# Patient Record
Sex: Male | Born: 1955 | Race: White | Hispanic: No | Marital: Single | Smoking: Never smoker
Health system: Southern US, Community
[De-identification: ages and names within clinical notes are randomized; demographics above are authoritative.]

## PROBLEM LIST (undated history)

## (undated) DIAGNOSIS — M109 Gout, unspecified: Secondary | ICD-10-CM

## (undated) DIAGNOSIS — E782 Mixed hyperlipidemia: Secondary | ICD-10-CM

## (undated) DIAGNOSIS — T7840XA Allergy, unspecified, initial encounter: Secondary | ICD-10-CM

## (undated) DIAGNOSIS — K759 Inflammatory liver disease, unspecified: Secondary | ICD-10-CM

## (undated) DIAGNOSIS — K219 Gastro-esophageal reflux disease without esophagitis: Secondary | ICD-10-CM

## (undated) DIAGNOSIS — H269 Unspecified cataract: Secondary | ICD-10-CM

## (undated) DIAGNOSIS — E876 Hypokalemia: Secondary | ICD-10-CM

## (undated) DIAGNOSIS — E559 Vitamin D deficiency, unspecified: Secondary | ICD-10-CM

## (undated) DIAGNOSIS — I10 Essential (primary) hypertension: Secondary | ICD-10-CM

## (undated) DIAGNOSIS — E119 Type 2 diabetes mellitus without complications: Secondary | ICD-10-CM

## (undated) DIAGNOSIS — Q5320 Undescended testicle, unspecified, bilateral: Secondary | ICD-10-CM

## (undated) DIAGNOSIS — Q613 Polycystic kidney, unspecified: Secondary | ICD-10-CM

## (undated) DIAGNOSIS — C801 Malignant (primary) neoplasm, unspecified: Secondary | ICD-10-CM

## (undated) DIAGNOSIS — I499 Cardiac arrhythmia, unspecified: Secondary | ICD-10-CM

## (undated) DIAGNOSIS — Q446 Cystic disease of liver: Secondary | ICD-10-CM

## (undated) HISTORY — DX: Essential (primary) hypertension: I10

## (undated) HISTORY — DX: Mixed hyperlipidemia: E78.2

## (undated) HISTORY — DX: Gastro-esophageal reflux disease without esophagitis: K21.9

## (undated) HISTORY — DX: Allergy, unspecified, initial encounter: T78.40XA

## (undated) HISTORY — PX: OTHER SURGICAL HISTORY: SHX169

## (undated) HISTORY — DX: Gout, unspecified: M10.9

## (undated) HISTORY — DX: Type 2 diabetes mellitus without complications: E11.9

## (undated) HISTORY — DX: Hypokalemia: E87.6

## (undated) HISTORY — DX: Vitamin D deficiency, unspecified: E55.9

## (undated) HISTORY — DX: Unspecified cataract: H26.9

## (undated) HISTORY — DX: Polycystic kidney, unspecified: Q61.3

---

## 2004-05-16 ENCOUNTER — Ambulatory Visit: Payer: Self-pay | Admitting: Family Medicine

## 2009-11-14 ENCOUNTER — Ambulatory Visit: Payer: Self-pay | Admitting: Gastroenterology

## 2010-07-20 HISTORY — PX: COLONOSCOPY: SHX174

## 2011-02-16 ENCOUNTER — Ambulatory Visit: Payer: Self-pay | Admitting: Family Medicine

## 2012-10-27 ENCOUNTER — Ambulatory Visit: Payer: Self-pay | Admitting: Family Medicine

## 2012-12-26 DIAGNOSIS — E669 Obesity, unspecified: Secondary | ICD-10-CM | POA: Insufficient documentation

## 2014-05-31 ENCOUNTER — Emergency Department: Payer: Self-pay | Admitting: Emergency Medicine

## 2014-10-31 ENCOUNTER — Ambulatory Visit
Admit: 2014-10-31 | Disposition: A | Discharge status: Home / Self Care | Payer: Self-pay | Attending: Family Medicine | Admitting: Family Medicine

## 2014-12-31 ENCOUNTER — Other Ambulatory Visit: Payer: 59

## 2014-12-31 DIAGNOSIS — Z7901 Long term (current) use of anticoagulants: Secondary | ICD-10-CM

## 2015-01-01 LAB — PROTIME-INR
INR: 3.2 — ABNORMAL HIGH (ref 0.8–1.2)
Prothrombin Time: 33.6 s — ABNORMAL HIGH (ref 9.1–12.0)

## 2015-01-14 ENCOUNTER — Other Ambulatory Visit: Payer: Self-pay | Admitting: Family Medicine

## 2015-01-14 DIAGNOSIS — E109 Type 1 diabetes mellitus without complications: Secondary | ICD-10-CM

## 2015-01-16 ENCOUNTER — Other Ambulatory Visit: Payer: Self-pay | Admitting: Family Medicine

## 2015-01-17 LAB — PROTIME-INR
INR: 2.3 — ABNORMAL HIGH (ref 0.8–1.2)
Prothrombin Time: 23.6 s — ABNORMAL HIGH (ref 9.1–12.0)

## 2015-01-24 ENCOUNTER — Other Ambulatory Visit: Payer: Self-pay | Admitting: Family Medicine

## 2015-01-24 DIAGNOSIS — I10 Essential (primary) hypertension: Secondary | ICD-10-CM

## 2015-02-14 ENCOUNTER — Other Ambulatory Visit: Payer: 59

## 2015-02-14 DIAGNOSIS — Z7901 Long term (current) use of anticoagulants: Secondary | ICD-10-CM

## 2015-02-15 LAB — PROTIME-INR
INR: 1.6 — ABNORMAL HIGH (ref 0.8–1.2)
Prothrombin Time: 16.3 s — ABNORMAL HIGH (ref 9.1–12.0)

## 2015-02-21 ENCOUNTER — Other Ambulatory Visit: Payer: Self-pay | Admitting: Family Medicine

## 2015-02-21 DIAGNOSIS — E785 Hyperlipidemia, unspecified: Secondary | ICD-10-CM

## 2015-02-21 DIAGNOSIS — M109 Gout, unspecified: Secondary | ICD-10-CM

## 2015-02-21 DIAGNOSIS — I10 Essential (primary) hypertension: Secondary | ICD-10-CM

## 2015-03-07 ENCOUNTER — Other Ambulatory Visit: Payer: Self-pay | Admitting: Family Medicine

## 2015-03-07 ENCOUNTER — Other Ambulatory Visit: Payer: 59

## 2015-03-08 LAB — PROTIME-INR
INR: 2.1 — ABNORMAL HIGH (ref 0.8–1.2)
Prothrombin Time: 21.3 s — ABNORMAL HIGH (ref 9.1–12.0)

## 2015-04-06 ENCOUNTER — Other Ambulatory Visit: Payer: Self-pay | Admitting: Family Medicine

## 2015-04-06 DIAGNOSIS — E109 Type 1 diabetes mellitus without complications: Secondary | ICD-10-CM

## 2015-04-08 ENCOUNTER — Other Ambulatory Visit: Payer: Self-pay | Admitting: Family Medicine

## 2015-04-09 LAB — PROTIME-INR
INR: 2.4 — AB (ref 0.8–1.2)
Prothrombin Time: 24.6 s — ABNORMAL HIGH (ref 9.1–12.0)

## 2015-04-14 ENCOUNTER — Other Ambulatory Visit: Payer: Self-pay | Admitting: Family Medicine

## 2015-04-14 DIAGNOSIS — I10 Essential (primary) hypertension: Secondary | ICD-10-CM

## 2015-04-30 ENCOUNTER — Other Ambulatory Visit: Payer: Self-pay | Admitting: Family Medicine

## 2015-05-01 LAB — PROTIME-INR
INR: 2.2 — ABNORMAL HIGH (ref 0.8–1.2)
PROTHROMBIN TIME: 22.1 s — AB (ref 9.1–12.0)

## 2015-05-04 ENCOUNTER — Other Ambulatory Visit: Payer: Self-pay | Admitting: Family Medicine

## 2015-05-08 ENCOUNTER — Other Ambulatory Visit: Payer: Self-pay | Admitting: Family Medicine

## 2015-05-25 ENCOUNTER — Other Ambulatory Visit: Payer: Self-pay | Admitting: Family Medicine

## 2015-06-04 ENCOUNTER — Other Ambulatory Visit: Payer: Self-pay | Admitting: Family Medicine

## 2015-06-04 LAB — PROTIME-INR
INR: 2.9 — AB (ref 0.8–1.2)
PROTHROMBIN TIME: 29.5 s — AB (ref 9.1–12.0)

## 2015-06-13 ENCOUNTER — Other Ambulatory Visit: Payer: Self-pay | Admitting: Family Medicine

## 2015-06-20 ENCOUNTER — Other Ambulatory Visit: Payer: Self-pay | Admitting: Family Medicine

## 2015-06-21 LAB — PROTIME-INR
INR: 2.4 — ABNORMAL HIGH (ref 0.8–1.2)
PROTHROMBIN TIME: 24.4 s — AB (ref 9.1–12.0)

## 2015-06-26 ENCOUNTER — Other Ambulatory Visit: Payer: Self-pay | Admitting: Family Medicine

## 2015-07-04 ENCOUNTER — Other Ambulatory Visit: Payer: Self-pay | Admitting: Family Medicine

## 2015-07-17 ENCOUNTER — Other Ambulatory Visit: Payer: Self-pay | Admitting: Family Medicine

## 2015-07-18 ENCOUNTER — Encounter: Payer: Self-pay | Admitting: Family Medicine

## 2015-07-18 ENCOUNTER — Ambulatory Visit (INDEPENDENT_AMBULATORY_CARE_PROVIDER_SITE_OTHER): Payer: 59 | Admitting: Family Medicine

## 2015-07-18 VITALS — BP 124/80 | HR 64 | Ht 72.0 in | Wt 263.0 lb

## 2015-07-18 DIAGNOSIS — E119 Type 2 diabetes mellitus without complications: Secondary | ICD-10-CM | POA: Diagnosis not present

## 2015-07-18 DIAGNOSIS — I10 Essential (primary) hypertension: Secondary | ICD-10-CM | POA: Diagnosis not present

## 2015-07-18 DIAGNOSIS — Q613 Polycystic kidney, unspecified: Secondary | ICD-10-CM | POA: Diagnosis not present

## 2015-07-18 DIAGNOSIS — E790 Hyperuricemia without signs of inflammatory arthritis and tophaceous disease: Secondary | ICD-10-CM

## 2015-07-18 DIAGNOSIS — E669 Obesity, unspecified: Secondary | ICD-10-CM | POA: Diagnosis not present

## 2015-07-18 DIAGNOSIS — E785 Hyperlipidemia, unspecified: Secondary | ICD-10-CM

## 2015-07-18 DIAGNOSIS — I482 Chronic atrial fibrillation, unspecified: Secondary | ICD-10-CM

## 2015-07-18 DIAGNOSIS — E1122 Type 2 diabetes mellitus with diabetic chronic kidney disease: Secondary | ICD-10-CM | POA: Diagnosis not present

## 2015-07-18 DIAGNOSIS — N184 Chronic kidney disease, stage 4 (severe): Secondary | ICD-10-CM | POA: Diagnosis not present

## 2015-07-18 LAB — PROTIME-INR
INR: 1.7 — AB (ref 0.8–1.2)
Prothrombin Time: 17.4 s — ABNORMAL HIGH (ref 9.1–12.0)

## 2015-07-18 MED ORDER — TRIAMTERENE-HCTZ 37.5-25 MG PO TABS: 1.0000 | ORAL_TABLET | Freq: Every day | ORAL | Status: DC

## 2015-07-18 MED ORDER — LIRAGLUTIDE 18 MG/3ML ~~LOC~~ SOPN: PEN_INJECTOR | SUBCUTANEOUS | Status: DC

## 2015-07-18 MED ORDER — LOSARTAN POTASSIUM 100 MG PO TABS: 100.0000 mg | ORAL_TABLET | Freq: Every day | ORAL | Status: DC

## 2015-07-18 MED ORDER — WARFARIN SODIUM 2 MG PO TABS: 2.0000 mg | ORAL_TABLET | Freq: Every day | ORAL | Status: DC

## 2015-07-18 MED ORDER — CLONIDINE HCL 0.1 MG PO TABS: 0.1000 mg | ORAL_TABLET | Freq: Two times a day (BID) | ORAL | Status: DC

## 2015-07-18 MED ORDER — AMLODIPINE BESYLATE 10 MG PO TABS: 10.0000 mg | ORAL_TABLET | Freq: Every day | ORAL | Status: DC

## 2015-07-18 MED ORDER — ALLOPURINOL 100 MG PO TABS: 100.0000 mg | ORAL_TABLET | Freq: Every day | ORAL | Status: DC

## 2015-07-18 MED ORDER — LOVASTATIN 20 MG PO TABS: 20.0000 mg | ORAL_TABLET | Freq: Every day | ORAL | Status: DC

## 2015-07-18 MED ORDER — METOPROLOL TARTRATE 100 MG PO TABS: 100.0000 mg | ORAL_TABLET | Freq: Every day | ORAL | Status: DC

## 2015-07-18 NOTE — Progress Notes (Signed)
Name: Mark Bauer   MRN: [id number]    DOB: 1955-10-17   Date:07/18/2015       Progress Note  Subjective  Chief Complaint  Chief Complaint  Patient presents with  . Diabetes  . Hypertension  . Hyperlipidemia  . Atrial Fibrillation    takes coumadin    Diabetes He presents for his follow-up diabetic visit. He has type 2 diabetes mellitus. His disease course has been stable. There are no hypoglycemic associated symptoms. Pertinent negatives for hypoglycemia include no dizziness, headaches or nervousness/anxiousness. Pertinent negatives for diabetes include no blurred vision, no chest pain, no fatigue, no foot paresthesias, no foot ulcerations, no polydipsia, no polyphagia, no polyuria, no visual change, no weakness and no weight loss. There are no hypoglycemic complications. Symptoms are stable. Diabetic complications include nephropathy. Pertinent negatives for diabetic complications include no CVA, PVD or retinopathy. Risk factors for coronary artery disease include diabetes mellitus, dyslipidemia, hypertension and obesity. Current diabetic treatment includes diet (victoza). He is compliant with treatment all of the time. His weight is stable. He is following a generally healthy diet. He participates in exercise intermittently. His home blood glucose trend is fluctuating minimally. His breakfast blood glucose is taken between 8-9 am. His breakfast blood glucose range is generally 110-130 mg/dl. An ACE inhibitor/angiotensin II receptor blocker is being taken. He does not see a podiatrist.Eye exam is current.  Hypertension This is a chronic problem. The current episode started more than 1 year ago. The problem is unchanged. The problem is controlled. Pertinent negatives include no anxiety, blurred vision, chest pain, headaches, malaise/fatigue, neck pain, orthopnea, palpitations, PND or shortness of breath. There are no associated agents to hypertension. Risk factors for coronary artery disease  include obesity, dyslipidemia and diabetes mellitus. Past treatments include angiotensin blockers, diuretics, calcium channel blockers, beta blockers and central alpha agonists (clonidine). The current treatment provides moderate improvement. There are no compliance problems.  There is no history of angina, kidney disease, CAD/MI, CVA, heart failure, left ventricular hypertrophy, PVD, renovascular disease or retinopathy. Identifiable causes of hypertension include chronic renal disease. There is no history of a hypertension causing med.  Hyperlipidemia This is a chronic problem. The current episode started more than 1 year ago. The problem is controlled. Exacerbating diseases include chronic renal disease. Pertinent negatives include no chest pain, focal weakness, myalgias or shortness of breath. Current antihyperlipidemic treatment includes statins. The current treatment provides moderate improvement of lipids. There are no compliance problems.   Atrial Fibrillation Presents for follow-up visit. Symptoms include hypertension. Symptoms are negative for bradycardia, chest pain, dizziness, palpitations, shortness of breath and weakness. The symptoms have been stable. Past treatments include anticoagulant and rate control. Compliance with prior treatments has been good. Past medical history includes atrial fibrillation and hyperlipidemia.    No problem-specific assessment & plan notes found for this encounter.   Past Medical History  Diagnosis Date  . Polycystic kidney   . Diabetes mellitus without complication ([place])   . Gout   . Hypertension   . Vitamin D deficiency   . Hypokalemia     Past Surgical History  Procedure Laterality Date  . Colonoscopy  2012    hemorrhoids    Family History  Problem Relation Age of Onset  . Diabetes Mother     Social History   Social History  . Marital Status: Single    Spouse Name: N/A  . Number of Children: N/A  . Years of Education: N/A    Occupational History  .  Not on file.   Social History Main Topics  . Smoking status: Never Smoker   . Smokeless tobacco: Not on file  . Alcohol Use: No  . Drug Use: No  . Sexual Activity: No   Other Topics Concern  . Not on file   Social History Narrative  . No narrative on file    Allergies  Allergen Reactions  . Caffeine   . Pollen Extract Other (See Comments)    Sneezing, watery eyes, etc.     Review of Systems  Constitutional: Negative for fever, chills, weight loss, malaise/fatigue and fatigue.  HENT: Negative for ear discharge, ear pain and sore throat.   Eyes: Negative for blurred vision.  Respiratory: Negative for cough, sputum production, shortness of breath and wheezing.   Cardiovascular: Negative for chest pain, palpitations, orthopnea, leg swelling and PND.  Gastrointestinal: Negative for heartburn, nausea, abdominal pain, diarrhea, constipation, blood in stool and melena.  Genitourinary: Negative for dysuria, urgency, frequency and hematuria.  Musculoskeletal: Negative for myalgias, back pain, joint pain and neck pain.  Skin: Negative for rash.  Neurological: Negative for dizziness, tingling, sensory change, focal weakness, weakness and headaches.  Endo/Heme/Allergies: Negative for environmental allergies, polydipsia and polyphagia. Does not bruise/bleed easily.  Psychiatric/Behavioral: Negative for depression and suicidal ideas. The patient is not nervous/anxious and does not have insomnia.      Objective  Filed Vitals:   07/18/15 0933  BP: 124/80  Pulse: 64  Height: 6' (1.829 m)  Weight: 263 lb (119.296 kg)    Physical Exam  Constitutional: He is oriented to person, place, and time and well-developed, well-nourished, and in no distress.  HENT:  Head: Normocephalic.  Right Ear: External ear normal.  Left Ear: External ear normal.  Nose: Nose normal.  Mouth/Throat: Oropharynx is clear and moist.  Eyes: Conjunctivae and EOM are normal. Pupils  are equal, round, and reactive to light. Right eye exhibits no discharge. Left eye exhibits no discharge. No scleral icterus.  Neck: Normal range of motion. Neck supple. No JVD present. No tracheal deviation present. No thyromegaly present.  Cardiovascular: Normal rate, regular rhythm, normal heart sounds and intact distal pulses.  Exam reveals no gallop and no friction rub.   No murmur heard. Pulmonary/Chest: Breath sounds normal. No respiratory distress. He has no wheezes. He has no rales.  Abdominal: Soft. Bowel sounds are normal. He exhibits no mass. There is no hepatosplenomegaly. There is no tenderness. There is no rebound, no guarding and no CVA tenderness.  Musculoskeletal: Normal range of motion. He exhibits no edema or tenderness.  Lymphadenopathy:    He has no cervical adenopathy.  Neurological: He is alert and oriented to person, place, and time. He has normal sensation, normal strength, normal reflexes and intact cranial nerves. No cranial nerve deficit.  Skin: Skin is warm. No rash noted.  Psychiatric: Mood and affect normal.      Assessment & Plan  Problem List Items Addressed This Visit    None    Visit Diagnoses    Type 2 diabetes mellitus without complication, without long-term current use of insulin (HCC)    -  Primary    Relevant Medications    losartan (COZAAR) 100 MG tablet    lovastatin (MEVACOR) 20 MG tablet    Liraglutide (VICTOZA) 18 MG/3ML SOPN    Other Relevant Orders    Urine Microalbumin w/creat. ratio    HgB A1c    Essential hypertension        Relevant Medications  amLODipine (NORVASC) 10 MG tablet    cloNIDine (CATAPRES) 0.1 MG tablet    warfarin (JANTOVEN) 2 MG tablet    losartan (COZAAR) 100 MG tablet    lovastatin (MEVACOR) 20 MG tablet    metoprolol (LOPRESSOR) 100 MG tablet    triamterene-hydrochlorothiazide (MAXZIDE-25) 37.5-25 MG tablet    Chronic atrial fibrillation (HCC)        Relevant Medications    amLODipine (NORVASC) 10 MG  tablet    cloNIDine (CATAPRES) 0.1 MG tablet    warfarin (JANTOVEN) 2 MG tablet    losartan (COZAAR) 100 MG tablet    lovastatin (MEVACOR) 20 MG tablet    metoprolol (LOPRESSOR) 100 MG tablet    triamterene-hydrochlorothiazide (MAXZIDE-25) 37.5-25 MG tablet    Hyperlipidemia        Relevant Medications    amLODipine (NORVASC) 10 MG tablet    cloNIDine (CATAPRES) 0.1 MG tablet    warfarin (JANTOVEN) 2 MG tablet    losartan (COZAAR) 100 MG tablet    lovastatin (MEVACOR) 20 MG tablet    metoprolol (LOPRESSOR) 100 MG tablet    triamterene-hydrochlorothiazide (MAXZIDE-25) 37.5-25 MG tablet    Other Relevant Orders    Lipid Profile    CKD stage 4 due to type 2 diabetes mellitus (HCC)        Relevant Medications    losartan (COZAAR) 100 MG tablet    lovastatin (MEVACOR) 20 MG tablet    Liraglutide (VICTOZA) 18 MG/3ML SOPN    Polycystic kidney        Hyperuricemia        Relevant Medications    allopurinol (ZYLOPRIM) 100 MG tablet    Obesity        Relevant Medications    Liraglutide (VICTOZA) 18 MG/3ML SOPN         Dr. Deanna Jones [facility]  07/18/2015

## 2015-07-18 NOTE — Patient Instructions (Signed)
Calorie Counting for Weight Loss Calories are energy you get from the things you eat and drink. Your body uses this energy to keep you going throughout the day. The number of calories you eat affects your weight. When you eat more calories than your body needs, your body stores the extra calories as fat. When you eat fewer calories than your body needs, your body burns fat to get the energy it needs. Calorie counting means keeping track of how many calories you eat and drink each day. If you make sure to eat fewer calories than your body needs, you should lose weight. In order for calorie counting to work, you will need to eat the number of calories that are right for you in a day to lose a healthy amount of weight per week. A healthy amount of weight to lose per week is usually 1-2 lb (0.5-0.9 kg). A dietitian can determine how many calories you need in a day and give you suggestions on how to reach your calorie goal.  WHAT IS MY MY PLAN? My goal is to have __________ calories per day.  If I have this many calories per day, I should lose around __________ pounds per week. WHAT DO I NEED TO KNOW ABOUT CALORIE COUNTING? In order to meet your daily calorie goal, you will need to:  Find out how many calories are in each food you would like to eat. Try to do this before you eat.  Decide how much of the food you can eat.  Write down what you ate and how many calories it had. Doing this is called keeping a food log. WHERE DO I FIND CALORIE INFORMATION? The number of calories in a food can be found on a Nutrition Facts label. Note that all the information on a label is based on a specific serving of the food. If a food does not have a Nutrition Facts label, try to look up the calories online or ask your dietitian for help. HOW DO I DECIDE HOW MUCH TO EAT? To decide how much of the food you can eat, you will need to consider both the number of calories in one serving and the size of one serving. This  information can be found on the Nutrition Facts label. If a food does not have a Nutrition Facts label, look up the information online or ask your dietitian for help. Remember that calories are listed per serving. If you choose to have more than one serving of a food, you will have to multiply the calories per serving by the amount of servings you plan to eat. For example, the label on a package of bread might say that a serving size is 1 slice and that there are 90 calories in a serving. If you eat 1 slice, you will have eaten 90 calories. If you eat 2 slices, you will have eaten 180 calories. HOW DO I KEEP A FOOD LOG? After each meal, record the following information in your food log:  What you ate.  How much of it you ate.  How many calories it had.  Then, add up your calories. Keep your food log near you, such as in a small notebook in your pocket. Another option is to use a mobile app or website. Some programs will calculate calories for you and show you how many calories you have left each time you add an item to the log. WHAT ARE SOME CALORIE COUNTING TIPS?  Use your calories on foods   and drinks that will fill you up and not leave you hungry. Some examples of this include foods like nuts and nut butters, vegetables, lean proteins, and high-fiber foods (more than 5 g fiber per serving).  Eat nutritious foods and avoid empty calories. Empty calories are calories you get from foods or beverages that do not have many nutrients, such as candy and soda. It is better to have a nutritious high-calorie food (such as an avocado) than a food with few nutrients (such as a bag of chips).  Know how many calories are in the foods you eat most often. This way, you do not have to look up how many calories they have each time you eat them.  Look out for foods that may seem like low-calorie foods but are really high-calorie foods, such as baked goods, soda, and fat-free candy.  Pay attention to calories  in drinks. Drinks such as sodas, specialty coffee drinks, alcohol, and juices have a lot of calories yet do not fill you up. Choose low-calorie drinks like water and diet drinks.  Focus your calorie counting efforts on higher calorie items. Logging the calories in a garden salad that contains only vegetables is less important than calculating the calories in a milk shake.  Find a way of tracking calories that works for you. Get creative. Most people who are successful find ways to keep track of how much they eat in a day, even if they do not count every calorie. WHAT ARE SOME PORTION CONTROL TIPS?  Know how many calories are in a serving. This will help you know how many servings of a certain food you can have.  Use a measuring cup to measure serving sizes. This is helpful when you start out. With time, you will be able to estimate serving sizes for some foods.  Take some time to put servings of different foods on your favorite plates, bowls, and cups so you know what a serving looks like.  Try not to eat straight from a bag or box. Doing this can lead to overeating. Put the amount you would like to eat in a cup or on a plate to make sure you are eating the right portion.  Use smaller plates, glasses, and bowls to prevent overeating. This is a quick and easy way to practice portion control. If your plate is smaller, less food can fit on it.  Try not to multitask while eating, such as watching TV or using your computer. If it is time to eat, sit down at a table and enjoy your food. Doing this will help you to start recognizing when you are full. It will also make you more aware of what and how much you are eating. HOW CAN I CALORIE COUNT WHEN EATING OUT?  Ask for smaller portion sizes or child-sized portions.  Consider sharing an entree and sides instead of getting your own entree.  If you get your own entree, eat only half. Ask for a box at the beginning of your meal and put the rest of your  entree in it so you are not tempted to eat it.  Look for the calories on the menu. If calories are listed, choose the lower calorie options.  Choose dishes that include vegetables, fruits, whole grains, low-fat dairy products, and lean protein. Focusing on smart food choices from each of the 5 food groups can help you stay on track at restaurants.  Choose items that are boiled, broiled, grilled, or steamed.  Choose   water, milk, unsweetened iced tea, or other drinks without added sugars. If you want an alcoholic beverage, choose a lower calorie option. For example, a regular margarita can have up to 700 calories and a glass of wine has around 150.  Stay away from items that are buttered, battered, fried, or served with cream sauce. Items labeled "crispy" are usually fried, unless stated otherwise.  Ask for dressings, sauces, and syrups on the side. These are usually very high in calories, so do not eat much of them.  Watch out for salads. Many people think salads are a healthy option, but this is often not the case. Many salads come with bacon, fried chicken, lots of cheese, fried chips, and dressing. All of these items have a lot of calories. If you want a salad, choose a garden salad and ask for grilled meats or steak. Ask for the dressing on the side, or ask for olive oil and vinegar or lemon to use as dressing.  Estimate how many servings of a food you are given. For example, a serving of cooked rice is  cup or about the size of half a tennis ball or one cupcake wrapper. Knowing serving sizes will help you be aware of how much food you are eating at restaurants. The list below tells you how big or small some common portion sizes are based on everyday objects.  1 oz--4 stacked dice.  3 oz--1 deck of cards.  1 tsp--1 dice.  1 Tbsp-- a Ping-Pong ball.  2 Tbsp--1 Ping-Pong ball.   cup--1 tennis ball or 1 cupcake wrapper.  1 cup--1 baseball.   This information is not intended to  replace advice given to you by your health care provider. Make sure you discuss any questions you have with your health care provider.   Document Released: 07/06/2005 Document Revised: 07/27/2014 Document Reviewed: 05/11/2013 Elsevier Interactive Patient Education 2016 [employer]. Diabetic Retinopathy Diabetic retinopathy is a disease of the light-sensitive membrane at the back of the eye (retina). It is a complication of diabetes and a common cause of blindness. Early detection of the disease is key to keeping your eyes healthy.  CAUSES  Diabetic retinopathy is caused by blood sugar (glucose) levels that are too high over an extended period of time. High blood sugars cause damage to the small blood vessels of the retina, allowing blood to leak through the vessel walls. This causes visual impairment and eventually blindness. RISK FACTORS  High blood pressure.  Having diabetes for a long time.  Having poorly controlled blood sugars. SIGNS AND SYMPTOMS  In the early stages of diabetic retinopathy, there are often no symptoms. As the condition advances, symptoms may include:  Blurred vision. This is usually caused by a swelling due to abnormal blood glucose levels. The blurriness may go away when blood glucose levels return to normal.  Moving specks or dark spots (floaters) in your vision. These can be caused by a small retinal hemorrhage. A hemorrhage is bleeding from blood vessels.  Missing parts of your field of vision, such as things at the side. This can be caused by larger retinal hemorrhages.  Difficulty reading books or signs.  Double vision.  Pain in one or both eyes.  Feeling pressure in one or both eyes.  Trouble seeing straight lines. Straight lines do not look straight.  Redness of the eyes that does not go away. DIAGNOSIS  Your eye care specialist can detect changes in the blood vessels of your eye by putting drops  in your eyes that enlarge (dilate) your pupils. This  allows your eye care specialist to get a good look at your retina to see if there are any changes that have occurred as a result of your diabetes. You should have your eyes examined once a year. TREATMENT  Your eye care specialist may use a special laser beam to seal the blood vessels of the retina and stop them from leaking. Early detection and treatment are important so that further damage to your eyes can be prevented. In addition, managing your blood sugars and keeping them in the target range can slow the progress of the disease. HOME CARE INSTRUCTIONS   Keep your blood pressure within your target range.  Keep your blood glucose levels within your target range.  Follow your health care provider's instructions regarding diet and other means for controlling your blood glucose levels.  Check your blood levels for glucose as recommended by your health care provider.  Keep regular appointments with your eye specialist. An eye specialist can usually see diabetic retinopathy developing long before it starts causing problems. In many cases, it can be treated to prevent complications from occurring. If you have diabetes, you should have your eyes checked at least every year. Your risk of retinopathy increases the longer you have the disease.  If you smoke, quit. Ask your health care provider for help if needed. Smoking can make retinopathy worse. SEEK MEDICAL CARE IF:   You notice gradual blurring or other changes in your vision over time.  You notice that your glasses or contact lenses do not make things look as sharp as they once did.  You have trouble reading or seeing details at a distance with either eye.  You notice a sudden change in your vision or notice that parts of your field of vision appear missing or hazy.  You suddenly see moving specks or dark spots in the field of vision of either eye.  You have sudden partial loss of vision in either eye.   This information is not intended  to replace advice given to you by your health care provider. Make sure you discuss any questions you have with your health care provider.   Document Released: 07/03/2000 Document Revised: 04/26/2013 Document Reviewed: 12/26/2012 Elsevier Interactive Patient Education [employer].

## 2015-07-19 LAB — HEMOGLOBIN A1C
ESTIMATED AVERAGE GLUCOSE: 140 mg/dL
Hgb A1c MFr Bld: 6.5 % — ABNORMAL HIGH (ref 4.8–5.6)

## 2015-07-19 LAB — LIPID PANEL
CHOLESTEROL TOTAL: 117 mg/dL (ref 100–199)
Chol/HDL Ratio: 3.8 ratio units (ref 0.0–5.0)
HDL: 31 mg/dL — AB (ref >39)
LDL Calculated: 64 mg/dL (ref 0–99)
TRIGLYCERIDES: 112 mg/dL (ref 0–149)
VLDL CHOLESTEROL CAL: 22 mg/dL (ref 5–40)

## 2015-07-19 LAB — MICROALBUMIN / CREATININE URINE RATIO
CREATININE, UR: 91.1 mg/dL
MICROALB/CREAT RATIO: 72 mg/g creat — ABNORMAL HIGH (ref 0.0–30.0)
Microalbumin, Urine: 65.6 ug/mL

## 2015-07-30 ENCOUNTER — Other Ambulatory Visit: Payer: Self-pay | Admitting: Family Medicine

## 2015-07-30 ENCOUNTER — Other Ambulatory Visit: Payer: Self-pay

## 2015-07-31 ENCOUNTER — Other Ambulatory Visit: Payer: Self-pay

## 2015-07-31 MED ORDER — PANTOPRAZOLE SODIUM 40 MG PO TBEC: 40.0000 mg | DELAYED_RELEASE_TABLET | Freq: Every day | ORAL | Status: DC

## 2015-08-20 ENCOUNTER — Other Ambulatory Visit: Payer: Self-pay | Admitting: Family Medicine

## 2015-08-21 LAB — PROTIME-INR
INR: 2.2 — AB (ref 0.8–1.2)
PROTHROMBIN TIME: 22.9 s — AB (ref 9.1–12.0)

## 2015-09-23 ENCOUNTER — Other Ambulatory Visit: Payer: Self-pay | Admitting: Family Medicine

## 2015-09-24 LAB — PROTIME-INR
INR: 2.7 — AB (ref 0.8–1.2)
PROTHROMBIN TIME: 27.8 s — AB (ref 9.1–12.0)

## 2015-10-28 ENCOUNTER — Other Ambulatory Visit: Payer: Self-pay | Admitting: Family Medicine

## 2015-10-29 LAB — PROTIME-INR
INR: 2.5 — AB (ref 0.8–1.2)
PROTHROMBIN TIME: 25.1 s — AB (ref 9.1–12.0)

## 2015-11-25 ENCOUNTER — Other Ambulatory Visit: Payer: Self-pay

## 2015-11-26 ENCOUNTER — Other Ambulatory Visit: Payer: Self-pay | Admitting: Family Medicine

## 2015-11-27 LAB — PROTIME-INR
INR: 3.7 — AB (ref 0.8–1.2)
PROTHROMBIN TIME: 37.1 s — AB (ref 9.1–12.0)

## 2015-12-09 ENCOUNTER — Other Ambulatory Visit: Payer: Self-pay

## 2015-12-10 ENCOUNTER — Other Ambulatory Visit: Payer: Self-pay | Admitting: Family Medicine

## 2015-12-11 LAB — RENAL FUNCTION PANEL
Albumin: 4.2 g/dL (ref 3.6–4.8)
BUN/Creatinine Ratio: 15 (ref 10–24)
BUN: 36 mg/dL — ABNORMAL HIGH (ref 8–27)
CO2: 23 mmol/L (ref 18–29)
CREATININE: 2.42 mg/dL — AB (ref 0.76–1.27)
Calcium: 10.1 mg/dL (ref 8.6–10.2)
Chloride: 98 mmol/L (ref 96–106)
GFR, EST AFRICAN AMERICAN: 32 mL/min/{1.73_m2} — AB (ref >59)
GFR, EST NON AFRICAN AMERICAN: 28 mL/min/{1.73_m2} — AB (ref >59)
GLUCOSE: 111 mg/dL — AB (ref 65–99)
PHOSPHORUS: 3 mg/dL (ref 2.5–4.5)
Potassium: 3.7 mmol/L (ref 3.5–5.2)
SODIUM: 140 mmol/L (ref 134–144)

## 2015-12-11 LAB — LIPID PANEL WITH LDL/HDL RATIO
Cholesterol, Total: 113 mg/dL (ref 100–199)
HDL: 29 mg/dL — ABNORMAL LOW (ref >39)
LDL Calculated: 57 mg/dL (ref 0–99)
LDl/HDL Ratio: 2 ratio units (ref 0.0–3.6)
Triglycerides: 137 mg/dL (ref 0–149)
VLDL Cholesterol Cal: 27 mg/dL (ref 5–40)

## 2015-12-11 LAB — HEPATIC FUNCTION PANEL
ALT: 15 IU/L (ref 0–44)
AST: 18 IU/L (ref 0–40)
Alkaline Phosphatase: 59 IU/L (ref 39–117)
Bilirubin Total: 0.6 mg/dL (ref 0.0–1.2)
Bilirubin, Direct: 0.18 mg/dL (ref 0.00–0.40)
Total Protein: 6.3 g/dL (ref 6.0–8.5)

## 2015-12-11 LAB — PROTIME-INR
INR: 2.2 — ABNORMAL HIGH (ref 0.8–1.2)
Prothrombin Time: 22.8 s — ABNORMAL HIGH (ref 9.1–12.0)

## 2015-12-11 LAB — HGB A1C W/O EAG: Hgb A1c MFr Bld: 6.5 % — ABNORMAL HIGH (ref 4.8–5.6)

## 2015-12-15 ENCOUNTER — Other Ambulatory Visit: Payer: Self-pay | Admitting: Family Medicine

## 2015-12-27 ENCOUNTER — Ambulatory Visit (INDEPENDENT_AMBULATORY_CARE_PROVIDER_SITE_OTHER): Payer: 59 | Admitting: Family Medicine

## 2015-12-27 ENCOUNTER — Encounter: Payer: Self-pay | Admitting: Family Medicine

## 2015-12-27 VITALS — BP 128/72 | HR 80 | Temp 98.2°F | Wt 252.0 lb

## 2015-12-27 DIAGNOSIS — Z23 Encounter for immunization: Secondary | ICD-10-CM

## 2015-12-27 DIAGNOSIS — M7542 Impingement syndrome of left shoulder: Secondary | ICD-10-CM | POA: Diagnosis not present

## 2015-12-27 DIAGNOSIS — I10 Essential (primary) hypertension: Secondary | ICD-10-CM

## 2015-12-27 NOTE — Patient Instructions (Signed)

## 2015-12-27 NOTE — Progress Notes (Signed)
Name: Mark Bauer   MRN: [id number]    DOB: 03-04-1956   Date:12/27/2015       Progress Note  Subjective  Chief Complaint  Chief Complaint  Patient presents with  . Diabetes  . medication refills    Diabetes He presents for his follow-up diabetic visit. He has type 2 diabetes mellitus. His disease course has been stable. There are no hypoglycemic associated symptoms. Pertinent negatives for hypoglycemia include no dizziness, headaches, nervousness/anxiousness or sweats. Pertinent negatives for diabetes include no blurred vision, no chest pain, no fatigue, no foot paresthesias, no foot ulcerations, no polydipsia, no polyphagia, no polyuria, no visual change, no weakness and no weight loss. There are no hypoglycemic complications. Symptoms are stable. There are no diabetic complications. Pertinent negatives for diabetic complications include no CVA, PVD or retinopathy. Risk factors for coronary artery disease include hypertension, male sex and obesity. Current diabetic treatment includes oral agent (monotherapy). He is following a generally healthy diet. His breakfast blood glucose is taken between 7-8 am. His breakfast blood glucose range is generally 110-130 mg/dl. An ACE inhibitor/angiotensin II receptor blocker is being taken. He does not see a podiatrist.Eye exam is current (appt june 29th).  Hypertension This is a chronic problem. The current episode started more than 1 year ago. The problem has been gradually improving since onset. The problem is controlled. Pertinent negatives include no blurred vision, chest pain, headaches, malaise/fatigue, neck pain, palpitations, shortness of breath or sweats. There are no associated agents to hypertension. Past treatments include angiotensin blockers, beta blockers and diuretics. The current treatment provides moderate improvement. Hypertensive end-organ damage includes kidney disease. There is no history of angina, CAD/MI, CVA, heart failure, left  ventricular hypertrophy, PVD, renovascular disease or retinopathy. There is no history of chronic renal disease or a hypertension causing med.  Shoulder Pain  The pain is present in the left shoulder. This is a chronic problem. The current episode started more than 1 month ago. There has been no history of extremity trauma. The problem has been waxing and waning. The quality of the pain is described as aching. The pain is at a severity of 5/10. The pain is moderate. Associated symptoms include a limited range of motion. Pertinent negatives include no fever, inability to bear weight, itching, joint locking, joint swelling, numbness or tingling. He has tried acetaminophen for the symptoms.    No problem-specific assessment & plan notes found for this encounter.   Past Medical History  Diagnosis Date  . Polycystic kidney   . Diabetes mellitus without complication ([place])   . Gout   . Hypertension   . Vitamin D deficiency   . Hypokalemia     Past Surgical History  Procedure Laterality Date  . Colonoscopy  2012    hemorrhoids    Family History  Problem Relation Age of Onset  . Diabetes Mother   . Heart disease Father     Social History   Social History  . Marital Status: Single    Spouse Name: N/A  . Number of Children: N/A  . Years of Education: N/A   Occupational History  . Not on file.   Social History Main Topics  . Smoking status: Never Smoker   . Smokeless tobacco: Never Used  . Alcohol Use: No  . Drug Use: No  . Sexual Activity: No   Other Topics Concern  . Not on file   Social History Narrative    Allergies  Allergen Reactions  . Caffeine   .  Pollen Extract Other (See Comments)    Sneezing, watery eyes, etc.     Review of Systems  Constitutional: Negative for fever, chills, weight loss, malaise/fatigue and fatigue.  HENT: Negative for ear discharge, ear pain and sore throat.   Eyes: Negative for blurred vision.  Respiratory: Negative for cough,  sputum production, shortness of breath and wheezing.   Cardiovascular: Negative for chest pain, palpitations and leg swelling.  Gastrointestinal: Negative for heartburn, nausea, abdominal pain, diarrhea, constipation, blood in stool and melena.  Genitourinary: Negative for dysuria, urgency, frequency and hematuria.  Musculoskeletal: Negative for myalgias, back pain, joint pain and neck pain.  Skin: Negative for itching and rash.  Neurological: Negative for dizziness, tingling, sensory change, focal weakness, weakness, numbness and headaches.  Endo/Heme/Allergies: Negative for environmental allergies, polydipsia and polyphagia. Does not bruise/bleed easily.  Psychiatric/Behavioral: Negative for depression and suicidal ideas. The patient is not nervous/anxious and does not have insomnia.      Objective  Filed Vitals:   12/27/15 0854  BP: 128/72  Pulse: 80  Temp: 98.2 F (36.8 C)  Weight: 252 lb (114.306 kg)    Physical Exam  Constitutional: He is oriented to person, place, and time and well-developed, well-nourished, and in no distress.  HENT:  Head: Normocephalic.  Right Ear: External ear normal.  Left Ear: External ear normal.  Nose: Nose normal.  Mouth/Throat: Oropharynx is clear and moist.  Eyes: Conjunctivae and EOM are normal. Pupils are equal, round, and reactive to light. Right eye exhibits no discharge. Left eye exhibits no discharge. No scleral icterus.  Neck: Normal range of motion. Neck supple. No JVD present. No tracheal deviation present. No thyromegaly present.  Cardiovascular: Normal rate, regular rhythm, normal heart sounds and intact distal pulses.  Exam reveals no gallop and no friction rub.   No murmur heard. Pulmonary/Chest: Breath sounds normal. No respiratory distress. He has no wheezes. He has no rales.  Abdominal: Soft. Bowel sounds are normal. He exhibits no mass. There is no hepatosplenomegaly. There is no tenderness. There is no rebound, no guarding and  no CVA tenderness.  Musculoskeletal: He exhibits no edema or tenderness.  Lymphadenopathy:    He has no cervical adenopathy.  Neurological: He is alert and oriented to person, place, and time. He has normal sensation, normal strength, normal reflexes and intact cranial nerves. No cranial nerve deficit.  Skin: Skin is warm. No rash noted.  Psychiatric: Mood and affect normal.  Nursing note and vitals reviewed.     Assessment & Plan  Problem List Items Addressed This Visit    None    Visit Diagnoses    Essential hypertension    -  Primary    Impingement syndrome of left shoulder        Relevant Orders    Ambulatory referral to Orthopedic Surgery    Need for pneumococcal vaccination        Relevant Orders    Pneumococcal polysaccharide vaccine 23-valent greater than or equal to 2yo subcutaneous/IM (Completed)         Dr. Macon [facility]  12/27/2015

## 2015-12-28 ENCOUNTER — Other Ambulatory Visit: Payer: Self-pay | Admitting: Family Medicine

## 2016-01-04 ENCOUNTER — Other Ambulatory Visit: Payer: Self-pay | Admitting: Family Medicine

## 2016-01-08 DIAGNOSIS — M7582 Other shoulder lesions, left shoulder: Secondary | ICD-10-CM | POA: Insufficient documentation

## 2016-01-22 ENCOUNTER — Other Ambulatory Visit: Payer: Self-pay | Admitting: Family Medicine

## 2016-01-23 ENCOUNTER — Other Ambulatory Visit: Payer: Self-pay

## 2016-01-23 LAB — PROTIME-INR
INR: 1.7 — ABNORMAL HIGH (ref 0.8–1.2)
Prothrombin Time: 17 s — ABNORMAL HIGH (ref 9.1–12.0)

## 2016-02-11 ENCOUNTER — Other Ambulatory Visit: Payer: Self-pay | Admitting: Family Medicine

## 2016-02-12 LAB — PROTIME-INR
INR: 2.3 — ABNORMAL HIGH (ref 0.8–1.2)
PROTHROMBIN TIME: 23.6 s — AB (ref 9.1–12.0)

## 2016-03-16 ENCOUNTER — Other Ambulatory Visit: Payer: Self-pay | Admitting: Family Medicine

## 2016-03-17 LAB — PROTIME-INR
INR: 3 — ABNORMAL HIGH (ref 0.8–1.2)
Prothrombin Time: 29.9 s — ABNORMAL HIGH (ref 9.1–12.0)

## 2016-03-26 ENCOUNTER — Other Ambulatory Visit: Payer: Self-pay | Admitting: Family Medicine

## 2016-03-30 ENCOUNTER — Other Ambulatory Visit: Payer: Self-pay | Admitting: Family Medicine

## 2016-03-31 LAB — PROTIME-INR
INR: 2.2 — ABNORMAL HIGH (ref 0.8–1.2)
Prothrombin Time: 22.2 s — ABNORMAL HIGH (ref 9.1–12.0)

## 2016-04-06 ENCOUNTER — Other Ambulatory Visit: Payer: Self-pay | Admitting: Family Medicine

## 2016-04-18 ENCOUNTER — Other Ambulatory Visit: Payer: Self-pay | Admitting: Family Medicine

## 2016-05-05 ENCOUNTER — Other Ambulatory Visit: Payer: Self-pay | Admitting: Family Medicine

## 2016-05-06 LAB — PROTIME-INR
INR: 2.6 — ABNORMAL HIGH (ref 0.8–1.2)
PROTHROMBIN TIME: 25.9 s — AB (ref 9.1–12.0)

## 2016-05-26 ENCOUNTER — Other Ambulatory Visit: Payer: Self-pay | Admitting: Family Medicine

## 2016-06-09 ENCOUNTER — Other Ambulatory Visit: Payer: Self-pay

## 2016-06-14 ENCOUNTER — Other Ambulatory Visit: Payer: Self-pay | Admitting: Family Medicine

## 2016-06-15 ENCOUNTER — Other Ambulatory Visit: Payer: Self-pay | Admitting: Family Medicine

## 2016-06-29 ENCOUNTER — Ambulatory Visit (INDEPENDENT_AMBULATORY_CARE_PROVIDER_SITE_OTHER): Payer: 59 | Admitting: Family Medicine

## 2016-06-29 ENCOUNTER — Encounter: Payer: Self-pay | Admitting: Family Medicine

## 2016-06-29 VITALS — BP 120/62 | HR 64 | Ht 72.0 in | Wt 253.0 lb

## 2016-06-29 DIAGNOSIS — I10 Essential (primary) hypertension: Secondary | ICD-10-CM | POA: Diagnosis not present

## 2016-06-29 DIAGNOSIS — E119 Type 2 diabetes mellitus without complications: Secondary | ICD-10-CM | POA: Diagnosis not present

## 2016-06-29 DIAGNOSIS — I482 Chronic atrial fibrillation, unspecified: Secondary | ICD-10-CM

## 2016-06-29 DIAGNOSIS — E782 Mixed hyperlipidemia: Secondary | ICD-10-CM | POA: Diagnosis not present

## 2016-06-29 DIAGNOSIS — E1121 Type 2 diabetes mellitus with diabetic nephropathy: Secondary | ICD-10-CM | POA: Diagnosis not present

## 2016-06-29 DIAGNOSIS — E790 Hyperuricemia without signs of inflammatory arthritis and tophaceous disease: Secondary | ICD-10-CM | POA: Diagnosis not present

## 2016-06-29 MED ORDER — LOVASTATIN 20 MG PO TABS: 20.0000 mg | ORAL_TABLET | Freq: Every day | ORAL | 1 refills | Status: DC

## 2016-06-29 MED ORDER — LIRAGLUTIDE 18 MG/3ML ~~LOC~~ SOPN: PEN_INJECTOR | SUBCUTANEOUS | 3 refills | Status: DC

## 2016-06-29 MED ORDER — WARFARIN SODIUM 5 MG PO TABS: 5.0000 mg | ORAL_TABLET | Freq: Every day | ORAL | 1 refills | Status: DC

## 2016-06-29 MED ORDER — PANTOPRAZOLE SODIUM 40 MG PO TBEC: 40.0000 mg | DELAYED_RELEASE_TABLET | Freq: Every day | ORAL | 1 refills | Status: DC

## 2016-06-29 MED ORDER — TRIAMTERENE-HCTZ 37.5-25 MG PO TABS: 1.0000 | ORAL_TABLET | Freq: Every day | ORAL | 0 refills | Status: DC

## 2016-06-29 MED ORDER — AMLODIPINE BESYLATE 10 MG PO TABS: 10.0000 mg | ORAL_TABLET | Freq: Every day | ORAL | 1 refills | Status: DC

## 2016-06-29 MED ORDER — ALLOPURINOL 100 MG PO TABS: 100.0000 mg | ORAL_TABLET | Freq: Every day | ORAL | 1 refills | Status: DC

## 2016-06-29 MED ORDER — WARFARIN SODIUM 2 MG PO TABS: 2.0000 mg | ORAL_TABLET | Freq: Every day | ORAL | 1 refills | Status: DC

## 2016-06-29 MED ORDER — INSULIN PEN NEEDLE 32G X 6 MM MISC: 1.0000 | Freq: Every day | 1 refills | Status: DC

## 2016-06-29 MED ORDER — METOPROLOL SUCCINATE ER 100 MG PO TB24: 100.0000 mg | ORAL_TABLET | Freq: Every day | ORAL | 1 refills | Status: DC

## 2016-06-29 MED ORDER — LOSARTAN POTASSIUM 100 MG PO TABS: 100.0000 mg | ORAL_TABLET | Freq: Every day | ORAL | 1 refills | Status: DC

## 2016-06-29 NOTE — Progress Notes (Signed)
Name: Mark Bauer   MRN: [id number]    DOB: 08/31/1955   Date:06/29/2016       Progress Note  Subjective  Chief Complaint  Chief Complaint  Patient presents with  . Diabetes  . Hyperlipidemia  . Hypertension  . Gout  . Atrial Fibrillation    Diabetes  He presents for his follow-up diabetic visit. He has type 2 diabetes mellitus. His disease course has been stable. There are no hypoglycemic associated symptoms. Pertinent negatives for hypoglycemia include no dizziness, headaches, nervousness/anxiousness or sweats. There are no diabetic associated symptoms. Pertinent negatives for diabetes include no blurred vision, no chest pain, no polydipsia and no weight loss. There are no hypoglycemic complications. Symptoms are stable. There are no diabetic complications. Pertinent negatives for diabetic complications include no CVA, PVD or retinopathy. There are no known risk factors for coronary artery disease. Current diabetic treatment includes oral agent (monotherapy). He is compliant with treatment all of the time. His weight is stable. He is following a generally healthy diet. When asked about meal planning, he reported none. He participates in exercise daily. His home blood glucose trend is fluctuating minimally. His breakfast blood glucose is taken between 8-9 am. His breakfast blood glucose range is generally 110-130 mg/dl. An ACE inhibitor/angiotensin II receptor blocker is not being taken. He does not see a podiatrist.Eye exam is current.  Hyperlipidemia  The current episode started more than 1 year ago. The problem is controlled. Recent lipid tests were reviewed and are normal. Exacerbating diseases include diabetes and hypothyroidism. He has no history of chronic renal disease, liver disease, obesity or nephrotic syndrome. There are no known factors aggravating his hyperlipidemia. Pertinent negatives include no chest pain, focal sensory loss, focal weakness, leg pain, myalgias or shortness of  breath. He is currently on no antihyperlipidemic treatment. The current treatment provides no improvement of lipids. There are no compliance problems.  There are no known risk factors for coronary artery disease.  Hypertension  This is a chronic problem. The current episode started 1 to 4 weeks ago. The problem has been resolved since onset. The problem is controlled. Pertinent negatives include no anxiety, blurred vision, chest pain, headaches, malaise/fatigue, neck pain, orthopnea, palpitations, peripheral edema, PND, shortness of breath or sweats. There are no associated agents to hypertension. There are no known risk factors for coronary artery disease. Past treatments include beta blockers, calcium channel blockers and diuretics. The current treatment provides mild improvement. There are no compliance problems.  There is no history of angina, kidney disease, CAD/MI, CVA, heart failure, left ventricular hypertrophy, PVD, renovascular disease or retinopathy. There is no history of chronic renal disease or a hypertension causing med.  Atrial Fibrillation  Presents for follow-up visit. Symptoms include hypertension. Symptoms are negative for an AICD problem, chest pain, dizziness, palpitations, shortness of breath and tachycardia. The symptoms have been stable. Past medical history includes atrial fibrillation and hyperlipidemia.    No problem-specific Assessment & Plan notes found for this encounter.   Past Medical History:  Diagnosis Date  . Diabetes mellitus without complication ([place])   . Gout   . Hypertension   . Hypokalemia   . Polycystic kidney   . Vitamin D deficiency     Past Surgical History:  Procedure Laterality Date  . COLONOSCOPY  2012   hemorrhoids    Family History  Problem Relation Age of Onset  . Diabetes Mother   . Heart disease Father     Social History   Social  History  . Marital status: Single    Spouse name: N/A  . Number of children: N/A  . Years of  education: N/A   Occupational History  . Not on file.   Social History Main Topics  . Smoking status: Never Smoker  . Smokeless tobacco: Never Used  . Alcohol use No  . Drug use: No  . Sexual activity: No   Other Topics Concern  . Not on file   Social History Narrative  . No narrative on file    Allergies  Allergen Reactions  . Caffeine   . Pollen Extract Other (See Comments)    Sneezing, watery eyes, etc.     Review of Systems  Constitutional: Negative for chills, fever, malaise/fatigue and weight loss.  HENT: Negative for ear discharge, ear pain and sore throat.   Eyes: Negative for blurred vision.  Respiratory: Negative for cough, sputum production, shortness of breath and wheezing.   Cardiovascular: Negative for chest pain, palpitations, orthopnea, leg swelling and PND.  Gastrointestinal: Negative for abdominal pain, blood in stool, constipation, diarrhea, heartburn, melena and nausea.  Genitourinary: Negative for dysuria, frequency, hematuria and urgency.  Musculoskeletal: Negative for back pain, joint pain, myalgias and neck pain.  Skin: Negative for rash.  Neurological: Negative for dizziness, tingling, sensory change, focal weakness and headaches.  Endo/Heme/Allergies: Negative for environmental allergies and polydipsia. Does not bruise/bleed easily.  Psychiatric/Behavioral: Negative for depression and suicidal ideas. The patient is not nervous/anxious and does not have insomnia.      Objective  Vitals:   06/29/16 1013  BP: 120/62  Pulse: 64  Weight: 253 lb (114.8 kg)  Height: 6' (1.829 m)    Physical Exam  Constitutional: He is oriented to person, place, and time and well-developed, well-nourished, and in no distress.  HENT:  Head: Normocephalic.  Right Ear: External ear normal.  Left Ear: External ear normal.  Nose: Nose normal.  Mouth/Throat: Oropharynx is clear and moist.  Eyes: Conjunctivae and EOM are normal. Pupils are equal, round, and  reactive to light. Right eye exhibits no discharge. Left eye exhibits no discharge. No scleral icterus.  Neck: Normal range of motion. Neck supple. No JVD present. No tracheal deviation present. No thyromegaly present.  Cardiovascular: Normal rate, regular rhythm, normal heart sounds and intact distal pulses.  Exam reveals no gallop and no friction rub.   No murmur heard. Pulmonary/Chest: Breath sounds normal. No respiratory distress. He has no wheezes. He has no rales.  Abdominal: Soft. Bowel sounds are normal. He exhibits no mass. There is no hepatosplenomegaly. There is no tenderness. There is no rebound, no guarding and no CVA tenderness.  Musculoskeletal: Normal range of motion. He exhibits no edema or tenderness.  Lymphadenopathy:    He has no cervical adenopathy.  Neurological: He is alert and oriented to person, place, and time. He has normal sensation, normal strength, normal reflexes and intact cranial nerves. No cranial nerve deficit.  Skin: Skin is warm. No rash noted.  Psychiatric: Mood and affect normal.  Nursing note and vitals reviewed.     Assessment & Plan  Problem List Items Addressed This Visit    None    Visit Diagnoses    Essential hypertension    -  Primary   Relevant Medications   warfarin (JANTOVEN) 2 MG tablet   amLODipine (NORVASC) 10 MG tablet   losartan (COZAAR) 100 MG tablet   lovastatin (MEVACOR) 20 MG tablet   metoprolol succinate (TOPROL-XL) 100 MG 24 hr tablet  triamterene-hydrochlorothiazide (MAXZIDE-25) 37.5-25 MG tablet   warfarin (COUMADIN) 5 MG tablet   Other Relevant Orders   Renal Function Panel   Type 2 diabetes mellitus with diabetic nephropathy, unspecified long term insulin use status (HCC)       Relevant Medications   liraglutide (VICTOZA) 18 MG/3ML SOPN   losartan (COZAAR) 100 MG tablet   lovastatin (MEVACOR) 20 MG tablet   Other Relevant Orders   Hemoglobin A1c   Hyperuricemia       Chronic atrial fibrillation (HCC)        Relevant Medications   warfarin (JANTOVEN) 2 MG tablet   amLODipine (NORVASC) 10 MG tablet   losartan (COZAAR) 100 MG tablet   lovastatin (MEVACOR) 20 MG tablet   metoprolol succinate (TOPROL-XL) 100 MG 24 hr tablet   triamterene-hydrochlorothiazide (MAXZIDE-25) 37.5-25 MG tablet   warfarin (COUMADIN) 5 MG tablet   Type 2 diabetes mellitus without complication, without long-term current use of insulin (HCC)       Relevant Medications   liraglutide (VICTOZA) 18 MG/3ML SOPN   losartan (COZAAR) 100 MG tablet   lovastatin (MEVACOR) 20 MG tablet   Mixed hyperlipidemia       Relevant Medications   warfarin (JANTOVEN) 2 MG tablet   amLODipine (NORVASC) 10 MG tablet   losartan (COZAAR) 100 MG tablet   lovastatin (MEVACOR) 20 MG tablet   metoprolol succinate (TOPROL-XL) 100 MG 24 hr tablet   triamterene-hydrochlorothiazide (MAXZIDE-25) 37.5-25 MG tablet   warfarin (COUMADIN) 5 MG tablet   Other Relevant Orders   Lipid Profile        Dr. Otilio Miu [facility]  06/29/16

## 2016-06-30 LAB — LIPID PANEL
CHOL/HDL RATIO: 4 ratio (ref 0.0–5.0)
Cholesterol, Total: 127 mg/dL (ref 100–199)
HDL: 32 mg/dL — AB (ref >39)
LDL Calculated: 69 mg/dL (ref 0–99)
Triglycerides: 131 mg/dL (ref 0–149)
VLDL Cholesterol Cal: 26 mg/dL (ref 5–40)

## 2016-06-30 LAB — RENAL FUNCTION PANEL
Albumin: 4.3 g/dL (ref 3.6–4.8)
BUN / CREAT RATIO: 15 (ref 10–24)
BUN: 43 mg/dL — ABNORMAL HIGH (ref 8–27)
CALCIUM: 9.9 mg/dL (ref 8.6–10.2)
CHLORIDE: 102 mmol/L (ref 96–106)
CO2: 22 mmol/L (ref 18–29)
CREATININE: 2.86 mg/dL — AB (ref 0.76–1.27)
GFR calc Af Amer: 26 mL/min/{1.73_m2} — ABNORMAL LOW (ref >59)
GFR calc non Af Amer: 23 mL/min/{1.73_m2} — ABNORMAL LOW (ref >59)
GLUCOSE: 111 mg/dL — AB (ref 65–99)
PHOSPHORUS: 2.5 mg/dL (ref 2.5–4.5)
POTASSIUM: 3.7 mmol/L (ref 3.5–5.2)
SODIUM: 143 mmol/L (ref 134–144)

## 2016-06-30 LAB — HEMOGLOBIN A1C
ESTIMATED AVERAGE GLUCOSE: 123 mg/dL
HEMOGLOBIN A1C: 5.9 % — AB (ref 4.8–5.6)

## 2016-07-15 ENCOUNTER — Other Ambulatory Visit: Payer: Self-pay | Admitting: Family Medicine

## 2016-07-16 LAB — PROTIME-INR
INR: 3 — ABNORMAL HIGH (ref 0.8–1.2)
Prothrombin Time: 29.5 s — ABNORMAL HIGH (ref 9.1–12.0)

## 2016-08-18 ENCOUNTER — Other Ambulatory Visit: Payer: Self-pay | Admitting: Family Medicine

## 2016-08-19 LAB — PROTIME-INR
INR: 2.7 — ABNORMAL HIGH (ref 0.8–1.2)
Prothrombin Time: 26.6 s — ABNORMAL HIGH (ref 9.1–12.0)

## 2016-09-03 ENCOUNTER — Other Ambulatory Visit: Payer: Self-pay | Admitting: Family Medicine

## 2016-09-04 ENCOUNTER — Other Ambulatory Visit: Payer: Self-pay | Admitting: Family Medicine

## 2016-09-17 ENCOUNTER — Other Ambulatory Visit: Payer: Self-pay | Admitting: Family Medicine

## 2016-09-18 LAB — PROTIME-INR
INR: 2.7 — AB (ref 0.8–1.2)
PROTHROMBIN TIME: 27.2 s — AB (ref 9.1–12.0)

## 2016-10-07 ENCOUNTER — Other Ambulatory Visit: Payer: Self-pay | Admitting: Family Medicine

## 2016-10-20 ENCOUNTER — Other Ambulatory Visit: Payer: Self-pay | Admitting: Family Medicine

## 2016-10-20 ENCOUNTER — Other Ambulatory Visit: Payer: Self-pay

## 2016-10-21 LAB — PROTIME-INR
INR: 3.3 — AB (ref 0.8–1.2)
PROTHROMBIN TIME: 32.1 s — AB (ref 9.1–12.0)

## 2016-11-03 ENCOUNTER — Other Ambulatory Visit: Payer: Self-pay | Admitting: Family Medicine

## 2016-11-04 LAB — PROTIME-INR
INR: 2.9 — ABNORMAL HIGH (ref 0.8–1.2)
PROTHROMBIN TIME: 28.2 s — AB (ref 9.1–12.0)

## 2016-11-23 ENCOUNTER — Other Ambulatory Visit: Payer: Self-pay | Admitting: Family Medicine

## 2016-12-16 ENCOUNTER — Other Ambulatory Visit: Payer: Self-pay | Admitting: Family Medicine

## 2016-12-17 LAB — LIPID PANEL
CHOLESTEROL TOTAL: 116 mg/dL (ref 100–199)
Chol/HDL Ratio: 3.7 ratio (ref 0.0–5.0)
HDL: 31 mg/dL — ABNORMAL LOW (ref >39)
LDL CALC: 57 mg/dL (ref 0–99)
TRIGLYCERIDES: 141 mg/dL (ref 0–149)
VLDL CHOLESTEROL CAL: 28 mg/dL (ref 5–40)

## 2016-12-17 LAB — HEPATIC FUNCTION PANEL
ALT: 18 IU/L (ref 0–44)
AST: 19 IU/L (ref 0–40)
Alkaline Phosphatase: 56 IU/L (ref 39–117)
Bilirubin Total: 0.6 mg/dL (ref 0.0–1.2)
Bilirubin, Direct: 0.16 mg/dL (ref 0.00–0.40)
Total Protein: 6.3 g/dL (ref 6.0–8.5)

## 2016-12-17 LAB — RENAL FUNCTION PANEL
ALBUMIN: 4.1 g/dL (ref 3.6–4.8)
BUN/Creatinine Ratio: 13 (ref 10–24)
BUN: 36 mg/dL — AB (ref 8–27)
CALCIUM: 9.7 mg/dL (ref 8.6–10.2)
CHLORIDE: 101 mmol/L (ref 96–106)
CO2: 25 mmol/L (ref 18–29)
CREATININE: 2.76 mg/dL — AB (ref 0.76–1.27)
GFR calc Af Amer: 27 mL/min/{1.73_m2} — ABNORMAL LOW (ref >59)
GFR calc non Af Amer: 24 mL/min/{1.73_m2} — ABNORMAL LOW (ref >59)
Glucose: 115 mg/dL — ABNORMAL HIGH (ref 65–99)
PHOSPHORUS: 2.8 mg/dL (ref 2.5–4.5)
Potassium: 3.6 mmol/L (ref 3.5–5.2)
Sodium: 140 mmol/L (ref 134–144)

## 2016-12-17 LAB — PROTIME-INR
INR: 3.1 — AB (ref 0.8–1.2)
PROTHROMBIN TIME: 30.4 s — AB (ref 9.1–12.0)

## 2016-12-17 LAB — HGB A1C W/O EAG: Hgb A1c MFr Bld: 6.1 % — ABNORMAL HIGH (ref 4.8–5.6)

## 2016-12-30 ENCOUNTER — Other Ambulatory Visit: Payer: Self-pay | Admitting: Family Medicine

## 2016-12-31 LAB — PROTIME-INR
INR: 2.9 — ABNORMAL HIGH (ref 0.8–1.2)
Prothrombin Time: 28.8 s — ABNORMAL HIGH (ref 9.1–12.0)

## 2017-01-06 ENCOUNTER — Encounter: Payer: Self-pay | Admitting: Family Medicine

## 2017-01-06 ENCOUNTER — Ambulatory Visit (INDEPENDENT_AMBULATORY_CARE_PROVIDER_SITE_OTHER): Payer: 59 | Admitting: Family Medicine

## 2017-01-06 VITALS — BP 110/62 | HR 80 | Ht 72.0 in | Wt 256.0 lb

## 2017-01-06 DIAGNOSIS — K219 Gastro-esophageal reflux disease without esophagitis: Secondary | ICD-10-CM | POA: Insufficient documentation

## 2017-01-06 DIAGNOSIS — E119 Type 2 diabetes mellitus without complications: Secondary | ICD-10-CM | POA: Insufficient documentation

## 2017-01-06 DIAGNOSIS — I482 Chronic atrial fibrillation, unspecified: Secondary | ICD-10-CM | POA: Insufficient documentation

## 2017-01-06 DIAGNOSIS — E790 Hyperuricemia without signs of inflammatory arthritis and tophaceous disease: Secondary | ICD-10-CM

## 2017-01-06 DIAGNOSIS — E782 Mixed hyperlipidemia: Secondary | ICD-10-CM | POA: Insufficient documentation

## 2017-01-06 DIAGNOSIS — I10 Essential (primary) hypertension: Secondary | ICD-10-CM | POA: Diagnosis not present

## 2017-01-06 MED ORDER — PANTOPRAZOLE SODIUM 40 MG PO TBEC: 40.0000 mg | DELAYED_RELEASE_TABLET | Freq: Every day | ORAL | 1 refills | Status: DC

## 2017-01-06 MED ORDER — TRIAMTERENE-HCTZ 37.5-25 MG PO TABS: 1.0000 | ORAL_TABLET | Freq: Every day | ORAL | 1 refills | Status: DC

## 2017-01-06 MED ORDER — INSULIN PEN NEEDLE 32G X 6 MM MISC: 1.0000 | Freq: Every day | 1 refills | Status: DC

## 2017-01-06 MED ORDER — LOVASTATIN 20 MG PO TABS: 20.0000 mg | ORAL_TABLET | Freq: Every day | ORAL | 1 refills | Status: DC

## 2017-01-06 MED ORDER — METOPROLOL SUCCINATE ER 100 MG PO TB24: 100.0000 mg | ORAL_TABLET | Freq: Every day | ORAL | 1 refills | Status: DC

## 2017-01-06 MED ORDER — WARFARIN SODIUM 5 MG PO TABS: 5.0000 mg | ORAL_TABLET | Freq: Every day | ORAL | 1 refills | Status: DC

## 2017-01-06 MED ORDER — AMLODIPINE BESYLATE 10 MG PO TABS: 10.0000 mg | ORAL_TABLET | Freq: Every day | ORAL | 1 refills | Status: DC

## 2017-01-06 MED ORDER — WARFARIN SODIUM 2 MG PO TABS: 2.0000 mg | ORAL_TABLET | Freq: Every day | ORAL | 1 refills | Status: DC

## 2017-01-06 MED ORDER — ALLOPURINOL 100 MG PO TABS: 100.0000 mg | ORAL_TABLET | Freq: Every day | ORAL | 1 refills | Status: DC

## 2017-01-06 MED ORDER — LIRAGLUTIDE 18 MG/3ML ~~LOC~~ SOPN: PEN_INJECTOR | SUBCUTANEOUS | 4 refills | Status: DC

## 2017-01-06 MED ORDER — LOSARTAN POTASSIUM 100 MG PO TABS: 100.0000 mg | ORAL_TABLET | Freq: Every day | ORAL | 1 refills | Status: DC

## 2017-01-06 NOTE — Progress Notes (Signed)
Name: Mark Bauer   MRN: [id number]    DOB: [birth year]   Date:01/06/2017       Progress Note  Subjective  Chief Complaint  Chief Complaint  Patient presents with  . Diabetes  . Hypertension  . Gastroesophageal Reflux  . Hyperlipidemia  . hypokalemia  . Atrial Fibrillation    Diabetes  He presents for his follow-up diabetic visit. He has type 2 diabetes mellitus. His disease course has been stable. There are no hypoglycemic associated symptoms. Pertinent negatives for hypoglycemia include no dizziness, headaches or nervousness/anxiousness. Pertinent negatives for diabetes include no blurred vision, no chest pain, no fatigue, no foot paresthesias, no foot ulcerations, no polydipsia, no polyphagia, no polyuria, no visual change, no weakness and no weight loss. There are no hypoglycemic complications. Symptoms are stable. Diabetic complications include nephropathy. Pertinent negatives for diabetic complications include no autonomic neuropathy, CVA, heart disease, impotence, peripheral neuropathy, PVD or retinopathy. Risk factors for coronary artery disease include diabetes mellitus, hypertension and obesity. Current diabetic treatments: victoza. His weight is stable. He is following a generally healthy diet. He participates in exercise three times a week. His home blood glucose trend is fluctuating minimally. His breakfast blood glucose is taken between 8-9 am. His breakfast blood glucose range is generally 110-130 mg/dl. An ACE inhibitor/angiotensin II receptor blocker is being taken. He does not see a podiatrist.Eye exam is current.  Hypertension  This is a chronic problem. The current episode started more than 1 year ago. The problem is unchanged. The problem is controlled. Pertinent negatives include no blurred vision, chest pain, headaches, malaise/fatigue, neck pain, palpitations or shortness of breath. There are no associated agents to hypertension. There are no known risk factors for  coronary artery disease. Past treatments include angiotensin blockers, calcium channel blockers, beta blockers and diuretics. There are no compliance problems.  There is no history of CVA, PVD or retinopathy. There is no history of chronic renal disease, a hypertension causing med or renovascular disease.  Gastroesophageal Reflux  He complains of heartburn. He reports no abdominal pain, no chest pain, no coughing, no dysphagia, no nausea, no sore throat, no stridor or no wheezing. This is a new problem. The current episode started more than 1 year ago. The problem occurs rarely. The problem has been unchanged. The symptoms are aggravated by certain foods. Pertinent negatives include no fatigue, melena or weight loss. He has tried a PPI for the symptoms. The treatment provided moderate relief.  Hyperlipidemia  This is a chronic problem. The problem is controlled. Recent lipid tests were reviewed and are normal. Exacerbating diseases include obesity. He has no history of chronic renal disease, diabetes, hypothyroidism, liver disease or nephrotic syndrome. There are no known factors aggravating his hyperlipidemia. Pertinent negatives include no chest pain, focal weakness, myalgias or shortness of breath. Current antihyperlipidemic treatment includes statins. The current treatment provides moderate improvement of lipids. There are no compliance problems.   Atrial Fibrillation  Presents for follow-up visit. Symptoms include hypertension. Symptoms are negative for bradycardia, chest pain, dizziness, palpitations, shortness of breath, tachycardia and weakness. The symptoms have been stable. Past medical history includes atrial fibrillation and hyperlipidemia.    No problem-specific Assessment & Plan notes found for this encounter.   Past Medical History:  Diagnosis Date  . Diabetes mellitus without complication ([place])   . Gout   . Hypertension   . Hypokalemia   . Polycystic kidney   . Vitamin D deficiency      Past Surgical History:  Procedure Laterality Date  . COLONOSCOPY  2012   hemorrhoids    Family History  Problem Relation Age of Onset  . Diabetes Mother   . Heart disease Father     Social History   Social History  . Marital status: Single    Spouse name: N/A  . Number of children: N/A  . Years of education: N/A   Occupational History  . Not on file.   Social History Main Topics  . Smoking status: Never Smoker  . Smokeless tobacco: Never Used  . Alcohol use No  . Drug use: No  . Sexual activity: No   Other Topics Concern  . Not on file   Social History Narrative  . No narrative on file    Allergies  Allergen Reactions  . Caffeine   . Pollen Extract Other (See Comments)    Sneezing, watery eyes, etc.    Outpatient Medications Prior to Visit  Medication Sig Dispense Refill  . Cholecalciferol (D3 SUPER STRENGTH) 2000 units CAPS Take by mouth.    . loratadine (CLARITIN) 10 MG tablet Take 10 mg by mouth daily.    Marland Kitchen allopurinol (ZYLOPRIM) 100 MG tablet TAKE 1 TABLET BY MOUTH  EVERY DAY 90 tablet 0  . amLODipine (NORVASC) 10 MG tablet TAKE 1 TABLET BY MOUTH  EVERY DAY 90 tablet 1  . Insulin Pen Needle (NOVOFINE) 32G X 6 MM MISC Inject 1 each as directed daily. Dx E11.9 (Patient taking differently: Inject 1 each as directed daily. Dx E11.9) 100 each 1  . liraglutide (VICTOZA) 18 MG/3ML SOPN INJECT 1.8MG  SUBCUTANEOUSLY ONCE DAILY 3 pen 3  . losartan (COZAAR) 100 MG tablet TAKE 1 TABLET BY MOUTH  EVERY DAY 90 tablet 0  . lovastatin (MEVACOR) 20 MG tablet TAKE 1 TABLET BY MOUTH  EVERY DAY 90 tablet 0  . metoprolol succinate (TOPROL-XL) 100 MG 24 hr tablet Take 1 tablet (100 mg total) by mouth daily. Take with or immediately following a meal. 90 tablet 1  . pantoprazole (PROTONIX) 40 MG tablet Take 1 tablet (40 mg total) by mouth daily. 90 tablet 1  . triamterene-hydrochlorothiazide (MAXZIDE-25) 37.5-25 MG tablet TAKE 1 TABLET BY MOUTH  EVERY DAY 90 tablet 0  .  warfarin (COUMADIN) 5 MG tablet Take 1 tablet (5 mg total) by mouth daily. 90 tablet 1  . warfarin (JANTOVEN) 2 MG tablet Take 1 tablet (2 mg total) by mouth daily. 90 tablet 1  . cloNIDine (CATAPRES) 0.1 MG tablet TAKE 1 TABLET BY MOUTH TWO  TIMES DAILY 180 tablet 0  . Potassium Chloride Crys CR (KLOR-CON M20 PO) Take 1 tablet by mouth daily. nephrology     No facility-administered medications prior to visit.     Review of Systems  Constitutional: Negative for chills, fatigue, fever, malaise/fatigue and weight loss.  HENT: Negative for ear discharge, ear pain and sore throat.   Eyes: Negative for blurred vision.  Respiratory: Negative for cough, sputum production, shortness of breath and wheezing.   Cardiovascular: Negative for chest pain, palpitations and leg swelling.  Gastrointestinal: Positive for heartburn. Negative for abdominal pain, blood in stool, constipation, diarrhea, dysphagia, melena and nausea.  Genitourinary: Negative for dysuria, frequency, hematuria, impotence and urgency.  Musculoskeletal: Negative for back pain, joint pain, myalgias and neck pain.  Skin: Negative for rash.  Neurological: Negative for dizziness, tingling, sensory change, focal weakness, weakness and headaches.  Endo/Heme/Allergies: Negative for environmental allergies, polydipsia and polyphagia. Does not bruise/bleed easily.  Psychiatric/Behavioral: Negative for depression  and suicidal ideas. The patient is not nervous/anxious and does not have insomnia.      Objective  Vitals:   01/06/17 1053  BP: 110/62  Pulse: 80  Weight: 256 lb (116.1 kg)  Height: 6' (1.829 m)    Physical Exam  Constitutional: He is oriented to person, place, and time and well-developed, well-nourished, and in no distress.  HENT:  Head: Normocephalic.  Right Ear: External ear normal.  Left Ear: External ear normal.  Nose: Nose normal.  Mouth/Throat: Oropharynx is clear and moist.  Eyes: Conjunctivae and EOM are  normal. Pupils are equal, round, and reactive to light. Right eye exhibits no discharge. Left eye exhibits no discharge. No scleral icterus.  Neck: Normal range of motion. Neck supple. No JVD present. No tracheal deviation present. No thyromegaly present.  Cardiovascular: Normal rate, regular rhythm, normal heart sounds and intact distal pulses.  Exam reveals no gallop and no friction rub.   No murmur heard. Pulmonary/Chest: Breath sounds normal. No respiratory distress. He has no wheezes. He has no rales.  Abdominal: Soft. Bowel sounds are normal. He exhibits no mass. There is no hepatosplenomegaly. There is no tenderness. There is no rebound, no guarding and no CVA tenderness.  Musculoskeletal: Normal range of motion. He exhibits no edema or tenderness.  Lymphadenopathy:    He has no cervical adenopathy.  Neurological: He is alert and oriented to person, place, and time. He has normal sensation, normal strength, normal reflexes and intact cranial nerves. No cranial nerve deficit.  Skin: Skin is warm. No rash noted.  Psychiatric: Mood and affect normal.  Nursing note and vitals reviewed.     Assessment & Plan  Problem List Items Addressed This Visit      Cardiovascular and Mediastinum   Chronic atrial fibrillation (HCC)   Relevant Medications   warfarin (JANTOVEN) 2 MG tablet   warfarin (COUMADIN) 5 MG tablet   triamterene-hydrochlorothiazide (MAXZIDE-25) 37.5-25 MG tablet   metoprolol succinate (TOPROL-XL) 100 MG 24 hr tablet   lovastatin (MEVACOR) 20 MG tablet   losartan (COZAAR) 100 MG tablet   amLODipine (NORVASC) 10 MG tablet   Essential hypertension - Primary   Relevant Medications   warfarin (JANTOVEN) 2 MG tablet   warfarin (COUMADIN) 5 MG tablet   triamterene-hydrochlorothiazide (MAXZIDE-25) 37.5-25 MG tablet   metoprolol succinate (TOPROL-XL) 100 MG 24 hr tablet   lovastatin (MEVACOR) 20 MG tablet   losartan (COZAAR) 100 MG tablet   amLODipine (NORVASC) 10 MG tablet      Digestive   Gastroesophageal reflux disease   Relevant Medications   pantoprazole (PROTONIX) 40 MG tablet     Endocrine   Type 2 diabetes mellitus without complication, without long-term current use of insulin (HCC)   Relevant Medications   liraglutide (VICTOZA) 18 MG/3ML SOPN   lovastatin (MEVACOR) 20 MG tablet   losartan (COZAAR) 100 MG tablet   Insulin Pen Needle (NOVOFINE) 32G X 6 MM MISC     Other   Hyperuricemia   Relevant Medications   allopurinol (ZYLOPRIM) 100 MG tablet   Mixed hyperlipidemia   Relevant Medications   warfarin (JANTOVEN) 2 MG tablet   warfarin (COUMADIN) 5 MG tablet   triamterene-hydrochlorothiazide (MAXZIDE-25) 37.5-25 MG tablet   metoprolol succinate (TOPROL-XL) 100 MG 24 hr tablet   lovastatin (MEVACOR) 20 MG tablet   losartan (COZAAR) 100 MG tablet   amLODipine (NORVASC) 10 MG tablet      Meds ordered this encounter  Medications  . warfarin (JANTOVEN) 2 MG  tablet    Sig: Take 1 tablet (2 mg total) by mouth daily.    Dispense:  90 tablet    Refill:  1  . liraglutide (VICTOZA) 18 MG/3ML SOPN    Sig: INJECT 1.8MG  SUBCUTANEOUSLY ONCE DAILY    Dispense:  3 pen    Refill:  4  . warfarin (COUMADIN) 5 MG tablet    Sig: Take 1 tablet (5 mg total) by mouth daily.    Dispense:  90 tablet    Refill:  1  . triamterene-hydrochlorothiazide (MAXZIDE-25) 37.5-25 MG tablet    Sig: Take 1 tablet by mouth daily.    Dispense:  90 tablet    Refill:  1  . pantoprazole (PROTONIX) 40 MG tablet    Sig: Take 1 tablet (40 mg total) by mouth daily.    Dispense:  90 tablet    Refill:  1  . metoprolol succinate (TOPROL-XL) 100 MG 24 hr tablet    Sig: Take 1 tablet (100 mg total) by mouth daily. Take with or immediately following a meal.    Dispense:  90 tablet    Refill:  1  . lovastatin (MEVACOR) 20 MG tablet    Sig: Take 1 tablet (20 mg total) by mouth daily.    Dispense:  90 tablet    Refill:  1  . losartan (COZAAR) 100 MG tablet    Sig: Take 1  tablet (100 mg total) by mouth daily.    Dispense:  90 tablet    Refill:  1  . Insulin Pen Needle (NOVOFINE) 32G X 6 MM MISC    Sig: Inject 1 each as directed daily. Dx E11.9    Dispense:  100 each    Refill:  1  . amLODipine (NORVASC) 10 MG tablet    Sig: Take 1 tablet (10 mg total) by mouth daily.    Dispense:  90 tablet    Refill:  1  . allopurinol (ZYLOPRIM) 100 MG tablet    Sig: Take 1 tablet (100 mg total) by mouth daily.    Dispense:  90 tablet    Refill:  1      Dr. [employer]  01/06/17

## 2017-01-19 LAB — HM DIABETES EYE EXAM

## 2017-02-01 ENCOUNTER — Other Ambulatory Visit: Payer: Self-pay | Admitting: Family Medicine

## 2017-02-02 LAB — PROTIME-INR
INR: 2.9 — ABNORMAL HIGH (ref 0.8–1.2)
Prothrombin Time: 28.7 s — ABNORMAL HIGH (ref 9.1–12.0)

## 2017-03-01 ENCOUNTER — Other Ambulatory Visit: Payer: Self-pay | Admitting: Family Medicine

## 2017-03-02 LAB — PROTIME-INR
INR: 2.9 — AB (ref 0.8–1.2)
Prothrombin Time: 28.1 s — ABNORMAL HIGH (ref 9.1–12.0)

## 2017-04-05 ENCOUNTER — Other Ambulatory Visit: Payer: Self-pay | Admitting: Family Medicine

## 2017-04-06 LAB — PROTIME-INR
INR: 2.9 — ABNORMAL HIGH (ref 0.8–1.2)
Prothrombin Time: 27.9 s — ABNORMAL HIGH (ref 9.1–12.0)

## 2017-05-03 ENCOUNTER — Other Ambulatory Visit: Payer: Self-pay | Admitting: Family Medicine

## 2017-05-04 LAB — PROTIME-INR
INR: 3 — AB (ref 0.8–1.2)
PROTHROMBIN TIME: 29.1 s — AB (ref 9.1–12.0)

## 2017-06-13 ENCOUNTER — Other Ambulatory Visit: Payer: Self-pay | Admitting: Family Medicine

## 2017-06-13 DIAGNOSIS — E119 Type 2 diabetes mellitus without complications: Secondary | ICD-10-CM

## 2017-06-17 ENCOUNTER — Other Ambulatory Visit: Payer: Self-pay | Admitting: Family Medicine

## 2017-06-18 LAB — PROTIME-INR
INR: 4.6 — AB (ref 0.8–1.2)
PROTHROMBIN TIME: 43.8 s — AB (ref 9.1–12.0)

## 2017-06-22 ENCOUNTER — Other Ambulatory Visit: Payer: Self-pay

## 2017-07-06 ENCOUNTER — Other Ambulatory Visit: Payer: Self-pay | Admitting: Family Medicine

## 2017-07-06 DIAGNOSIS — I10 Essential (primary) hypertension: Secondary | ICD-10-CM

## 2017-07-06 DIAGNOSIS — K219 Gastro-esophageal reflux disease without esophagitis: Secondary | ICD-10-CM

## 2017-07-08 ENCOUNTER — Other Ambulatory Visit: Payer: Self-pay | Admitting: Family Medicine

## 2017-07-09 LAB — PROTIME-INR
INR: 3.1 — ABNORMAL HIGH (ref 0.8–1.2)
PROTHROMBIN TIME: 30 s — AB (ref 9.1–12.0)

## 2017-07-15 ENCOUNTER — Other Ambulatory Visit: Payer: Self-pay

## 2017-07-26 ENCOUNTER — Ambulatory Visit (INDEPENDENT_AMBULATORY_CARE_PROVIDER_SITE_OTHER): Payer: 59 | Admitting: Family Medicine

## 2017-07-26 ENCOUNTER — Encounter: Payer: Self-pay | Admitting: Family Medicine

## 2017-07-26 VITALS — BP 110/80 | HR 64 | Ht 66.0 in | Wt 251.0 lb

## 2017-07-26 DIAGNOSIS — I482 Chronic atrial fibrillation, unspecified: Secondary | ICD-10-CM

## 2017-07-26 DIAGNOSIS — Z23 Encounter for immunization: Secondary | ICD-10-CM

## 2017-07-26 DIAGNOSIS — Z1159 Encounter for screening for other viral diseases: Secondary | ICD-10-CM | POA: Diagnosis not present

## 2017-07-26 DIAGNOSIS — K219 Gastro-esophageal reflux disease without esophagitis: Secondary | ICD-10-CM | POA: Diagnosis not present

## 2017-07-26 DIAGNOSIS — I10 Essential (primary) hypertension: Secondary | ICD-10-CM | POA: Diagnosis not present

## 2017-07-26 DIAGNOSIS — E790 Hyperuricemia without signs of inflammatory arthritis and tophaceous disease: Secondary | ICD-10-CM

## 2017-07-26 DIAGNOSIS — E119 Type 2 diabetes mellitus without complications: Secondary | ICD-10-CM

## 2017-07-26 DIAGNOSIS — E782 Mixed hyperlipidemia: Secondary | ICD-10-CM | POA: Diagnosis not present

## 2017-07-26 MED ORDER — TRIAMTERENE-HCTZ 37.5-25 MG PO TABS: 1.0000 | ORAL_TABLET | Freq: Every day | ORAL | 1 refills | Status: DC

## 2017-07-26 MED ORDER — LIRAGLUTIDE 18 MG/3ML ~~LOC~~ SOPN: PEN_INJECTOR | SUBCUTANEOUS | 0 refills | Status: DC

## 2017-07-26 MED ORDER — LOVASTATIN 20 MG PO TABS: 20.0000 mg | ORAL_TABLET | Freq: Every day | ORAL | 1 refills | Status: DC

## 2017-07-26 MED ORDER — WARFARIN SODIUM 2 MG PO TABS: 2.0000 mg | ORAL_TABLET | Freq: Every day | ORAL | 1 refills | Status: DC

## 2017-07-26 MED ORDER — LOSARTAN POTASSIUM 100 MG PO TABS: 100.0000 mg | ORAL_TABLET | Freq: Every day | ORAL | 1 refills | Status: DC

## 2017-07-26 MED ORDER — ALLOPURINOL 100 MG PO TABS: 100.0000 mg | ORAL_TABLET | Freq: Every day | ORAL | 1 refills | Status: DC

## 2017-07-26 MED ORDER — AMLODIPINE BESYLATE 10 MG PO TABS: 10.0000 mg | ORAL_TABLET | Freq: Every day | ORAL | 1 refills | Status: DC

## 2017-07-26 MED ORDER — WARFARIN SODIUM 5 MG PO TABS: 5.0000 mg | ORAL_TABLET | Freq: Every day | ORAL | 1 refills | Status: DC

## 2017-07-26 MED ORDER — INSULIN PEN NEEDLE 32G X 6 MM MISC: 1.0000 | Freq: Every day | 1 refills | Status: DC

## 2017-07-26 MED ORDER — METOPROLOL SUCCINATE ER 100 MG PO TB24: 100.0000 mg | ORAL_TABLET | Freq: Every day | ORAL | 1 refills | Status: DC

## 2017-07-26 MED ORDER — PANTOPRAZOLE SODIUM 40 MG PO TBEC: 40.0000 mg | DELAYED_RELEASE_TABLET | Freq: Every day | ORAL | 1 refills | Status: DC

## 2017-07-26 NOTE — Progress Notes (Signed)
Name: Mark Bauer   MRN: [id number]    DOB: 1956/04/24   Date:07/26/2017       Progress Note  Subjective  Chief Complaint  Chief Complaint  Patient presents with  . Hypertension  . Gout  . Diabetes  . Hyperlipidemia  . Gastroesophageal Reflux  . Atrial Fibrillation    Hypertension  This is a chronic problem. The current episode started more than 1 year ago. The problem is unchanged. The problem is controlled. Pertinent negatives include no anxiety, blurred vision, chest pain, headaches, malaise/fatigue, neck pain, orthopnea, palpitations, peripheral edema, PND, shortness of breath or sweats. There are no associated agents to hypertension. Risk factors for coronary artery disease include diabetes mellitus, dyslipidemia, obesity, male gender and post-menopausal state. Past treatments include ACE inhibitors, beta blockers, calcium channel blockers and diuretics. The current treatment provides moderate improvement. Hypertensive end-organ damage includes kidney disease. There is no history of angina, CAD/MI, CVA, heart failure, left ventricular hypertrophy, PVD or retinopathy. There is no history of chronic renal disease, coarctation of the aorta, hyperaldosteronism, hypercortisolism, a hypertension causing med or renovascular disease.  Diabetes  He presents for his follow-up diabetic visit. He has type 2 diabetes mellitus. His disease course has been stable. Pertinent negatives for hypoglycemia include no confusion, dizziness, headaches, hunger, mood changes, nervousness/anxiousness, pallor, seizures, sleepiness, speech difficulty, sweats or tremors. Pertinent negatives for diabetes include no blurred vision, no chest pain, no fatigue, no foot paresthesias, no foot ulcerations, no polydipsia, no polyphagia, no polyuria, no visual change, no weakness and no weight loss. Pertinent negatives for diabetic complications include no autonomic neuropathy, CVA, heart disease, impotence, nephropathy,  peripheral neuropathy, PVD or retinopathy. Risk factors for coronary artery disease include diabetes mellitus, dyslipidemia, hypertension, male sex and obesity. Current diabetic treatments: victoza. He is compliant with treatment all of the time. His weight is fluctuating minimally. He is following a generally healthy diet. Meal planning includes avoidance of concentrated sweets. He participates in exercise intermittently. His home blood glucose trend is fluctuating minimally. His breakfast blood glucose is taken between 8-9 am. His breakfast blood glucose range is generally 130-140 mg/dl. An ACE inhibitor/angiotensin II receptor blocker is being taken. He does not see a podiatrist.Eye exam is current.  Hyperlipidemia  This is a chronic problem. The current episode started more than 1 year ago. The problem is controlled. Recent lipid tests were reviewed and are normal. Exacerbating diseases include diabetes and obesity. He has no history of chronic renal disease, hypothyroidism or liver disease. Factors aggravating his hyperlipidemia include thiazides. Pertinent negatives include no chest pain, focal sensory loss, focal weakness, myalgias or shortness of breath. Current antihyperlipidemic treatment includes statins. The current treatment provides moderate improvement of lipids. There are no compliance problems.  Risk factors for coronary artery disease include diabetes mellitus, hypertension, dyslipidemia and male sex.  Gastroesophageal Reflux  He reports no abdominal pain, no chest pain, no coughing, no dysphagia, no heartburn, no nausea, no sore throat or no wheezing. This is a chronic problem. The problem occurs rarely. The symptoms are aggravated by lying down and certain foods. Pertinent negatives include no anemia, fatigue, melena, muscle weakness, orthopnea or weight loss. He has tried a PPI for the symptoms. The treatment provided moderate relief.  Atrial Fibrillation  Presents for follow-up visit.  Symptoms include hypertension. Symptoms are negative for bradycardia, chest pain, dizziness, hemodynamic instability, hypotension, pacemaker problem, palpitations, shortness of breath, syncope, tachycardia and weakness. The symptoms have been stable. Past medical history includes atrial fibrillation  and hyperlipidemia.    No problem-specific Assessment & Plan notes found for this encounter.   Past Medical History:  Diagnosis Date  . Diabetes mellitus without complication ([place])   . Gout   . Hypertension   . Hypokalemia   . Polycystic kidney   . Vitamin D deficiency     Past Surgical History:  Procedure Laterality Date  . COLONOSCOPY  2012   hemorrhoids    Family History  Problem Relation Age of Onset  . Diabetes Mother   . Heart disease Father     Social History   Socioeconomic History  . Marital status: Single    Spouse name: Not on file  . Number of children: Not on file  . Years of education: Not on file  . Highest education level: Not on file  Social Needs  . Financial resource strain: Not on file  . Food insecurity - worry: Not on file  . Food insecurity - inability: Not on file  . Transportation needs - medical: Not on file  . Transportation needs - non-medical: Not on file  Occupational History  . Not on file  Tobacco Use  . Smoking status: Never Smoker  . Smokeless tobacco: Never Used  Substance and Sexual Activity  . Alcohol use: No    Alcohol/week: 0.0 oz  . Drug use: No  . Sexual activity: No  Other Topics Concern  . Not on file  Social History Narrative  . Not on file    Allergies  Allergen Reactions  . Caffeine   . Pollen Extract Other (See Comments)    Sneezing, watery eyes, etc.    Outpatient Medications Prior to Visit  Medication Sig Dispense Refill  . Cholecalciferol (D3 SUPER STRENGTH) 2000 units CAPS Take by mouth.    . loratadine (CLARITIN) 10 MG tablet Take 10 mg by mouth daily. otc    . allopurinol (ZYLOPRIM) 100 MG tablet Take  1 tablet (100 mg total) by mouth daily. 90 tablet 1  . amLODipine (NORVASC) 10 MG tablet Take 1 tablet (10 mg total) by mouth daily. 90 tablet 1  . Insulin Pen Needle (NOVOFINE) 32G X 6 MM MISC Inject 1 each as directed daily. Dx E11.9 100 each 1  . liraglutide (VICTOZA) 18 MG/3ML SOPN INJECT 1.8MG  SUBCUTANEOUSLY ONCE DAILY 18 mL 0  . losartan (COZAAR) 100 MG tablet Take 1 tablet (100 mg total) by mouth daily. 90 tablet 1  . lovastatin (MEVACOR) 20 MG tablet Take 1 tablet (20 mg total) by mouth daily. 90 tablet 1  . metoprolol succinate (TOPROL-XL) 100 MG 24 hr tablet TAKE 1 TABLET BY MOUTH  DAILY WITH OR IMMEDIATLEY  FOLLOWING A MEAL 90 tablet 0  . pantoprazole (PROTONIX) 40 MG tablet TAKE 1 TABLET BY MOUTH  DAILY 90 tablet 0  . triamterene-hydrochlorothiazide (MAXZIDE-25) 37.5-25 MG tablet Take 1 tablet by mouth daily. 90 tablet 1  . warfarin (COUMADIN) 5 MG tablet Take 1 tablet (5 mg total) by mouth daily. 90 tablet 1  . warfarin (JANTOVEN) 2 MG tablet Take 1 tablet (2 mg total) by mouth daily. 90 tablet 1   No facility-administered medications prior to visit.     Review of Systems  Constitutional: Negative for chills, fatigue, fever, malaise/fatigue and weight loss.  HENT: Negative for ear discharge, ear pain and sore throat.   Eyes: Negative for blurred vision.  Respiratory: Negative for cough, sputum production, shortness of breath and wheezing.   Cardiovascular: Negative for chest pain, palpitations, orthopnea, leg  swelling, syncope and PND.  Gastrointestinal: Negative for abdominal pain, blood in stool, constipation, diarrhea, dysphagia, heartburn, melena and nausea.  Genitourinary: Negative for dysuria, frequency, hematuria, impotence and urgency.  Musculoskeletal: Negative for back pain, joint pain, myalgias, muscle weakness and neck pain.  Skin: Negative for pallor and rash.  Neurological: Negative for dizziness, tingling, tremors, sensory change, focal weakness, seizures, speech  difficulty, weakness and headaches.  Endo/Heme/Allergies: Negative for environmental allergies, polydipsia and polyphagia. Does not bruise/bleed easily.  Psychiatric/Behavioral: Negative for confusion, depression and suicidal ideas. The patient is not nervous/anxious and does not have insomnia.      Objective  Vitals:   07/26/17 0951  BP: 110/80  Pulse: 64  Weight: 251 lb (113.9 kg)  Height: 5\' 6"  (1.676 m)    Physical Exam  Constitutional: He is oriented to person, place, and time and well-developed, well-nourished, and in no distress.  HENT:  Head: Normocephalic.  Right Ear: External ear normal.  Left Ear: External ear normal.  Nose: Nose normal.  Mouth/Throat: Oropharynx is clear and moist.  Eyes: Conjunctivae and EOM are normal. Pupils are equal, round, and reactive to light. Right eye exhibits no discharge. Left eye exhibits no discharge. No scleral icterus.  Neck: Normal range of motion. Neck supple. No JVD present. No tracheal deviation present. No thyromegaly present.  Cardiovascular: Normal rate, regular rhythm, normal heart sounds and intact distal pulses. Exam reveals no gallop and no friction rub.  No murmur heard. Pulmonary/Chest: Breath sounds normal. No respiratory distress. He has no wheezes. He has no rales.  Abdominal: Soft. Bowel sounds are normal. He exhibits no mass. There is no hepatosplenomegaly. There is no tenderness. There is no rebound, no guarding and no CVA tenderness.  Genitourinary: Rectum normal and prostate normal. Rectal exam shows guaiac negative stool.  Musculoskeletal: Normal range of motion. He exhibits no edema or tenderness.  Lymphadenopathy:    He has no cervical adenopathy.  Neurological: He is alert and oriented to person, place, and time. He has normal sensation, normal strength, normal reflexes and intact cranial nerves. No cranial nerve deficit.  Monofilament normal  Skin: Skin is warm. No rash noted.  Psychiatric: Mood and affect  normal.  Nursing note and vitals reviewed.     Assessment & Plan  Problem List Items Addressed This Visit      Cardiovascular and Mediastinum   Chronic atrial fibrillation (HCC)   Relevant Medications   amLODipine (NORVASC) 10 MG tablet   losartan (COZAAR) 100 MG tablet   lovastatin (MEVACOR) 20 MG tablet   metoprolol succinate (TOPROL-XL) 100 MG 24 hr tablet   triamterene-hydrochlorothiazide (MAXZIDE-25) 37.5-25 MG tablet   warfarin (COUMADIN) 5 MG tablet   warfarin (JANTOVEN) 2 MG tablet   Other Relevant Orders   Protime-INR   Essential hypertension   Relevant Medications   amLODipine (NORVASC) 10 MG tablet   losartan (COZAAR) 100 MG tablet   lovastatin (MEVACOR) 20 MG tablet   metoprolol succinate (TOPROL-XL) 100 MG 24 hr tablet   triamterene-hydrochlorothiazide (MAXZIDE-25) 37.5-25 MG tablet   warfarin (COUMADIN) 5 MG tablet   warfarin (JANTOVEN) 2 MG tablet     Digestive   Gastroesophageal reflux disease   Relevant Medications   pantoprazole (PROTONIX) 40 MG tablet     Endocrine   Type 2 diabetes mellitus without complication, without long-term current use of insulin (HCC) - Primary   Relevant Medications   Insulin Pen Needle (NOVOFINE) 32G X 6 MM MISC   liraglutide (VICTOZA) 18 MG/3ML SOPN  losartan (COZAAR) 100 MG tablet   lovastatin (MEVACOR) 20 MG tablet     Other   Hyperuricemia   Relevant Medications   allopurinol (ZYLOPRIM) 100 MG tablet   Mixed hyperlipidemia   Relevant Medications   amLODipine (NORVASC) 10 MG tablet   losartan (COZAAR) 100 MG tablet   lovastatin (MEVACOR) 20 MG tablet   metoprolol succinate (TOPROL-XL) 100 MG 24 hr tablet   triamterene-hydrochlorothiazide (MAXZIDE-25) 37.5-25 MG tablet   warfarin (COUMADIN) 5 MG tablet   warfarin (JANTOVEN) 2 MG tablet    Other Visit Diagnoses    Need for hepatitis C screening test       Relevant Orders   Hepatitis C antibody   Need for diphtheria-tetanus-pertussis (Tdap) vaccine        Relevant Orders   Tdap vaccine greater than or equal to 7yo IM (Completed)      Meds ordered this encounter  Medications  . allopurinol (ZYLOPRIM) 100 MG tablet    Sig: Take 1 tablet (100 mg total) by mouth daily.    Dispense:  90 tablet    Refill:  1  . amLODipine (NORVASC) 10 MG tablet    Sig: Take 1 tablet (10 mg total) by mouth daily.    Dispense:  90 tablet    Refill:  1  . Insulin Pen Needle (NOVOFINE) 32G X 6 MM MISC    Sig: Inject 1 each as directed daily. Dx E11.9- please dispense #100 per 90 days!!!!    Dispense:  100 each    Refill:  1  . liraglutide (VICTOZA) 18 MG/3ML SOPN    Sig: INJECT 1.8MG  SUBCUTANEOUSLY ONCE DAILY    Dispense:  18 mL    Refill:  0    Send 9 pens to last 90 days- Dx E11.9  . losartan (COZAAR) 100 MG tablet    Sig: Take 1 tablet (100 mg total) by mouth daily.    Dispense:  90 tablet    Refill:  1  . lovastatin (MEVACOR) 20 MG tablet    Sig: Take 1 tablet (20 mg total) by mouth daily.    Dispense:  90 tablet    Refill:  1  . metoprolol succinate (TOPROL-XL) 100 MG 24 hr tablet    Sig: Take 1 tablet (100 mg total) by mouth daily. Take with or immediately following a meal.    Dispense:  90 tablet    Refill:  1  . pantoprazole (PROTONIX) 40 MG tablet    Sig: Take 1 tablet (40 mg total) by mouth daily.    Dispense:  90 tablet    Refill:  1  . triamterene-hydrochlorothiazide (MAXZIDE-25) 37.5-25 MG tablet    Sig: Take 1 tablet by mouth daily.    Dispense:  90 tablet    Refill:  1  . warfarin (COUMADIN) 5 MG tablet    Sig: Take 1 tablet (5 mg total) by mouth daily.    Dispense:  90 tablet    Refill:  1  . warfarin (JANTOVEN) 2 MG tablet    Sig: Take 1 tablet (2 mg total) by mouth daily.    Dispense:  90 tablet    Refill:  1      Dr. Otilio Miu [facility]  07/26/17

## 2017-07-27 LAB — HEPATITIS C ANTIBODY: Hep C Virus Ab: <0.1 s/co ratio (ref 0.0–0.9)

## 2017-07-27 LAB — PROTIME-INR
INR: 2.2 — ABNORMAL HIGH (ref 0.8–1.2)
Prothrombin Time: 21.8 s — ABNORMAL HIGH (ref 9.1–12.0)

## 2017-08-19 DIAGNOSIS — N2581 Secondary hyperparathyroidism of renal origin: Secondary | ICD-10-CM | POA: Insufficient documentation

## 2017-08-23 ENCOUNTER — Other Ambulatory Visit: Payer: 59

## 2017-08-23 DIAGNOSIS — Z7901 Long term (current) use of anticoagulants: Secondary | ICD-10-CM

## 2017-08-24 LAB — PROTIME-INR
INR: 3.2 — AB (ref 0.8–1.2)
Prothrombin Time: 31 s — ABNORMAL HIGH (ref 9.1–12.0)

## 2017-09-07 ENCOUNTER — Other Ambulatory Visit: Payer: Self-pay | Admitting: Family Medicine

## 2017-09-08 LAB — PROTIME-INR
INR: 2.5 — ABNORMAL HIGH (ref 0.8–1.2)
PROTHROMBIN TIME: 24.7 s — AB (ref 9.1–12.0)

## 2017-09-28 ENCOUNTER — Other Ambulatory Visit: Payer: Self-pay | Admitting: Family Medicine

## 2017-09-28 DIAGNOSIS — E119 Type 2 diabetes mellitus without complications: Secondary | ICD-10-CM

## 2017-10-04 ENCOUNTER — Other Ambulatory Visit: Payer: Self-pay | Admitting: Family Medicine

## 2017-10-05 LAB — PROTIME-INR
INR: 3.9 — ABNORMAL HIGH (ref 0.8–1.2)
PROTHROMBIN TIME: 37.3 s — AB (ref 9.1–12.0)

## 2017-10-25 ENCOUNTER — Other Ambulatory Visit: Payer: Self-pay | Admitting: Family Medicine

## 2017-10-26 LAB — PROTIME-INR
INR: 2.7 — AB (ref 0.8–1.2)
PROTHROMBIN TIME: 26.6 s — AB (ref 9.1–12.0)

## 2017-11-12 ENCOUNTER — Other Ambulatory Visit: Payer: Self-pay

## 2017-11-26 ENCOUNTER — Other Ambulatory Visit: Payer: Self-pay | Admitting: Family Medicine

## 2017-11-27 LAB — PROTIME-INR
INR: 2.7 — ABNORMAL HIGH (ref 0.8–1.2)
PROTHROMBIN TIME: 26.6 s — AB (ref 9.1–12.0)

## 2017-12-21 ENCOUNTER — Other Ambulatory Visit: Payer: Self-pay

## 2017-12-21 ENCOUNTER — Other Ambulatory Visit: Payer: Self-pay | Admitting: Family Medicine

## 2017-12-21 DIAGNOSIS — E119 Type 2 diabetes mellitus without complications: Secondary | ICD-10-CM

## 2017-12-21 LAB — HM DIABETES EYE EXAM

## 2017-12-21 NOTE — Progress Notes (Unsigned)
abstract

## 2017-12-28 ENCOUNTER — Other Ambulatory Visit: Payer: Self-pay | Admitting: Family Medicine

## 2017-12-29 LAB — PROTIME-INR
INR: 3.6 — ABNORMAL HIGH (ref 0.8–1.2)
PROTHROMBIN TIME: 34.5 s — AB (ref 9.1–12.0)

## 2018-01-11 ENCOUNTER — Other Ambulatory Visit: Payer: Self-pay | Admitting: Family Medicine

## 2018-01-11 ENCOUNTER — Other Ambulatory Visit: Payer: Self-pay

## 2018-01-12 LAB — LIPID PANEL W/O CHOL/HDL RATIO
Cholesterol, Total: 124 mg/dL (ref 100–199)
HDL: 40 mg/dL (ref >39)
LDL Calculated: 66 mg/dL (ref 0–99)
TRIGLYCERIDES: 92 mg/dL (ref 0–149)
VLDL Cholesterol Cal: 18 mg/dL (ref 5–40)

## 2018-01-12 LAB — RENAL FUNCTION PANEL
ALBUMIN: 4.4 g/dL (ref 3.6–4.8)
BUN/Creatinine Ratio: 19 (ref 10–24)
BUN: 62 mg/dL — AB (ref 8–27)
CO2: 20 mmol/L (ref 20–29)
CREATININE: 3.2 mg/dL — AB (ref 0.76–1.27)
Calcium: 10 mg/dL (ref 8.6–10.2)
Chloride: 105 mmol/L (ref 96–106)
GFR calc Af Amer: 22 mL/min/{1.73_m2} — ABNORMAL LOW (ref >59)
GFR, EST NON AFRICAN AMERICAN: 19 mL/min/{1.73_m2} — AB (ref >59)
Glucose: 137 mg/dL — ABNORMAL HIGH (ref 65–99)
Phosphorus: 3.5 mg/dL (ref 2.5–4.5)
Potassium: 3.5 mmol/L (ref 3.5–5.2)
Sodium: 142 mmol/L (ref 134–144)

## 2018-01-12 LAB — HEPATIC FUNCTION PANEL
ALT: 37 IU/L (ref 0–44)
AST: 30 IU/L (ref 0–40)
Alkaline Phosphatase: 63 IU/L (ref 39–117)
BILIRUBIN, DIRECT: 0.16 mg/dL (ref 0.00–0.40)
Bilirubin Total: 0.5 mg/dL (ref 0.0–1.2)
Total Protein: 6.6 g/dL (ref 6.0–8.5)

## 2018-01-12 LAB — PROTIME-INR
INR: 3 — AB (ref 0.8–1.2)
PROTHROMBIN TIME: 29.4 s — AB (ref 9.1–12.0)

## 2018-01-12 LAB — HGB A1C W/O EAG: Hgb A1c MFr Bld: 6 % — ABNORMAL HIGH (ref 4.8–5.6)

## 2018-01-25 ENCOUNTER — Ambulatory Visit (INDEPENDENT_AMBULATORY_CARE_PROVIDER_SITE_OTHER): Payer: 59 | Admitting: Family Medicine

## 2018-01-25 ENCOUNTER — Encounter: Payer: Self-pay | Admitting: Family Medicine

## 2018-01-25 VITALS — BP 120/70 | HR 64 | Ht 72.0 in | Wt 266.0 lb

## 2018-01-25 DIAGNOSIS — E1121 Type 2 diabetes mellitus with diabetic nephropathy: Secondary | ICD-10-CM | POA: Diagnosis not present

## 2018-01-25 DIAGNOSIS — N184 Chronic kidney disease, stage 4 (severe): Secondary | ICD-10-CM

## 2018-01-25 DIAGNOSIS — E1122 Type 2 diabetes mellitus with diabetic chronic kidney disease: Secondary | ICD-10-CM | POA: Insufficient documentation

## 2018-01-25 DIAGNOSIS — E790 Hyperuricemia without signs of inflammatory arthritis and tophaceous disease: Secondary | ICD-10-CM | POA: Diagnosis not present

## 2018-01-25 DIAGNOSIS — R1031 Right lower quadrant pain: Secondary | ICD-10-CM

## 2018-01-25 DIAGNOSIS — K219 Gastro-esophageal reflux disease without esophagitis: Secondary | ICD-10-CM

## 2018-01-25 DIAGNOSIS — E876 Hypokalemia: Secondary | ICD-10-CM

## 2018-01-25 DIAGNOSIS — I10 Essential (primary) hypertension: Secondary | ICD-10-CM

## 2018-01-25 DIAGNOSIS — E782 Mixed hyperlipidemia: Secondary | ICD-10-CM

## 2018-01-25 DIAGNOSIS — Q613 Polycystic kidney, unspecified: Secondary | ICD-10-CM

## 2018-01-25 MED ORDER — METOPROLOL SUCCINATE ER 100 MG PO TB24: 100.0000 mg | ORAL_TABLET | Freq: Every day | ORAL | 1 refills | Status: DC

## 2018-01-25 MED ORDER — LIRAGLUTIDE 18 MG/3ML ~~LOC~~ SOPN: PEN_INJECTOR | SUBCUTANEOUS | 0 refills | Status: DC

## 2018-01-25 MED ORDER — LOSARTAN POTASSIUM 100 MG PO TABS: 100.0000 mg | ORAL_TABLET | Freq: Every day | ORAL | 1 refills | Status: DC

## 2018-01-25 MED ORDER — ALLOPURINOL 100 MG PO TABS: 100.0000 mg | ORAL_TABLET | Freq: Every day | ORAL | 1 refills | Status: DC

## 2018-01-25 MED ORDER — INSULIN PEN NEEDLE 32G X 6 MM MISC: 1.0000 | Freq: Every day | 1 refills | Status: DC

## 2018-01-25 MED ORDER — LOVASTATIN 20 MG PO TABS: 20.0000 mg | ORAL_TABLET | Freq: Every day | ORAL | 1 refills | Status: DC

## 2018-01-25 MED ORDER — AMLODIPINE BESYLATE 10 MG PO TABS: 10.0000 mg | ORAL_TABLET | Freq: Every day | ORAL | 1 refills | Status: DC

## 2018-01-25 MED ORDER — TRIAMTERENE-HCTZ 37.5-25 MG PO TABS: 1.0000 | ORAL_TABLET | Freq: Every day | ORAL | 1 refills | Status: DC

## 2018-01-25 MED ORDER — PANTOPRAZOLE SODIUM 40 MG PO TBEC: 40.0000 mg | DELAYED_RELEASE_TABLET | Freq: Every day | ORAL | 1 refills | Status: DC

## 2018-01-25 NOTE — Assessment & Plan Note (Signed)
Controlled. Will continue pantoprazole 40 mg daily.

## 2018-01-25 NOTE — Assessment & Plan Note (Signed)
Chronic stable will continue victoza . A1C 6.0 Will continue to monitor with renal panel and A1C

## 2018-01-25 NOTE — Assessment & Plan Note (Signed)
Chronic Controlled. Will continue amlodipine 10 mg,losartin 100 mg, metoprolol 100 XL,and Triamterine 37.5/25 daily.

## 2018-01-25 NOTE — Assessment & Plan Note (Signed)
Uric acid level controlled with allopurinol 100mg  daily

## 2018-01-25 NOTE — Assessment & Plan Note (Signed)
Chronic Controlled Will continue lovastatin 20 mg daily and monitor lipid panel

## 2018-01-25 NOTE — Progress Notes (Signed)
Name: Mark Bauer   MRN: [id number]    DOB: Jan 06, 1956   Date:01/25/2018       Progress Note  Subjective  Chief Complaint  Chief Complaint  Patient presents with  . Diabetes    doesn't need meds right now  . Hyperlipidemia  . Hypertension  . Gout  . hypokalemia  . Atrial Fibrillation    takes coumadin for control  . Gastroesophageal Reflux  . Prostatitis    was treated with doxy  for 14 days. Urine frequency is better, but the pain in the groin has not subsided    Diabetes  He presents for his follow-up diabetic visit. He has type 2 diabetes mellitus. His disease course has been stable. There are no hypoglycemic associated symptoms. Pertinent negatives for hypoglycemia include no dizziness, headaches, nervousness/anxiousness or sweats. Pertinent negatives for diabetes include no blurred vision, no chest pain, no fatigue, no foot paresthesias, no foot ulcerations, no polydipsia, no polyphagia, no polyuria, no visual change, no weakness and no weight loss. There are no hypoglycemic complications. Symptoms are stable. There are no diabetic complications. Pertinent negatives for diabetic complications include no CVA, PVD or retinopathy. Risk factors for coronary artery disease include diabetes mellitus, dyslipidemia, hypertension, male sex and obesity. Current diabetic treatment includes oral agent (monotherapy). He is compliant with treatment all of the time. He is following a generally healthy diet. Meal planning includes avoidance of concentrated sweets and carbohydrate counting. He participates in exercise intermittently. His home blood glucose trend is fluctuating minimally. His breakfast blood glucose is taken between 8-9 am. His breakfast blood glucose range is generally 130-140 mg/dl. An ACE inhibitor/angiotensin II receptor blocker is being taken. Eye exam is current.  Hyperlipidemia  This is a chronic problem. The current episode started more than 1 year ago. The problem is  controlled. Recent lipid tests were reviewed and are normal. He has no history of chronic renal disease. Pertinent negatives include no chest pain, focal sensory loss, focal weakness, leg pain, myalgias or shortness of breath. Current antihyperlipidemic treatment includes statins. The current treatment provides moderate improvement of lipids. There are no compliance problems.  Risk factors for coronary artery disease include diabetes mellitus, dyslipidemia, male sex and hypertension.  Hypertension  This is a chronic problem. The current episode started more than 1 year ago. The problem has been waxing and waning since onset. The problem is controlled. Pertinent negatives include no blurred vision, chest pain, headaches, malaise/fatigue, neck pain, orthopnea, palpitations, peripheral edema, PND, shortness of breath or sweats. There are no associated agents to hypertension. Risk factors for coronary artery disease include diabetes mellitus, dyslipidemia and obesity. Past treatments include calcium channel blockers, diuretics, beta blockers and angiotensin blockers. There are no compliance problems.  There is no history of angina, kidney disease, CAD/MI, CVA, heart failure, left ventricular hypertrophy, PVD or retinopathy. There is no history of chronic renal disease, a hypertension causing med or renovascular disease.  Atrial Fibrillation  Presents for follow-up visit. Symptoms include hypertension. Symptoms are negative for an AICD problem, bradycardia, chest pain, dizziness, hemodynamic instability, hypotension, pacemaker problem, palpitations, shortness of breath, syncope, tachycardia and weakness. The symptoms have been stable. Past medical history includes atrial fibrillation and hyperlipidemia.  Gastroesophageal Reflux  He reports no abdominal pain, no belching, no chest pain, no choking, no coughing, no dysphagia, no early satiety, no globus sensation, no heartburn, no hoarse voice, no nausea, no sore  throat, no stridor, no tooth decay, no water brash or no wheezing. 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 The current episode started more than 1 year ago. The problem occurs frequently. The symptoms are aggravated by certain foods. Pertinent negatives include no anemia, fatigue, melena, muscle weakness, orthopnea or weight loss. There are no known risk factors. He has tried a PPI for the symptoms. The treatment provided moderate relief. Past procedures do not include an abdominal ultrasound, an EGD, esophageal manometry, esophageal pH monitoring, H. pylori antibody titer or a UGI.  Male GU Problem  The patient's primary symptoms include pelvic pain. The patient's pertinent negatives include no genital injury, genital itching, genital lesions, penile discharge, penile pain, priapism, scrotal swelling or testicular pain. Primary symptoms comment: Undescended testicles. This is a new problem. The current episode started more than 1 month ago (5/31). The problem occurs daily. The problem has been gradually improving (maintain discomfort ). The pain is medium. Associated symptoms include flank pain, frequency and urgency. Pertinent negatives include no abdominal pain, anorexia, chest pain, chills, constipation, coughing, diarrhea, discolored urine, dysuria, fever, headaches, hematuria, hesitancy, joint pain, joint swelling, nausea, rash, shortness of breath, sore throat, urinary retention or vomiting. Nothing (actually activity made it better) aggravates the symptoms. He has tried antibiotics for the symptoms. The treatment provided mild relief.    Type 2 diabetes mellitus with diabetic nephropathy, without long-term current use of insulin (HCC) Chronic stable will continue victoza . A1C 6.0 Will continue to monitor with renal panel and A1C  Gastroesophageal reflux disease Controlled. Will continue pantoprazole 40 mg daily.  CKD stage 4 due to type 2 diabetes mellitus ([place]) Followed with  nephrology. Will continue to monitor renal panel.  Essential hypertension Chronic Controlled. Will continue amlodipine 10 mg,losartin 100 mg, metoprolol 100 XL,and Triamterine 37.5/25 daily.  Mixed hyperlipidemia Chronic Controlled Will continue lovastatin 20 mg daily and monitor lipid panel  Hyperuricemia Uric acid level controlled with allopurinol 100mg  daily   Past Medical History:  Diagnosis Date  . Diabetes mellitus without complication ([place])   . Gout   . Hypertension   . Hypokalemia   . Polycystic kidney   . Vitamin D deficiency     Past Surgical History:  Procedure Laterality Date  . COLONOSCOPY  2012   hemorrhoids    Family History  Problem Relation Age of Onset  . Diabetes Mother   . Heart disease Father     Social History   Socioeconomic History  . Marital status: Single    Spouse name: Not on file  . Number of children: Not on file  . Years of education: Not on file  . Highest education level: Not on file  Occupational History  . Not on file  Social Needs  . Financial resource strain: Not on file  . Food insecurity:    Worry: Not on file    Inability: Not on file  . Transportation needs:    Medical: Not on file    Non-medical: Not on file  Tobacco Use  . Smoking status: Never Smoker  . Smokeless tobacco: Never Used  Substance and Sexual Activity  . Alcohol use: No    Alcohol/week: 0.0 oz  . Drug use: No  . Sexual activity: Never  Lifestyle  . Physical activity:    Days per week: Not on file    Minutes per session: Not on file  . Stress: Not on file  Relationships  . Social connections:    Talks on phone: Not on file    Gets together: Not on file  Attends religious service: Not on file    Active member of club or organization: Not on file    Attends meetings of clubs or organizations: Not on file    Relationship status: Not on file  . Intimate partner violence:    Fear of current or ex partner: Not on file    Emotionally abused:  Not on file    Physically abused: Not on file    Forced sexual activity: Not on file  Other Topics Concern  . Not on file  Social History Narrative  . Not on file    Allergies  Allergen Reactions  . Caffeine   . Pollen Extract Other (See Comments)    Sneezing, watery eyes, etc.    Outpatient Medications Prior to Visit  Medication Sig Dispense Refill  . Cholecalciferol (D3 SUPER STRENGTH) 2000 units CAPS Take 1 capsule by mouth as directed. M-Friday    . loratadine (CLARITIN) 10 MG tablet Take 10 mg by mouth daily. otc    . potassium chloride (K-DUR,KLOR-CON) 10 MEQ tablet Take 1 tablet by mouth 2 (two) times daily. Nephrology    . warfarin (COUMADIN) 4 MG tablet Take 1 tablet by mouth daily.    Marland Kitchen warfarin (COUMADIN) 5 MG tablet Take 1 tablet (5 mg total) by mouth daily. 90 tablet 1  . allopurinol (ZYLOPRIM) 100 MG tablet Take 1 tablet (100 mg total) by mouth daily. 90 tablet 1  . amLODipine (NORVASC) 10 MG tablet Take 1 tablet (10 mg total) by mouth daily. 90 tablet 1  . Insulin Pen Needle (NOVOFINE) 32G X 6 MM MISC Inject 1 each as directed daily. Dx E11.9- please dispense #100 per 90 days!!!! 100 each 1  . liraglutide (VICTOZA) 18 MG/3ML SOPN INJECT 1.8MG  SUBCUTANEOUSLY 1 TIME DAILY 27 mL 0  . losartan (COZAAR) 100 MG tablet Take 1 tablet (100 mg total) by mouth daily. 90 tablet 1  . lovastatin (MEVACOR) 20 MG tablet Take 1 tablet (20 mg total) by mouth daily. 90 tablet 1  . metoprolol succinate (TOPROL-XL) 100 MG 24 hr tablet Take 1 tablet (100 mg total) by mouth daily. Take with or immediately following a meal. 90 tablet 1  . pantoprazole (PROTONIX) 40 MG tablet Take 1 tablet (40 mg total) by mouth daily. 90 tablet 1  . triamterene-hydrochlorothiazide (MAXZIDE-25) 37.5-25 MG tablet Take 1 tablet by mouth daily. 90 tablet 1  . warfarin (JANTOVEN) 2 MG tablet Take 1 tablet (2 mg total) by mouth daily. 90 tablet 1   No facility-administered medications prior to visit.     Review  of Systems  Constitutional: Negative for chills, fatigue, fever, malaise/fatigue and weight loss.  HENT: Negative for ear discharge, ear pain, hoarse voice and sore throat.   Eyes: Negative for blurred vision.  Respiratory: Negative for cough, sputum production, choking, shortness of breath and wheezing.   Cardiovascular: Negative for chest pain, palpitations, orthopnea, leg swelling, syncope and PND.  Gastrointestinal: Negative for abdominal pain, anorexia, blood in stool, constipation, diarrhea, dysphagia, heartburn, melena, nausea and vomiting.  Genitourinary: Positive for flank pain, frequency, pelvic pain and urgency. Negative for discharge, dysuria, hematuria, hesitancy, penile pain, scrotal swelling and testicular pain.  Musculoskeletal: Negative for back pain, joint pain, myalgias, muscle weakness and neck pain.  Skin: Negative for rash.  Neurological: Negative for dizziness, tingling, sensory change, focal weakness, weakness and headaches.  Endo/Heme/Allergies: Negative for environmental allergies, polydipsia and polyphagia. Does not bruise/bleed easily.  Psychiatric/Behavioral: Negative for depression and suicidal ideas. The patient  is not nervous/anxious and does not have insomnia.      Objective  Vitals:   01/25/18 1043  BP: 120/70  Pulse: 64  Weight: 266 lb (120.7 kg)  Height: 6' (1.829 m)    Physical Exam  Constitutional: He is oriented to person, place, and time. He appears well-developed and well-nourished.  HENT:  Head: Normocephalic.  Right Ear: External ear normal.  Left Ear: External ear normal.  Nose: Nose normal.  Mouth/Throat: Oropharynx is clear and moist.  Eyes: Pupils are equal, round, and reactive to light. Conjunctivae and EOM are normal. Right eye exhibits no discharge. Left eye exhibits no discharge. No scleral icterus.  Neck: Normal range of motion. Neck supple. No JVD present. No tracheal deviation present. No thyromegaly present.  Cardiovascular:  Normal rate, regular rhythm, normal heart sounds and intact distal pulses. Exam reveals no gallop and no friction rub.  No murmur heard. Pulmonary/Chest: Breath sounds normal. No respiratory distress. He has no wheezes. He has no rales.  Abdominal: Soft. Bowel sounds are normal. He exhibits no mass. There is no hepatosplenomegaly. There is no tenderness. There is no rebound, no guarding and no CVA tenderness.  Genitourinary: Rectum normal and prostate normal. Rectal exam shows no mass and guaiac negative stool. Right testis shows no mass, no swelling and no tenderness. Right testis is undescended. Left testis shows no mass, no swelling and no tenderness. Left testis is undescended.  Musculoskeletal: Normal range of motion. He exhibits no edema or tenderness.  Lymphadenopathy:    He has no cervical adenopathy.  Neurological: He is alert and oriented to person, place, and time. He has normal strength and normal reflexes. No cranial nerve deficit.  Skin: Skin is warm. No rash noted.  Nursing note and vitals reviewed.     Assessment & Plan  Problem List Items Addressed This Visit      Cardiovascular and Mediastinum   Essential hypertension    Chronic Controlled. Will continue amlodipine 10 mg,losartin 100 mg, metoprolol 100 XL,and Triamterine 37.5/25 daily.      Relevant Medications   warfarin (COUMADIN) 4 MG tablet   amLODipine (NORVASC) 10 MG tablet   triamterene-hydrochlorothiazide (MAXZIDE-25) 37.5-25 MG tablet   metoprolol succinate (TOPROL-XL) 100 MG 24 hr tablet   losartan (COZAAR) 100 MG tablet   lovastatin (MEVACOR) 20 MG tablet     Digestive   Gastroesophageal reflux disease    Controlled. Will continue pantoprazole 40 mg daily.      Relevant Medications   pantoprazole (PROTONIX) 40 MG tablet     Endocrine   CKD stage 4 due to type 2 diabetes mellitus ([place])    Followed with nephrology. Will continue to monitor renal panel.      Relevant Medications   losartan  (COZAAR) 100 MG tablet   lovastatin (MEVACOR) 20 MG tablet   liraglutide (VICTOZA) 18 MG/3ML SOPN   Type 2 diabetes mellitus with diabetic nephropathy, without long-term current use of insulin (HCC) - Primary    Chronic stable will continue victoza . A1C 6.0 Will continue to monitor with renal panel and A1C      Relevant Medications   losartan (COZAAR) 100 MG tablet   lovastatin (MEVACOR) 20 MG tablet   liraglutide (VICTOZA) 18 MG/3ML SOPN   Insulin Pen Needle (NOVOFINE) 32G X 6 MM MISC     Other   Hyperuricemia    Uric acid level controlled with allopurinol 100mg  daily      Relevant Medications   allopurinol (ZYLOPRIM) 100  MG tablet   Mixed hyperlipidemia    Chronic Controlled Will continue lovastatin 20 mg daily and monitor lipid panel      Relevant Medications   warfarin (COUMADIN) 4 MG tablet   amLODipine (NORVASC) 10 MG tablet   triamterene-hydrochlorothiazide (MAXZIDE-25) 37.5-25 MG tablet   metoprolol succinate (TOPROL-XL) 100 MG 24 hr tablet   losartan (COZAAR) 100 MG tablet   lovastatin (MEVACOR) 20 MG tablet    Other Visit Diagnoses    Hypokalemia       Groin pain, right       Onset late may right groin pain radiating to medial thigh. Patient with history undescended testes Initial treatment antibiotic for prostatitis. Urology consult   Relevant Orders   Ambulatory referral to Urology   Polycystic kidney          Meds ordered this encounter  Medications  . amLODipine (NORVASC) 10 MG tablet    Sig: Take 1 tablet (10 mg total) by mouth daily.    Dispense:  90 tablet    Refill:  1  . triamterene-hydrochlorothiazide (MAXZIDE-25) 37.5-25 MG tablet    Sig: Take 1 tablet by mouth daily.    Dispense:  90 tablet    Refill:  1  . metoprolol succinate (TOPROL-XL) 100 MG 24 hr tablet    Sig: Take 1 tablet (100 mg total) by mouth daily. Take with or immediately following a meal.    Dispense:  90 tablet    Refill:  1  . losartan (COZAAR) 100 MG tablet    Sig: Take  1 tablet (100 mg total) by mouth daily.    Dispense:  90 tablet    Refill:  1  . pantoprazole (PROTONIX) 40 MG tablet    Sig: Take 1 tablet (40 mg total) by mouth daily.    Dispense:  90 tablet    Refill:  1  . allopurinol (ZYLOPRIM) 100 MG tablet    Sig: Take 1 tablet (100 mg total) by mouth daily.    Dispense:  90 tablet    Refill:  1  . lovastatin (MEVACOR) 20 MG tablet    Sig: Take 1 tablet (20 mg total) by mouth daily.    Dispense:  90 tablet    Refill:  1  . liraglutide (VICTOZA) 18 MG/3ML SOPN    Sig: INJECT 1.8MG  SUBCUTANEOUSLY 1 TIME DAILY    Dispense:  27 mL    Refill:  0  . Insulin Pen Needle (NOVOFINE) 32G X 6 MM MISC    Sig: Inject 1 each as directed daily. Dx E11.9- please dispense #100 per 90 days!!!!    Dispense:  100 each    Refill:  1      Dr. Otilio Miu [facility]  01/25/18

## 2018-01-25 NOTE — Assessment & Plan Note (Signed)
Followed with nephrology. Will continue to monitor renal panel.

## 2018-01-31 ENCOUNTER — Other Ambulatory Visit: Payer: Self-pay

## 2018-02-14 NOTE — Progress Notes (Signed)
02/15/2018 4:21 PM   Mark Bauer April 08, 1956 [id number]  Referring provider: Juline Patch, MD [address] [place], [place] [address]  Chief Complaint  Patient presents with  . Groin Pain    HPI: Patient is a 62 year old Caucasian male who is referred for right groin pain by Dr. Juline Patch.   He states at the end of May, he thought he was having an UTI.  He was having dysuria and frequency.  Patient denies any gross hematuria, dysuria or suprapubic/flank pain.  Patient denies any fevers, chills, nausea or vomiting. He contacted his nephrologist and an UTI was ruled out, but he was given antibiotics for longer.     He thought he may have had a stone 20 years ago.    He states he saw an urologist many years ago and was told he could have a surgery, but he couldn't afford it.    He states he can feel them from time to time in the inguinal canal.    He has not fathered children and did not want to pursue fertility treatments.      PMH: Past Medical History:  Diagnosis Date  . Diabetes mellitus without complication ([place])   . Gout   . Hypertension   . Hypokalemia   . Polycystic kidney   . Vitamin D deficiency     Surgical History: Past Surgical History:  Procedure Laterality Date  . COLONOSCOPY  2012   hemorrhoids    Home Medications:  Allergies as of 02/15/2018      Reactions   Caffeine    Ciprofloxacin Other (See Comments)   Pollen Extract Other (See Comments)   Sneezing, watery eyes, etc.      Medication List        Accurate as of 02/15/18  4:21 PM. Always use your most recent med list.          allopurinol 100 MG tablet Commonly known as:  ZYLOPRIM Take 1 tablet (100 mg total) by mouth daily.   amLODipine 10 MG tablet Commonly known as:  NORVASC Take 1 tablet (10 mg total) by mouth daily.   D3 SUPER STRENGTH 2000 units Caps Generic drug:  Cholecalciferol Take 1 capsule by mouth as directed. M-Friday   Insulin Pen Needle 32G  X 6 MM Misc Commonly known as:  NOVOFINE Inject 1 each as directed daily. Dx E11.9- please dispense #100 per 90 days!!!!   liraglutide 18 MG/3ML Sopn Commonly known as:  VICTOZA INJECT 1.8MG  SUBCUTANEOUSLY 1 TIME DAILY   loratadine 10 MG tablet Commonly known as:  CLARITIN Take 10 mg by mouth daily. otc   losartan 100 MG tablet Commonly known as:  COZAAR Take 1 tablet (100 mg total) by mouth daily.   lovastatin 20 MG tablet Commonly known as:  MEVACOR Take 1 tablet (20 mg total) by mouth daily.   metoprolol succinate 100 MG 24 hr tablet Commonly known as:  TOPROL-XL Take 1 tablet (100 mg total) by mouth daily. Take with or immediately following a meal.   pantoprazole 40 MG tablet Commonly known as:  PROTONIX Take 1 tablet (40 mg total) by mouth daily.   potassium chloride 10 MEQ tablet Commonly known as:  K-DUR,KLOR-CON Take 1 tablet by mouth 2 (two) times daily. Nephrology   triamterene-hydrochlorothiazide 37.5-25 MG tablet Commonly known as:  MAXZIDE-25 Take 1 tablet by mouth daily.   warfarin 4 MG tablet Commonly known as:  COUMADIN Take 1 tablet by  mouth daily.   warfarin 5 MG tablet Commonly known as:  COUMADIN Take 1 tablet (5 mg total) by mouth daily.       Allergies:  Allergies  Allergen Reactions  . Caffeine   . Ciprofloxacin Other (See Comments)  . Pollen Extract Other (See Comments)    Sneezing, watery eyes, etc.    Family History: Family History  Problem Relation Age of Onset  . Diabetes Mother   . Heart disease Father     Social History:  reports that he has never smoked. He has never used smokeless tobacco. He reports that he does not drink alcohol or use drugs.  ROS: UROLOGY Frequent Urination?: No Hard to postpone urination?: No Burning/pain with urination?: No Get up at night to urinate?: Yes Leakage of urine?: No Urine stream starts and stops?: No Trouble starting stream?: No Do you have to strain to urinate?: No Blood in  urine?: No Urinary tract infection?: No Sexually transmitted disease?: No Injury to kidneys or bladder?: No Painful intercourse?: No Weak stream?: No Erection problems?: No Penile pain?: No  Gastrointestinal Nausea?: No Vomiting?: No Indigestion/heartburn?: No Diarrhea?: No Constipation?: No  Constitutional Fever: No Night sweats?: No Weight loss?: No Fatigue?: No  Skin Skin rash/lesions?: No Itching?: No  Eyes Blurred vision?: No Double vision?: No  Ears/Nose/Throat Sore throat?: No Sinus problems?: No  Hematologic/Lymphatic Swollen glands?: No Easy bruising?: No  Cardiovascular Leg swelling?: Yes Chest pain?: No  Respiratory Cough?: No Shortness of breath?: No  Endocrine Excessive thirst?: No  Musculoskeletal Back pain?: Yes Joint pain?: No  Neurological Headaches?: No Dizziness?: No  Psychologic Depression?: No Anxiety?: No  Physical Exam: BP 96/65   Pulse 97   Ht 6' (1.829 m)   Wt 264 lb 8 oz (120 kg)   BMI 35.87 kg/m   Constitutional:  Well nourished. Alert and oriented, No acute distress. HEENT: [place] AT, moist mucus membranes.  Trachea midline, no masses. Cardiovascular: No clubbing, cyanosis, or edema. Respiratory: Normal respiratory effort, no increased work of breathing. GI: Abdomen is soft, non tender, non distended, no abdominal masses. Liver and spleen not palpable.  No hernias appreciated.  Stool sample for occult testing is not indicated.   GU: No CVA tenderness.  No bladder fullness or masses.  Patient with buried phallus.  Urethral meatus is patent.  No penile discharge. No penile lesions or rashes. Scrotum without lesions, cysts, rashes and/or edema.  Could not palpate testicles in the scrotum or the inguinal canals.   Rectal: Patient with  normal sphincter tone. Anus and perineum without scarring or rashes. No rectal masses are appreciated. Prostate is approximately 25 grams, no nodules are appreciated. Seminal vesicles are  normal. Skin: No rashes, bruises or suspicious lesions. Lymph: No cervical or inguinal adenopathy. Neurologic: Grossly intact, no focal deficits, moving all 4 extremities. Psychiatric: Normal mood and affect.  Laboratory Data: No results found for: WBC, HGB, HCT, MCV, PLT  Lab Results  Component Value Date   CREATININE 3.20 (H) 01/11/2018    No results found for: PSA  No results found for: TESTOSTERONE  Lab Results  Component Value Date   HGBA1C 6.0 (H) 01/11/2018    No results found for: TSH     Component Value Date/Time   CHOL 124 01/11/2018 1025   HDL 40 01/11/2018 1025   CHOLHDL 3.7 12/16/2016 1033   LDLCALC 66 01/11/2018 1025    Lab Results  Component Value Date   AST 30 01/11/2018   Lab Results  Component Value Date   ALT 37 01/11/2018   No components found for: ALKALINEPHOPHATASE No components found for: BILIRUBINTOTAL  No results found for: ESTRADIOL  Urinalysis No results found for: COLORURINE, APPEARANCEUR, LABSPEC, PHURINE, GLUCOSEU, HGBUR, BILIRUBINUR, KETONESUR, PROTEINUR, UROBILINOGEN, NITRITE, LEUKOCYTESUR  I have reviewed the labs.  Assessment & Plan:    1. Undescended testicles Explain to the patient that undescended testicles carry a higher risk for testicular cancer Obtain a scrotal ultrasound  He does not desire a referral to an endocrinologist at this time for genetic testing  He is not wanting any surgery at this time to remove the testicles   Return for scrotal ultrasound report .  These notes generated with voice recognition software. I apologize for typographical errors.  Zara Council, PA-C  [facility] [address]  [place], [place] [address] [phone]

## 2018-02-15 ENCOUNTER — Other Ambulatory Visit: Payer: Self-pay

## 2018-02-15 ENCOUNTER — Encounter: Payer: Self-pay | Admitting: Urology

## 2018-02-15 ENCOUNTER — Ambulatory Visit (INDEPENDENT_AMBULATORY_CARE_PROVIDER_SITE_OTHER): Payer: 59 | Admitting: Urology

## 2018-02-15 VITALS — BP 96/65 | HR 97 | Ht 72.0 in | Wt 264.5 lb

## 2018-02-15 DIAGNOSIS — Q5310 Unspecified undescended testicle, unilateral: Secondary | ICD-10-CM | POA: Diagnosis not present

## 2018-02-15 DIAGNOSIS — R1031 Right lower quadrant pain: Secondary | ICD-10-CM

## 2018-02-15 LAB — BLADDER SCAN AMB NON-IMAGING: SCAN RESULT: 0

## 2018-02-16 LAB — PSA: Prostate Specific Ag, Serum: 0.3 ng/mL (ref 0.0–4.0)

## 2018-02-22 ENCOUNTER — Ambulatory Visit
Admission: RE | Admit: 2018-02-22 | Discharge: 2018-02-22 | Disposition: A | Discharge status: Home / Self Care | Payer: 59 | Source: Ambulatory Visit | Attending: Urology | Admitting: Urology

## 2018-02-22 DIAGNOSIS — L987 Excessive and redundant skin and subcutaneous tissue: Secondary | ICD-10-CM | POA: Diagnosis not present

## 2018-02-22 DIAGNOSIS — Q5310 Unspecified undescended testicle, unilateral: Secondary | ICD-10-CM

## 2018-02-23 NOTE — Progress Notes (Signed)
02/24/2018 3:38 PM   Mark Bauer [birth year] [id number]  Referring provider: Juline Patch, MD [address] [place], [place] [address]  Chief Complaint  Patient presents with  . Results    Ultrasound    HPI: Patient is a 62 year old Caucasian male who presents today for scrotal ultrasound report.  Background history Patient is a 62 year old Caucasian male who is referred for right groin pain by Dr. Juline Patch.  He states at the end of May, he thought he was having an UTI.  He was having dysuria and frequency.  Patient denies any gross hematuria, dysuria or suprapubic/flank pain.  Patient denies any fevers, chills, nausea or vomiting. He contacted his nephrologist and an UTI was ruled out, but he was given antibiotics for longer.   He thought he may have had a stone 20 years ago.  He states he saw an urologist many years ago and was told he could have a surgery, but he couldn't afford it.  He states he can feel them from time to time in the inguinal canal.   He has not fathered children and did not want to pursue fertility treatments.      Scrotal ultrasound on 02/22/2018 noted scrotum is redundant skin. Bilateral testis can be seen when the camera is touching perineal area and at lateral to the penis base, near lower inguinal areas. The right testicle has no definite blood flow seen. Arterial blood flow is seen in the inferior portion of left testicle.  He is having some slight pain in the right groin, but it is not bothersome or limiting his activities.  Patient denies any gross hematuria, dysuria or suprapubic/flank pain.  Patient denies any fevers, chills, nausea or vomiting.   PMH: Past Medical History:  Diagnosis Date  . Diabetes mellitus without complication ([place])   . Gout   . Hypertension   . Hypokalemia   . Polycystic kidney   . Vitamin D deficiency     Surgical History: Past Surgical History:  Procedure Laterality Date  . COLONOSCOPY  2012   hemorrhoids    Home Medications:  Allergies as of 02/24/2018      Reactions   Caffeine    Ciprofloxacin Other (See Comments)   Pollen Extract Other (See Comments)   Sneezing, watery eyes, etc.      Medication List        Accurate as of 02/24/18  3:38 PM. Always use your most recent med list.          allopurinol 100 MG tablet Commonly known as:  ZYLOPRIM Take 1 tablet (100 mg total) by mouth daily.   amLODipine 10 MG tablet Commonly known as:  NORVASC Take 1 tablet (10 mg total) by mouth daily.   D3 SUPER STRENGTH 2000 units Caps Generic drug:  Cholecalciferol Take 1 capsule by mouth as directed. M-Friday   Insulin Pen Needle 32G X 6 MM Misc Inject 1 each as directed daily. Dx E11.9- please dispense #100 per 90 days!!!!   liraglutide 18 MG/3ML Sopn Commonly known as:  VICTOZA INJECT 1.8MG  SUBCUTANEOUSLY 1 TIME DAILY   loratadine 10 MG tablet Commonly known as:  CLARITIN Take 10 mg by mouth daily. otc   losartan 100 MG tablet Commonly known as:  COZAAR Take 1 tablet (100 mg total) by mouth daily.   lovastatin 20 MG tablet Commonly known as:  MEVACOR Take 1 tablet (20 mg total) by mouth daily.   metoprolol  succinate 100 MG 24 hr tablet Commonly known as:  TOPROL-XL Take 1 tablet (100 mg total) by mouth daily. Take with or immediately following a meal.   pantoprazole 40 MG tablet Commonly known as:  PROTONIX Take 1 tablet (40 mg total) by mouth daily.   potassium chloride 10 MEQ tablet Commonly known as:  K-DUR,KLOR-CON Take 1 tablet by mouth 2 (two) times daily. Nephrology   triamterene-hydrochlorothiazide 37.5-25 MG tablet Commonly known as:  MAXZIDE-25 Take 1 tablet by mouth daily.   warfarin 4 MG tablet Commonly known as:  COUMADIN Take 1 tablet by mouth daily.       Allergies:  Allergies  Allergen Reactions  . Caffeine   . Ciprofloxacin Other (See Comments)  . Pollen Extract Other (See Comments)    Sneezing, watery eyes, etc.    Family  History: Family History  Problem Relation Age of Onset  . Diabetes Mother   . Heart disease Father     Social History:  reports that he has never smoked. He has never used smokeless tobacco. He reports that he does not drink alcohol or use drugs.  ROS: UROLOGY Frequent Urination?: No Hard to postpone urination?: No Burning/pain with urination?: No Get up at night to urinate?: No Leakage of urine?: No Urine stream starts and stops?: No Trouble starting stream?: No Do you have to strain to urinate?: No Blood in urine?: No Urinary tract infection?: No Sexually transmitted disease?: No Injury to kidneys or bladder?: No Painful intercourse?: No Weak stream?: No Erection problems?: No Penile pain?: No  Gastrointestinal Nausea?: No Vomiting?: No Indigestion/heartburn?: No Diarrhea?: No Constipation?: No  Constitutional Fever: No Night sweats?: No Weight loss?: No Fatigue?: No  Skin Skin rash/lesions?: No Itching?: No  Eyes Blurred vision?: No Double vision?: No  Ears/Nose/Throat Sore throat?: No Sinus problems?: No  Hematologic/Lymphatic Swollen glands?: No Easy bruising?: No  Cardiovascular Leg swelling?: No Chest pain?: No  Respiratory Cough?: No Shortness of breath?: No  Endocrine Excessive thirst?: No  Musculoskeletal Back pain?: No Joint pain?: No  Neurological Headaches?: No Dizziness?: No  Psychologic Depression?: No Anxiety?: No  Physical Exam: BP 133/72   Pulse (!) 114   Ht 6' (1.829 m)   Wt 261 lb (118.4 kg)   BMI 35.40 kg/m   Constitutional: Well nourished. Alert and oriented, No acute distress. HEENT: [place] AT, moist mucus membranes. Trachea midline, no masses. Cardiovascular: No clubbing, cyanosis, or edema. Respiratory: Normal respiratory effort, no increased work of breathing. Skin: No rashes, bruises or suspicious lesions. Lymph: No cervical or inguinal adenopathy. Neurologic: Grossly intact, no focal deficits,  moving all 4 extremities. Psychiatric: Normal mood and affect.   Laboratory Data: No results found for: WBC, HGB, HCT, MCV, PLT  Lab Results  Component Value Date   CREATININE 3.20 (H) 01/11/2018    No results found for: PSA  No results found for: TESTOSTERONE  Lab Results  Component Value Date   HGBA1C 6.0 (H) 01/11/2018    No results found for: TSH     Component Value Date/Time   CHOL 124 01/11/2018 1025   HDL 40 01/11/2018 1025   CHOLHDL 3.7 12/16/2016 1033   LDLCALC 66 01/11/2018 1025    Lab Results  Component Value Date   AST 30 01/11/2018   Lab Results  Component Value Date   ALT 37 01/11/2018   No components found for: ALKALINEPHOPHATASE No components found for: BILIRUBINTOTAL  No results found for: ESTRADIOL  Urinalysis No results found for: COLORURINE,  APPEARANCEUR, LABSPEC, PHURINE, GLUCOSEU, HGBUR, BILIRUBINUR, KETONESUR, PROTEINUR, UROBILINOGEN, NITRITE, LEUKOCYTESUR  I have reviewed the labs.  Pertinent Imaging CLINICAL DATA:  Patient has history of undescended testicles since age 28. Patient has had right inguinal pain since May 2019  EXAM: SCROTAL ULTRASOUND  DOPPLER ULTRASOUND OF THE TESTICLES  TECHNIQUE: Complete ultrasound examination of the testicles, epididymis, and other scrotal structures was performed. Color and spectral Doppler ultrasound were also utilized to evaluate blood flow to the testicles.  COMPARISON:  None.  FINDINGS: Right testicle  Scrotum is redundant skin. Right testicle can be seen when the camera is touching perineal area and at lateral to the penis base, near lower inguinal areas. Measurements: 2.9 x 1.1 x 2.1 cm. No mass or microlithiasis visualized.  Left testicle  Scrotum is redundant skin. Left testicle can be seen when the camera is touching perineal area and at lateral to the penis base, near lower inguinal areas. Measurements: 2.1 x 0.9 x 1.7 cm. No mass or microlithiasis  visualized.  Right epididymis: Visualized portions are normal. Epididymal head is not seen.  Left epididymis: Visualized portions are normal. Epididymal head is not seen.  Hydrocele:  None visualized.  Varicocele:  None visualized.  Pulsed Doppler interrogation of both testes demonstrates no normal low resistance arterial and venous waveforms in the right testicle. Color flow and arterial for flow is identified in the inferior portion of the left testicle. No venous flow is detected in the left testicle.  IMPRESSION: Scrotum is redundant skin. Bilateral testis can be seen when the camera is touching perineal area and at lateral to the penis base, near lower inguinal areas. The right testicle has no definite blood flow seen. Arterial blood flow is seen in the inferior portion of left testicle.   Electronically Signed   By: Abelardo Diesel M.D.   On: 02/23/2018 09:19   Assessment & Plan:    1. Undescended testicles Explain to the patient that undescended testicles carry a higher risk for testicular cancer Both testicles are seen on ultrasound and are atrophic He does not desire a referral to an endocrinologist at this time for genetic testing  He is not wanting any surgery at this time to remove the testicles  RTC in one year for recheck  Return in about 1 year (around 02/25/2019) for recheck .  These notes generated with voice recognition software. I apologize for typographical errors.  Zara Council, PA-C  [facility] [address]  [place], [place] [address] [phone]

## 2018-02-24 ENCOUNTER — Encounter: Payer: Self-pay | Admitting: Urology

## 2018-02-24 ENCOUNTER — Ambulatory Visit (INDEPENDENT_AMBULATORY_CARE_PROVIDER_SITE_OTHER): Payer: 59 | Admitting: Urology

## 2018-02-24 VITALS — BP 133/72 | HR 114 | Ht 72.0 in | Wt 261.0 lb

## 2018-02-24 DIAGNOSIS — Q5310 Unspecified undescended testicle, unilateral: Secondary | ICD-10-CM

## 2018-03-24 ENCOUNTER — Other Ambulatory Visit: Payer: Self-pay | Admitting: Family Medicine

## 2018-03-24 DIAGNOSIS — E1121 Type 2 diabetes mellitus with diabetic nephropathy: Secondary | ICD-10-CM

## 2018-04-25 ENCOUNTER — Other Ambulatory Visit: Payer: Self-pay | Admitting: Family Medicine

## 2018-04-26 ENCOUNTER — Other Ambulatory Visit: Payer: Self-pay | Admitting: Family Medicine

## 2018-04-26 DIAGNOSIS — I482 Chronic atrial fibrillation, unspecified: Secondary | ICD-10-CM

## 2018-04-26 LAB — PROTIME-INR
INR: 1.6 — ABNORMAL HIGH (ref 0.8–1.2)
PROTHROMBIN TIME: 15.7 s — AB (ref 9.1–12.0)

## 2018-05-12 ENCOUNTER — Other Ambulatory Visit: Payer: Self-pay | Admitting: Family Medicine

## 2018-05-13 LAB — PROTIME-INR
INR: 3.3 — ABNORMAL HIGH (ref 0.8–1.2)
Prothrombin Time: 30.8 s — ABNORMAL HIGH (ref 9.1–12.0)

## 2018-05-31 ENCOUNTER — Other Ambulatory Visit: Payer: Self-pay | Admitting: Family Medicine

## 2018-06-01 LAB — PROTIME-INR
INR: 3 — AB (ref 0.8–1.2)
PROTHROMBIN TIME: 28.8 s — AB (ref 9.1–12.0)

## 2018-06-06 ENCOUNTER — Other Ambulatory Visit (INDEPENDENT_AMBULATORY_CARE_PROVIDER_SITE_OTHER): Payer: Self-pay | Admitting: Nephrology

## 2018-06-06 ENCOUNTER — Other Ambulatory Visit: Payer: Self-pay | Admitting: Nephrology

## 2018-06-06 DIAGNOSIS — N186 End stage renal disease: Secondary | ICD-10-CM

## 2018-06-07 ENCOUNTER — Ambulatory Visit (INDEPENDENT_AMBULATORY_CARE_PROVIDER_SITE_OTHER): Payer: 59

## 2018-06-07 DIAGNOSIS — N186 End stage renal disease: Secondary | ICD-10-CM

## 2018-06-20 ENCOUNTER — Other Ambulatory Visit: Payer: Self-pay | Admitting: Family Medicine

## 2018-06-20 DIAGNOSIS — E790 Hyperuricemia without signs of inflammatory arthritis and tophaceous disease: Secondary | ICD-10-CM

## 2018-06-20 DIAGNOSIS — E1121 Type 2 diabetes mellitus with diabetic nephropathy: Secondary | ICD-10-CM

## 2018-06-20 DIAGNOSIS — E782 Mixed hyperlipidemia: Secondary | ICD-10-CM

## 2018-06-20 DIAGNOSIS — K219 Gastro-esophageal reflux disease without esophagitis: Secondary | ICD-10-CM

## 2018-06-20 DIAGNOSIS — I10 Essential (primary) hypertension: Secondary | ICD-10-CM

## 2018-06-24 ENCOUNTER — Other Ambulatory Visit: Payer: 59

## 2018-06-24 DIAGNOSIS — E1121 Type 2 diabetes mellitus with diabetic nephropathy: Secondary | ICD-10-CM

## 2018-06-24 DIAGNOSIS — I10 Essential (primary) hypertension: Secondary | ICD-10-CM

## 2018-06-24 DIAGNOSIS — E782 Mixed hyperlipidemia: Secondary | ICD-10-CM

## 2018-06-24 DIAGNOSIS — R69 Illness, unspecified: Secondary | ICD-10-CM

## 2018-06-24 DIAGNOSIS — I482 Chronic atrial fibrillation, unspecified: Secondary | ICD-10-CM

## 2018-06-24 NOTE — Progress Notes (Signed)
micro

## 2018-06-24 NOTE — Addendum Note (Signed)
Addended by: Fredderick Severance on: 06/24/2018 11:29 AM   Modules accepted: Orders

## 2018-06-25 LAB — HEMOGLOBIN A1C
Est. average glucose Bld gHb Est-mCnc: 131 mg/dL
Hgb A1c MFr Bld: 6.2 % — ABNORMAL HIGH (ref 4.8–5.6)

## 2018-06-25 LAB — RENAL FUNCTION PANEL
Albumin: 4.2 g/dL (ref 3.6–4.8)
BUN/Creatinine Ratio: 14 (ref 10–24)
BUN: 47 mg/dL — ABNORMAL HIGH (ref 8–27)
CO2: 20 mmol/L (ref 20–29)
CREATININE: 3.29 mg/dL — AB (ref 0.76–1.27)
Calcium: 9.6 mg/dL (ref 8.6–10.2)
Chloride: 106 mmol/L (ref 96–106)
GFR calc Af Amer: 22 mL/min/{1.73_m2} — ABNORMAL LOW (ref >59)
GFR calc non Af Amer: 19 mL/min/{1.73_m2} — ABNORMAL LOW (ref >59)
Glucose: 93 mg/dL (ref 65–99)
PHOSPHORUS: 3.1 mg/dL (ref 2.5–4.5)
Potassium: 3.6 mmol/L (ref 3.5–5.2)
Sodium: 144 mmol/L (ref 134–144)

## 2018-06-25 LAB — HEPATIC FUNCTION PANEL
ALK PHOS: 60 IU/L (ref 39–117)
ALT: 16 IU/L (ref 0–44)
AST: 21 IU/L (ref 0–40)
BILIRUBIN, DIRECT: 0.18 mg/dL (ref 0.00–0.40)
Bilirubin Total: 0.6 mg/dL (ref 0.0–1.2)
Total Protein: 6.4 g/dL (ref 6.0–8.5)

## 2018-06-25 LAB — LIPID PANEL WITH LDL/HDL RATIO
Cholesterol, Total: 108 mg/dL (ref 100–199)
HDL: 29 mg/dL — ABNORMAL LOW (ref >39)
LDL Calculated: 62 mg/dL (ref 0–99)
LDl/HDL Ratio: 2.1 ratio (ref 0.0–3.6)
Triglycerides: 84 mg/dL (ref 0–149)
VLDL Cholesterol Cal: 17 mg/dL (ref 5–40)

## 2018-06-25 LAB — MICROALBUMIN, URINE: Microalbumin, Urine: 214.1 ug/mL

## 2018-06-25 LAB — PROTIME-INR
INR: 3.7 — AB (ref 0.8–1.2)
Prothrombin Time: 34.6 s — ABNORMAL HIGH (ref 9.1–12.0)

## 2018-07-07 ENCOUNTER — Encounter: Payer: Self-pay | Admitting: Family Medicine

## 2018-07-07 ENCOUNTER — Ambulatory Visit (INDEPENDENT_AMBULATORY_CARE_PROVIDER_SITE_OTHER): Payer: 59 | Admitting: Family Medicine

## 2018-07-07 VITALS — BP 110/60 | HR 80 | Ht 72.0 in | Wt 270.0 lb

## 2018-07-07 DIAGNOSIS — E782 Mixed hyperlipidemia: Secondary | ICD-10-CM | POA: Diagnosis not present

## 2018-07-07 DIAGNOSIS — E790 Hyperuricemia without signs of inflammatory arthritis and tophaceous disease: Secondary | ICD-10-CM | POA: Diagnosis not present

## 2018-07-07 DIAGNOSIS — E1121 Type 2 diabetes mellitus with diabetic nephropathy: Secondary | ICD-10-CM

## 2018-07-07 DIAGNOSIS — I482 Chronic atrial fibrillation, unspecified: Secondary | ICD-10-CM

## 2018-07-07 DIAGNOSIS — I10 Essential (primary) hypertension: Secondary | ICD-10-CM

## 2018-07-07 DIAGNOSIS — K219 Gastro-esophageal reflux disease without esophagitis: Secondary | ICD-10-CM

## 2018-07-07 DIAGNOSIS — Z6836 Body mass index (BMI) 36.0-36.9, adult: Secondary | ICD-10-CM

## 2018-07-07 DIAGNOSIS — Z7901 Long term (current) use of anticoagulants: Secondary | ICD-10-CM

## 2018-07-07 MED ORDER — LOSARTAN POTASSIUM 100 MG PO TABS: 100.0000 mg | ORAL_TABLET | Freq: Every day | ORAL | 1 refills | Status: DC

## 2018-07-07 MED ORDER — LIRAGLUTIDE 18 MG/3ML ~~LOC~~ SOPN: PEN_INJECTOR | SUBCUTANEOUS | 1 refills | Status: DC

## 2018-07-07 MED ORDER — METOPROLOL SUCCINATE ER 100 MG PO TB24: 100.0000 mg | ORAL_TABLET | Freq: Every day | ORAL | 1 refills | Status: DC

## 2018-07-07 MED ORDER — WARFARIN SODIUM 2 MG PO TABS: 2.0000 mg | ORAL_TABLET | Freq: Every day | ORAL | 1 refills | Status: DC

## 2018-07-07 MED ORDER — ALLOPURINOL 100 MG PO TABS: 100.0000 mg | ORAL_TABLET | Freq: Every day | ORAL | 1 refills | Status: DC

## 2018-07-07 MED ORDER — LOVASTATIN 20 MG PO TABS: 20.0000 mg | ORAL_TABLET | Freq: Every day | ORAL | 1 refills | Status: DC

## 2018-07-07 MED ORDER — INSULIN PEN NEEDLE 32G X 6 MM MISC: 1 refills | Status: DC

## 2018-07-07 MED ORDER — AMLODIPINE BESYLATE 10 MG PO TABS: 10.0000 mg | ORAL_TABLET | Freq: Every day | ORAL | 1 refills | Status: DC

## 2018-07-07 MED ORDER — PANTOPRAZOLE SODIUM 40 MG PO TBEC: 40.0000 mg | DELAYED_RELEASE_TABLET | Freq: Every day | ORAL | 1 refills | Status: DC

## 2018-07-07 NOTE — Progress Notes (Signed)
Date:  07/07/2018   Name:  Mark Bauer   DOB:  03/25/1956   MRN:  [id number]   Chief Complaint: Diabetes; Hypertension; Hyperlipidemia; Gout; Gastroesophageal Reflux; and Atrial Fibrillation (takes coumadin for this)  Diabetes  He presents for his follow-up diabetic visit. He has type 2 diabetes mellitus. His disease course has been stable. Pertinent negatives for hypoglycemia include no confusion, dizziness, headaches, hunger, mood changes, nervousness/anxiousness, pallor, seizures, sleepiness, speech difficulty, sweats or tremors. Pertinent negatives for diabetes include no blurred vision, no chest pain, no fatigue, no foot paresthesias, no foot ulcerations, no polydipsia, no polyphagia, no polyuria, no visual change, no weakness and no weight loss. There are no hypoglycemic complications. Symptoms are stable. There are no diabetic complications. Pertinent negatives for diabetic complications include no CVA, PVD or retinopathy. Risk factors for coronary artery disease include diabetes mellitus, dyslipidemia, hypertension, male sex and obesity. Current diabetic treatment includes diet (victoza). He is compliant with treatment all of the time. His weight is stable. He is following a generally healthy diet. Meal planning includes avoidance of concentrated sweets and carbohydrate counting. He participates in exercise daily. His home blood glucose trend is fluctuating minimally. His breakfast blood glucose is taken between 8-9 am. His breakfast blood glucose range is generally 110-130 mg/dl. An ACE inhibitor/angiotensin II receptor blocker is being taken. He does not see a podiatrist.Eye exam is current.  Hypertension  This is a chronic problem. The current episode started more than 1 year ago. The problem has been waxing and waning since onset. The problem is controlled. Pertinent negatives include no anxiety, blurred vision, chest pain, headaches, malaise/fatigue, neck pain, orthopnea, palpitations,  peripheral edema, PND, shortness of breath or sweats. There are no associated agents to hypertension. Risk factors for coronary artery disease include diabetes mellitus, dyslipidemia and obesity. Past treatments include angiotensin blockers, calcium channel blockers and beta blockers. The current treatment provides moderate improvement. There is no history of angina, kidney disease, CAD/MI, CVA, heart failure, left ventricular hypertrophy, PVD or retinopathy. There is no history of chronic renal disease, a hypertension causing med or renovascular disease.  Hyperlipidemia  This is a chronic problem. The current episode started more than 1 year ago. The problem is controlled. Recent lipid tests were reviewed and are normal. Exacerbating diseases include diabetes. He has no history of chronic renal disease, hypothyroidism, liver disease, obesity or nephrotic syndrome. Pertinent negatives include no chest pain, focal sensory loss, focal weakness, leg pain, myalgias or shortness of breath. Current antihyperlipidemic treatment includes statins. The current treatment provides moderate improvement of lipids. There are no compliance problems.  Risk factors for coronary artery disease include dyslipidemia, diabetes mellitus, male sex and hypertension.  Gastroesophageal Reflux  He reports no abdominal pain, no belching, no chest pain, no choking, no coughing, no dysphagia, no early satiety, no globus sensation, no heartburn, no hoarse voice, no nausea, no sore throat, no stridor or no wheezing. This is a chronic problem. The problem occurs rarely. The problem has been resolved. Nothing aggravates the symptoms. Pertinent negatives include no anemia, fatigue, melena, muscle weakness, orthopnea or weight loss. He has tried a PPI for the symptoms. The treatment provided moderate relief. Past procedures do not include an abdominal ultrasound, an EGD, esophageal manometry, H. pylori antibody titer or a UGI.  Atrial Fibrillation   Presents for follow-up visit. Symptoms include hypertension. Symptoms are negative for an AICD problem, bradycardia, chest pain, dizziness, hypotension, pacemaker problem, palpitations, shortness of breath, syncope, tachycardia and weakness.  The symptoms have been stable. Past medical history includes atrial fibrillation and hyperlipidemia.    Review of Systems  Constitutional: Negative for chills, fatigue, fever, malaise/fatigue and weight loss.  HENT: Negative for drooling, ear discharge, ear pain, hoarse voice and sore throat.   Eyes: Negative for blurred vision.  Respiratory: Negative for cough, choking, shortness of breath and wheezing.   Cardiovascular: Negative for chest pain, palpitations, orthopnea, leg swelling, syncope and PND.  Gastrointestinal: Negative for abdominal pain, blood in stool, constipation, diarrhea, dysphagia, heartburn, melena and nausea.  Endocrine: Negative for polydipsia, polyphagia and polyuria.  Genitourinary: Negative for dysuria, frequency, hematuria and urgency.  Musculoskeletal: Negative for back pain, myalgias, muscle weakness and neck pain.  Skin: Negative for pallor and rash.  Allergic/Immunologic: Negative for environmental allergies.  Neurological: Negative for dizziness, tremors, focal weakness, seizures, speech difficulty, weakness and headaches.  Hematological: Does not bruise/bleed easily.  Psychiatric/Behavioral: Negative for confusion and suicidal ideas. The patient is not nervous/anxious.     Patient Active Problem List   Diagnosis Date Noted  . CKD stage 4 due to type 2 diabetes mellitus ([place]) 01/25/2018  . Type 2 diabetes mellitus with diabetic nephropathy, without long-term current use of insulin ([place]) 01/25/2018  . Chronic atrial fibrillation 01/06/2017  . Type 2 diabetes mellitus without complication, without long-term current use of insulin ([place]) 01/06/2017  . Essential hypertension 01/06/2017  . Hyperuricemia 01/06/2017  . Mixed  hyperlipidemia 01/06/2017  . Gastroesophageal reflux disease 01/06/2017    Allergies  Allergen Reactions  . Caffeine   . Ciprofloxacin Other (See Comments)  . Pollen Extract Other (See Comments)    Sneezing, watery eyes, etc.    Past Surgical History:  Procedure Laterality Date  . COLONOSCOPY  2012   hemorrhoids    Social History   Tobacco Use  . Smoking status: Never Smoker  . Smokeless tobacco: Never Used  Substance Use Topics  . Alcohol use: No    Alcohol/week: 0.0 standard drinks  . Drug use: No     Medication list has been reviewed and updated.  Current Meds  Medication Sig  . allopurinol (ZYLOPRIM) 100 MG tablet TAKE 1 TABLET BY MOUTH  DAILY  . amLODipine (NORVASC) 10 MG tablet TAKE 1 TABLET BY MOUTH  DAILY  . Cholecalciferol (D3 SUPER STRENGTH) 2000 units CAPS Take 1 capsule by mouth as directed. M-Friday  . liraglutide (VICTOZA) 18 MG/3ML SOPN INJECT SUBCUTANEOUSLY 1.8MG  ONCE A DAY  . loratadine (CLARITIN) 10 MG tablet Take 10 mg by mouth daily. otc  . losartan (COZAAR) 100 MG tablet TAKE 1 TABLET BY MOUTH  DAILY  . lovastatin (MEVACOR) 20 MG tablet TAKE 1 TABLET BY MOUTH  DAILY  . metoprolol succinate (TOPROL-XL) 100 MG 24 hr tablet TAKE 1 TABLET BY MOUTH  DAILY WITH OR IMMEDIATLEY  FOLLOWING A MEAL  . NOVOFINE 32G X 6 MM MISC INJECT SUBCUTANEOUSLY 1  NEEDLE DAILY AS DIRECTED  . pantoprazole (PROTONIX) 40 MG tablet TAKE 1 TABLET BY MOUTH  DAILY  . potassium chloride (K-DUR,KLOR-CON) 10 MEQ tablet Take 1 tablet by mouth 2 (two) times daily. Nephrology  . warfarin (COUMADIN) 2 MG tablet TAKE 1 TABLET BY MOUTH  DAILY  . warfarin (COUMADIN) 4 MG tablet Take 1 tablet by mouth daily.  Marland Kitchen warfarin (COUMADIN) 5 MG tablet TAKE 1 TABLET BY MOUTH  DAILY  . [DISCONTINUED] triamterene-hydrochlorothiazide (MAXZIDE-25) 37.5-25 MG tablet TAKE 1 TABLET BY MOUTH  DAILY    PHQ 2/9 Scores 07/07/2018 07/26/2017 07/18/2015  PHQ -  2 Score 0 0 0  PHQ- 9 Score 2 0 -    Physical  Exam Vitals signs and nursing note reviewed.  HENT:     Head: Normocephalic.     Right Ear: External ear normal.     Left Ear: External ear normal.     Nose: Nose normal.  Eyes:     General: No scleral icterus.       Right eye: No discharge.        Left eye: No discharge.     Conjunctiva/sclera: Conjunctivae normal.     Pupils: Pupils are equal, round, and reactive to light.  Neck:     Musculoskeletal: Normal range of motion and neck supple.     Thyroid: No thyromegaly.     Vascular: No JVD.     Trachea: No tracheal deviation.  Cardiovascular:     Rate and Rhythm: Normal rate and regular rhythm.     Heart sounds: Normal heart sounds. No murmur. No friction rub. No gallop.   Pulmonary:     Effort: No respiratory distress.     Breath sounds: Normal breath sounds. No wheezing or rales.  Abdominal:     General: Bowel sounds are normal.     Palpations: Abdomen is soft. There is no mass.     Tenderness: There is no abdominal tenderness. There is no guarding or rebound.  Musculoskeletal: Normal range of motion.        General: No tenderness.  Lymphadenopathy:     Cervical: No cervical adenopathy.  Skin:    General: Skin is warm.     Findings: No rash.  Neurological:     Mental Status: He is alert and oriented to person, place, and time.     Cranial Nerves: No cranial nerve deficit.     Deep Tendon Reflexes: Reflexes are normal and symmetric.     BP 110/60   Pulse 80   Ht 6' (1.829 m)   Wt 270 lb (122.5 kg)   BMI 36.62 kg/m   Assessment and Plan:  1. Hyperuricemia Chronic. Controlled. Refill allopurinol - allopurinol (ZYLOPRIM) 100 MG tablet; Take 1 tablet (100 mg total) by mouth daily.  Dispense: 90 tablet; Refill: 1  2. Essential hypertension Chronic. Controlled. Refill amlodipine, losartan and metoprolol - amLODipine (NORVASC) 10 MG tablet; Take 1 tablet (10 mg total) by mouth daily.  Dispense: 90 tablet; Refill: 1 - losartan (COZAAR) 100 MG tablet; Take 1 tablet  (100 mg total) by mouth daily.  Dispense: 90 tablet; Refill: 1 - metoprolol succinate (TOPROL-XL) 100 MG 24 hr tablet; Take 1 tablet (100 mg total) by mouth daily. Take with or immediately following a meal.  Dispense: 90 tablet; Refill: 1  3. Type 2 diabetes mellitus with diabetic nephropathy, without long-term current use of insulin (HCC) Chronic, controlled on med- refill victoza and insulin pen needles - liraglutide (VICTOZA) 18 MG/3ML SOPN; INJECT SUBCUTANEOUSLY 1.8MG  ONCE A DAY  Dispense: 27 mL; Refill: 1 - Insulin Pen Needle (NOVOFINE) 32G X 6 MM MISC; INJECT SUBCUTANEOUSLY 1  NEEDLE DAILY AS DIRECTED/ E11.9  Dispense: 90 each; Refill: 1  4. Mixed hyperlipidemia Chronic, controlled on meds- refill lovastatin - lovastatin (MEVACOR) 20 MG tablet; Take 1 tablet (20 mg total) by mouth daily.  Dispense: 90 tablet; Refill: 1  5. Gastroesophageal reflux disease, esophagitis presence not specified Chronic, controlled on med- refill pantoprazole - pantoprazole (PROTONIX) 40 MG tablet; Take 1 tablet (40 mg total) by mouth daily.  Dispense: 90 tablet; Refill:  1  6. Chronic atrial fibrillation Chronic/ recheck INR on new dose - warfarin (COUMADIN) 2 MG tablet; Take 1 tablet (2 mg total) by mouth daily.  Dispense: 90 tablet; Refill: 1 - INR/PT  7. BMI 36.0-36.9,adult Discussed diet control  8. Current use of anticoagulant therapy Currently taking 4mg  qday of coumadin- will recheck INR

## 2018-07-08 LAB — PROTIME-INR
INR: 3.1 — ABNORMAL HIGH (ref 0.8–1.2)
Prothrombin Time: 29.2 s — ABNORMAL HIGH (ref 9.1–12.0)

## 2018-07-26 LAB — HM DIABETES EYE EXAM

## 2018-07-29 ENCOUNTER — Other Ambulatory Visit: Payer: Self-pay

## 2018-08-04 ENCOUNTER — Other Ambulatory Visit: Payer: Self-pay | Admitting: Family Medicine

## 2018-08-05 LAB — PROTIME-INR
INR: 1.8 — ABNORMAL HIGH (ref 0.8–1.2)
PROTHROMBIN TIME: 17.6 s — AB (ref 9.1–12.0)

## 2018-09-05 ENCOUNTER — Other Ambulatory Visit: Payer: Self-pay | Admitting: Family Medicine

## 2018-09-06 LAB — PROTIME-INR
INR: 1.9 — ABNORMAL HIGH (ref 0.8–1.2)
Prothrombin Time: 18.6 s — ABNORMAL HIGH (ref 9.1–12.0)

## 2018-09-13 DIAGNOSIS — E872 Acidosis, unspecified: Secondary | ICD-10-CM | POA: Insufficient documentation

## 2018-12-14 ENCOUNTER — Other Ambulatory Visit: Payer: Self-pay

## 2018-12-14 DIAGNOSIS — K219 Gastro-esophageal reflux disease without esophagitis: Secondary | ICD-10-CM

## 2018-12-14 DIAGNOSIS — E790 Hyperuricemia without signs of inflammatory arthritis and tophaceous disease: Secondary | ICD-10-CM

## 2018-12-14 DIAGNOSIS — I10 Essential (primary) hypertension: Secondary | ICD-10-CM

## 2018-12-14 DIAGNOSIS — E1121 Type 2 diabetes mellitus with diabetic nephropathy: Secondary | ICD-10-CM

## 2018-12-14 DIAGNOSIS — E782 Mixed hyperlipidemia: Secondary | ICD-10-CM

## 2018-12-14 MED ORDER — PANTOPRAZOLE SODIUM 40 MG PO TBEC: 40.0000 mg | DELAYED_RELEASE_TABLET | Freq: Every day | ORAL | 1 refills | Status: DC

## 2018-12-14 MED ORDER — LOSARTAN POTASSIUM 100 MG PO TABS: 100.0000 mg | ORAL_TABLET | Freq: Every day | ORAL | 1 refills | Status: DC

## 2018-12-14 MED ORDER — LOVASTATIN 20 MG PO TABS: 20.0000 mg | ORAL_TABLET | Freq: Every day | ORAL | 1 refills | Status: DC

## 2018-12-14 MED ORDER — METOPROLOL SUCCINATE ER 100 MG PO TB24: 100.0000 mg | ORAL_TABLET | Freq: Every day | ORAL | 1 refills | Status: DC

## 2018-12-14 MED ORDER — ALLOPURINOL 100 MG PO TABS: 100.0000 mg | ORAL_TABLET | Freq: Every day | ORAL | 1 refills | Status: DC

## 2018-12-14 MED ORDER — LIRAGLUTIDE 18 MG/3ML ~~LOC~~ SOPN: PEN_INJECTOR | SUBCUTANEOUS | 1 refills | Status: DC

## 2018-12-14 MED ORDER — AMLODIPINE BESYLATE 10 MG PO TABS: 10.0000 mg | ORAL_TABLET | Freq: Every day | ORAL | 1 refills | Status: DC

## 2019-01-02 ENCOUNTER — Other Ambulatory Visit: Payer: Self-pay

## 2019-01-02 DIAGNOSIS — I482 Chronic atrial fibrillation, unspecified: Secondary | ICD-10-CM | POA: Diagnosis not present

## 2019-01-02 DIAGNOSIS — R69 Illness, unspecified: Secondary | ICD-10-CM

## 2019-01-02 DIAGNOSIS — N184 Chronic kidney disease, stage 4 (severe): Secondary | ICD-10-CM | POA: Diagnosis not present

## 2019-01-02 DIAGNOSIS — I10 Essential (primary) hypertension: Secondary | ICD-10-CM | POA: Diagnosis not present

## 2019-01-02 DIAGNOSIS — E782 Mixed hyperlipidemia: Secondary | ICD-10-CM | POA: Diagnosis not present

## 2019-01-02 DIAGNOSIS — E1121 Type 2 diabetes mellitus with diabetic nephropathy: Secondary | ICD-10-CM | POA: Diagnosis not present

## 2019-01-02 DIAGNOSIS — R351 Nocturia: Secondary | ICD-10-CM | POA: Diagnosis not present

## 2019-01-02 DIAGNOSIS — E1122 Type 2 diabetes mellitus with diabetic chronic kidney disease: Secondary | ICD-10-CM

## 2019-01-03 LAB — HEMOGLOBIN A1C
Est. average glucose Bld gHb Est-mCnc: 123 mg/dL
Hgb A1c MFr Bld: 5.9 % — ABNORMAL HIGH (ref 4.8–5.6)

## 2019-01-03 LAB — RENAL FUNCTION PANEL
Albumin: 4.2 g/dL (ref 3.8–4.8)
BUN/Creatinine Ratio: 12 (ref 10–24)
BUN: 44 mg/dL — ABNORMAL HIGH (ref 8–27)
CO2: 19 mmol/L — ABNORMAL LOW (ref 20–29)
Calcium: 9.8 mg/dL (ref 8.6–10.2)
Chloride: 103 mmol/L (ref 96–106)
Creatinine, Ser: 3.55 mg/dL — ABNORMAL HIGH (ref 0.76–1.27)
GFR calc Af Amer: 20 mL/min/{1.73_m2} — ABNORMAL LOW (ref >59)
GFR calc non Af Amer: 17 mL/min/{1.73_m2} — ABNORMAL LOW (ref >59)
Glucose: 103 mg/dL — ABNORMAL HIGH (ref 65–99)
Phosphorus: 3.6 mg/dL (ref 2.8–4.1)
Potassium: 3.6 mmol/L (ref 3.5–5.2)
Sodium: 142 mmol/L (ref 134–144)

## 2019-01-03 LAB — HEPATIC FUNCTION PANEL
ALT: 17 IU/L (ref 0–44)
AST: 16 IU/L (ref 0–40)
Alkaline Phosphatase: 75 IU/L (ref 39–117)
Bilirubin Total: 0.5 mg/dL (ref 0.0–1.2)
Bilirubin, Direct: 0.16 mg/dL (ref 0.00–0.40)
Total Protein: 6.2 g/dL (ref 6.0–8.5)

## 2019-01-03 LAB — PSA: Prostate Specific Ag, Serum: 0.2 ng/mL (ref 0.0–4.0)

## 2019-01-03 LAB — LIPID PANEL WITH LDL/HDL RATIO
Cholesterol, Total: 113 mg/dL (ref 100–199)
HDL: 38 mg/dL — ABNORMAL LOW (ref >39)
LDL Calculated: 58 mg/dL (ref 0–99)
LDl/HDL Ratio: 1.5 ratio (ref 0.0–3.6)
Triglycerides: 83 mg/dL (ref 0–149)
VLDL Cholesterol Cal: 17 mg/dL (ref 5–40)

## 2019-01-03 LAB — PROTIME-INR
INR: 1.4 — ABNORMAL HIGH (ref 0.8–1.2)
Prothrombin Time: 14.6 s — ABNORMAL HIGH (ref 9.1–12.0)

## 2019-01-05 IMAGING — US US SCROTUM W/ DOPPLER COMPLETE
1 series · 13 of 25 positions shown · non-contrast
Comparison: None.

CLINICAL DATA: Patient has history of undescended testicles since

EXAM:
SCROTAL ULTRASOUND
DOPPLER ULTRASOUND OF THE TESTICLES
TECHNIQUE: Complete ultrasound examination of the testicles, epididymis, and
other scrotal structures was performed. Color and spectral Doppler
ultrasound were also utilized to evaluate blood flow to the
testicles.

[Series 1: us scrotum w/ doppler complete · 0.07mm/px · 78 acquisitions, 13 frames shown]
[im 1/78]
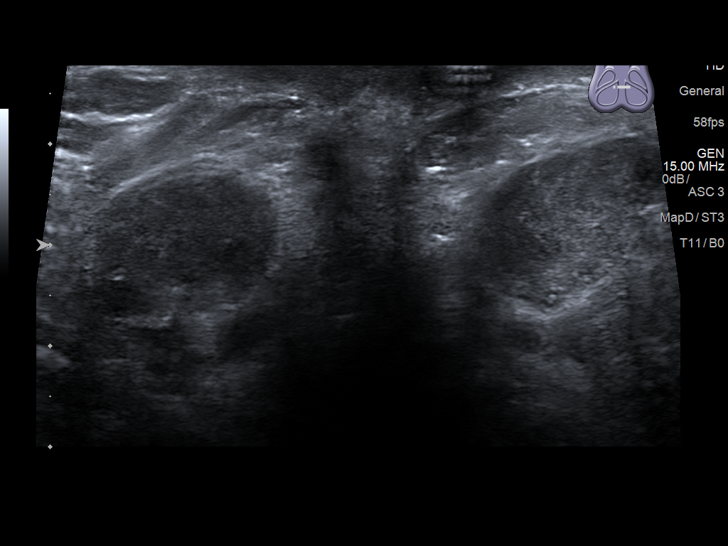
[im 7/78]
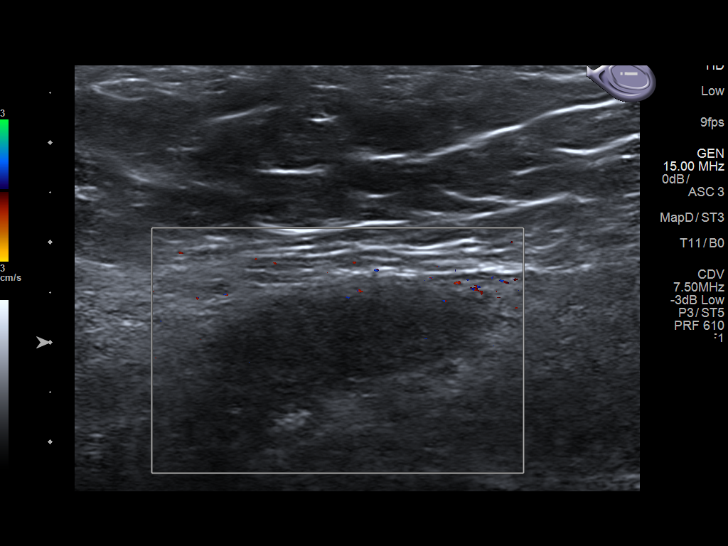
[im 13/78]
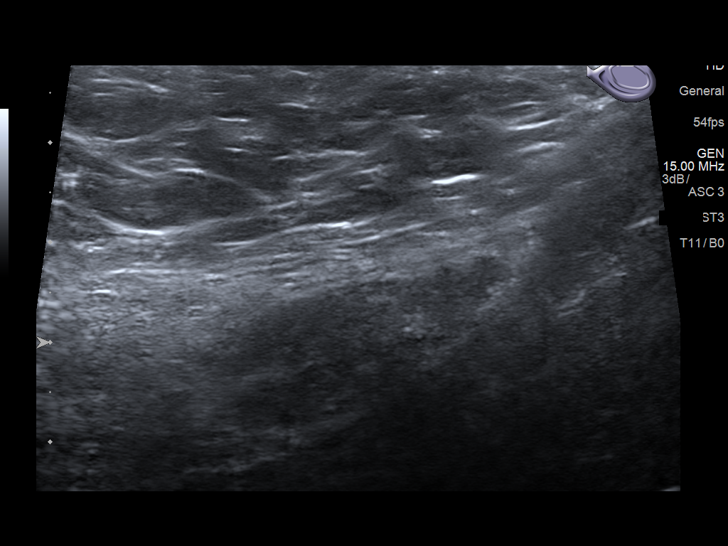
[im 20/78]
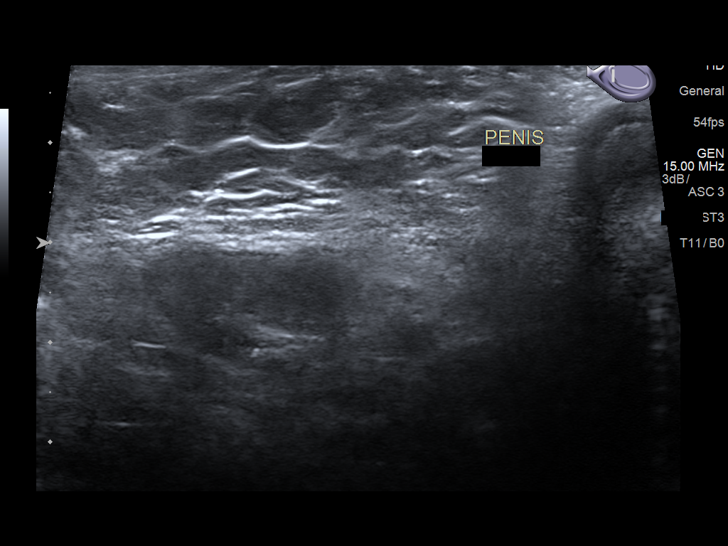
[im 26/78]
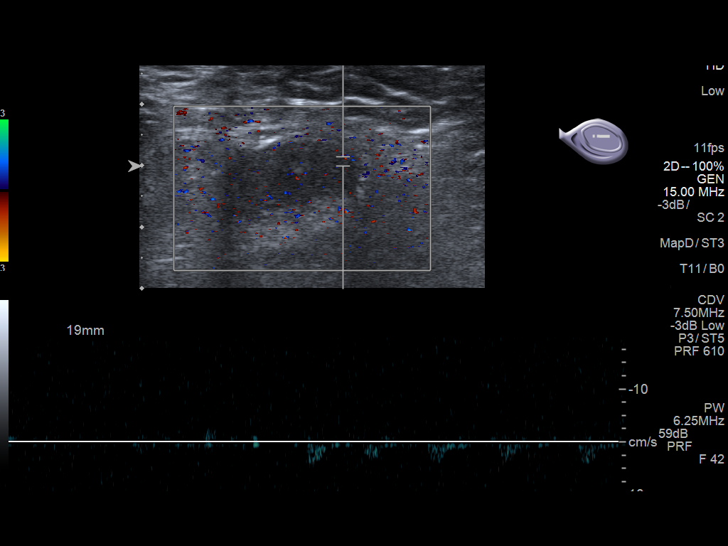
[im 33/78]
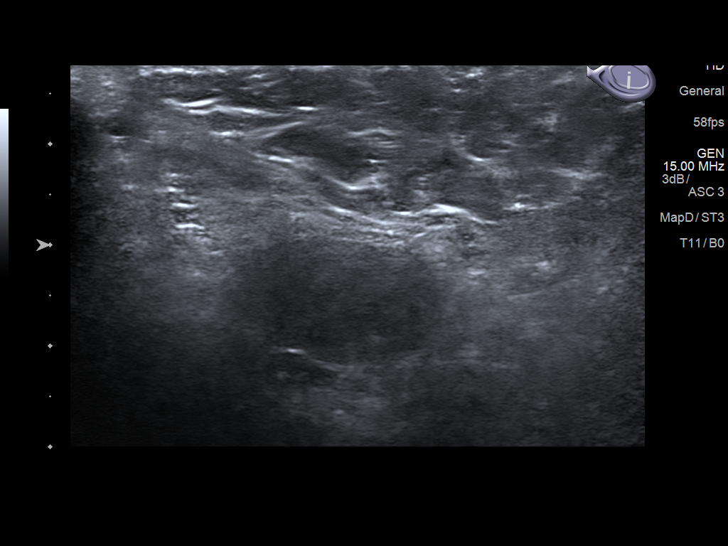
[im 39/78]
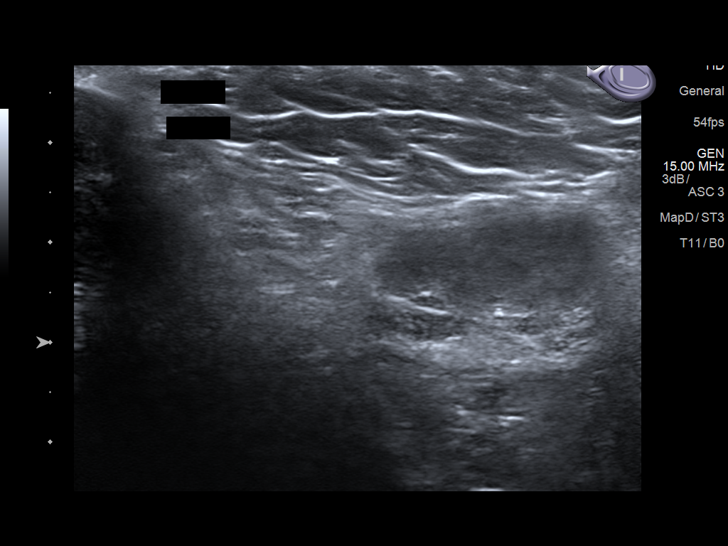
[im 45/78]
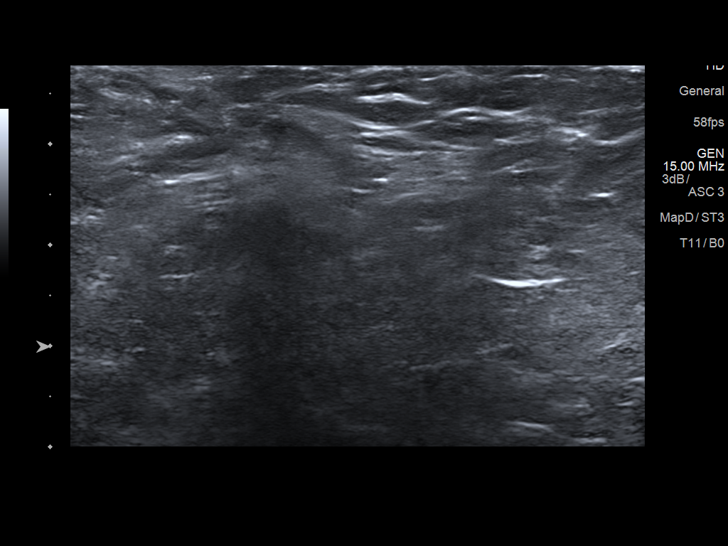
[im 52/78]
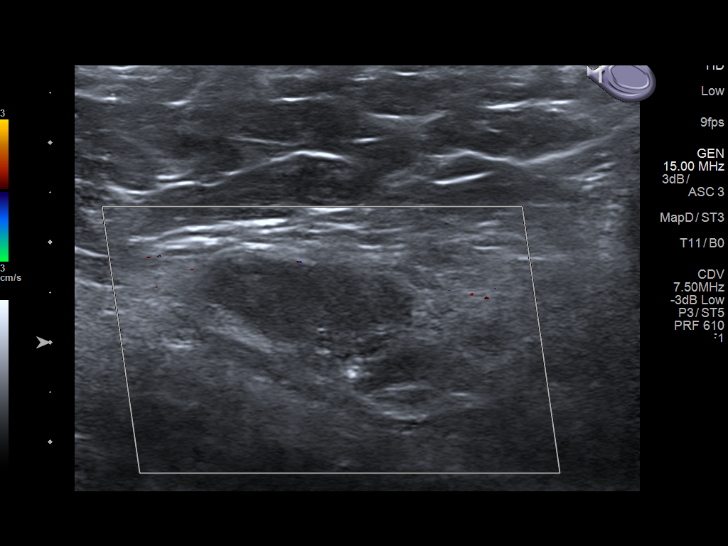
[im 58/78]
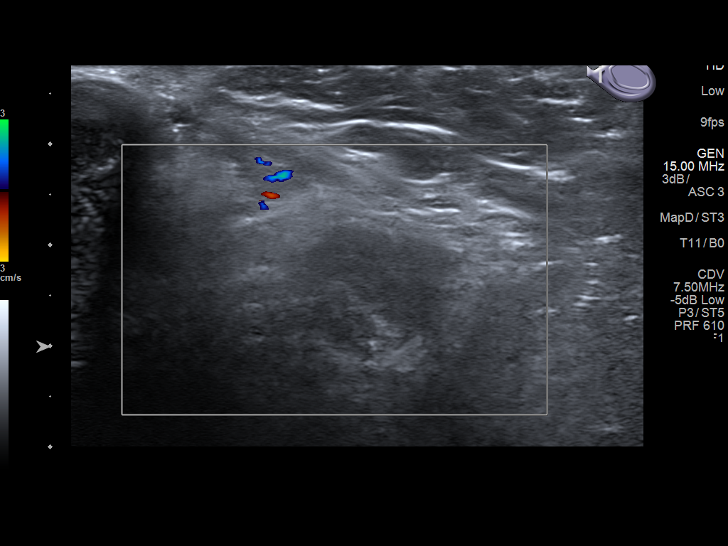
[im 65/78]
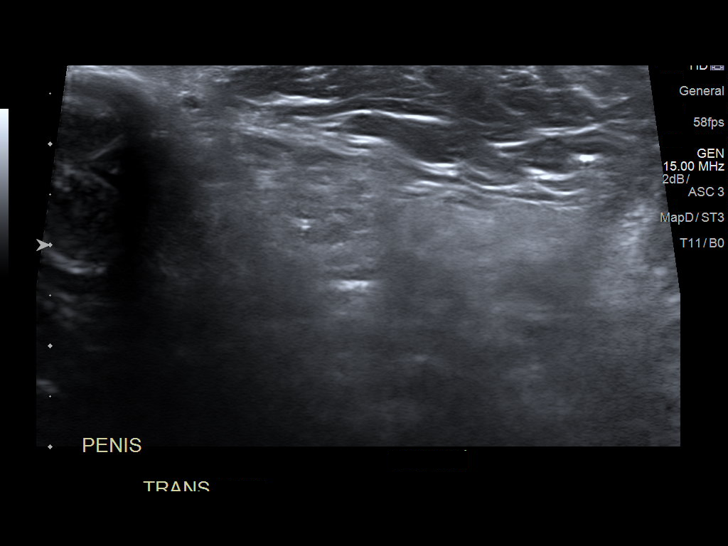
[im 71/78]
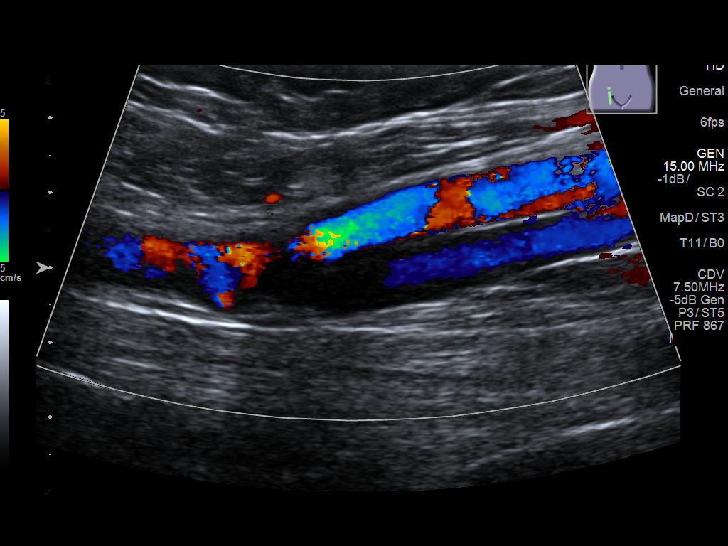
[im 78/78]
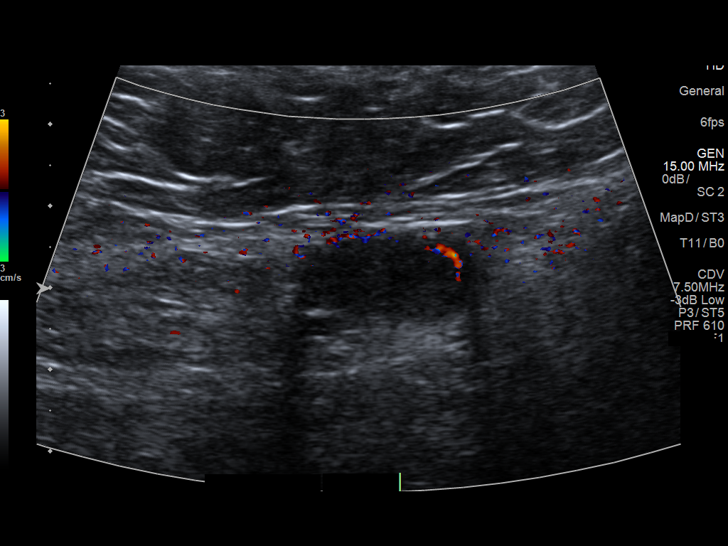

[13 of 25 positions shown; findings below may reference images not displayed]

FINDINGS: Right testicle

Scrotum is redundant skin. Right testicle can be seen when the
camera is touching perineal area and at lateral to the penis base,
near lower inguinal areas. Measurements: 2.9 x 1.1 x 2.1 cm. No mass
or microlithiasis visualized.

Left testicle

Scrotum is redundant skin. Left testicle can be seen when the camera
is touching perineal area and at lateral to the penis base, near
lower inguinal areas. Measurements: 2.1 x 0.9 x 1.7 cm. No mass or
microlithiasis visualized.

Right epididymis: Visualized portions are normal. Epididymal head is
not seen.

Left epididymis: Visualized portions are normal. Epididymal head is
not seen.

Hydrocele:  None visualized.

Varicocele:  None visualized.

Pulsed Doppler interrogation of both testes demonstrates no normal
low resistance arterial and venous waveforms in the right testicle.
Color flow and arterial for flow is identified in the inferior
portion of the left testicle. No venous flow is detected in the left
testicle.
IMPRESSION: Scrotum is redundant skin. Bilateral testis can be seen when the
camera is touching perineal area and at lateral to the penis base,
near lower inguinal areas. The right testicle has no definite blood
flow seen. Arterial blood flow is seen in the inferior portion of
left testicle.

## 2019-01-09 ENCOUNTER — Encounter: Payer: Self-pay | Admitting: Family Medicine

## 2019-01-09 ENCOUNTER — Ambulatory Visit (INDEPENDENT_AMBULATORY_CARE_PROVIDER_SITE_OTHER): Payer: Medicare Other | Admitting: Family Medicine

## 2019-01-09 ENCOUNTER — Other Ambulatory Visit: Payer: Self-pay

## 2019-01-09 VITALS — BP 116/74 | HR 71 | Ht 72.0 in | Wt 251.0 lb

## 2019-01-09 DIAGNOSIS — E790 Hyperuricemia without signs of inflammatory arthritis and tophaceous disease: Secondary | ICD-10-CM

## 2019-01-09 DIAGNOSIS — R634 Abnormal weight loss: Secondary | ICD-10-CM | POA: Diagnosis not present

## 2019-01-09 DIAGNOSIS — I10 Essential (primary) hypertension: Secondary | ICD-10-CM | POA: Diagnosis not present

## 2019-01-09 DIAGNOSIS — K219 Gastro-esophageal reflux disease without esophagitis: Secondary | ICD-10-CM

## 2019-01-09 DIAGNOSIS — N184 Chronic kidney disease, stage 4 (severe): Secondary | ICD-10-CM | POA: Diagnosis not present

## 2019-01-09 DIAGNOSIS — I482 Chronic atrial fibrillation, unspecified: Secondary | ICD-10-CM

## 2019-01-09 DIAGNOSIS — E1122 Type 2 diabetes mellitus with diabetic chronic kidney disease: Secondary | ICD-10-CM

## 2019-01-09 DIAGNOSIS — R69 Illness, unspecified: Secondary | ICD-10-CM

## 2019-01-09 DIAGNOSIS — E782 Mixed hyperlipidemia: Secondary | ICD-10-CM | POA: Diagnosis not present

## 2019-01-09 DIAGNOSIS — E1121 Type 2 diabetes mellitus with diabetic nephropathy: Secondary | ICD-10-CM

## 2019-01-09 MED ORDER — LOVASTATIN 20 MG PO TABS: 20.0000 mg | ORAL_TABLET | Freq: Every day | ORAL | 1 refills | Status: DC

## 2019-01-09 MED ORDER — VICTOZA 18 MG/3ML ~~LOC~~ SOPN: PEN_INJECTOR | SUBCUTANEOUS | 1 refills | Status: DC

## 2019-01-09 MED ORDER — NOVOFINE 32G X 6 MM MISC: 1 refills | Status: DC

## 2019-01-09 MED ORDER — WARFARIN SODIUM 2 MG PO TABS: 2.0000 mg | ORAL_TABLET | Freq: Every day | ORAL | 1 refills | Status: DC

## 2019-01-09 MED ORDER — LOSARTAN POTASSIUM 100 MG PO TABS: 100.0000 mg | ORAL_TABLET | Freq: Every day | ORAL | 1 refills | Status: DC

## 2019-01-09 MED ORDER — HYDROCHLOROTHIAZIDE 25 MG PO TABS: 25.0000 mg | ORAL_TABLET | Freq: Every day | ORAL | 3 refills | Status: DC

## 2019-01-09 MED ORDER — ALLOPURINOL 100 MG PO TABS: 100.0000 mg | ORAL_TABLET | Freq: Every day | ORAL | 1 refills | Status: DC

## 2019-01-09 MED ORDER — WARFARIN SODIUM 5 MG PO TABS: 5.0000 mg | ORAL_TABLET | Freq: Every day | ORAL | 1 refills | Status: DC

## 2019-01-09 MED ORDER — AMLODIPINE BESYLATE 10 MG PO TABS: 10.0000 mg | ORAL_TABLET | Freq: Every day | ORAL | 1 refills | Status: DC

## 2019-01-09 MED ORDER — METOPROLOL SUCCINATE ER 100 MG PO TB24: 100.0000 mg | ORAL_TABLET | Freq: Every day | ORAL | 1 refills | Status: DC

## 2019-01-09 MED ORDER — PANTOPRAZOLE SODIUM 40 MG PO TBEC: 40.0000 mg | DELAYED_RELEASE_TABLET | Freq: Every day | ORAL | 1 refills | Status: DC

## 2019-01-09 NOTE — Progress Notes (Signed)
Date:  01/09/2019   Name:  Mark Bauer   DOB:  May 21, 1956   MRN:  [id number]   Chief Complaint: Coagulation Disorder (Repeat PT/INR in 1 more week.), Diabetes (Follow up. Just had A1C last week.), Hypertension, and Weight Loss (Losing weight and unsure why. No appetite changes. Lost 29 lbs in 6 months.)  Diabetes He presents for his follow-up diabetic visit. He has type 2 diabetes mellitus. His disease course has been stable. There are no hypoglycemic associated symptoms. Pertinent negatives for hypoglycemia include no confusion, dizziness, headaches, nervousness/anxiousness, seizures, speech difficulty, sweats or tremors. Associated symptoms include weight loss. Pertinent negatives for diabetes include no blurred vision, no chest pain, no fatigue, no foot paresthesias, no foot ulcerations, no polydipsia, no polyphagia, no polyuria, no visual change and no weakness. There are no hypoglycemic complications. There are no diabetic complications. Pertinent negatives for diabetic complications include no CVA or retinopathy. Risk factors for coronary artery disease include diabetes mellitus, dyslipidemia, family history, homocysteinemia, hypertension, male sex, obesity, post-menopausal, sedentary lifestyle and stress. Current diabetic treatment includes oral agent (monotherapy). He is compliant with treatment some of the time. His weight is fluctuating dramatically. He is following a generally healthy diet. When asked about meal planning, he reported none. He participates in exercise intermittently. There is no change in his home blood glucose trend. His breakfast blood glucose is taken between 8-9 am. His breakfast blood glucose range is generally 110-130 mg/dl. An ACE inhibitor/angiotensin II receptor blocker is being taken.  Hypertension This is a chronic problem. The current episode started more than 1 year ago. The problem has been rapidly worsening since onset. The problem is controlled. Pertinent  negatives include no anxiety, blurred vision, chest pain, headaches, malaise/fatigue, neck pain, orthopnea, palpitations, peripheral edema, PND, shortness of breath or sweats. There are no associated agents to hypertension. There are no known risk factors for coronary artery disease. Past treatments include diuretics, beta blockers, calcium channel blockers and angiotensin blockers. There are no compliance problems.  Hypertensive end-organ damage includes kidney disease. There is no history of angina, CAD/MI, CVA, heart failure, left ventricular hypertrophy or retinopathy. Identifiable causes of hypertension include a thyroid problem. There is no history of chronic renal disease, a hypertension causing med or renovascular disease.  Hyperlipidemia This is a chronic problem. The current episode started more than 1 year ago. The problem is controlled. Recent lipid tests were reviewed and are normal. Exacerbating diseases include diabetes. He has no history of chronic renal disease. Factors aggravating his hyperlipidemia include thiazides. Pertinent negatives include no chest pain, myalgias or shortness of breath. Current antihyperlipidemic treatment includes statins. The current treatment provides mild improvement of lipids. There are no compliance problems.  There are no known risk factors for coronary artery disease.  Gastroesophageal Reflux He reports no abdominal pain, no belching, no chest pain, no choking, no coughing, no dysphagia, no heartburn, no hoarse voice, no nausea, no sore throat or no wheezing. This is a chronic problem. The problem has been unchanged. The symptoms are aggravated by certain foods. Associated symptoms include weight loss. Pertinent negatives include no fatigue. He has tried a PPI for the symptoms. The treatment provided moderate relief.  Thyroid Problem Presents for initial visit. Symptoms include hair loss, nail problem and weight loss. Patient reports no anxiety, cold  intolerance, constipation, depressed mood, diaphoresis, diarrhea, dry skin, fatigue, heat intolerance, hoarse voice, leg swelling, palpitations, tremors or visual change. His past medical history is significant for atrial fibrillation, diabetes  and hyperlipidemia. There is no history of heart failure.    Review of Systems  Constitutional: Positive for weight loss. Negative for appetite change, chills, diaphoresis, fatigue, fever, malaise/fatigue and unexpected weight change.  HENT: Negative for drooling, ear discharge, ear pain, facial swelling, hearing loss, hoarse voice, nosebleeds, sneezing, sore throat and trouble swallowing.   Eyes: Negative for blurred vision, photophobia, pain, discharge, redness, itching and visual disturbance.  Respiratory: Negative for cough, choking, chest tightness, shortness of breath and wheezing.   Cardiovascular: Negative for chest pain, palpitations, orthopnea, leg swelling and PND.  Gastrointestinal: Negative for abdominal pain, blood in stool, constipation, diarrhea, dysphagia, heartburn, nausea, rectal pain and vomiting.  Endocrine: Negative for cold intolerance, heat intolerance, polydipsia, polyphagia and polyuria.  Genitourinary: Negative for decreased urine volume, difficulty urinating, discharge, dysuria, flank pain, frequency, hematuria, penile pain, penile swelling, scrotal swelling, testicular pain and urgency.  Musculoskeletal: Negative for back pain, joint swelling, myalgias, neck pain and neck stiffness.  Skin: Negative for color change and rash.  Allergic/Immunologic: Negative for environmental allergies and immunocompromised state.  Neurological: Negative for dizziness, tremors, seizures, syncope, speech difficulty, weakness, light-headedness, numbness and headaches.  Hematological: Negative for adenopathy. Does not bruise/bleed easily.  Psychiatric/Behavioral: Negative for agitation, behavioral problems, confusion, dysphoric mood, hallucinations,  self-injury and suicidal ideas. The patient is not nervous/anxious.     Patient Active Problem List   Diagnosis Date Noted  . CKD stage 4 due to type 2 diabetes mellitus ([place]) 01/25/2018  . Type 2 diabetes mellitus with diabetic nephropathy, without long-term current use of insulin ([place]) 01/25/2018  . Chronic atrial fibrillation 01/06/2017  . Type 2 diabetes mellitus without complication, without long-term current use of insulin ([place]) 01/06/2017  . Essential hypertension 01/06/2017  . Hyperuricemia 01/06/2017  . Mixed hyperlipidemia 01/06/2017  . Gastroesophageal reflux disease 01/06/2017    Allergies  Allergen Reactions  . Caffeine   . Ciprofloxacin Other (See Comments)  . Pollen Extract Other (See Comments)    Sneezing, watery eyes, etc.    Past Surgical History:  Procedure Laterality Date  . COLONOSCOPY  2012   hemorrhoids    Social History   Tobacco Use  . Smoking status: Never Smoker  . Smokeless tobacco: Never Used  Substance Use Topics  . Alcohol use: No    Alcohol/week: 0.0 standard drinks  . Drug use: No     Medication list has been reviewed and updated.  Current Meds  Medication Sig  . allopurinol (ZYLOPRIM) 100 MG tablet Take 1 tablet (100 mg total) by mouth daily.  Marland Kitchen amLODipine (NORVASC) 10 MG tablet Take 1 tablet (10 mg total) by mouth daily.  . Cholecalciferol (D3 SUPER STRENGTH) 2000 units CAPS Take 1 capsule by mouth as directed. M-Friday  . furosemide (LASIX) 40 MG tablet Take 40 mg by mouth. PRN- Dr. Percell Miller- causes to vomit  . Insulin Pen Needle (NOVOFINE) 32G X 6 MM MISC INJECT SUBCUTANEOUSLY 1  NEEDLE DAILY AS DIRECTED/ E11.9  . liraglutide (VICTOZA) 18 MG/3ML SOPN INJECT SUBCUTANEOUSLY 1.8MG  ONCE A DAY  . loratadine (CLARITIN) 10 MG tablet Take 10 mg by mouth daily. otc  . losartan (COZAAR) 100 MG tablet Take 1 tablet (100 mg total) by mouth daily.  Marland Kitchen lovastatin (MEVACOR) 20 MG tablet Take 1 tablet (20 mg total) by mouth daily.  . metoprolol  succinate (TOPROL-XL) 100 MG 24 hr tablet Take 1 tablet (100 mg total) by mouth daily. Take with or immediately following a meal.  . pantoprazole (PROTONIX) 40 MG  tablet Take 1 tablet (40 mg total) by mouth daily.  . potassium chloride (K-DUR,KLOR-CON) 10 MEQ tablet Take 1 tablet by mouth 2 (two) times daily. Nephrology  . warfarin (COUMADIN) 2 MG tablet Take 1 tablet (2 mg total) by mouth daily.  Marland Kitchen warfarin (COUMADIN) 4 MG tablet Take 1 tablet by mouth daily.  Marland Kitchen warfarin (COUMADIN) 5 MG tablet TAKE 1 TABLET BY MOUTH  DAILY    PHQ 2/9 Scores 01/09/2019 07/07/2018 07/26/2017 07/18/2015  PHQ - 2 Score 0 0 0 0  PHQ- 9 Score - 2 0 -    BP Readings from Last 3 Encounters:  01/09/19 116/74  07/07/18 110/60  02/24/18 133/72    Physical Exam Vitals signs and nursing note reviewed.  HENT:     Head: Normocephalic.     Right Ear: Tympanic membrane and external ear normal.     Left Ear: Tympanic membrane, ear canal and external ear normal.     Nose: Nose normal.  Eyes:     General: No scleral icterus.       Right eye: No discharge.        Left eye: No discharge.     Conjunctiva/sclera: Conjunctivae normal.     Pupils: Pupils are equal, round, and reactive to light.  Neck:     Musculoskeletal: Normal range of motion and neck supple.     Thyroid: No thyromegaly.     Vascular: No JVD.     Trachea: No tracheal deviation.  Cardiovascular:     Rate and Rhythm: Normal rate and regular rhythm.     Heart sounds: Normal heart sounds. No murmur. No friction rub. No gallop.   Pulmonary:     Effort: No respiratory distress.     Breath sounds: Normal breath sounds. No wheezing or rales.  Abdominal:     General: Bowel sounds are normal. There is no distension.     Palpations: Abdomen is soft. There is no mass.     Tenderness: There is no abdominal tenderness. There is no guarding or rebound.  Musculoskeletal: Normal range of motion.        General: No tenderness.  Lymphadenopathy:     Cervical:  No cervical adenopathy.  Skin:    General: Skin is warm.     Findings: No rash.  Neurological:     Mental Status: He is alert and oriented to person, place, and time.     Cranial Nerves: No cranial nerve deficit.     Deep Tendon Reflexes: Reflexes are normal and symmetric.     Wt Readings from Last 3 Encounters:  01/09/19 251 lb (113.9 kg)  07/07/18 270 lb (122.5 kg)  02/24/18 261 lb (118.4 kg)    BP 116/74   Pulse 71   Ht 6' (1.829 m)   Wt 251 lb (113.9 kg)   SpO2 97%   BMI 34.04 kg/m   Assessment and Plan: 1. Type 2 diabetes mellitus with diabetic nephropathy, without long-term current use of insulin (HCC) Chronic.  Controlled.  Continue Victoza 1.8 mg once a day.  Patient has excellent control on this unfortunately evolved into Medicare which means there is a pending donut hole and the expense of the Victoza may be prohibitive.  We will continue until this happens at which time we may have to come up with a different plan of control.  View of A1c's are in the 5 range and are excellent which is unfortunate given the current status of diabetic medication payment by Medicare. -  liraglutide (VICTOZA) 18 MG/3ML SOPN; INJECT SUBCUTANEOUSLY 1.8MG  ONCE A DAY  Dispense: 27 mL; Refill: 1 - Insulin Pen Needle (NOVOFINE) 32G X 6 MM MISC; INJECT SUBCUTANEOUSLY 1  NEEDLE DAILY AS DIRECTED/ E11.9  Dispense: 90 each; Refill: 1  2. CKD stage 4 due to type 2 diabetes mellitus (HCC) Chronic.  Allowed by nephrology.  Patient has undergone mapping for AV fistula creation.  GFR is in the 20s.  This is likely due to previous medical concerns as well as his polycystic kidney disease.  3. Chronic atrial fibrillation Chronic.  Stable.  No breakthrough tachyarrhythmia.  Patient is under antithrombotic control with warfarin.  Will check NR's on a 2 to 4-week. - warfarin (COUMADIN) 2 MG tablet; Take 1 tablet (2 mg total) by mouth daily.  Dispense: 90 tablet; Refill: 1 - warfarin (COUMADIN) 5 MG tablet;  Take 1 tablet (5 mg total) by mouth daily.  Dispense: 90 tablet; Refill: 1  4. Essential hypertension Chronic.  Controlled.  Metoprolol 100 mg once a day losartan 100 mg once a day amlodipine 10 mg once a day will be continued.  Recently patient will switch from Dyazide to Lasix by nephrology.  However patient's not been able to take the second Lasix pill on consecutive days due to nausea therefore we will alternate Lasix with 25 mg of hydrochlorothiazide to see if this is a better regimen that can be tolerated. - metoprolol succinate (TOPROL-XL) 100 MG 24 hr tablet; Take 1 tablet (100 mg total) by mouth daily. Take with or immediately following a meal.  Dispense: 90 tablet; Refill: 1 - losartan (COZAAR) 100 MG tablet; Take 1 tablet (100 mg total) by mouth daily.  Dispense: 90 tablet; Refill: 1 - amLODipine (NORVASC) 10 MG tablet; Take 1 tablet (10 mg total) by mouth daily.  Dispense: 90 tablet; Refill: 1  5. Gastroesophageal reflux disease, esophagitis presence not specified Chronic.  Controlled.  Continue pantoprazole 40 mg once a day - pantoprazole (PROTONIX) 40 MG tablet; Take 1 tablet (40 mg total) by mouth daily.  Dispense: 90 tablet; Refill: 1  6. Hyperuricemia .  Controlled.  Continue allopurinol 100 mg once a day - allopurinol (ZYLOPRIM) 100 MG tablet; Take 1 tablet (100 mg total) by mouth daily.  Dispense: 90 tablet; Refill: 1  7. Mixed hyperlipidemia .  Controlled.  Continue lovastatin and diet once a day - lovastatin (MEVACOR) 20 MG tablet; Take 1 tablet (20 mg total) by mouth daily.  Dispense: 90 tablet; Refill: 1  8. Weight loss, unintentional She was noted to have a 20 pound weight loss over the past months.  Patient says he has been more selective in his diet however I am not certain that this is patient explanation of his recent weight loss.  At this point time we will do further evaluation with CBC, hepatic function panel, LDH, and thyroid panel.  Previous labs were reviewed  and noted to be in reasonable range. - CBC with Differential/Platelet - Thyroid Panel With TSH - Lactate Dehydrogenase (LDH) - Hepatic Function Panel (6)  9. Taking medication for chronic disease Patient taking medications which can affect liver function and we will check hepatic function for evaluation. - Lactate Dehydrogenase (LDH) - Hepatic Function Panel (6)

## 2019-01-10 LAB — HEPATIC FUNCTION PANEL (6)
ALT: 18 IU/L (ref 0–44)
AST: 18 IU/L (ref 0–40)
Albumin: 4.5 g/dL (ref 3.8–4.8)
Alkaline Phosphatase: 87 IU/L (ref 39–117)
Bilirubin Total: 0.6 mg/dL (ref 0.0–1.2)
Bilirubin, Direct: 0.18 mg/dL (ref 0.00–0.40)

## 2019-01-10 LAB — CBC WITH DIFFERENTIAL/PLATELET
Basophils Absolute: 0.1 10*3/uL (ref 0.0–0.2)
Basos: 1 %
EOS (ABSOLUTE): 0.2 10*3/uL (ref 0.0–0.4)
Eos: 2 %
Hematocrit: 38.2 % (ref 37.5–51.0)
Hemoglobin: 13.1 g/dL (ref 13.0–17.7)
Immature Grans (Abs): 0 10*3/uL (ref 0.0–0.1)
Immature Granulocytes: 0 %
Lymphocytes Absolute: 1.3 10*3/uL (ref 0.7–3.1)
Lymphs: 18 %
MCH: 33.5 pg — ABNORMAL HIGH (ref 26.6–33.0)
MCHC: 34.3 g/dL (ref 31.5–35.7)
MCV: 98 fL — ABNORMAL HIGH (ref 79–97)
Monocytes Absolute: 0.7 10*3/uL (ref 0.1–0.9)
Monocytes: 9 %
Neutrophils Absolute: 4.9 10*3/uL (ref 1.4–7.0)
Neutrophils: 70 %
Platelets: 172 10*3/uL (ref 150–450)
RBC: 3.91 x10E6/uL — ABNORMAL LOW (ref 4.14–5.80)
RDW: 13.8 % (ref 11.6–15.4)
WBC: 7.1 10*3/uL (ref 3.4–10.8)

## 2019-01-10 LAB — THYROID PANEL WITH TSH
Free Thyroxine Index: 2.3 (ref 1.2–4.9)
T3 Uptake Ratio: 29 % (ref 24–39)
T4, Total: 7.8 ug/dL (ref 4.5–12.0)
TSH: 3.04 u[IU]/mL (ref 0.450–4.500)

## 2019-01-10 LAB — LACTATE DEHYDROGENASE: LDH: 165 IU/L (ref 121–224)

## 2019-01-27 ENCOUNTER — Other Ambulatory Visit: Payer: Self-pay | Admitting: Family Medicine

## 2019-01-27 DIAGNOSIS — I4891 Unspecified atrial fibrillation: Secondary | ICD-10-CM | POA: Diagnosis not present

## 2019-01-27 DIAGNOSIS — Z7901 Long term (current) use of anticoagulants: Secondary | ICD-10-CM | POA: Diagnosis not present

## 2019-01-28 LAB — PROTIME-INR
INR: 1.8 — ABNORMAL HIGH (ref 0.8–1.2)
Prothrombin Time: 18.7 s — ABNORMAL HIGH (ref 9.1–12.0)

## 2019-02-06 ENCOUNTER — Other Ambulatory Visit: Payer: Self-pay

## 2019-02-13 ENCOUNTER — Other Ambulatory Visit: Payer: Medicare Other

## 2019-02-13 ENCOUNTER — Other Ambulatory Visit: Payer: Self-pay

## 2019-02-13 DIAGNOSIS — I482 Chronic atrial fibrillation, unspecified: Secondary | ICD-10-CM

## 2019-02-14 LAB — PROTIME-INR
INR: 3.3 — ABNORMAL HIGH (ref 0.8–1.2)
Prothrombin Time: 33.2 s — ABNORMAL HIGH (ref 9.1–12.0)

## 2019-02-28 NOTE — Progress Notes (Signed)
03/01/2019 9:12 AM   Mark Bauer September 21, 1955 [id number]  Referring provider: Juline Patch, MD [address] [place],  [place] [address]  Chief Complaint  Patient presents with  . Follow-up    HPI: Patient is a 63 year old male who presents today for undescended testicles.    He states he saw an urologist many years ago and was told he could have a surgery, but he couldn't afford it.  He states he can feel them from time to time in the inguinal canal.   He has not fathered children and did not want to pursue fertility treatments.      Scrotal ultrasound on 02/22/2018 noted scrotum is redundant skin. Bilateral testis can be seen when the camera is touching perineal area and at lateral to the penis base, near lower inguinal areas. The right testicle has no definite blood flow seen. Arterial blood flow is seen in the inferior portion of left testicle.  He states that his can feel the testicles from time to time and have not noticed any abnormalities.  He has no urinary complaints.  Patient denies any gross hematuria, dysuria or suprapubic/flank pain.  Patient denies any fevers, chills, nausea or vomiting.   PMH: Past Medical History:  Diagnosis Date  . Diabetes mellitus without complication ([place])   . Gout   . Hypertension   . Hypokalemia   . Polycystic kidney   . Vitamin D deficiency     Surgical History: Past Surgical History:  Procedure Laterality Date  . COLONOSCOPY  2012   hemorrhoids    Home Medications:  Allergies as of 03/01/2019      Reactions   Caffeine    Ciprofloxacin Other (See Comments)   Pollen Extract Other (See Comments)   Sneezing, watery eyes, etc.      Medication List       Accurate as of March 01, 2019  9:12 AM. If you have any questions, ask your nurse or doctor.        allopurinol 100 MG tablet Commonly known as: ZYLOPRIM Take 1 tablet (100 mg total) by mouth daily.   amLODipine 10 MG tablet Commonly known as: NORVASC  Take 1 tablet (10 mg total) by mouth daily.   D3 Super Strength 50 MCG (2000 UT) Caps Generic drug: Cholecalciferol Take 1 capsule by mouth as directed. M-Friday   hydrochlorothiazide 25 MG tablet Commonly known as: HYDRODIURIL Take 1 tablet (25 mg total) by mouth daily.   loratadine 10 MG tablet Commonly known as: CLARITIN Take 10 mg by mouth daily. otc   losartan 100 MG tablet Commonly known as: COZAAR Take 1 tablet (100 mg total) by mouth daily.   lovastatin 20 MG tablet Commonly known as: MEVACOR Take 1 tablet (20 mg total) by mouth daily.   metoprolol succinate 100 MG 24 hr tablet Commonly known as: TOPROL-XL Take 1 tablet (100 mg total) by mouth daily. Take with or immediately following a meal.   NovoFine 32G X 6 MM Misc Generic drug: Insulin Pen Needle INJECT SUBCUTANEOUSLY 1  NEEDLE DAILY AS DIRECTED/ E11.9   pantoprazole 40 MG tablet Commonly known as: PROTONIX Take 1 tablet (40 mg total) by mouth daily.   potassium chloride 10 MEQ tablet Commonly known as: K-DUR   Victoza 18 MG/3ML Sopn Generic drug: liraglutide INJECT SUBCUTANEOUSLY 1.8MG  ONCE A DAY   warfarin 4 MG tablet Commonly known as: COUMADIN Take 1 tablet by mouth daily.   warfarin 2 MG tablet  Commonly known as: COUMADIN Take 1 tablet (2 mg total) by mouth daily.   warfarin 5 MG tablet Commonly known as: COUMADIN Take 1 tablet (5 mg total) by mouth daily.       Allergies:  Allergies  Allergen Reactions  . Caffeine   . Ciprofloxacin Other (See Comments)  . Pollen Extract Other (See Comments)    Sneezing, watery eyes, etc.    Family History: Family History  Problem Relation Age of Onset  . Diabetes Mother   . Heart disease Father     Social History:  reports that he has never smoked. He has never used smokeless tobacco. He reports that he does not drink alcohol or use drugs.  ROS: UROLOGY Frequent Urination?: No Hard to postpone urination?: No Burning/pain with  urination?: No Get up at night to urinate?: No Leakage of urine?: No Urine stream starts and stops?: No Trouble starting stream?: No Do you have to strain to urinate?: No Blood in urine?: No Urinary tract infection?: No Sexually transmitted disease?: No Injury to kidneys or bladder?: No Painful intercourse?: No Weak stream?: No Erection problems?: No Penile pain?: No  Gastrointestinal Nausea?: No Vomiting?: No Indigestion/heartburn?: No Diarrhea?: No Constipation?: No  Constitutional Fever: No Night sweats?: No Weight loss?: No Fatigue?: No  Skin Skin rash/lesions?: No Itching?: No  Eyes Blurred vision?: No Double vision?: No  Ears/Nose/Throat Sore throat?: No Sinus problems?: No  Hematologic/Lymphatic Swollen glands?: No Easy bruising?: No  Cardiovascular Leg swelling?: No Chest pain?: No  Respiratory Cough?: No Shortness of breath?: No  Endocrine Excessive thirst?: No  Musculoskeletal Back pain?: No Joint pain?: No  Neurological Headaches?: No Dizziness?: No  Psychologic Depression?: No Anxiety?: No  Physical Exam: BP 119/69 (BP Location: Left Arm, Patient Position: Sitting, Cuff Size: Normal)   Pulse 87   Ht 6' (1.829 m)   Wt 250 lb 1.6 oz (113.4 kg)   BMI 33.92 kg/m   Constitutional:  Well nourished. Alert and oriented, No acute distress. HEENT: [place] AT, moist mucus membranes.  Trachea midline, no masses. Cardiovascular: No clubbing, cyanosis, or edema. Respiratory: Normal respiratory effort, no increased work of breathing. GI: Abdomen is soft, non tender, non distended, no abdominal masses. Liver and spleen not palpable.  No hernias appreciated.  Stool sample for occult testing is not indicated.   GU: No CVA tenderness.  No bladder fullness or masses.  Patient with buried phallus.  Foreskin easily retracted  Urethral meatus is patent.  No penile discharge.  No penile lesions or rashes. Scrotum without lesions, cysts, rashes and/or  edema.  Could not palpate the testicles in the scrotum or the inguinal canals.  Rectal: Deferred Skin: No rashes, bruises or suspicious lesions. Lymph: No inguinal adenopathy. Neurologic: Grossly intact, no focal deficits, moving all 4 extremities. Psychiatric: Normal mood and affect.  Laboratory Data: Lab Results  Component Value Date   WBC 7.1 01/09/2019   HGB 13.1 01/09/2019   HCT 38.2 01/09/2019   MCV 98 (H) 01/09/2019   PLT 172 01/09/2019    Lab Results  Component Value Date   CREATININE 3.55 (H) 01/02/2019    No results found for: PSA  No results found for: TESTOSTERONE  Lab Results  Component Value Date   HGBA1C 5.9 (H) 01/02/2019    Lab Results  Component Value Date   TSH 3.040 01/09/2019       Component Value Date/Time   CHOL 113 01/02/2019 1042   HDL 38 (L) 01/02/2019 1042   CHOLHDL  3.7 12/16/2016 1033   LDLCALC 58 01/02/2019 1042    Lab Results  Component Value Date   AST 18 01/09/2019   Lab Results  Component Value Date   ALT 18 01/09/2019   No components found for: ALKALINEPHOPHATASE No components found for: BILIRUBINTOTAL  No results found for: ESTRADIOL  Urinalysis No results found for: COLORURINE, APPEARANCEUR, LABSPEC, PHURINE, GLUCOSEU, HGBUR, BILIRUBINUR, KETONESUR, PROTEINUR, UROBILINOGEN, NITRITE, LEUKOCYTESUR  I have reviewed the labs.  Pertinent Imaging  CLINICAL DATA:  Undescended testicles  EXAM: SCROTAL ULTRASOUND  DOPPLER ULTRASOUND OF THE TESTICLES  TECHNIQUE: Complete ultrasound examination of the testicles, epididymis, and other scrotal structures was performed. Color and spectral Doppler ultrasound were also utilized to evaluate blood flow to the testicles.  COMPARISON:  Ultrasound February 22, 2018  FINDINGS: Right testicle  Measurements: 2.9 x 0.9 x 2.0 cm. Right testicle is positioned within the inguinal canal. No mass or microlithiasis visualized.  Left testicle  Measurements: 2.7 x 1.2 x 1.9  cm. Left testicle is positioned within the inguinal canal. No mass or microlithiasis visualized.  Right epididymis:  Normal in size and appearance.  Left epididymis:  Normal in size and appearance.  Hydrocele:  None visualized.  Varicocele:  None visualized.  Pulsed Doppler interrogation of both testes demonstrates normal low resistance arterial waveforms. Isolated venous waveforms were difficult to obtain.  IMPRESSION: Both testes are position within the inguinal canals. No testicular masses are identified.  Normal arterial waveforms are visualized in both testes. Isolated venous waveforms are difficult to ascertain given high positioning.   Electronically Signed   By: Lovena Le M.D.   On: 03/13/2019 22:18 I have independently reviewed the films and appreciate the testicles in the inguinal area  Assessment & Plan:    1. Undescended testicles Reinforced to the patient that undescended testicles carry a higher risk for testicular cancer He declines any surgery or referral to an endocrinologist at this time He did agree to a scrotal ultrasound to evaluate his testicles - no testicular masses identified RTC in one year for scrotal ultrasound   Return for I will call patient with results.  These notes generated with voice recognition software. I apologize for typographical errors.  Zara Council, PA-C  [facility] [address]  [place], [place] [address] [phone]

## 2019-03-01 ENCOUNTER — Other Ambulatory Visit: Payer: Self-pay | Admitting: Family Medicine

## 2019-03-01 ENCOUNTER — Encounter: Payer: Self-pay | Admitting: Urology

## 2019-03-01 ENCOUNTER — Other Ambulatory Visit: Payer: Self-pay

## 2019-03-01 ENCOUNTER — Ambulatory Visit (INDEPENDENT_AMBULATORY_CARE_PROVIDER_SITE_OTHER): Payer: Medicare Other | Admitting: Urology

## 2019-03-01 VITALS — BP 119/69 | HR 87 | Ht 72.0 in | Wt 250.1 lb

## 2019-03-01 DIAGNOSIS — Q5310 Unspecified undescended testicle, unilateral: Secondary | ICD-10-CM | POA: Diagnosis not present

## 2019-03-01 DIAGNOSIS — Z7901 Long term (current) use of anticoagulants: Secondary | ICD-10-CM | POA: Diagnosis not present

## 2019-03-01 DIAGNOSIS — I4891 Unspecified atrial fibrillation: Secondary | ICD-10-CM | POA: Diagnosis not present

## 2019-03-01 DIAGNOSIS — Q613 Polycystic kidney, unspecified: Secondary | ICD-10-CM | POA: Insufficient documentation

## 2019-03-02 LAB — PROTIME-INR
INR: 2.5 — ABNORMAL HIGH (ref 0.8–1.2)
Prothrombin Time: 25.6 s — ABNORMAL HIGH (ref 9.1–12.0)

## 2019-03-13 ENCOUNTER — Ambulatory Visit
Admission: RE | Admit: 2019-03-13 | Discharge: 2019-03-13 | Disposition: A | Discharge status: Home / Self Care | Payer: Medicare Other | Source: Ambulatory Visit | Attending: Urology | Admitting: Urology

## 2019-03-13 ENCOUNTER — Other Ambulatory Visit: Payer: Self-pay

## 2019-03-13 DIAGNOSIS — Q5320 Undescended testicle, unspecified, bilateral: Secondary | ICD-10-CM | POA: Diagnosis not present

## 2019-03-13 DIAGNOSIS — Q5310 Unspecified undescended testicle, unilateral: Secondary | ICD-10-CM | POA: Diagnosis not present

## 2019-03-14 ENCOUNTER — Other Ambulatory Visit: Payer: Self-pay | Admitting: Urology

## 2019-03-14 ENCOUNTER — Telehealth: Payer: Self-pay | Admitting: Family Medicine

## 2019-03-14 DIAGNOSIS — Q5310 Unspecified undescended testicle, unilateral: Secondary | ICD-10-CM

## 2019-03-14 NOTE — Progress Notes (Unsigned)
Please schedule Mark Bauer for a yearly follow up with a scrotal ultrasound prior.

## 2019-03-14 NOTE — Telephone Encounter (Signed)
Patient notified and voiced understanding.

## 2019-03-14 NOTE — Telephone Encounter (Signed)
-----   Message from Nori Riis, PA-C sent at 03/14/2019  7:42 AM EDT ----- Please let Mr. Hostetler know that his scrotal ultrasound was negative.  We will see him next year.

## 2019-03-29 ENCOUNTER — Other Ambulatory Visit: Payer: Medicare Other

## 2019-03-29 DIAGNOSIS — I482 Chronic atrial fibrillation, unspecified: Secondary | ICD-10-CM

## 2019-03-29 NOTE — Progress Notes (Signed)
Printed lab

## 2019-03-30 LAB — PROTIME-INR
INR: 3.2 — ABNORMAL HIGH (ref 0.8–1.2)
Prothrombin Time: 32.7 s — ABNORMAL HIGH (ref 9.1–12.0)

## 2019-05-02 ENCOUNTER — Other Ambulatory Visit: Payer: Medicare Other

## 2019-05-02 ENCOUNTER — Other Ambulatory Visit: Payer: Self-pay

## 2019-05-02 DIAGNOSIS — E1121 Type 2 diabetes mellitus with diabetic nephropathy: Secondary | ICD-10-CM | POA: Diagnosis not present

## 2019-05-02 DIAGNOSIS — I482 Chronic atrial fibrillation, unspecified: Secondary | ICD-10-CM | POA: Diagnosis not present

## 2019-05-02 NOTE — Progress Notes (Signed)
Labs drawn

## 2019-05-03 LAB — HEMOGLOBIN A1C
Est. average glucose Bld gHb Est-mCnc: 137 mg/dL
Hgb A1c MFr Bld: 6.4 % — ABNORMAL HIGH (ref 4.8–5.6)

## 2019-05-03 LAB — PROTIME-INR
INR: 2.3 — ABNORMAL HIGH (ref 0.9–1.2)
Prothrombin Time: 23.6 s — ABNORMAL HIGH (ref 9.1–12.0)

## 2019-05-03 LAB — MICROALBUMIN, URINE: Microalbumin, Urine: 199.3 ug/mL

## 2019-05-04 DIAGNOSIS — N184 Chronic kidney disease, stage 4 (severe): Secondary | ICD-10-CM | POA: Diagnosis not present

## 2019-05-04 DIAGNOSIS — D638 Anemia in other chronic diseases classified elsewhere: Secondary | ICD-10-CM | POA: Diagnosis not present

## 2019-05-04 DIAGNOSIS — Q613 Polycystic kidney, unspecified: Secondary | ICD-10-CM | POA: Diagnosis not present

## 2019-05-04 DIAGNOSIS — E872 Acidosis: Secondary | ICD-10-CM | POA: Diagnosis not present

## 2019-05-04 DIAGNOSIS — D631 Anemia in chronic kidney disease: Secondary | ICD-10-CM | POA: Diagnosis not present

## 2019-05-04 DIAGNOSIS — I10 Essential (primary) hypertension: Secondary | ICD-10-CM | POA: Diagnosis not present

## 2019-05-04 DIAGNOSIS — Z23 Encounter for immunization: Secondary | ICD-10-CM | POA: Diagnosis not present

## 2019-05-04 DIAGNOSIS — N179 Acute kidney failure, unspecified: Secondary | ICD-10-CM | POA: Diagnosis not present

## 2019-05-17 DIAGNOSIS — E559 Vitamin D deficiency, unspecified: Secondary | ICD-10-CM | POA: Diagnosis not present

## 2019-05-17 DIAGNOSIS — Z6834 Body mass index (BMI) 34.0-34.9, adult: Secondary | ICD-10-CM | POA: Diagnosis not present

## 2019-05-17 DIAGNOSIS — Q613 Polycystic kidney, unspecified: Secondary | ICD-10-CM | POA: Diagnosis not present

## 2019-05-17 DIAGNOSIS — N184 Chronic kidney disease, stage 4 (severe): Secondary | ICD-10-CM | POA: Diagnosis not present

## 2019-05-17 DIAGNOSIS — E669 Obesity, unspecified: Secondary | ICD-10-CM | POA: Diagnosis not present

## 2019-05-17 DIAGNOSIS — D631 Anemia in chronic kidney disease: Secondary | ICD-10-CM | POA: Diagnosis not present

## 2019-05-17 DIAGNOSIS — E872 Acidosis: Secondary | ICD-10-CM | POA: Diagnosis not present

## 2019-05-17 DIAGNOSIS — I10 Essential (primary) hypertension: Secondary | ICD-10-CM | POA: Diagnosis not present

## 2019-05-17 DIAGNOSIS — N2581 Secondary hyperparathyroidism of renal origin: Secondary | ICD-10-CM | POA: Diagnosis not present

## 2019-05-17 DIAGNOSIS — M1A9XX0 Chronic gout, unspecified, without tophus (tophi): Secondary | ICD-10-CM | POA: Diagnosis not present

## 2019-05-17 DIAGNOSIS — I482 Chronic atrial fibrillation, unspecified: Secondary | ICD-10-CM | POA: Diagnosis not present

## 2019-05-17 DIAGNOSIS — E119 Type 2 diabetes mellitus without complications: Secondary | ICD-10-CM | POA: Diagnosis not present

## 2019-06-09 ENCOUNTER — Other Ambulatory Visit: Payer: Self-pay

## 2019-06-09 ENCOUNTER — Encounter: Payer: Self-pay | Admitting: Family Medicine

## 2019-06-09 ENCOUNTER — Ambulatory Visit (INDEPENDENT_AMBULATORY_CARE_PROVIDER_SITE_OTHER): Payer: Medicare Other | Admitting: Family Medicine

## 2019-06-09 VITALS — BP 120/82 | HR 60 | Ht 72.0 in | Wt 262.0 lb

## 2019-06-09 DIAGNOSIS — Z7901 Long term (current) use of anticoagulants: Secondary | ICD-10-CM

## 2019-06-09 DIAGNOSIS — E1121 Type 2 diabetes mellitus with diabetic nephropathy: Secondary | ICD-10-CM | POA: Diagnosis not present

## 2019-06-09 DIAGNOSIS — E790 Hyperuricemia without signs of inflammatory arthritis and tophaceous disease: Secondary | ICD-10-CM | POA: Diagnosis not present

## 2019-06-09 DIAGNOSIS — I10 Essential (primary) hypertension: Secondary | ICD-10-CM

## 2019-06-09 DIAGNOSIS — E782 Mixed hyperlipidemia: Secondary | ICD-10-CM

## 2019-06-09 DIAGNOSIS — Z23 Encounter for immunization: Secondary | ICD-10-CM

## 2019-06-09 DIAGNOSIS — K219 Gastro-esophageal reflux disease without esophagitis: Secondary | ICD-10-CM | POA: Diagnosis not present

## 2019-06-09 DIAGNOSIS — R69 Illness, unspecified: Secondary | ICD-10-CM | POA: Diagnosis not present

## 2019-06-09 DIAGNOSIS — Z1211 Encounter for screening for malignant neoplasm of colon: Secondary | ICD-10-CM | POA: Diagnosis not present

## 2019-06-09 LAB — HEMOCCULT GUIAC POC 1CARD (OFFICE): Fecal Occult Blood, POC: NEGATIVE

## 2019-06-09 MED ORDER — PANTOPRAZOLE SODIUM 40 MG PO TBEC: 40.0000 mg | DELAYED_RELEASE_TABLET | Freq: Every day | ORAL | 1 refills | Status: DC

## 2019-06-09 MED ORDER — METOPROLOL SUCCINATE ER 100 MG PO TB24: 100.0000 mg | ORAL_TABLET | Freq: Every day | ORAL | 1 refills | Status: DC

## 2019-06-09 MED ORDER — LOVASTATIN 20 MG PO TABS: 20.0000 mg | ORAL_TABLET | Freq: Every day | ORAL | 1 refills | Status: DC

## 2019-06-09 MED ORDER — LOSARTAN POTASSIUM 100 MG PO TABS: 100.0000 mg | ORAL_TABLET | Freq: Every day | ORAL | 1 refills | Status: DC

## 2019-06-09 MED ORDER — HYDROCHLOROTHIAZIDE 25 MG PO TABS: 25.0000 mg | ORAL_TABLET | Freq: Every day | ORAL | 1 refills | Status: DC

## 2019-06-09 MED ORDER — ALLOPURINOL 100 MG PO TABS: 100.0000 mg | ORAL_TABLET | Freq: Every day | ORAL | 1 refills | Status: DC

## 2019-06-09 MED ORDER — AMLODIPINE BESYLATE 10 MG PO TABS: 10.0000 mg | ORAL_TABLET | Freq: Every day | ORAL | 1 refills | Status: DC

## 2019-06-09 MED ORDER — VICTOZA 18 MG/3ML ~~LOC~~ SOPN: PEN_INJECTOR | SUBCUTANEOUS | 1 refills | Status: DC

## 2019-06-09 NOTE — Progress Notes (Signed)
Date:  06/09/2019   Name:  Mark Bauer   DOB:  December 20, 1955   MRN:  [id number]   Chief Complaint: Diabetes, Gastroesophageal Reflux, Hypertension, Hyperlipidemia, Allergic Rhinitis , and Atrial Fibrillation  Diabetes He presents for his follow-up diabetic visit. He has type 2 diabetes mellitus. His disease course has been stable. There are no hypoglycemic associated symptoms. Pertinent negatives for hypoglycemia include no dizziness, headaches, nervousness/anxiousness or sweats. There are no diabetic associated symptoms. Pertinent negatives for diabetes include no blurred vision, no chest pain, no fatigue, no polydipsia, no weakness and no weight loss. There are no hypoglycemic complications. Symptoms are stable. There are no diabetic complications. There are no known risk factors for coronary artery disease. Current diabetic treatments: victoza. He is compliant with treatment all of the time. His weight is stable. Meal planning includes avoidance of concentrated sweets and carbohydrate counting. He participates in exercise daily. His breakfast blood glucose is taken between 8-9 am. His breakfast blood glucose range is generally 110-130 mg/dl.  Gastroesophageal Reflux He reports no abdominal pain, no belching, no chest pain, no choking, no coughing, no dysphagia, no early satiety, no globus sensation, no heartburn, no hoarse voice, no nausea, no sore throat, no stridor, no tooth decay, no water brash or no wheezing. This is a new problem. The symptoms are aggravated by certain foods. Pertinent negatives include no anemia, fatigue, melena, muscle weakness, orthopnea or weight loss. He has tried a PPI for the symptoms. The treatment provided moderate relief. Past procedures do not include an abdominal ultrasound, an EGD, esophageal pH monitoring, H. pylori antibody titer or a UGI.  Hypertension This is a chronic problem. The current episode started more than 1 year ago. The problem is controlled.  Pertinent negatives include no anxiety, blurred vision, chest pain, headaches, malaise/fatigue, neck pain, orthopnea, palpitations, peripheral edema, PND, shortness of breath or sweats. There are no associated agents to hypertension. Past treatments include beta blockers, calcium channel blockers, diuretics and angiotensin blockers.  Hyperlipidemia This is a chronic problem. The current episode started more than 1 year ago. The problem is controlled. Recent lipid tests were reviewed and are normal. Exacerbating diseases include obesity. Pertinent negatives include no chest pain, focal sensory loss, focal weakness, leg pain, myalgias or shortness of breath. Current antihyperlipidemic treatment includes statins. There are no compliance problems.  Risk factors for coronary artery disease include diabetes mellitus, dyslipidemia and hypertension.  Atrial Fibrillation Presents for follow-up visit. Symptoms include hypertension. Symptoms are negative for an AICD problem, bradycardia, chest pain, dizziness, hypotension, pacemaker problem, palpitations, shortness of breath, syncope, tachycardia and weakness. The symptoms have been stable. Past medical history includes atrial fibrillation and hyperlipidemia. There are no medication compliance problems.    Lab Results  Component Value Date   CREATININE 3.55 (H) 01/02/2019   BUN 44 (H) 01/02/2019   NA 142 01/02/2019   K 3.6 01/02/2019   CL 103 01/02/2019   CO2 19 (L) 01/02/2019   Lab Results  Component Value Date   CHOL 113 01/02/2019   HDL 38 (L) 01/02/2019   LDLCALC 58 01/02/2019   TRIG 83 01/02/2019   CHOLHDL 3.7 12/16/2016   Lab Results  Component Value Date   TSH 3.040 01/09/2019   Lab Results  Component Value Date   HGBA1C 6.4 (H) 05/02/2019     Review of Systems  Constitutional: Negative for chills, fatigue, fever, malaise/fatigue and weight loss.  HENT: Negative for drooling, ear discharge, ear pain, hoarse voice and sore  throat.     Eyes: Negative for blurred vision.  Respiratory: Negative for cough, choking, shortness of breath and wheezing.   Cardiovascular: Negative for chest pain, palpitations, orthopnea, leg swelling, syncope and PND.  Gastrointestinal: Negative for abdominal pain, blood in stool, constipation, diarrhea, dysphagia, heartburn, melena and nausea.  Endocrine: Negative for polydipsia.  Genitourinary: Negative for dysuria, frequency, hematuria and urgency.  Musculoskeletal: Negative for back pain, myalgias, muscle weakness and neck pain.  Skin: Negative for rash.  Allergic/Immunologic: Negative for environmental allergies.  Neurological: Negative for dizziness, focal weakness, weakness and headaches.  Hematological: Does not bruise/bleed easily.  Psychiatric/Behavioral: Negative for suicidal ideas. The patient is not nervous/anxious.     Patient Active Problem List   Diagnosis Date Noted   Polycystic kidney disease 50/03/3817   Metabolic acidosis 29/93/7169   CKD stage 4 due to type 2 diabetes mellitus ([place]) 01/25/2018   Type 2 diabetes mellitus with diabetic nephropathy, without long-term current use of insulin ([place]) 01/25/2018   Secondary hyperparathyroidism of renal origin ([place]) 08/19/2017   Chronic atrial fibrillation ([place]) 01/06/2017   Type 2 diabetes mellitus without complication, without long-term current use of insulin ([place]) 01/06/2017   Essential hypertension 01/06/2017   Hyperuricemia 01/06/2017   Mixed hyperlipidemia 01/06/2017   Gastroesophageal reflux disease 01/06/2017   Rotator cuff tendinitis, left 01/08/2016   Obesity 12/26/2012    Allergies  Allergen Reactions   Caffeine    Ciprofloxacin Other (See Comments)   Pollen Extract Other (See Comments)    Sneezing, watery eyes, etc.    Past Surgical History:  Procedure Laterality Date   COLONOSCOPY  2012   hemorrhoids    Social History   Tobacco Use   Smoking status: Never Smoker   Smokeless  tobacco: Never Used  Substance Use Topics   Alcohol use: No    Alcohol/week: 0.0 standard drinks   Drug use: No     Medication list has been reviewed and updated.  Current Meds  Medication Sig   allopurinol (ZYLOPRIM) 100 MG tablet Take 1 tablet (100 mg total) by mouth daily.   amLODipine (NORVASC) 10 MG tablet Take 1 tablet (10 mg total) by mouth daily.   Cholecalciferol (D3 SUPER STRENGTH) 2000 units CAPS Take 1 capsule by mouth as directed. M-Friday   Cyanocobalamin (VITAMIN B 12 PO) Take 1 tablet by mouth daily.   hydrochlorothiazide (HYDRODIURIL) 25 MG tablet Take 1 tablet (25 mg total) by mouth daily.   Insulin Pen Needle (NOVOFINE) 32G X 6 MM MISC INJECT SUBCUTANEOUSLY 1  NEEDLE DAILY AS DIRECTED/ E11.9   liraglutide (VICTOZA) 18 MG/3ML SOPN INJECT SUBCUTANEOUSLY 1.8MG  ONCE A DAY   loratadine (CLARITIN) 10 MG tablet Take 10 mg by mouth daily. otc   losartan (COZAAR) 100 MG tablet Take 1 tablet (100 mg total) by mouth daily.   lovastatin (MEVACOR) 20 MG tablet Take 1 tablet (20 mg total) by mouth daily.   metoprolol succinate (TOPROL-XL) 100 MG 24 hr tablet Take 1 tablet (100 mg total) by mouth daily. Take with or immediately following a meal.   pantoprazole (PROTONIX) 40 MG tablet Take 1 tablet (40 mg total) by mouth daily.   potassium chloride (K-DUR) 10 MEQ tablet    warfarin (COUMADIN) 2 MG tablet Take 1 tablet (2 mg total) by mouth daily.   warfarin (COUMADIN) 4 MG tablet Take 1 tablet by mouth daily.   warfarin (COUMADIN) 5 MG tablet Take 1 tablet (5 mg total) by mouth daily.    PHQ 2/9 Scores  01/09/2019 07/07/2018 07/26/2017 07/18/2015  PHQ - 2 Score 0 0 0 0  PHQ- 9 Score - 2 0 -    BP Readings from Last 3 Encounters:  06/09/19 120/82  03/01/19 119/69  01/09/19 116/74    Physical Exam Vitals signs and nursing note reviewed.  HENT:     Head: Normocephalic.     Right Ear: External ear normal.     Left Ear: External ear normal.     Nose: Nose  normal.  Eyes:     General: No scleral icterus.       Right eye: No discharge.        Left eye: No discharge.     Conjunctiva/sclera: Conjunctivae normal.     Pupils: Pupils are equal, round, and reactive to light.  Neck:     Musculoskeletal: Normal range of motion and neck supple.     Thyroid: No thyromegaly.     Vascular: No JVD.     Trachea: No tracheal deviation.  Cardiovascular:     Rate and Rhythm: Normal rate and regular rhythm.     Heart sounds: Normal heart sounds. No murmur. No friction rub. No gallop.   Pulmonary:     Effort: No respiratory distress.     Breath sounds: Normal breath sounds. No wheezing or rales.  Abdominal:     General: Bowel sounds are normal.     Palpations: Abdomen is soft. There is no mass.     Tenderness: There is no abdominal tenderness. There is no guarding or rebound.  Musculoskeletal: Normal range of motion.        General: No tenderness.  Lymphadenopathy:     Cervical: No cervical adenopathy.  Skin:    General: Skin is warm.     Findings: No rash.  Neurological:     Mental Status: He is alert and oriented to person, place, and time.     Cranial Nerves: No cranial nerve deficit.     Deep Tendon Reflexes: Reflexes are normal and symmetric.     Wt Readings from Last 3 Encounters:  06/09/19 262 lb (118.8 kg)  03/01/19 250 lb 1.6 oz (113.4 kg)  01/09/19 251 lb (113.9 kg)    BP 120/82    Pulse 60    Ht 6' (1.829 m)    Wt 262 lb (118.8 kg)    BMI 35.53 kg/m    Assessment and Plan: 1. Hyperuricemia Patient with history of hyper uric acid anemia.  This is controlled with allopurinol and patient has tolerated this well in the past will continue 100 mg daily. - allopurinol (ZYLOPRIM) 100 MG tablet; Take 1 tablet (100 mg total) by mouth daily.  Dispense: 90 tablet; Refill: 1  2. Essential hypertension Chronic.  Controlled.  Stable.  Will continue amlodipine 10 mg losartan 100 mg and metoprolol XL 100 mg all once a day.  Reviewed CMP per  cardiology for labs. - amLODipine (NORVASC) 10 MG tablet; Take 1 tablet (10 mg total) by mouth daily.  Dispense: 90 tablet; Refill: 1 - losartan (COZAAR) 100 MG tablet; Take 1 tablet (100 mg total) by mouth daily.  Dispense: 90 tablet; Refill: 1 - metoprolol succinate (TOPROL-XL) 100 MG 24 hr tablet; Take 1 tablet (100 mg total) by mouth daily. Take with or immediately following a meal.  Dispense: 90 tablet; Refill: 1  3. Type 2 diabetes mellitus with diabetic nephropathy, without long-term current use of insulin ([facility]) Chronic.  Controlled.  Stable.  Will continue Victoza 1.8 injection/day which  is tolerated well. - liraglutide (VICTOZA) 18 MG/3ML SOPN; INJECT SUBCUTANEOUSLY 1.8MG  ONCE A DAY  Dispense: 27 mL; Refill: 1  4. Mixed hyperlipidemia Chronic.  Controlled.  Stable.  Continue lovastatin 20 mg once a day.  Will check lipid panel. - Lipid Panel With LDL/HDL Ratio - lovastatin (MEVACOR) 20 MG tablet; Take 1 tablet (20 mg total) by mouth daily.  Dispense: 90 tablet; Refill: 1  5. Gastroesophageal reflux disease Chronic.  Controlled.  Continue pantoprazole 40 mg once a day. - pantoprazole (PROTONIX) 40 MG tablet; Take 1 tablet (40 mg total) by mouth daily.  Dispense: 90 tablet; Refill: 1  6. Anticoagulated Patient will continue Coumadin on current dosing schedule.  Will check PT/INR today and adjust accordingly. - INR/PT  7. Taking one medication for chronic disease Patient is on a statin and we will check hepatic panel for evaluation of hepatotoxicity. - Hepatic function panel  8. Need for pneumococcal vaccination Discussed and administered - Pneumococcal conjugate vaccine 13-valent  9. Colon cancer screening Colon cancer screening with Hemoccult was negative.  This was done on 06/09/2019 by Dr. Dionne Milo. - POCT Occult Blood Stool

## 2019-06-10 LAB — PROTIME-INR
INR: 2.1 — ABNORMAL HIGH (ref 0.9–1.2)
Prothrombin Time: 21.8 s — ABNORMAL HIGH (ref 9.1–12.0)

## 2019-06-10 LAB — LIPID PANEL WITH LDL/HDL RATIO
Cholesterol, Total: 125 mg/dL (ref 100–199)
HDL: 34 mg/dL — ABNORMAL LOW (ref >39)
LDL Chol Calc (NIH): 72 mg/dL (ref 0–99)
LDL/HDL Ratio: 2.1 ratio (ref 0.0–3.6)
Triglycerides: 104 mg/dL (ref 0–149)
VLDL Cholesterol Cal: 19 mg/dL (ref 5–40)

## 2019-06-10 LAB — HEPATIC FUNCTION PANEL
ALT: 34 IU/L (ref 0–44)
AST: 26 IU/L (ref 0–40)
Albumin: 4.5 g/dL (ref 3.8–4.8)
Alkaline Phosphatase: 75 IU/L (ref 39–117)
Bilirubin Total: 0.5 mg/dL (ref 0.0–1.2)
Bilirubin, Direct: 0.14 mg/dL (ref 0.00–0.40)
Total Protein: 7.1 g/dL (ref 6.0–8.5)

## 2019-07-06 ENCOUNTER — Other Ambulatory Visit: Payer: Self-pay | Admitting: Family Medicine

## 2019-07-07 LAB — PROTIME-INR
INR: 3.3 — ABNORMAL HIGH (ref 0.9–1.2)
Prothrombin Time: 33.8 s — ABNORMAL HIGH (ref 9.1–12.0)

## 2019-07-12 ENCOUNTER — Ambulatory Visit: Payer: Medicare Other | Admitting: Family Medicine

## 2019-07-19 ENCOUNTER — Other Ambulatory Visit: Payer: Self-pay | Admitting: Family Medicine

## 2019-07-19 DIAGNOSIS — I482 Chronic atrial fibrillation, unspecified: Secondary | ICD-10-CM

## 2019-07-19 DIAGNOSIS — E1121 Type 2 diabetes mellitus with diabetic nephropathy: Secondary | ICD-10-CM

## 2019-07-26 ENCOUNTER — Other Ambulatory Visit: Payer: Medicare Other

## 2019-07-26 DIAGNOSIS — Z7901 Long term (current) use of anticoagulants: Secondary | ICD-10-CM | POA: Diagnosis not present

## 2019-07-27 LAB — PROTIME-INR
INR: 1.9 — ABNORMAL HIGH (ref 0.9–1.2)
Prothrombin Time: 20.2 s — ABNORMAL HIGH (ref 9.1–12.0)

## 2019-08-14 DIAGNOSIS — E119 Type 2 diabetes mellitus without complications: Secondary | ICD-10-CM | POA: Diagnosis not present

## 2019-08-14 DIAGNOSIS — Q613 Polycystic kidney, unspecified: Secondary | ICD-10-CM | POA: Diagnosis not present

## 2019-08-14 DIAGNOSIS — I10 Essential (primary) hypertension: Secondary | ICD-10-CM | POA: Diagnosis not present

## 2019-08-14 DIAGNOSIS — E872 Acidosis: Secondary | ICD-10-CM | POA: Diagnosis not present

## 2019-08-14 DIAGNOSIS — N184 Chronic kidney disease, stage 4 (severe): Secondary | ICD-10-CM | POA: Diagnosis not present

## 2019-08-21 DIAGNOSIS — M1A9XX0 Chronic gout, unspecified, without tophus (tophi): Secondary | ICD-10-CM | POA: Diagnosis not present

## 2019-08-21 DIAGNOSIS — N2581 Secondary hyperparathyroidism of renal origin: Secondary | ICD-10-CM | POA: Diagnosis not present

## 2019-08-21 DIAGNOSIS — N185 Chronic kidney disease, stage 5: Secondary | ICD-10-CM | POA: Diagnosis not present

## 2019-08-21 DIAGNOSIS — I10 Essential (primary) hypertension: Secondary | ICD-10-CM | POA: Diagnosis not present

## 2019-08-21 DIAGNOSIS — Z6835 Body mass index (BMI) 35.0-35.9, adult: Secondary | ICD-10-CM | POA: Diagnosis not present

## 2019-08-21 DIAGNOSIS — Q613 Polycystic kidney, unspecified: Secondary | ICD-10-CM | POA: Diagnosis not present

## 2019-08-21 DIAGNOSIS — E876 Hypokalemia: Secondary | ICD-10-CM | POA: Diagnosis not present

## 2019-08-28 ENCOUNTER — Other Ambulatory Visit: Payer: Medicare Other

## 2019-08-28 ENCOUNTER — Other Ambulatory Visit: Payer: Self-pay

## 2019-08-28 DIAGNOSIS — Z7901 Long term (current) use of anticoagulants: Secondary | ICD-10-CM

## 2019-08-28 NOTE — Progress Notes (Signed)
INR entered

## 2019-08-29 DIAGNOSIS — H43393 Other vitreous opacities, bilateral: Secondary | ICD-10-CM | POA: Diagnosis not present

## 2019-08-29 DIAGNOSIS — E11319 Type 2 diabetes mellitus with unspecified diabetic retinopathy without macular edema: Secondary | ICD-10-CM | POA: Diagnosis not present

## 2019-08-29 LAB — PROTIME-INR
INR: 2 — ABNORMAL HIGH (ref 0.9–1.2)
Prothrombin Time: 20.4 s — ABNORMAL HIGH (ref 9.1–12.0)

## 2019-08-31 ENCOUNTER — Other Ambulatory Visit: Payer: Self-pay

## 2019-09-15 ENCOUNTER — Ambulatory Visit: Payer: Medicare Other | Attending: Internal Medicine

## 2019-09-15 DIAGNOSIS — Z23 Encounter for immunization: Secondary | ICD-10-CM | POA: Insufficient documentation

## 2019-09-15 NOTE — Progress Notes (Signed)
   Covid-19 Vaccination Clinic  Name:  Mark Bauer    MRN: [id number] DOB: January 23, 1956  09/15/2019  [name] was observed post Covid-19 immunization for 15 minutes without incidence. He was provided with Vaccine Information Sheet and instruction to access the V-Safe system.   [name] was instructed to call 911 with any severe reactions post vaccine: Marland Kitchen Difficulty breathing  . Swelling of your face and throat  . A fast heartbeat  . A bad rash all over your body  . Dizziness and weakness    Immunizations Administered    Name Date Dose VIS Date Route   Pfizer COVID-19 Vaccine 09/15/2019 10:52 AM 0.3 mL 06/30/2019 Intramuscular   Manufacturer: [place]   Lot: [id number]   [place]: [id number]

## 2019-09-26 ENCOUNTER — Other Ambulatory Visit: Payer: Medicare Other

## 2019-09-26 ENCOUNTER — Other Ambulatory Visit: Payer: Self-pay

## 2019-09-26 DIAGNOSIS — Z7901 Long term (current) use of anticoagulants: Secondary | ICD-10-CM | POA: Diagnosis not present

## 2019-09-26 DIAGNOSIS — M109 Gout, unspecified: Secondary | ICD-10-CM | POA: Diagnosis not present

## 2019-09-26 DIAGNOSIS — N185 Chronic kidney disease, stage 5: Secondary | ICD-10-CM | POA: Diagnosis not present

## 2019-09-26 NOTE — Progress Notes (Signed)
Printed INR sheet

## 2019-09-27 LAB — PROTIME-INR
INR: 1.8 — ABNORMAL HIGH (ref 0.9–1.2)
Prothrombin Time: 19.2 s — ABNORMAL HIGH (ref 9.1–12.0)

## 2019-09-27 LAB — BASIC METABOLIC PANEL
BUN: 54 — AB (ref 4–21)
Creatinine: 4.2 — AB (ref 0.6–1.3)
Glucose: 102

## 2019-10-10 ENCOUNTER — Other Ambulatory Visit: Payer: Self-pay

## 2019-10-10 ENCOUNTER — Other Ambulatory Visit: Payer: Medicare Other

## 2019-10-10 DIAGNOSIS — Z7901 Long term (current) use of anticoagulants: Secondary | ICD-10-CM

## 2019-10-10 NOTE — Progress Notes (Signed)
Printed INR

## 2019-10-11 ENCOUNTER — Ambulatory Visit: Payer: Medicare Other | Attending: Internal Medicine

## 2019-10-11 DIAGNOSIS — Z23 Encounter for immunization: Secondary | ICD-10-CM

## 2019-10-11 LAB — PROTIME-INR
INR: 2.2 — ABNORMAL HIGH (ref 0.9–1.2)
Prothrombin Time: 23 s — ABNORMAL HIGH (ref 9.1–12.0)

## 2019-10-11 NOTE — Progress Notes (Signed)
   Covid-19 Vaccination Clinic  Name:  ZAIRE LEVESQUE    MRN: [id number] DOB: October 26, 1955  10/11/2019  Mr. Deis was observed post Covid-19 immunization for 15 minutes without incident. He was provided with Vaccine Information Sheet and instruction to access the V-Safe system.   Mr. Conde was instructed to call 911 with any severe reactions post vaccine: Marland Kitchen Difficulty breathing  . Swelling of face and throat  . A fast heartbeat  . A bad rash all over body  . Dizziness and weakness   Immunizations Administered    Name Date Dose VIS Date Route   Pfizer COVID-19 Vaccine 10/11/2019  1:30 PM 0.3 mL 06/30/2019 Intramuscular   Manufacturer: [place]   Lot: [id number]   [place]: [id number]

## 2019-10-24 ENCOUNTER — Other Ambulatory Visit: Payer: Self-pay

## 2019-10-24 ENCOUNTER — Other Ambulatory Visit: Payer: Medicare Other

## 2019-10-24 DIAGNOSIS — Z7901 Long term (current) use of anticoagulants: Secondary | ICD-10-CM

## 2019-10-25 LAB — PROTIME-INR
INR: 1.8 — ABNORMAL HIGH (ref 0.9–1.2)
Prothrombin Time: 19.2 s — ABNORMAL HIGH (ref 9.1–12.0)

## 2019-11-06 ENCOUNTER — Other Ambulatory Visit: Payer: Self-pay | Admitting: Family Medicine

## 2019-11-06 DIAGNOSIS — E1121 Type 2 diabetes mellitus with diabetic nephropathy: Secondary | ICD-10-CM

## 2019-11-06 DIAGNOSIS — I482 Chronic atrial fibrillation, unspecified: Secondary | ICD-10-CM

## 2019-11-07 ENCOUNTER — Other Ambulatory Visit: Payer: Self-pay | Admitting: Family Medicine

## 2019-11-07 DIAGNOSIS — Z7901 Long term (current) use of anticoagulants: Secondary | ICD-10-CM | POA: Diagnosis not present

## 2019-11-07 DIAGNOSIS — I4891 Unspecified atrial fibrillation: Secondary | ICD-10-CM | POA: Diagnosis not present

## 2019-11-08 LAB — PROTIME-INR
INR: 2.1 — ABNORMAL HIGH (ref 0.9–1.2)
Prothrombin Time: 22.2 s — ABNORMAL HIGH (ref 9.1–12.0)

## 2019-11-29 DIAGNOSIS — N184 Chronic kidney disease, stage 4 (severe): Secondary | ICD-10-CM | POA: Diagnosis not present

## 2019-12-05 DIAGNOSIS — N2581 Secondary hyperparathyroidism of renal origin: Secondary | ICD-10-CM | POA: Diagnosis not present

## 2019-12-05 DIAGNOSIS — I10 Essential (primary) hypertension: Secondary | ICD-10-CM | POA: Diagnosis not present

## 2019-12-05 DIAGNOSIS — N185 Chronic kidney disease, stage 5: Secondary | ICD-10-CM | POA: Diagnosis not present

## 2019-12-05 DIAGNOSIS — M1A9XX0 Chronic gout, unspecified, without tophus (tophi): Secondary | ICD-10-CM | POA: Diagnosis not present

## 2019-12-05 DIAGNOSIS — Z6836 Body mass index (BMI) 36.0-36.9, adult: Secondary | ICD-10-CM | POA: Diagnosis not present

## 2019-12-07 ENCOUNTER — Encounter: Payer: Self-pay | Admitting: Family Medicine

## 2019-12-07 ENCOUNTER — Ambulatory Visit (INDEPENDENT_AMBULATORY_CARE_PROVIDER_SITE_OTHER): Payer: Medicare Other | Admitting: Family Medicine

## 2019-12-07 ENCOUNTER — Other Ambulatory Visit: Payer: Self-pay

## 2019-12-07 DIAGNOSIS — K219 Gastro-esophageal reflux disease without esophagitis: Secondary | ICD-10-CM

## 2019-12-07 DIAGNOSIS — I482 Chronic atrial fibrillation, unspecified: Secondary | ICD-10-CM

## 2019-12-07 DIAGNOSIS — E790 Hyperuricemia without signs of inflammatory arthritis and tophaceous disease: Secondary | ICD-10-CM | POA: Diagnosis not present

## 2019-12-07 DIAGNOSIS — E782 Mixed hyperlipidemia: Secondary | ICD-10-CM | POA: Diagnosis not present

## 2019-12-07 DIAGNOSIS — E1121 Type 2 diabetes mellitus with diabetic nephropathy: Secondary | ICD-10-CM | POA: Diagnosis not present

## 2019-12-07 DIAGNOSIS — I10 Essential (primary) hypertension: Secondary | ICD-10-CM

## 2019-12-07 MED ORDER — VICTOZA 18 MG/3ML ~~LOC~~ SOPN: PEN_INJECTOR | SUBCUTANEOUS | 1 refills | Status: DC

## 2019-12-07 MED ORDER — AMLODIPINE BESYLATE 10 MG PO TABS: 10.0000 mg | ORAL_TABLET | Freq: Every day | ORAL | 1 refills | Status: DC

## 2019-12-07 MED ORDER — WARFARIN SODIUM 2 MG PO TABS: 2.0000 mg | ORAL_TABLET | Freq: Every day | ORAL | 1 refills | Status: DC

## 2019-12-07 MED ORDER — NOVOFINE 32G X 6 MM MISC: 0 refills | Status: DC

## 2019-12-07 MED ORDER — LOSARTAN POTASSIUM 100 MG PO TABS: 100.0000 mg | ORAL_TABLET | Freq: Every day | ORAL | 1 refills | Status: DC

## 2019-12-07 MED ORDER — LOVASTATIN 20 MG PO TABS: 20.0000 mg | ORAL_TABLET | Freq: Every day | ORAL | 1 refills | Status: DC

## 2019-12-07 MED ORDER — METOPROLOL SUCCINATE ER 100 MG PO TB24: 100.0000 mg | ORAL_TABLET | Freq: Every day | ORAL | 1 refills | Status: DC

## 2019-12-07 MED ORDER — WARFARIN SODIUM 5 MG PO TABS: 5.0000 mg | ORAL_TABLET | Freq: Every day | ORAL | 1 refills | Status: DC

## 2019-12-07 MED ORDER — PANTOPRAZOLE SODIUM 40 MG PO TBEC: 40.0000 mg | DELAYED_RELEASE_TABLET | Freq: Every day | ORAL | 1 refills | Status: DC

## 2019-12-07 MED ORDER — ALLOPURINOL 100 MG PO TABS: 100.0000 mg | ORAL_TABLET | Freq: Every day | ORAL | 1 refills | Status: DC

## 2019-12-07 NOTE — Progress Notes (Signed)
Date:  12/07/2019   Name:  Mark Bauer   DOB:  [birth year]   MRN:  [id number]   Chief Complaint: Hypertension, Gout, Diabetes, Atrial Fibrillation, and Hyperlipidemia  Hypertension This is a chronic problem. The current episode started more than 1 year ago. The problem has been gradually improving since onset. The problem is controlled. Pertinent negatives include no anxiety, blurred vision, chest pain, headaches, malaise/fatigue, neck pain, orthopnea, palpitations, peripheral edema, PND, shortness of breath or sweats. There are no associated agents to hypertension. Risk factors for coronary artery disease include diabetes mellitus, dyslipidemia and obesity. Past treatments include diuretics, calcium channel blockers, angiotensin blockers and beta blockers. The current treatment provides moderate improvement. There are no compliance problems.  Hypertensive end-organ damage includes kidney disease. There is no history of angina, CAD/MI, CVA, heart failure, left ventricular hypertrophy, PVD or retinopathy. There is no history of chronic renal disease, a hypertension causing med or renovascular disease.  Diabetes He presents for his follow-up diabetic visit. He has type 2 diabetes mellitus. His disease course has been stable. There are no hypoglycemic associated symptoms. Pertinent negatives for hypoglycemia include no dizziness, headaches, nervousness/anxiousness or sweats. Associated symptoms include polydipsia. Pertinent negatives for diabetes include no blurred vision, no chest pain, no fatigue, no foot paresthesias, no foot ulcerations, no visual change, no weakness and no weight loss. There are no hypoglycemic complications. Symptoms are stable. Pertinent negatives for diabetic complications include no autonomic neuropathy, CVA, heart disease, impotence, nephropathy, peripheral neuropathy, PVD or retinopathy. Risk factors for coronary artery disease include dyslipidemia, hypertension and male sex.  Current diabetic treatments: victoza. He is compliant with treatment all of the time. His weight is stable. He is following a generally healthy diet. Meal planning includes carbohydrate counting and avoidance of concentrated sweets. He participates in exercise daily. His breakfast blood glucose is taken between 8-9 am. His breakfast blood glucose range is generally 110-130 mg/dl. An ACE inhibitor/angiotensin II receptor blocker is being taken. He does not see a podiatrist.Eye exam is current.  Atrial Fibrillation Presents for follow-up visit. Symptoms include hypertension. Symptoms are negative for bradycardia, chest pain, dizziness, hypotension, palpitations, shortness of breath and weakness. The symptoms have been stable. Past medical history includes atrial fibrillation and hyperlipidemia.  Hyperlipidemia This is a chronic problem. The current episode started more than 1 year ago. The problem is controlled. Recent lipid tests were reviewed and are normal. Exacerbating diseases include diabetes and obesity. He has no history of chronic renal disease, hypothyroidism, liver disease or nephrotic syndrome. Pertinent negatives include no chest pain, myalgias or shortness of breath. Current antihyperlipidemic treatment includes statins. The current treatment provides moderate improvement of lipids. There are no compliance problems.  Risk factors for coronary artery disease include dyslipidemia and hypertension.    Lab Results  Component Value Date   CREATININE 4.2 (A) 09/27/2019   BUN 54 (A) 09/27/2019   NA 142 01/02/2019   K 3.6 01/02/2019   CL 103 01/02/2019   CO2 19 (L) 01/02/2019   Lab Results  Component Value Date   CHOL 125 06/09/2019   HDL 34 (L) 06/09/2019   LDLCALC 72 06/09/2019   TRIG 104 06/09/2019   CHOLHDL 3.7 12/16/2016   Lab Results  Component Value Date   TSH 3.040 01/09/2019   Lab Results  Component Value Date   HGBA1C 6.4 (H) 05/02/2019   Lab Results  Component Value  Date   WBC 7.1 01/09/2019   HGB 13.1 01/09/2019   HCT  38.2 01/09/2019   MCV 98 (H) 01/09/2019   PLT 172 01/09/2019   Lab Results  Component Value Date   ALT 34 06/09/2019   AST 26 06/09/2019   ALKPHOS 75 06/09/2019   BILITOT 0.5 06/09/2019     Review of Systems  Constitutional: Negative for chills, fatigue, fever, malaise/fatigue and weight loss.  HENT: Negative for drooling, ear discharge, ear pain and sore throat.   Eyes: Negative for blurred vision.  Respiratory: Negative for cough, shortness of breath and wheezing.   Cardiovascular: Negative for chest pain, palpitations, orthopnea, leg swelling and PND.  Gastrointestinal: Negative for abdominal pain, blood in stool, constipation, diarrhea and nausea.  Endocrine: Positive for polydipsia.  Genitourinary: Negative for dysuria, frequency, hematuria, impotence and urgency.  Musculoskeletal: Negative for back pain, myalgias and neck pain.  Skin: Negative for rash.  Allergic/Immunologic: Negative for environmental allergies.  Neurological: Negative for dizziness, weakness and headaches.  Hematological: Does not bruise/bleed easily.  Psychiatric/Behavioral: Negative for suicidal ideas. The patient is not nervous/anxious.     Patient Active Problem List   Diagnosis Date Noted  . Polycystic kidney disease 03/01/2019  . Metabolic acidosis 77/41/2878  . CKD stage 4 due to type 2 diabetes mellitus ([place]) 01/25/2018  . Type 2 diabetes mellitus with diabetic nephropathy, without long-term current use of insulin ([place]) 01/25/2018  . Secondary hyperparathyroidism of renal origin ([place]) 08/19/2017  . Chronic atrial fibrillation ([place]) 01/06/2017  . Type 2 diabetes mellitus without complication, without long-term current use of insulin ([place]) 01/06/2017  . Essential hypertension 01/06/2017  . Hyperuricemia 01/06/2017  . Mixed hyperlipidemia 01/06/2017  . Gastroesophageal reflux disease 01/06/2017  . Rotator cuff tendinitis, left 01/08/2016    . Obesity 12/26/2012    Allergies  Allergen Reactions  . Caffeine   . Ciprofloxacin Other (See Comments)  . Pollen Extract Other (See Comments)    Sneezing, watery eyes, etc.    Past Surgical History:  Procedure Laterality Date  . COLONOSCOPY  2012   hemorrhoids    Social History   Tobacco Use  . Smoking status: Never Smoker  . Smokeless tobacco: Never Used  Substance Use Topics  . Alcohol use: No    Alcohol/week: 0.0 standard drinks  . Drug use: No     Medication list has been reviewed and updated.  Current Meds  Medication Sig  . allopurinol (ZYLOPRIM) 100 MG tablet Take 1 tablet (100 mg total) by mouth daily.  Marland Kitchen amLODipine (NORVASC) 10 MG tablet Take 1 tablet (10 mg total) by mouth daily.  . Cholecalciferol (D3 SUPER STRENGTH) 2000 units CAPS Take 1 capsule by mouth as directed. M-Friday  . Cyanocobalamin (VITAMIN B 12 PO) Take 1 tablet by mouth daily.  Marland Kitchen JANTOVEN 2 MG tablet TAKE 1 TABLET DAILY  . JANTOVEN 5 MG tablet TAKE 1 TABLET DAILY  . liraglutide (VICTOZA) 18 MG/3ML SOPN INJECT SUBCUTANEOUSLY 1.8MG  ONCE A DAY  . loratadine (CLARITIN) 10 MG tablet Take 10 mg by mouth daily. otc  . losartan (COZAAR) 100 MG tablet Take 1 tablet (100 mg total) by mouth daily.  Marland Kitchen lovastatin (MEVACOR) 20 MG tablet Take 1 tablet (20 mg total) by mouth daily.  . metoprolol succinate (TOPROL-XL) 100 MG 24 hr tablet Take 1 tablet (100 mg total) by mouth daily. Take with or immediately following a meal.  . NOVOFINE 32G X 6 MM MISC USE AND DISCARD 1 PEN      NEEDLE SUBCUTANEOUSLY DAILYAS DIRECTED  . pantoprazole (PROTONIX) 40 MG tablet Take 1  tablet (40 mg total) by mouth daily.  Marland Kitchen warfarin (COUMADIN) 4 MG tablet Take 1 tablet by mouth daily.    PHQ 2/9 Scores 01/09/2019 07/07/2018 07/26/2017 07/18/2015  PHQ - 2 Score 0 0 0 0  PHQ- 9 Score - 2 0 -    BP Readings from Last 3 Encounters:  12/07/19 (!) 130/98  06/09/19 120/82  03/01/19 119/69    Physical Exam Vitals and nursing  note reviewed.  HENT:     Head: Normocephalic.     Right Ear: Tympanic membrane, ear canal and external ear normal.     Left Ear: Tympanic membrane, ear canal and external ear normal.     Nose: Nose normal. No congestion or rhinorrhea.     Mouth/Throat:     Mouth: Mucous membranes are moist.  Eyes:     General: No scleral icterus.       Right eye: No discharge.        Left eye: No discharge.     Conjunctiva/sclera: Conjunctivae normal.     Pupils: Pupils are equal, round, and reactive to light.  Neck:     Thyroid: No thyromegaly.     Vascular: No JVD.     Trachea: No tracheal deviation.  Cardiovascular:     Rate and Rhythm: Normal rate and regular rhythm.     Pulses: Normal pulses.     Heart sounds: Normal heart sounds, S1 normal and S2 normal. No murmur. No systolic murmur. No diastolic murmur. No friction rub. No gallop. No S3 or S4 sounds.   Pulmonary:     Effort: No respiratory distress.     Breath sounds: Normal breath sounds. No wheezing, rhonchi or rales.  Abdominal:     General: Bowel sounds are normal.     Palpations: Abdomen is soft. There is no mass.     Tenderness: There is no abdominal tenderness. There is no guarding or rebound.  Musculoskeletal:        General: No tenderness. Normal range of motion.     Cervical back: Normal range of motion and neck supple.     Right lower leg: 1+ Pitting Edema present.     Left lower leg: 1+ Pitting Edema present.  Lymphadenopathy:     Cervical: No cervical adenopathy.  Skin:    General: Skin is warm.     Findings: No rash.  Neurological:     Mental Status: He is alert and oriented to person, place, and time.     Cranial Nerves: No cranial nerve deficit.     Deep Tendon Reflexes: Reflexes are normal and symmetric.     Wt Readings from Last 3 Encounters:  12/07/19 267 lb (121.1 kg)  06/09/19 262 lb (118.8 kg)  03/01/19 250 lb 1.6 oz (113.4 kg)    BP (!) 130/98   Pulse 68   Ht 6' (1.829 m)   Wt 267 lb (121.1 kg)    BMI 36.21 kg/m   Assessment and Plan:  1. Hyperuricemia .  Controlled.  Stable.  Continue allopurinol 100 mg daily. - allopurinol (ZYLOPRIM) 100 MG tablet; Take 1 tablet (100 mg total) by mouth daily.  Dispense: 90 tablet; Refill: 1  2. Essential hypertension .  Controlled.  Stable.  Continue amlodipine 10 mg once a day losartan 100 mg once a day and metoprolol XL 100 mg once a day. - amLODipine (NORVASC) 10 MG tablet; Take 1 tablet (10 mg total) by mouth daily.  Dispense: 90 tablet; Refill: 1 - losartan (  COZAAR) 100 MG tablet; Take 1 tablet (100 mg total) by mouth daily.  Dispense: 90 tablet; Refill: 1 - metoprolol succinate (TOPROL-XL) 100 MG 24 hr tablet; Take 1 tablet (100 mg total) by mouth daily. Take with or immediately following a meal.  Dispense: 90 tablet; Refill: 1  3. Chronic atrial fibrillation ([place]) Followed by cardiology patient will continue warfarin on current levels. - warfarin (JANTOVEN) 2 MG tablet; Take 1 tablet (2 mg total) by mouth daily.  Dispense: 90 tablet; Refill: 1 - warfarin (JANTOVEN) 5 MG tablet; Take 1 tablet (5 mg total) by mouth daily.  Dispense: 90 tablet; Refill: 1 - INR/PT  4. Type 2 diabetes mellitus with diabetic nephropathy, without long-term current use of insulin ([facility]) Chronic.  Controlled.  Stable.  Will continue on Victoza 1.8 mg daily we will check A1c and microalbuminuria for stabilization of diabetes. - liraglutide (VICTOZA) 18 MG/3ML SOPN; INJECT SUBCUTANEOUSLY 1.8MG  ONCE A DAY  Dispense: 27 mL; Refill: 1 - Insulin Pen Needle (NOVOFINE) 32G X 6 MM MISC; 1 needle daily-  Dispense: 100 each; Refill: 0 - HgB A1c - Microalbumin, urine  5. Mixed hyperlipidemia Chronic.  Controlled.  Stable.  Continue lovastatin 20 mg once a day.  Will check lipid panel for current lipid status. - lovastatin (MEVACOR) 20 MG tablet; Take 1 tablet (20 mg total) by mouth daily.  Dispense: 90 tablet; Refill: 1 - Lipid Panel With LDL/HDL Ratio  6. Gastroesophageal  reflux disease, unspecified whether esophagitis present Chronic.  Controlled.  Stable.  Continue pantoprazole 40 mg once a day. - pantoprazole (PROTONIX) 40 MG tablet; Take 1 tablet (40 mg total) by mouth daily.  Dispense: 90 tablet; Refill: 1

## 2019-12-08 LAB — PROTIME-INR
INR: 2.1 — ABNORMAL HIGH (ref 0.9–1.2)
Prothrombin Time: 21.7 s — ABNORMAL HIGH (ref 9.1–12.0)

## 2019-12-08 LAB — LIPID PANEL WITH LDL/HDL RATIO
Cholesterol, Total: 106 mg/dL (ref 100–199)
HDL: 35 mg/dL — ABNORMAL LOW (ref >39)
LDL Chol Calc (NIH): 57 mg/dL (ref 0–99)
LDL/HDL Ratio: 1.6 ratio (ref 0.0–3.6)
Triglycerides: 63 mg/dL (ref 0–149)
VLDL Cholesterol Cal: 14 mg/dL (ref 5–40)

## 2019-12-08 LAB — HEMOGLOBIN A1C
Est. average glucose Bld gHb Est-mCnc: 126 mg/dL
Hgb A1c MFr Bld: 6 % — ABNORMAL HIGH (ref 4.8–5.6)

## 2019-12-08 LAB — MICROALBUMIN, URINE: Microalbumin, Urine: 812.7 ug/mL

## 2019-12-19 DIAGNOSIS — N184 Chronic kidney disease, stage 4 (severe): Secondary | ICD-10-CM | POA: Diagnosis not present

## 2020-01-02 ENCOUNTER — Other Ambulatory Visit: Payer: Medicare Other

## 2020-01-02 DIAGNOSIS — Z7901 Long term (current) use of anticoagulants: Secondary | ICD-10-CM

## 2020-01-03 LAB — PROTIME-INR
INR: 2.2 — ABNORMAL HIGH (ref 0.9–1.2)
Prothrombin Time: 23.1 s — ABNORMAL HIGH (ref 9.1–12.0)

## 2020-01-10 ENCOUNTER — Ambulatory Visit (INDEPENDENT_AMBULATORY_CARE_PROVIDER_SITE_OTHER): Payer: Medicare Other

## 2020-01-10 VITALS — BP 134/84

## 2020-01-10 DIAGNOSIS — Q446 Cystic disease of liver: Secondary | ICD-10-CM | POA: Insufficient documentation

## 2020-01-10 DIAGNOSIS — Z0000 Encounter for general adult medical examination without abnormal findings: Secondary | ICD-10-CM

## 2020-01-10 NOTE — Patient Instructions (Signed)
Mark Bauer , Thank you for taking time to come for your Medicare Wellness Visit. I appreciate your ongoing commitment to your health goals. Please review the following plan we discussed and let me know if I can assist you in the future.   Screening recommendations/referrals: Colonoscopy: done 2012 Recommended yearly ophthalmology/optometry visit for glaucoma screening and checkup Recommended yearly dental visit for hygiene and checkup  Vaccinations: Influenza vaccine: done 05/04/19 Pneumococcal vaccine: done 06/09/19 Tdap vaccine: done 07/26/17 Shingles vaccine: Shingrix discussed. Please contact your pharmacy for coverage information.  Covid-19: done 09/15/19 & 10/11/19  Advanced directives: Advance directive discussed with you today. I have provided a copy for you to complete at home and have notarized. Once this is complete please bring a copy in to our office so we can scan it into your chart.  Conditions/risks identified: Recommend increasing physical activity   Next appointment: Follow up in one year for your annual wellness visit.   Preventive Care 14 Years and Older, Male Preventive care refers to lifestyle choices and visits with your health care provider that can promote health and wellness. What does preventive care include?  A yearly physical exam. This is also called an annual well check.  Dental exams once or twice a year.  Routine eye exams. Ask your health care provider how often you should have your eyes checked.  Personal lifestyle choices, including:  Daily care of your teeth and gums.  Regular physical activity.  Eating a healthy diet.  Avoiding tobacco and drug use.  Limiting alcohol use.  Practicing safe sex.  Taking low doses of aspirin every day.  Taking vitamin and mineral supplements as recommended by your health care provider. What happens during an annual well check? The services and screenings done by your health care provider during your  annual well check will depend on your age, overall health, lifestyle risk factors, and family history of disease. Counseling  Your health care provider may ask you questions about your:  Alcohol use.  Tobacco use.  Drug use.  Emotional well-being.  Home and relationship well-being.  Sexual activity.  Eating habits.  History of falls.  Memory and ability to understand (cognition).  Work and work Statistician. Screening  You may have the following tests or measurements:  Height, weight, and BMI.  Blood pressure.  Lipid and cholesterol levels. These may be checked every 5 years, or more frequently if you are over 34 years old.  Skin check.  Lung cancer screening. You may have this screening every year starting at age 72 if you have a 30-pack-year history of smoking and currently smoke or have quit within the past 15 years.  Fecal occult blood test (FOBT) of the stool. You may have this test every year starting at age 37.  Flexible sigmoidoscopy or colonoscopy. You may have a sigmoidoscopy every 5 years or a colonoscopy every 10 years starting at age 78.  Prostate cancer screening. Recommendations will vary depending on your family history and other risks.  Hepatitis C blood test.  Hepatitis B blood test.  Sexually transmitted disease (STD) testing.  Diabetes screening. This is done by checking your blood sugar (glucose) after you have not eaten for a while (fasting). You may have this done every 1-3 years.  Abdominal aortic aneurysm (AAA) screening. You may need this if you are a current or former smoker.  Osteoporosis. You may be screened starting at age 32 if you are at high risk. Talk with your health care provider about  your test results, treatment options, and if necessary, the need for more tests. Vaccines  Your health care provider may recommend certain vaccines, such as:  Influenza vaccine. This is recommended every year.  Tetanus, diphtheria, and  acellular pertussis (Tdap, Td) vaccine. You may need a Td booster every 10 years.  Zoster vaccine. You may need this after age 40.  Pneumococcal 13-valent conjugate (PCV13) vaccine. One dose is recommended after age 27.  Pneumococcal polysaccharide (PPSV23) vaccine. One dose is recommended after age 74. Talk to your health care provider about which screenings and vaccines you need and how often you need them. This information is not intended to replace advice given to you by your health care provider. Make sure you discuss any questions you have with your health care provider. Document Released: 08/02/2015 Document Revised: 03/25/2016 Document Reviewed: 05/07/2015 Elsevier Interactive Patient Education  2017 [place] Prevention in the Home Falls can cause injuries. They can happen to people of all ages. There are many things you can do to make your home safe and to help prevent falls. What can I do on the outside of my home?  Regularly fix the edges of walkways and driveways and fix any cracks.  Remove anything that might make you trip as you walk through a door, such as a raised step or threshold.  Trim any bushes or trees on the path to your home.  Use bright outdoor lighting.  Clear any walking paths of anything that might make someone trip, such as rocks or tools.  Regularly check to see if handrails are loose or broken. Make sure that both sides of any steps have handrails.  Any raised decks and porches should have guardrails on the edges.  Have any leaves, snow, or ice cleared regularly.  Use sand or salt on walking paths during winter.  Clean up any spills in your garage right away. This includes oil or grease spills. What can I do in the bathroom?  Use night lights.  Install grab bars by the toilet and in the tub and shower. Do not use towel bars as grab bars.  Use non-skid mats or decals in the tub or shower.  If you need to sit down in the shower, use  a plastic, non-slip stool.  Keep the floor dry. Clean up any water that spills on the floor as soon as it happens.  Remove soap buildup in the tub or shower regularly.  Attach bath mats securely with double-sided non-slip rug tape.  Do not have throw rugs and other things on the floor that can make you trip. What can I do in the bedroom?  Use night lights.  Make sure that you have a light by your bed that is easy to reach.  Do not use any sheets or blankets that are too big for your bed. They should not hang down onto the floor.  Have a firm chair that has side arms. You can use this for support while you get dressed.  Do not have throw rugs and other things on the floor that can make you trip. What can I do in the kitchen?  Clean up any spills right away.  Avoid walking on wet floors.  Keep items that you use a lot in easy-to-reach places.  If you need to reach something above you, use a strong step stool that has a grab bar.  Keep electrical cords out of the way.  Do not use floor polish or wax  that makes floors slippery. If you must use wax, use non-skid floor wax.  Do not have throw rugs and other things on the floor that can make you trip. What can I do with my stairs?  Do not leave any items on the stairs.  Make sure that there are handrails on both sides of the stairs and use them. Fix handrails that are broken or loose. Make sure that handrails are as long as the stairways.  Check any carpeting to make sure that it is firmly attached to the stairs. Fix any carpet that is loose or worn.  Avoid having throw rugs at the top or bottom of the stairs. If you do have throw rugs, attach them to the floor with carpet tape.  Make sure that you have a light switch at the top of the stairs and the bottom of the stairs. If you do not have them, ask someone to add them for you. What else can I do to help prevent falls?  Wear shoes that:  Do not have high heels.  Have  rubber bottoms.  Are comfortable and fit you well.  Are closed at the toe. Do not wear sandals.  If you use a stepladder:  Make sure that it is fully opened. Do not climb a closed stepladder.  Make sure that both sides of the stepladder are locked into place.  Ask someone to hold it for you, if possible.  Clearly mark and make sure that you can see:  Any grab bars or handrails.  First and last steps.  Where the edge of each step is.  Use tools that help you move around (mobility aids) if they are needed. These include:  Canes.  Walkers.  Scooters.  Crutches.  Turn on the lights when you go into a dark area. Replace any light bulbs as soon as they burn out.  Set up your furniture so you have a clear path. Avoid moving your furniture around.  If any of your floors are uneven, fix them.  If there are any pets around you, be aware of where they are.  Review your medicines with your doctor. Some medicines can make you feel dizzy. This can increase your chance of falling. Ask your doctor what other things that you can do to help prevent falls. This information is not intended to replace advice given to you by your health care provider. Make sure you discuss any questions you have with your health care provider. Document Released: 05/02/2009 Document Revised: 12/12/2015 Document Reviewed: 08/10/2014 Elsevier Interactive Patient Education  2017 [employer].

## 2020-01-10 NOTE — Progress Notes (Signed)
Subjective:   Mark Bauer is a 64 y.o. male who presents for an Initial Medicare Annual Wellness Visit.  Virtual Visit via Telephone Note  I connected with  Mark Bauer on 01/10/20 at  2:00 PM EDT by telephone and verified that I am speaking with the correct person using two identifiers.  Medicare Annual Wellness visit completed telephonically due to Covid-19 pandemic.   Location: Patient: home Provider: office   I discussed the limitations, risks, security and privacy concerns of performing an evaluation and management service by telephone and the availability of in person appointments. The patient expressed understanding and agreed to proceed.  Unable to perform video visit due to video visit attempted and failed and/or patient does not have video capability.   Some vital signs may be absent or patient reported.   [name], LPN    Review of Systems     Cardiac Risk Factors include: advanced age (>69men, >36 women);diabetes mellitus;dyslipidemia;male gender;hypertension;obesity (BMI >30kg/m2)     Objective:    Today's Vitals   01/10/20 1404  BP: 134/84   There is no height or weight on file to calculate BMI.  Advanced Directives 01/10/2020 07/18/2015  Does Patient Have a Medical Advance Directive? No No  Would patient like information on creating a medical advance directive? Yes (MAU/Ambulatory/Procedural Areas - Information given) -    Current Medications (verified) Outpatient Encounter Medications as of 01/10/2020  Medication Sig  . allopurinol (ZYLOPRIM) 100 MG tablet Take 1 tablet (100 mg total) by mouth daily.  Marland Kitchen amLODipine (NORVASC) 10 MG tablet Take 1 tablet (10 mg total) by mouth daily.  . Cholecalciferol (D3 SUPER STRENGTH) 2000 units CAPS Take 1 capsule by mouth as directed. M-Friday  . Cyanocobalamin (VITAMIN B 12 PO) Take 1 tablet by mouth daily.  . Insulin Pen Needle (NOVOFINE) 32G X 6 MM MISC 1 needle daily-  . liraglutide (VICTOZA) 18  MG/3ML SOPN INJECT SUBCUTANEOUSLY 1.8MG  ONCE A DAY  . loratadine (CLARITIN) 10 MG tablet Take 10 mg by mouth daily. otc  . lovastatin (MEVACOR) 20 MG tablet Take 1 tablet (20 mg total) by mouth daily.  . metoprolol succinate (TOPROL-XL) 100 MG 24 hr tablet Take 1 tablet (100 mg total) by mouth daily. Take with or immediately following a meal.  . pantoprazole (PROTONIX) 40 MG tablet Take 1 tablet (40 mg total) by mouth daily.  Marland Kitchen spironolactone (ALDACTONE) 25 MG tablet Take 25 mg by mouth daily.  Marland Kitchen torsemide (DEMADEX) 10 MG tablet Take 10 mg by mouth daily.  Marland Kitchen warfarin (COUMADIN) 4 MG tablet Take 1 tablet by mouth daily.  Marland Kitchen warfarin (JANTOVEN) 5 MG tablet Take 1 tablet (5 mg total) by mouth daily.  . [DISCONTINUED] losartan (COZAAR) 100 MG tablet Take 1 tablet (100 mg total) by mouth daily.  . [DISCONTINUED] warfarin (JANTOVEN) 2 MG tablet Take 1 tablet (2 mg total) by mouth daily.   No facility-administered encounter medications on file as of 01/10/2020.    Allergies (verified) Caffeine, Ciprofloxacin, and Pollen extract   History: Past Medical History:  Diagnosis Date  . Allergy   . Diabetes mellitus without complication ([place])   . GERD (gastroesophageal reflux disease)   . Gout   . Hypertension   . Hypokalemia   . Mixed hyperlipidemia   . Polycystic kidney   . Vitamin D deficiency    Past Surgical History:  Procedure Laterality Date  . COLONOSCOPY  2012   hemorrhoids   Family History  Problem Relation Age  of Onset  . Diabetes Mother   . Heart disease Father    Social History   Socioeconomic History  . Marital status: Single    Spouse name: Not on file  . Number of children: 0  . Years of education: Not on file  . Highest education level: Associate degree: occupational, Hotel manager, or vocational program  Occupational History  . Occupation: retired  Tobacco Use  . Smoking status: Never Smoker  . Smokeless tobacco: Never Used  Substance and Sexual Activity  . Alcohol  use: No    Alcohol/week: 0.0 standard drinks  . Drug use: No  . Sexual activity: Never  Other Topics Concern  . Not on file  Social History Narrative   Never married, no children, lives alone   Social Determinants of Health   Financial Resource Strain: Low Risk   . Difficulty of Paying Living Expenses: Not hard at all  Food Insecurity: No Food Insecurity  . Worried About Charity fundraiser in the Last Year: Never true  . Ran Out of Food in the Last Year: Never true  Transportation Needs: No Transportation Needs  . Lack of Transportation (Medical): No  . Lack of Transportation (Non-Medical): No  Physical Activity: Inactive  . Days of Exercise per Week: 0 days  . Minutes of Exercise per Session: 0 min  Stress: No Stress Concern Present  . Feeling of Stress : Not at all  Social Connections: Socially Isolated  . Frequency of Communication with Friends and Family: More than three times a week  . Frequency of Social Gatherings with Friends and Family: Never  . Attends Religious Services: Never  . Active Member of Clubs or Organizations: No  . Attends Archivist Meetings: Never  . Marital Status: Never married    Tobacco Counseling Counseling given: Not Answered   Clinical Intake:  Pre-visit preparation completed: Yes  Pain : No/denies pain     Nutritional Risks: None Diabetes: Yes CBG done?: No Did pt. bring in CBG monitor from home?: No  How often do you need to have someone help you when you read instructions, pamphlets, or other written materials from your doctor or pharmacy?: 1 - Never  Nutrition Risk Assessment:  Has the patient had any N/V/D within the last 2 months?  No  Does the patient have any non-healing wounds?  No  Has the patient had any unintentional weight loss or weight gain?  No   Diabetes:  Is the patient diabetic?  Yes  If diabetic, was a CBG obtained today?  No  Did the patient bring in their glucometer from home?  No  How often  do you monitor your CBG's? 3-4 times per week.   Financial Strains and Diabetes Management:  Are you having any financial strains with the device, your supplies or your medication? No .  Does the patient want to be seen by Chronic Care Management for management of their diabetes?  No  Would the patient like to be referred to a Nutritionist or for Diabetic Management?  No   Diabetic Exams:  Diabetic Eye Exam: Completed 08/29/19.   Diabetic Foot Exam: Completed 01/09/19. Pt has been advised about the importance in completing this exam. Interpreter Needed?: No  Information entered by :: [name] LPN   Activities of Daily Living In your present state of health, do you have any difficulty performing the following activities: 01/10/2020  Hearing? N  Comment declines hearing aids  Vision? N  Difficulty concentrating or  making decisions? N  Walking or climbing stairs? N  Dressing or bathing? N  Doing errands, shopping? N  Preparing Food and eating ? N  Using the Toilet? N  In the past six months, have you accidently leaked urine? N  Do you have problems with loss of bowel control? N  Managing your Medications? N  Managing your Finances? N  Housekeeping or managing your Housekeeping? N  Some recent data might be hidden    Patient Care Team: Juline Patch, MD as PCP - General (Family Medicine)  Indicate any recent Medical Services you may have received from other than [facility] providers in the past year (date may be approximate).     Assessment:   This is a routine wellness examination for Oluwadamilola.  Hearing/Vision screen  Hearing Screening   125Hz  250Hz  500Hz  1000Hz  2000Hz  3000Hz  4000Hz  6000Hz  8000Hz   Right ear:           Left ear:           Comments: Pt denies hearing difficulty  Vision Screening Comments: Annual vision screenings done by Dr. Marvel Plan  Dietary issues and exercise activities discussed: Current Exercise Habits: The patient does not participate in regular  exercise at present, Exercise limited by: None identified  Goals   None    Depression Screen PHQ 2/9 Scores 01/10/2020 01/09/2019 07/07/2018 07/26/2017 07/18/2015  PHQ - 2 Score 0 0 0 0 0  PHQ- 9 Score - - 2 0 -    Fall Risk Fall Risk  01/10/2020 01/09/2019 07/07/2018 07/26/2017 07/18/2015  Falls in the past year? 0 0 0 No No  Number falls in past yr: 0 0 - - -  Injury with Fall? 0 0 - - -  Risk for fall due to : No Fall Risks - - - -  Follow up Falls prevention discussed Falls evaluation completed Falls evaluation completed - -    Any stairs in or around the home? Yes  If so, are there any without handrails? Yes  Home free of loose throw rugs in walkways, pet beds, electrical cords, etc? Yes  Adequate lighting in your home to reduce risk of falls? Yes   ASSISTIVE DEVICES UTILIZED TO PREVENT FALLS:  Life alert? No  Use of a cane, walker or w/c? No  Grab bars in the bathroom? Yes  Shower chair or bench in shower? Yes  Elevated toilet seat or a handicapped toilet? No   TIMED UP AND GO:  Was the test performed? No . Telephonic visit.   Cognitive Function: pt declined 6CIT for 2021 AWV; pt states no memory issues        Immunizations Immunization History  Administered Date(s) Administered  . Influenza, High Dose Seasonal PF 05/04/2019  . Influenza,inj,Quad PF,6+ Mos 04/23/2017, 05/02/2018  . Influenza-Unspecified 05/04/2019  . PFIZER SARS-COV-2 Vaccination 09/15/2019, 10/11/2019  . Pneumococcal Conjugate-13 06/09/2019  . Pneumococcal Polysaccharide-23 12/27/2015  . Tdap 07/26/2017    TDAP status: Up to date   Flu Vaccine status: Up to date   Pneumococcal vaccine status: Up to date   Covid-19 vaccine status: Completed vaccines  Qualifies for Shingles Vaccine? Yes   Zostavax completed No   Shingrix Completed?: No.    Education has been provided regarding the importance of this vaccine. Patient has been advised to call insurance company to determine out of pocket  expense if they have not yet received this vaccine. Advised may also receive vaccine at local pharmacy or Health Dept. Verbalized acceptance and understanding.  Screening Tests Health Maintenance  Topic Date Due  . FOOT EXAM  01/09/2020  . INFLUENZA VACCINE  02/18/2020  . HEMOGLOBIN A1C  06/08/2020  . COLONOSCOPY  07/20/2020  . OPHTHALMOLOGY EXAM  08/28/2020  . URINE MICROALBUMIN  12/06/2020  . PNA vac Low Risk Adult (2 of 2 - PPSV23) 12/26/2020  . TETANUS/TDAP  07/27/2027  . COVID-19 Vaccine  Completed  . Hepatitis C Screening  Completed    Health Maintenance  Health Maintenance Due  Topic Date Due  . FOOT EXAM  01/09/2020    Colorectal cancer screening: Completed 2012. Repeat every 10 years  Lung Cancer Screening: (Low Dose CT Chest recommended if Age 72-80 years, 30 pack-year currently smoking OR have quit w/in 15years.) does not qualify.    Additional Screening:  Hepatitis C Screening: does qualify; Completed 07/26/17  Vision Screening: Recommended annual ophthalmology exams for early detection of glaucoma and other disorders of the eye. Is the patient up to date with their annual eye exam?  Yes  Who is the provider or what is the name of the office in which the patient attends annual eye exams? Dr. Marvel Plan  Dental Screening: Recommended annual dental exams for proper oral hygiene  Community Resource Referral / Chronic Care Management: CRR required this visit?  No   CCM required this visit?  No      Plan:     I have personally reviewed and noted the following in the patient's chart:   . Medical and social history . Use of alcohol, tobacco or illicit drugs  . Current medications and supplements . Functional ability and status . Nutritional status . Physical activity . Advanced directives . List of other physicians . Hospitalizations, surgeries, and ER visits in previous 12 months . Vitals . Screenings to include cognitive, depression, and  falls . Referrals and appointments  In addition, I have reviewed and discussed with patient certain preventive protocols, quality metrics, and best practice recommendations. A written personalized care plan for preventive services as well as general preventive health recommendations were provided to patient.     [name], LPN   [id number]   Nurse Notes: none

## 2020-01-30 ENCOUNTER — Telehealth: Payer: Self-pay | Admitting: Family Medicine

## 2020-01-30 ENCOUNTER — Other Ambulatory Visit: Payer: Medicare Other

## 2020-01-30 DIAGNOSIS — Z7901 Long term (current) use of anticoagulants: Secondary | ICD-10-CM

## 2020-01-30 NOTE — Telephone Encounter (Unsigned)
Copied from [place] [id number]. Topic: General - Other >> Jan 30, 2020  8:06 AM Celene Kras wrote: Reason for CRM: Pt called and is requesting to have a paper copy of an INR PT test to take to [facility]. Please advise .

## 2020-01-30 NOTE — Telephone Encounter (Signed)
Put on sched and hit arrived so I can print it off

## 2020-01-31 ENCOUNTER — Other Ambulatory Visit: Payer: Self-pay

## 2020-01-31 LAB — PROTIME-INR
INR: 2.4 — ABNORMAL HIGH (ref 0.9–1.2)
Prothrombin Time: 24.6 s — ABNORMAL HIGH (ref 9.1–12.0)

## 2020-02-01 ENCOUNTER — Other Ambulatory Visit: Payer: Self-pay

## 2020-02-01 ENCOUNTER — Ambulatory Visit (INDEPENDENT_AMBULATORY_CARE_PROVIDER_SITE_OTHER): Payer: Medicare Other | Admitting: Vascular Surgery

## 2020-02-01 ENCOUNTER — Encounter (INDEPENDENT_AMBULATORY_CARE_PROVIDER_SITE_OTHER): Payer: Self-pay | Admitting: Vascular Surgery

## 2020-02-01 VITALS — BP 128/81 | HR 98 | Resp 18 | Ht 72.0 in | Wt 253.0 lb

## 2020-02-01 DIAGNOSIS — E1121 Type 2 diabetes mellitus with diabetic nephropathy: Secondary | ICD-10-CM | POA: Diagnosis not present

## 2020-02-01 DIAGNOSIS — E1122 Type 2 diabetes mellitus with diabetic chronic kidney disease: Secondary | ICD-10-CM | POA: Diagnosis not present

## 2020-02-01 DIAGNOSIS — I10 Essential (primary) hypertension: Secondary | ICD-10-CM | POA: Diagnosis not present

## 2020-02-01 DIAGNOSIS — K219 Gastro-esophageal reflux disease without esophagitis: Secondary | ICD-10-CM

## 2020-02-01 DIAGNOSIS — N184 Chronic kidney disease, stage 4 (severe): Secondary | ICD-10-CM | POA: Diagnosis not present

## 2020-02-01 DIAGNOSIS — I482 Chronic atrial fibrillation, unspecified: Secondary | ICD-10-CM

## 2020-02-04 ENCOUNTER — Encounter (INDEPENDENT_AMBULATORY_CARE_PROVIDER_SITE_OTHER): Payer: Self-pay | Admitting: Vascular Surgery

## 2020-02-04 NOTE — H&P (View-Only) (Signed)
MRN : [id number]  Mark Bauer is a 64 y.o. (05-21-1956) male who presents with chief complaint of  Chief Complaint  Patient presents with   New Patient (Initial Visit)    discuss access placement  .  History of Present Illness:   The patient is seen for evaluation for dialysis access. The patient has chronic renal insufficiency stage V secondary to hypertension and diabetes. The patient's most recent creatinine clearance is less than 20. The patient volume status has not yet become an issue. Patient's blood pressures been relatively well controlled. There are mild uremic symptoms which appear to be relatively well tolerated at this time. The patient is right-handed.  The patient has been considering the various methods of dialysis and wishes to proceed with hemodialysis and therefore creation of AV access.  The patient denies amaurosis fugax or recent TIA symptoms. There are no recent neurological changes noted. The patient denies claudication symptoms or rest pain symptoms. The patient denies history of DVT, PE or superficial thrombophlebitis. The patient denies recent episodes of angina or shortness of breath.   Vein mapping does not show an adequate cephalic vein bilaterally but the basilic vein is 4.0 mm or greater on the left  Current Meds  Medication Sig   allopurinol (ZYLOPRIM) 100 MG tablet Take 1 tablet (100 mg total) by mouth daily.   amLODipine (NORVASC) 10 MG tablet Take 1 tablet (10 mg total) by mouth daily.   Cholecalciferol (D3 SUPER STRENGTH) 2000 units CAPS Take 1 capsule by mouth as directed. M-Friday   Cyanocobalamin (VITAMIN B 12 PO) Take 1 tablet by mouth daily.   Insulin Pen Needle (NOVOFINE) 32G X 6 MM MISC 1 needle daily-   liraglutide (VICTOZA) 18 MG/3ML SOPN INJECT SUBCUTANEOUSLY 1.8MG  ONCE A DAY   loratadine (CLARITIN) 10 MG tablet Take 10 mg by mouth daily. otc   lovastatin (MEVACOR) 20 MG tablet Take 1 tablet (20 mg total) by mouth daily.     metoprolol succinate (TOPROL-XL) 100 MG 24 hr tablet Take 1 tablet (100 mg total) by mouth daily. Take with or immediately following a meal.   pantoprazole (PROTONIX) 40 MG tablet Take 1 tablet (40 mg total) by mouth daily.   spironolactone (ALDACTONE) 25 MG tablet Take 25 mg by mouth daily.   torsemide (DEMADEX) 10 MG tablet Take 10 mg by mouth daily.   warfarin (COUMADIN) 4 MG tablet Take 1 tablet by mouth daily.   warfarin (JANTOVEN) 5 MG tablet Take 1 tablet (5 mg total) by mouth daily.    Past Medical History:  Diagnosis Date   Allergy    Diabetes mellitus without complication (HCC)    GERD (gastroesophageal reflux disease)    Gout    Hypertension    Hypokalemia    Mixed hyperlipidemia    Polycystic kidney    Vitamin D deficiency     Past Surgical History:  Procedure Laterality Date   COLONOSCOPY  2012   hemorrhoids    Social History Social History   Tobacco Use   Smoking status: Never Smoker   Smokeless tobacco: Never Used  Substance Use Topics   Alcohol use: No    Alcohol/week: 0.0 standard drinks   Drug use: No    Family History Family History  Problem Relation Age of Onset   Diabetes Mother    Heart disease Father   No family history of bleeding/clotting disorders, porphyria or autoimmune disease   Allergies  Allergen Reactions   Caffeine  Ciprofloxacin Other (See Comments)   Pollen Extract Other (See Comments)    Sneezing, watery eyes, etc.     REVIEW OF SYSTEMS (Negative unless checked)  Constitutional: [] Weight loss  [] Fever  [] Chills Cardiac: [] Chest pain   [] Chest pressure   [] Palpitations   [] Shortness of breath when laying flat   [] Shortness of breath with exertion. Vascular:  [] Pain in legs with walking   [] Pain in legs at rest  [] History of DVT   [] Phlebitis   [] Swelling in legs   [] Varicose veins   [] Non-healing ulcers Pulmonary:   [] Uses home oxygen   [] Productive cough   [] Hemoptysis   [] Wheeze  [] COPD    [] Asthma Neurologic:  [] Dizziness   [] Seizures   [] History of stroke   [] History of TIA  [] Aphasia   [] Vissual changes   [] Weakness or numbness in arm   [] Weakness or numbness in leg Musculoskeletal:   [] Joint swelling   [] Joint pain   [] Low back pain Hematologic:  [] Easy bruising  [] Easy bleeding   [] Hypercoagulable state   [] Anemic Gastrointestinal:  [] Diarrhea   [] Vomiting  [x] Gastroesophageal reflux/heartburn   [] Difficulty swallowing. Genitourinary:  [x] Chronic kidney disease   [] Difficult urination  [] Frequent urination   [] Blood in urine Skin:  [] Rashes   [] Ulcers  Psychological:  [] History of anxiety   []  History of major depression.  Physical Examination  Vitals:   02/01/20 0854  BP: 128/81  Pulse: 98  Resp: 18  Weight: 253 lb (114.8 kg)  Height: 6' (1.829 m)   Body mass index is 34.31 kg/m. Gen: WD/WN, NAD Head: [place]/AT, No temporalis wasting.  Ear/Nose/Throat: Hearing grossly intact, nares w/o erythema or drainage, poor dentition Eyes: PER, EOMI, sclera nonicteric.  Neck: Supple, no masses.  No bruit or JVD.  Pulmonary:  Good air movement, clear to auscultation bilaterally, no use of accessory muscles.  Cardiac: RRR, normal S1, S2, no Murmurs. Vascular:  Vessel Right Left  Radial Palpable Palpable  Brachial Palpable Palpable  Gastrointestinal: soft, non-distended. No guarding/no peritoneal signs.  Musculoskeletal: M/S 5/5 throughout.  No deformity or atrophy.  Neurologic: CN 2-12 intact. Pain and light touch intact in extremities.  Symmetrical.  Speech is fluent. Motor exam as listed above. Psychiatric: Judgment intact, Mood & affect appropriate for pt's clinical situation. Dermatologic: No rashes or ulcers noted.  No changes consistent with cellulitis.   CBC Lab Results  Component Value Date   WBC 7.1 01/09/2019   HGB 13.1 01/09/2019   HCT 38.2 01/09/2019   MCV 98 (H) 01/09/2019   PLT 172 01/09/2019    BMET    Component Value Date/Time   NA 142 01/02/2019  1042   K 3.6 01/02/2019 1042   CL 103 01/02/2019 1042   CO2 19 (L) 01/02/2019 1042   GLUCOSE 103 (H) 01/02/2019 1042   BUN 54 (A) 09/27/2019 0000   CREATININE 4.2 (A) 09/27/2019 0000   CREATININE 3.55 (H) 01/02/2019 1042   CALCIUM 9.8 01/02/2019 1042   GFRNONAA 17 (L) 01/02/2019 1042   GFRAA 20 (L) 01/02/2019 1042   CrCl cannot be calculated (Patient's most recent lab result is older than the maximum 21 days allowed.).  COAG Lab Results  Component Value Date   INR 2.4 (H) 01/30/2020   INR 2.2 (H) 01/02/2020   INR 2.1 (H) 12/07/2019    Radiology No results found.   Assessment/Plan 1. CKD stage 4 due to type 2 diabetes mellitus ([place]) Recommend:  At this time the patient does not have appropriate extremity access  for dialysis  Patient should have a left basilic transposition single stage created.  The risks, benefits and alternative therapies were reviewed in detail with the patient.  All questions were answered.  The patient agrees to proceed with surgery.    2. Type 2 diabetes mellitus with diabetic nephropathy, without long-term current use of insulin ([place]) Continue hypoglycemic medications as already ordered, these medications have been reviewed and there are no changes at this time.  Hgb A1C to be monitored as already arranged by primary service   3. Essential hypertension Continue antihypertensive medications as already ordered, these medications have been reviewed and there are no changes at this time.   4. Chronic atrial fibrillation (HCC) Continue antiarrhythmia medications as already ordered, these medications have been reviewed and there are no changes at this time.  Continue anticoagulation as ordered by Cardiology Service   5. Gastroesophageal reflux disease, unspecified whether esophagitis present Continue PPI as already ordered, this medication has been reviewed and there are no changes at this time.  Avoidence of caffeine and alcohol  Moderate  elevation of the head of the bed     Hortencia Pilar, MD  02/04/2020 2:57 PM

## 2020-02-04 NOTE — Progress Notes (Signed)
MRN : [id number]  Mark Bauer is a 64 y.o. (Nov 29, 1955) male who presents with chief complaint of  Chief Complaint  Patient presents with  . New Patient (Initial Visit)    discuss access placement  .  History of Present Illness:   The patient is seen for evaluation for dialysis access. The patient has chronic renal insufficiency stage V secondary to hypertension and diabetes. The patient's most recent creatinine clearance is less than 20. The patient volume status has not yet become an issue. Patient's blood pressures been relatively well controlled. There are mild uremic symptoms which appear to be relatively well tolerated at this time. The patient is right-handed.  The patient has been considering the various methods of dialysis and wishes to proceed with hemodialysis and therefore creation of AV access.  The patient denies amaurosis fugax or recent TIA symptoms. There are no recent neurological changes noted. The patient denies claudication symptoms or rest pain symptoms. The patient denies history of DVT, PE or superficial thrombophlebitis. The patient denies recent episodes of angina or shortness of breath.   Vein mapping does not show an adequate cephalic vein bilaterally but the basilic vein is 4.0 mm or greater on the left  Current Meds  Medication Sig  . allopurinol (ZYLOPRIM) 100 MG tablet Take 1 tablet (100 mg total) by mouth daily.  Marland Kitchen amLODipine (NORVASC) 10 MG tablet Take 1 tablet (10 mg total) by mouth daily.  . Cholecalciferol (D3 SUPER STRENGTH) 2000 units CAPS Take 1 capsule by mouth as directed. M-Friday  . Cyanocobalamin (VITAMIN B 12 PO) Take 1 tablet by mouth daily.  . Insulin Pen Needle (NOVOFINE) 32G X 6 MM MISC 1 needle daily-  . liraglutide (VICTOZA) 18 MG/3ML SOPN INJECT SUBCUTANEOUSLY 1.8MG  ONCE A DAY  . loratadine (CLARITIN) 10 MG tablet Take 10 mg by mouth daily. otc  . lovastatin (MEVACOR) 20 MG tablet Take 1 tablet (20 mg total) by mouth daily.    . metoprolol succinate (TOPROL-XL) 100 MG 24 hr tablet Take 1 tablet (100 mg total) by mouth daily. Take with or immediately following a meal.  . pantoprazole (PROTONIX) 40 MG tablet Take 1 tablet (40 mg total) by mouth daily.  Marland Kitchen spironolactone (ALDACTONE) 25 MG tablet Take 25 mg by mouth daily.  Marland Kitchen torsemide (DEMADEX) 10 MG tablet Take 10 mg by mouth daily.  Marland Kitchen warfarin (COUMADIN) 4 MG tablet Take 1 tablet by mouth daily.  Marland Kitchen warfarin (JANTOVEN) 5 MG tablet Take 1 tablet (5 mg total) by mouth daily.    Past Medical History:  Diagnosis Date  . Allergy   . Diabetes mellitus without complication ([place])   . GERD (gastroesophageal reflux disease)   . Gout   . Hypertension   . Hypokalemia   . Mixed hyperlipidemia   . Polycystic kidney   . Vitamin D deficiency     Past Surgical History:  Procedure Laterality Date  . COLONOSCOPY  2012   hemorrhoids    Social History Social History   Tobacco Use  . Smoking status: Never Smoker  . Smokeless tobacco: Never Used  Substance Use Topics  . Alcohol use: No    Alcohol/week: 0.0 standard drinks  . Drug use: No    Family History Family History  Problem Relation Age of Onset  . Diabetes Mother   . Heart disease Father   No family history of bleeding/clotting disorders, porphyria or autoimmune disease   Allergies  Allergen Reactions  . Caffeine   .  Ciprofloxacin Other (See Comments)  . Pollen Extract Other (See Comments)    Sneezing, watery eyes, etc.     REVIEW OF SYSTEMS (Negative unless checked)  Constitutional: [] Weight loss  [] Fever  [] Chills Cardiac: [] Chest pain   [] Chest pressure   [] Palpitations   [] Shortness of breath when laying flat   [] Shortness of breath with exertion. Vascular:  [] Pain in legs with walking   [] Pain in legs at rest  [] History of DVT   [] Phlebitis   [] Swelling in legs   [] Varicose veins   [] Non-healing ulcers Pulmonary:   [] Uses home oxygen   [] Productive cough   [] Hemoptysis   [] Wheeze  [] COPD    [] Asthma Neurologic:  [] Dizziness   [] Seizures   [] History of stroke   [] History of TIA  [] Aphasia   [] Vissual changes   [] Weakness or numbness in arm   [] Weakness or numbness in leg Musculoskeletal:   [] Joint swelling   [] Joint pain   [] Low back pain Hematologic:  [] Easy bruising  [] Easy bleeding   [] Hypercoagulable state   [] Anemic Gastrointestinal:  [] Diarrhea   [] Vomiting  [x] Gastroesophageal reflux/heartburn   [] Difficulty swallowing. Genitourinary:  [x] Chronic kidney disease   [] Difficult urination  [] Frequent urination   [] Blood in urine Skin:  [] Rashes   [] Ulcers  Psychological:  [] History of anxiety   []  History of major depression.  Physical Examination  Vitals:   02/01/20 0854  BP: 128/81  Pulse: 98  Resp: 18  Weight: 253 lb (114.8 kg)  Height: 6' (1.829 m)   Body mass index is 34.31 kg/m. Gen: WD/WN, NAD Head: [place]/AT, No temporalis wasting.  Ear/Nose/Throat: Hearing grossly intact, nares w/o erythema or drainage, poor dentition Eyes: PER, EOMI, sclera nonicteric.  Neck: Supple, no masses.  No bruit or JVD.  Pulmonary:  Good air movement, clear to auscultation bilaterally, no use of accessory muscles.  Cardiac: RRR, normal S1, S2, no Murmurs. Vascular:  Vessel Right Left  Radial Palpable Palpable  Brachial Palpable Palpable  Gastrointestinal: soft, non-distended. No guarding/no peritoneal signs.  Musculoskeletal: M/S 5/5 throughout.  No deformity or atrophy.  Neurologic: CN 2-12 intact. Pain and light touch intact in extremities.  Symmetrical.  Speech is fluent. Motor exam as listed above. Psychiatric: Judgment intact, Mood & affect appropriate for pt's clinical situation. Dermatologic: No rashes or ulcers noted.  No changes consistent with cellulitis.   CBC Lab Results  Component Value Date   WBC 7.1 01/09/2019   HGB 13.1 01/09/2019   HCT 38.2 01/09/2019   MCV 98 (H) 01/09/2019   PLT 172 01/09/2019    BMET    Component Value Date/Time   NA 142 01/02/2019  1042   K 3.6 01/02/2019 1042   CL 103 01/02/2019 1042   CO2 19 (L) 01/02/2019 1042   GLUCOSE 103 (H) 01/02/2019 1042   BUN 54 (A) 09/27/2019 0000   CREATININE 4.2 (A) 09/27/2019 0000   CREATININE 3.55 (H) 01/02/2019 1042   CALCIUM 9.8 01/02/2019 1042   GFRNONAA 17 (L) 01/02/2019 1042   GFRAA 20 (L) 01/02/2019 1042   CrCl cannot be calculated (Patient's most recent lab result is older than the maximum 21 days allowed.).  COAG Lab Results  Component Value Date   INR 2.4 (H) 01/30/2020   INR 2.2 (H) 01/02/2020   INR 2.1 (H) 12/07/2019    Radiology No results found.   Assessment/Plan 1. CKD stage 4 due to type 2 diabetes mellitus ([employer]) Recommend:  At this time the patient does not have appropriate extremity access  for dialysis  Patient should have a left basilic transposition single stage created.  The risks, benefits and alternative therapies were reviewed in detail with the patient.  All questions were answered.  The patient agrees to proceed with surgery.    2. Type 2 diabetes mellitus with diabetic nephropathy, without long-term current use of insulin ([place]) Continue hypoglycemic medications as already ordered, these medications have been reviewed and there are no changes at this time.  Hgb A1C to be monitored as already arranged by primary service   3. Essential hypertension Continue antihypertensive medications as already ordered, these medications have been reviewed and there are no changes at this time.   4. Chronic atrial fibrillation (HCC) Continue antiarrhythmia medications as already ordered, these medications have been reviewed and there are no changes at this time.  Continue anticoagulation as ordered by Cardiology Service   5. Gastroesophageal reflux disease, unspecified whether esophagitis present Continue PPI as already ordered, this medication has been reviewed and there are no changes at this time.  Avoidence of caffeine and alcohol  Moderate  elevation of the head of the bed     Hortencia Pilar, MD  02/04/2020 2:57 PM

## 2020-02-06 DIAGNOSIS — N2581 Secondary hyperparathyroidism of renal origin: Secondary | ICD-10-CM | POA: Diagnosis not present

## 2020-02-06 DIAGNOSIS — Q613 Polycystic kidney, unspecified: Secondary | ICD-10-CM | POA: Diagnosis not present

## 2020-02-06 DIAGNOSIS — N184 Chronic kidney disease, stage 4 (severe): Secondary | ICD-10-CM | POA: Diagnosis not present

## 2020-02-10 ENCOUNTER — Other Ambulatory Visit: Payer: Self-pay | Admitting: Family Medicine

## 2020-02-10 DIAGNOSIS — K219 Gastro-esophageal reflux disease without esophagitis: Secondary | ICD-10-CM

## 2020-02-10 DIAGNOSIS — E1121 Type 2 diabetes mellitus with diabetic nephropathy: Secondary | ICD-10-CM

## 2020-02-10 DIAGNOSIS — I482 Chronic atrial fibrillation, unspecified: Secondary | ICD-10-CM

## 2020-02-10 DIAGNOSIS — E782 Mixed hyperlipidemia: Secondary | ICD-10-CM

## 2020-02-10 DIAGNOSIS — I10 Essential (primary) hypertension: Secondary | ICD-10-CM

## 2020-02-10 NOTE — Telephone Encounter (Signed)
Requested Prescriptions  Pending Prescriptions Disp Refills   Insulin Pen Needle (NOVOFINE) 32G X 6 MM MISC [Pharmacy Med Name: NOVOFINE 32  PEN 32GX6MM] 100 each 0    Sig: USE AND DISCARD 1 PEN      NEEDLE SUBCUTANEOUSLY [place] DIRECTED     Endocrinology: Diabetes - Testing Supplies Passed - 02/10/2020  8:13 AM      Passed - Valid encounter within last 12 months    Recent Outpatient Visits          2 months ago Hyperuricemia   [facility]ine Patch, [facility]   8 months ago Type 2 diabetes mellitus with diabetic nephropathy, without long-term current use of insulin ([place])   [facility] Jul[facility] Patch, MD   1 year ago Type 2 diabetes mellitus with diabetic nephropathy, without long-term current use of insulin ([place])   [facility] Juline Patch, MD   1 year ago Type 2 diabetes mellitus with diabetic nephropathy, without long-term current use of insulin ([place])   [facility]ine Patch, MD   2 years ago Type 2 diabetes mellitus with diabetic nephropathy, without long-term current use of insulin ([place])   [facility] Juline Patch, MD             Refused Prescriptions Disp Refills   JANTOVEN 5 MG tablet [Pharmacy Med Name: JANTOVEN TAB 5MG ] 90 tablet     Sig: TAKE 1 TABLET DAILY     Hematology:  Anticoagulants - warfarin Failed - 02/10/2020  8:13 AM      Failed - This refill cannot be delegated      Failed - If the patient is managed by [facility] Clinic - route to their Pool. If not, forward to the provider.      Failed - INR in normal range and within 30 days    INR  Date Value Ref Range Status  01/30/2020 2.4 (H) 0.9 - 1.2 Final    Comment:    Reference interval is for non-anticoagulated patients. Suggested INR therapeutic range for Vitamin K antagonist therapy:    Standard Dose (moderate intensity                   therapeutic range):       2.0 - 3.0    Higher intensity therapeutic range       2.5 - 3.5           Passed - Valid encounter within last 3 months    Recent Outpatient Visits          2 months ago Hyperuricemia   [facility], [facility]   8 months ago Type 2 diabetes mellitus with diabetic nephropathy, without long-term current use of insulin ([place])   [facility] Jul[facility] Patch, MD   1 year ago Type 2 diabetes mellitus with diabetic nephropathy, without long-term current use of insulin ([place])   [facility] Patch, MD   1 year ago Type 2 diabetes mellitus with diabetic nephropathy, without long-term current use of insulin ([place])   [facility]   2 years ago Type 2 diabetes mellitus with diabetic nephropathy, without long-term current use of insulin ([place])   [facility] Juline Patch, MD              metoprolol succinate (TOPROL-XL) 100 MG 24 hr tablet [Pharmacy Med Name: METOPROL SUC TAB 100MG   ER] 90 tablet     Sig: TAKE 1 TABLET DAILY WITH ORIMMEDIATELY FOLLOWING A    MEAL     Cardiovascular:  Beta Blockers Passed - 02/10/2020  8:13 AM      Passed - Last BP in normal range    BP Readings from Last 1 Encounters:  02/01/20 128/81         Passed - Last Heart Rate in normal range    Pulse Readings from Last 1 Encounters:  02/01/20 98         Passed - Valid encounter within last 6 months    Recent Outpatient Visits          2 months ago Hyperuricemia   [facility] Patch, MD   8 months ago Type 2 diabetes mellitus with diabetic nephropathy, without long-term current use of insulin ([place])   [facility], [facility]   1 year ago Type 2 diabetes mellitus with diabetic nephropathy, without long-term current use of insulin ([place])   [facility] Juline Patch, MD   1 year ago Type 2 diabetes mellitus with diabetic nephropathy, without long-term current use of insulin ([place])   [facility] Juline Patch, MD   2 years ago Type 2 diabetes mellitus  with diabetic nephropathy, without long-term current use of insulin ([place])   [facility] Juline Patch, MD              lovastatin (MEVACOR) 20 MG tablet [Pharmacy Med Name: LOVASTATIN TAB 20MG ] 90 tablet     Sig: TAKE 1 TABLET DAILY     Cardiovascular:  Antilipid - Statins Failed - 02/10/2020  8:13 AM      Failed - LDL in normal range and within 360 days    LDL Chol Calc (NIH)  Date Value Ref Range Status  12/07/2019 57 0 - 99 mg/dL Final         Failed - HDL in normal range and within 360 days    HDL  Date Value Ref Range Status  12/07/2019 35 (L) >39 mg/dL Final         Passed - Total Cholesterol in normal range and within 360 days    Cholesterol, Total  Date Value Ref Range Status  12/07/2019 106 100 - 199 mg/dL Final         Passed - Triglycerides in normal range and within 360 days    Triglycerides  Date Value Ref Range Status  12/07/2019 63 0 - 149 mg/dL Final         Passed - Patient is not pregnant      Passed - Valid encounter within last 12 months    Recent Outpatient Visits          2 months ago Hyperuricemia   [facility], [facility]   8 months ago Type 2 diabetes mellitus with diabetic nephropathy, without long-term current use of insulin ([place])   [facility], [facility]   1 year ago Type 2 diabetes mellitus with diabetic nephropathy, without long-term current use of insulin ([place])   [facility] Juline Patch, MD   1 year ago Type 2 diabetes mellitus with diabetic nephropathy, without long-term current use of insulin ([place])   [facility] Juline Patch, MD   2 years ago Type 2 diabetes mellitus with diabetic nephropathy, without long-term current use of insulin ([place])   [facility] Juline Patch, MD              liraglutide ([place]) 16 MG/3ML SOPN [Pharmacy Med Name: VICTOZA 3'S  PEN 18MG /3ML] 27 mL     Sig: INJECT 1.8MG  [place]     Endocrinology:   Diabetes - GLP-1 Receptor Agonists Passed - 02/10/2020  8:13 AM      Passed - HBA1C is between 0 and 7.9 and within 180 days    Hgb A1c MFr Bld  Date Value Ref Range Status  12/07/2019 6.0 (H) 4.8 - 5.6 % Final    Comment:             Prediabetes: 5.7 - 6.4          Diabetes: >6.4          Glycemic control for adults with diabetes: <7.0          Passed - Valid encounter within last 6 months    Recent Outpatient Visits          2 months ago Hyperuricemia   [facility] Patch, MD   8 months ago Type 2 diabetes mellitus with diabetic nephropathy, without long-term current use of insulin ([place])   [facility], [facility]   1 year ago Type 2 diabetes mellitus with diabetic nephropathy, without long-term current use of insulin ([place])   [facility] Jul[facility] Patch, MD   1 year ago Type 2 diabetes mellitus with diabetic nephropathy, without long-term current use of insulin ([place])   [facility] Juline [facility], MD   2 years ago Type 2 diabetes mellitus with diabetic nephropathy, without long-term current use of insulin ([place])   [facility] Juline Patch, MD              pantoprazole (PROTONIX) 40 MG tablet [Pharmacy Med Name: PANTOPRAZOLE TAB 40MG  DR] 90 tablet     Sig: TAKE 1 TABLET DAILY     Gastroenterology: Proton Pump Inhibitors Passed - 02/10/2020  8:13 AM      Passed - Valid encounter within last 12 months    Recent Outpatient Visits          2 months ago Hyperuricemia   [facility] Patch, MD   8 months ago Type 2 diabetes mellitus with diabetic nephropathy, without long-term current use of insulin ([place])   [facility] Jul[facility], MD   1 year ago Type 2 diabetes mellitus with diabetic nephropathy, without long-term current use of insulin ([place])   [facility] Jul[facility] Patch, MD   1 year ago Type 2 diabetes mellitus with diabetic nephropathy, without long-term current  use of insulin ([place])   [facility] Juline Patch, MD   2 years ago Type 2 diabetes mellitus with diabetic nephropathy, without long-term current use of insulin ([place])   [facility] Juline Patch, MD              losartan (COZAAR) 100 MG tablet [Pharmacy Med Name: LOSARTAN TAB 100MG ] 90 tablet     Sig: TAKE 1 TABLET DAILY     Cardiovascular:  Angiotensin Receptor Blockers Failed - 02/10/2020  8:13 AM      Failed - Cr in normal range and within 180 days    Creatinine  Date Value Ref Range Status  09/27/2019 4.2 (A) 0.6 - 1.3 Final   Creatinine, Ser  Date Value Ref Range Status  01/02/2019 3.55 (H) 0.76 - 1.27 mg/dL Final         Failed - K in normal range and within 180 days    Potassium  Date Value Ref Range Status  01/02/2019 3.6 3.5 - 5.2 mmol/L Final         Passed - Patient is not pregnant      Passed - Last BP in normal range    BP Readings from Last 1 Encounters:  02/01/20 128/81         Passed - Valid encounter within last 6 months    Recent Outpatient Visits          2 months ago Hyperuricemia   [facility] Jul[facility] Patch, MD   8 months ago Type 2 diabetes mellitus with diabetic nephropathy, without long-term current use of insulin ([place])   [facility] Jul[facility] Patch, MD   1 year ago Type 2 diabetes mellitus with diabetic nephropathy, without long-term current use of insulin ([place])   [facility] Juline Patch, MD   1 year ago Type 2 diabetes mellitus with diabetic nephropathy, without long-term current use of insulin ([place])   [facility] Jul[facility] Patch, MD   2 years ago Type 2 diabetes mellitus with diabetic nephropathy, without long-term current use of insulin [facility])   [facility] Juline Patch, MD

## 2020-02-10 NOTE — Telephone Encounter (Signed)
Requested Prescriptions  Pending Prescriptions Disp Refills  . JANTOVEN 5 MG tablet [Pharmacy Med Name: JANTOVEN TAB 5MG ] 90 tablet     Sig: TAKE 1 TABLET DAILY     Hematology:  Anticoagulants - warfarin Failed - 02/10/2020  8:13 AM      Failed - This refill cannot be delegated      Failed - If the patient is managed by Coumadin Clinic - route to their Pool. If not, forward to the provider.      Failed - INR in normal range and within 30 days    INR  Date Value Ref Range Status  01/30/2020 2.4 (H) 0.9 - 1.2 Final    Comment:    Reference interval is for non-anticoagulated patients. Suggested INR therapeutic range for Vitamin K antagonist therapy:    Standard Dose (moderate intensity                   therapeutic range):       2.0 - 3.0    Higher intensity therapeutic range       2.5 - 3.5          Passed - Valid encounter within last 3 months    Recent Outpatient Visits          2 months ago Hyperuricemia   [facility] Juline Patch, MD   8 months ago Type 2 diabetes mellitus with diabetic nephropathy, without long-term current use of insulin ([place])   [facility] Juline Patch, MD   1 year ago Type 2 diabetes mellitus with diabetic nephropathy, without long-term current use of insulin ([place])   [facility] Juline Patch, MD   1 year ago Type 2 diabetes mellitus with diabetic nephropathy, without long-term current use of insulin ([place])   [facility] Juline Patch, MD   2 years ago Type 2 diabetes mellitus with diabetic nephropathy, without long-term current use of insulin ([place])   [facility] Otilio Miu C, MD             . metoprolol succinate (TOPROL-XL) 100 MG 24 hr tablet [Pharmacy Med Name: METOPROL SUC TAB 100MG  ER] 90 tablet     Sig: TAKE 1 TABLET DAILY WITH ORIMMEDIATELY FOLLOWING A    MEAL     Cardiovascular:  Beta Blockers Passed - 02/10/2020  8:13 AM      Passed - Last BP in normal range    BP Readings  from Last 1 Encounters:  02/01/20 128/81         Passed - Last Heart Rate in normal range    Pulse Readings from Last 1 Encounters:  02/01/20 98         Passed - Valid encounter within last 6 months    Recent Outpatient Visits          2 months ago Hyperuricemia   [facility], MD   8 months ago Type 2 diabetes mellitus with diabetic nephropathy, without long-term current use of insulin ([place])   [facility] Jul[facility] Patch, MD   1 year ago Type 2 diabetes mellitus with diabetic nephropathy, without long-term current use of insulin ([place])   [facility] Juline Patch, MD   1 year ago Type 2 diabetes mellitus with diabetic nephropathy, without long-term current use of insulin ([place])   [facility] Juline Patch, MD   2 years ago Type 2 diabetes mellitus with  diabetic nephropathy, without long-term current use of insulin ([place])   [facility] Juline Patch, MD             . lovastatin (MEVACOR) 20 MG tablet [Pharmacy Med Name: LOVASTATIN TAB 20MG ] 90 tablet     Sig: TAKE 1 TABLET DAILY     Cardiovascular:  Antilipid - Statins Failed - 02/10/2020  8:13 AM      Failed - LDL in normal range and within 360 days    LDL Chol Calc (NIH)  Date Value Ref Range Status  12/07/2019 57 0 - 99 mg/dL Final         Failed - HDL in normal range and within 360 days    HDL  Date Value Ref Range Status  12/07/2019 35 (L) >39 mg/dL Final         Passed - Total Cholesterol in normal range and within 360 days    Cholesterol, Total  Date Value Ref Range Status  12/07/2019 106 100 - 199 mg/dL Final         Passed - Triglycerides in normal range and within 360 days    Triglycerides  Date Value Ref Range Status  12/07/2019 63 0 - 149 mg/dL Final         Passed - Patient is not pregnant      Passed - Valid encounter within last 12 months    Recent Outpatient Visits          2 months ago Hyperuricemia   [facility] Jul[facility], MD   8 months ago Type 2 diabetes mellitus with diabetic nephropathy, without long-term current use of insulin ([place])   [facility] Juline Patch, MD   1 year ago Type 2 diabetes mellitus with diabetic nephropathy, without long-term current use of insulin ([place])   [facility] Juline Patch, MD   1 year ago Type 2 diabetes mellitus with diabetic nephropathy, without long-term current use of insulin ([place])   [facility] Juline Patch, MD   2 years ago Type 2 diabetes mellitus with diabetic nephropathy, without long-term current use of insulin ([place])   [facility] Juline Patch, MD             . liraglutide ([place]) 37 MG/3ML SOPN [Pharmacy Med Name: VICTOZA 3'S  PEN 18MG /3ML] 27 mL     Sig: INJECT 1.8MG  [place]     Endocrinology:  Diabetes - GLP-1 Receptor Agonists Passed - 02/10/2020  8:13 AM      Passed - HBA1C is between 0 and 7.9 and within 180 days    Hgb A1c MFr Bld  Date Value Ref Range Status  12/07/2019 6.0 (H) 4.8 - 5.6 % Final    Comment:             Prediabetes: 5.7 - 6.4          Diabetes: >6.4          Glycemic control for adults with diabetes: <7.0          Passed - Valid encounter within last 6 months    Recent Outpatient Visits          2 months ago Hyperuricemia   [facility], [facility]   8 months ago Type 2 diabetes mellitus with diabetic nephropathy, without long-term current use of insulin ([place])   [facility] Juline Patch, MD   1 year ago Type  2 diabetes mellitus with diabetic nephropathy, without long-term current use of insulin ([place])   [facility] Juline Patch, MD   1 year ago Type 2 diabetes mellitus with diabetic nephropathy, without long-term current use of insulin ([place])   [facility] Jul[facility], MD   2 years ago Type 2 diabetes mellitus with diabetic nephropathy, without long-term current use of insulin  ([place])   [facility] Juline Patch, MD             . Insulin Pen Needle (NOVOFINE) 32G X 6 MM MISC [Pharmacy Med Name: NOVOFINE 32  PEN 32GX6MM] 100 each 0    Sig: USE AND DISCARD 1 PEN      NEEDLE SUBCUTANEOUSLY [place] DIRECTED     Endocrinology: Diabetes - Testing Supplies Passed - 02/10/2020  8:13 AM      Passed - Valid encounter within last 12 months    Recent Outpatient Visits          2 months ago Hyperuricemia   [facility] Juline Patch, MD   8 months ago Type 2 diabetes mellitus with diabetic nephropathy, without long-term current use of insulin ([place])   [facility] Juline Patch, MD   1 year ago Type 2 diabetes mellitus with diabetic nephropathy, without long-term current use of insulin ([place])   [facility] Juline Patch, MD   1 year ago Type 2 diabetes mellitus with diabetic nephropathy, without long-term current use of insulin ([place])   [facility] Juline Patch, MD   2 years ago Type 2 diabetes mellitus with diabetic nephropathy, without long-term current use of insulin ([place])   [facility] Juline Patch, MD             . pantoprazole (PROTONIX) 40 MG tablet [Pharmacy Med Name: PANTOPRAZOLE TAB 40MG  DR] 90 tablet     Sig: TAKE 1 TABLET DAILY     Gastroenterology: Proton Pump Inhibitors Passed - 02/10/2020  8:13 AM      Passed - Valid encounter within last 12 months    Recent Outpatient Visits          2 months ago Hyperuricemia   [facility] Juline Patch, MD   8 months ago Type 2 diabetes mellitus with diabetic nephropathy, without long-term current use of insulin ([place])   [facility] Juline Patch, MD   1 year ago Type 2 diabetes mellitus with diabetic nephropathy, without long-term current use of insulin ([place])   [facility] Juline Patch, MD   1 year ago Type 2 diabetes mellitus with diabetic nephropathy, without long-term current use of insulin ([place])    [facility] Juline Patch, MD   2 years ago Type 2 diabetes mellitus with diabetic nephropathy, without long-term current use of insulin ([place])   [facility] Juline Patch, MD             . losartan (COZAAR) 100 MG tablet [Pharmacy Med Name: LOSARTAN TAB 100MG ] 90 tablet     Sig: TAKE 1 TABLET DAILY     Cardiovascular:  Angiotensin Receptor Blockers Failed - 02/10/2020  8:13 AM      Failed - Cr in normal range and within 180 days    Creatinine  Date Value Ref Range Status  09/27/2019 4.2 (A) 0.6 - 1.3 Final   Creatinine, Ser  Date Value Ref Range Status  01/02/2019 3.55 (H) 0.76 -  1.27 mg/dL Final         Failed - K in normal range and within 180 days    Potassium  Date Value Ref Range Status  01/02/2019 3.6 3.5 - 5.2 mmol/L Final         Passed - Patient is not pregnant      Passed - Last BP in normal range    BP Readings from Last 1 Encounters:  02/01/20 128/81         Passed - Valid encounter within last 6 months    Recent Outpatient Visits          2 months ago Hyperuricemia   [facility] Jul[facility] Patch, MD   8 months ago Type 2 diabetes mellitus with diabetic nephropathy, without long-term current use of insulin ([place])   [facility] Juline Patch, MD   1 year ago Type 2 diabetes mellitus with diabetic nephropathy, without long-term current use of insulin ([place])   [facility] Juline Patch, MD   1 year ago Type 2 diabetes mellitus with diabetic nephropathy, without long-term current use of insulin ([place])   [facility] Juline Patch, MD   2 years ago Type 2 diabetes mellitus with diabetic nephropathy, without long-term current use of insulin [facility])   [facility] Juline Patch, MD

## 2020-02-13 ENCOUNTER — Telehealth (INDEPENDENT_AMBULATORY_CARE_PROVIDER_SITE_OTHER): Payer: Self-pay

## 2020-02-13 NOTE — Telephone Encounter (Signed)
Spoke with the patient and he is scheduled with Dr. Delana Meyer for a left basilic transposition on 02/23/20 at the [facility]. Pre-op is scheduled on 02/16/20 at 1:45 pm at the [facility], covid testing is at the 02/21/20 between 8-1 pm at the [facility]. Pre-surgical instructions were discussed and will be mailed.

## 2020-02-15 ENCOUNTER — Other Ambulatory Visit (INDEPENDENT_AMBULATORY_CARE_PROVIDER_SITE_OTHER): Payer: Self-pay | Admitting: Nurse Practitioner

## 2020-02-16 ENCOUNTER — Other Ambulatory Visit: Payer: Self-pay

## 2020-02-16 ENCOUNTER — Encounter
Admission: RE | Admit: 2020-02-16 | Discharge: 2020-02-16 | Disposition: A | Discharge status: Home / Self Care | Payer: Medicare Other | Source: Ambulatory Visit | Attending: Vascular Surgery | Admitting: Vascular Surgery

## 2020-02-16 DIAGNOSIS — Z01810 Encounter for preprocedural cardiovascular examination: Secondary | ICD-10-CM | POA: Diagnosis not present

## 2020-02-16 DIAGNOSIS — Z01812 Encounter for preprocedural laboratory examination: Secondary | ICD-10-CM | POA: Insufficient documentation

## 2020-02-16 DIAGNOSIS — I4891 Unspecified atrial fibrillation: Secondary | ICD-10-CM | POA: Insufficient documentation

## 2020-02-16 DIAGNOSIS — N186 End stage renal disease: Secondary | ICD-10-CM | POA: Insufficient documentation

## 2020-02-16 HISTORY — DX: Inflammatory liver disease, unspecified: K75.9

## 2020-02-16 HISTORY — DX: Undescended testicle, unspecified, bilateral: Q53.20

## 2020-02-16 HISTORY — DX: Malignant (primary) neoplasm, unspecified: C80.1

## 2020-02-16 HISTORY — DX: Cardiac arrhythmia, unspecified: I49.9

## 2020-02-16 LAB — CBC WITH DIFFERENTIAL/PLATELET
Abs Immature Granulocytes: 0.02 10*3/uL (ref 0.00–0.07)
Basophils Absolute: 0.1 10*3/uL (ref 0.0–0.1)
Basophils Relative: 1 %
Eosinophils Absolute: 0.2 10*3/uL (ref 0.0–0.5)
Eosinophils Relative: 3 %
HCT: 37.1 % — ABNORMAL LOW (ref 39.0–52.0)
Hemoglobin: 12.6 g/dL — ABNORMAL LOW (ref 13.0–17.0)
Immature Granulocytes: 0 %
Lymphocytes Relative: 16 %
Lymphs Abs: 1 10*3/uL (ref 0.7–4.0)
MCH: 32.1 pg (ref 26.0–34.0)
MCHC: 34 g/dL (ref 30.0–36.0)
MCV: 94.6 fL (ref 80.0–100.0)
Monocytes Absolute: 0.6 10*3/uL (ref 0.1–1.0)
Monocytes Relative: 10 %
Neutro Abs: 4.4 10*3/uL (ref 1.7–7.7)
Neutrophils Relative %: 70 %
Platelets: 157 10*3/uL (ref 150–400)
RBC: 3.92 MIL/uL — ABNORMAL LOW (ref 4.22–5.81)
RDW: 15.3 % (ref 11.5–15.5)
WBC: 6.2 10*3/uL (ref 4.0–10.5)
nRBC: 0 % (ref 0.0–0.2)

## 2020-02-16 LAB — BASIC METABOLIC PANEL
Anion gap: 11 (ref 5–15)
BUN: 78 mg/dL — ABNORMAL HIGH (ref 8–23)
CO2: 21 mmol/L — ABNORMAL LOW (ref 22–32)
Calcium: 9.6 mg/dL (ref 8.9–10.3)
Chloride: 106 mmol/L (ref 98–111)
Creatinine, Ser: 6.46 mg/dL — ABNORMAL HIGH (ref 0.61–1.24)
GFR calc Af Amer: 9 mL/min — ABNORMAL LOW (ref >60)
GFR calc non Af Amer: 8 mL/min — ABNORMAL LOW (ref >60)
Glucose, Bld: 109 mg/dL — ABNORMAL HIGH (ref 70–99)
Potassium: 5.1 mmol/L (ref 3.5–5.1)
Sodium: 138 mmol/L (ref 135–145)

## 2020-02-16 LAB — TYPE AND SCREEN
ABO/RH(D): A NEG
Antibody Screen: NEGATIVE

## 2020-02-16 NOTE — Progress Notes (Signed)
  [facility] Perioperative Services: Pre-Admission/Anesthesia Testing  Abnormal Lab Notification    Date: 02/16/20  Name: BLUFORD SEDLER MRN:   [id number]  Re: Abnormal labs noted during PAT appointment   Provider(s) Notified: Eulogio Ditch, NP Notification mode: Routed and/or faxed via [facility]   ABNORMAL LAB VALUE(S): Lab Results  Component Value Date   BUN 78 (H) 02/16/2020   CREATININE 6.46 (H) 02/16/2020  Estimated Creatinine Clearance: 14.7 mL/min (A) (by C-G formula based on SCr of 6.46 mg/dL (H)).  Notes: Patient with ESRD diagnosis. Patient has decided to pursue hemodialysis and therefore needs AV access. He is scheduled for a LEFT basilic vein transposition on 02/23/2020 with Dr. Delana Meyer.This is a Community education officer; no formal response is required.  Honor Loh, MSN, APRN, FNP-C, CEN [facility]  Peri-operative Services Nurse Practitioner Phone: [phone] 02/16/20 3:28 PM

## 2020-02-16 NOTE — Patient Instructions (Signed)
Your procedure is scheduled on: Friday 8/6 Report to Day Surgery. To find out your arrival time please call [phone] between 1PM - 3PM on Thurs 8/5.  Remember: Instructions that are not followed completely may result in serious medical risk,  up to and including death, or upon the discretion of your surgeon and anesthesiologist your  surgery may need to be rescheduled.     _X__ 1. Do not eat food after midnight the night before your procedure.                 No gum chewing or hard candies. You may drink clear liquids up to 2 hours                 before you are scheduled to arrive for your surgery- DO not drink clear                 liquids within 2 hours of the start of your surgery.                 Clear Liquids include:  water, apple juice without pulp, clear Gatorade, G2 or                  Gatorade Zero (avoid Red/Purple/Blue), Black Coffee or Tea (Do not add                 anything to coffee or tea). _____2.   Complete the carbohydrate drink provided to you, 2 hours before arrival.  __X__2.  On the morning of surgery brush your teeth with toothpaste and water, you                may rinse your mouth with mouthwash if you wish.  Do not swallow any toothpaste of mouthwash.     ___ 3.  No Alcohol for 24 hours before or after surgery.   ___ 4.  Do Not Smoke or use e-cigarettes For 24 Hours Prior to Your Surgery.                 Do not use any chewable tobacco products for at least 6 hours prior to                 Surgery.  ___  5.  Do not use any recreational drugs (marijuana, cocaine, heroin, ecstacy, MDMA or other)                For at least one week prior to your surgery.  Combination of these drugs with anesthesia                May have life threatening results.  ____  6.  Bring all medications with you on the day of surgery if instructed.   _x___  7.  Notify your doctor if there is any change in your medical condition      (cold, fever,  infections).     Do not wear jewelry, Do not wear lotions,  Do not shave 48 hours prior to surgery. Men may shave face and neck. Do not bring valuables to the hospital.    [facility] is not responsible for any belongings or valuables.  Contacts, dentures or bridgework may not be worn into surgery. Leave your suitcase in the car. After surgery it may be brought to your room. For patients admitted to the hospital, discharge time is determined by your treatment team.   Patients discharged the day of surgery will not be  allowed to drive home.   Make arrangements for someone to be with you for the first 24 hours of your Same Day Discharge.    Please read over the following fact sheets that you were given:    _x___ Take these medicines the morning of surgery with A SIP OF WATER:    1. amLODipine (NORVASC) 10 MG tablet  2. metoprolol succinate (TOPROL-XL) 100 MG 24 hr tablet  3. pantoprazole (PROTONIX) 40 MG tablet  Dose in the evening before the surgery and the morning of the surgery  4.  5.  6.  ____ Fleet Enema (as directed)   __x__ Use CHG Soap (or wipes) as directed  ____ Use Benzoyl Peroxide Gel as instructed  ____ Use inhalers on the day of surgery  ____ Stop metformin 2 days prior to surgery    __x__ No insulin the morning          of surgery.   __x__ Stop Coumadin 3 days before and 3 days after the surgery last dose on 8/2  ____ Stop Anti-inflammatories    ____ Stop supplements until after surgery.    ____ Bring C-Pap to the hospital.

## 2020-02-17 ENCOUNTER — Other Ambulatory Visit: Payer: Self-pay | Admitting: Family Medicine

## 2020-02-17 DIAGNOSIS — E782 Mixed hyperlipidemia: Secondary | ICD-10-CM

## 2020-02-17 DIAGNOSIS — I482 Chronic atrial fibrillation, unspecified: Secondary | ICD-10-CM

## 2020-02-17 DIAGNOSIS — K219 Gastro-esophageal reflux disease without esophagitis: Secondary | ICD-10-CM

## 2020-02-17 DIAGNOSIS — E1121 Type 2 diabetes mellitus with diabetic nephropathy: Secondary | ICD-10-CM

## 2020-02-17 DIAGNOSIS — I10 Essential (primary) hypertension: Secondary | ICD-10-CM

## 2020-02-17 NOTE — Telephone Encounter (Signed)
Requested medication (s) are due for refill today: no  Requested medication (s) are on the active medication list: yes  Last refill:  12/07/19  Future visit scheduled: no  Notes to clinic:  refill cannot be delegated to refill or refuse   Requested Prescriptions  Pending Prescriptions Disp Refills   JANTOVEN 5 MG tablet [Pharmacy Med Name: JANTOVEN TAB 5MG ] 90 tablet 0    Sig: TAKE 1 TABLET DAILY      Hematology:  Anticoagulants - warfarin Failed - 02/17/2020  3:46 PM      Failed - This refill cannot be delegated      Failed - If the patient is managed by Coumadin Clinic - route to their Pool. If not, forward to the provider.      Failed - INR in normal range and within 30 days    INR  Date Value Ref Range Status  01/30/2020 2.4 (H) 0.9 - 1.2 Final    Comment:    Reference interval is for non-anticoagulated patients. Suggested INR therapeutic range for Vitamin K antagonist therapy:    Standard Dose (moderate intensity                   therapeutic range):       2.0 - 3.0    Higher intensity therapeutic range       2.5 - 3.5           Passed - Valid encounter within last 3 months    Recent Outpatient Visits           2 months ago Hyperuricemia   [facility] Juline Patch, MD   8 months ago Type 2 diabetes mellitus with diabetic nephropathy, without long-term current use of insulin ([place])   [facility] Juline Patch, MD   1 year ago Type 2 diabetes mellitus with diabetic nephropathy, without long-term current use of insulin ([place])   [facility] Juline Patch, MD   1 year ago Type 2 diabetes mellitus with diabetic nephropathy, without long-term current use of insulin ([place])   [facility] Juline Patch, MD   2 years ago Type 2 diabetes mellitus with diabetic nephropathy, without long-term current use of insulin ([place])   [facility] Juline Patch, MD               Refused Prescriptions Disp Refills    metoprolol succinate (TOPROL-XL) 100 MG 24 hr tablet [Pharmacy Med Name: METOPROL SUC TAB 100MG  ER] 90 tablet     Sig: TAKE 1 TABLET DAILY WITH ORIMMEDIATELY FOLLOWING A    MEAL      Cardiovascular:  Beta Blockers Failed - 02/17/2020  3:46 PM      Failed - Last BP in normal range    BP Readings from Last 1 Encounters:  02/16/20 (!) 135/93          Passed - Last Heart Rate in normal range    Pulse Readings from Last 1 Encounters:  02/16/20 45          Passed - Valid encounter within last 6 months    Recent Outpatient Visits           2 months ago Hyperuricemia   [facility] Juline Patch, MD   8 months ago Type 2 diabetes mellitus with diabetic nephropathy, without long-term current use of insulin ([place])   [facility] Juline Patch, MD   1 year ago Type  2 diabetes mellitus with diabetic nephropathy, without long-term current use of insulin ([place])   [facility] Juline Patch, MD   1 year ago Type 2 diabetes mellitus with diabetic nephropathy, without long-term current use of insulin ([place])   [facility] Jul[facility], MD   2 years ago Type 2 diabetes mellitus with diabetic nephropathy, without long-term current use of insulin ([place])   [facility] Juline Patch, MD                pantoprazole (PROTONIX) 40 MG tablet [Pharmacy Med Name: PANTOPRAZOLE TAB 40MG  DR] 90 tablet     Sig: TAKE 1 TABLET DAILY      Gastroenterology: Proton Pump Inhibitors Passed - 02/17/2020  3:46 PM      Passed - Valid encounter within last 12 months    Recent Outpatient Visits           2 months ago Hyperuricemia   [facility] Juline Patch, MD   8 months ago Type 2 diabetes mellitus with diabetic nephropathy, without long-term current use of insulin ([place])   [facility] Juline Patch, MD   1 year ago Type 2 diabetes mellitus with diabetic nephropathy, without long-term current use of insulin ([place])   [facility] Juline Patch, MD   1 year ago Type 2 diabetes mellitus with diabetic nephropathy, without long-term current use of insulin ([place])   [facility] Juline Patch, MD   2 years ago Type 2 diabetes mellitus with diabetic nephropathy, without long-term current use of insulin ([place])   [facility] Juline Patch, MD                lovastatin (MEVACOR) 20 MG tablet [Pharmacy Med Name: LOVASTATIN TAB 20MG ] 90 tablet     Sig: TAKE 1 TABLET DAILY      Cardiovascular:  Antilipid - Statins Failed - 02/17/2020  3:46 PM      Failed - LDL in normal range and within 360 days    LDL Chol Calc (NIH)  Date Value Ref Range Status  12/07/2019 57 0 - 99 mg/dL Final          Failed - HDL in normal range and within 360 days    HDL  Date Value Ref Range Status  12/07/2019 35 (L) >39 mg/dL Final          Passed - Total Cholesterol in normal range and within 360 days    Cholesterol, Total  Date Value Ref Range Status  12/07/2019 106 100 - 199 mg/dL Final          Passed - Triglycerides in normal range and within 360 days    Triglycerides  Date Value Ref Range Status  12/07/2019 63 0 - 149 mg/dL Final          Passed - Patient is not pregnant      Passed - Valid encounter within last 12 months    Recent Outpatient Visits           2 months ago Hyperuricemia   [facility] Juline Patch, MD   8 months ago Type 2 diabetes mellitus with diabetic nephropathy, without long-term current use of insulin ([place])   [facility] Juline Patch, MD   1 year ago Type 2 diabetes mellitus with diabetic nephropathy, without long-term current use of insulin [facility])   [facility] Juline Patch, MD  1 year ago Type 2 diabetes mellitus with diabetic nephropathy, without long-term current use of insulin ([place])   [facility] Juline Patch, MD   2 years ago Type 2 diabetes mellitus with diabetic nephropathy, without long-term current use of  insulin ([place])   [facility] Juline Patch, MD                liraglutide (VICTOZA) 51 MG/3ML SOPN [Pharmacy Med Name: VICTOZA 3'S  PEN 18MG /3ML] 27 mL     Sig: INJECT 1.8MG  [place]      Endocrinology:  Diabetes - GLP-1 Receptor Agonists Passed - 02/17/2020  3:46 PM      Passed - HBA1C is between 0 and 7.9 and within 180 days    Hgb A1c MFr Bld  Date Value Ref Range Status  12/07/2019 6.0 (H) 4.8 - 5.6 % Final    Comment:             Prediabetes: 5.7 - 6.4          Diabetes: >6.4          Glycemic control for adults with diabetes: <7.0           Passed - Valid encounter within last 6 months    Recent Outpatient Visits           2 months ago Hyperuricemia   [facility] Juline Patch, MD   8 months ago Type 2 diabetes mellitus with diabetic nephropathy, without long-term current use of insulin ([place])   [facility] Juline Patch, MD   1 year ago Type 2 diabetes mellitus with diabetic nephropathy, without long-term current use of insulin ([place])   [facility] Juline Patch, MD   1 year ago Type 2 diabetes mellitus with diabetic nephropathy, without long-term current use of insulin ([place])   [facility] Juline Patch, MD   2 years ago Type 2 diabetes mellitus with diabetic nephropathy, without long-term current use of insulin [facility])   [facility] Juline Patch, MD

## 2020-02-20 DIAGNOSIS — Z6834 Body mass index (BMI) 34.0-34.9, adult: Secondary | ICD-10-CM | POA: Diagnosis not present

## 2020-02-20 DIAGNOSIS — R252 Cramp and spasm: Secondary | ICD-10-CM | POA: Diagnosis not present

## 2020-02-20 DIAGNOSIS — N185 Chronic kidney disease, stage 5: Secondary | ICD-10-CM | POA: Diagnosis not present

## 2020-02-21 ENCOUNTER — Other Ambulatory Visit: Payer: Self-pay

## 2020-02-21 ENCOUNTER — Other Ambulatory Visit
Admission: RE | Admit: 2020-02-21 | Discharge: 2020-02-21 | Disposition: A | Discharge status: Home / Self Care | Payer: Medicare Other | Source: Ambulatory Visit | Attending: Vascular Surgery | Admitting: Vascular Surgery

## 2020-02-21 DIAGNOSIS — Z20822 Contact with and (suspected) exposure to covid-19: Secondary | ICD-10-CM | POA: Insufficient documentation

## 2020-02-21 DIAGNOSIS — Z01812 Encounter for preprocedural laboratory examination: Secondary | ICD-10-CM | POA: Diagnosis not present

## 2020-02-21 LAB — PROTIME-INR
INR: 2 — ABNORMAL HIGH (ref 0.8–1.2)
Prothrombin Time: 21.9 seconds — ABNORMAL HIGH (ref 11.4–15.2)

## 2020-02-21 LAB — APTT: aPTT: 33 seconds (ref 24–36)

## 2020-02-21 LAB — SARS CORONAVIRUS 2 (TAT 6-24 HRS): SARS Coronavirus 2: NEGATIVE

## 2020-02-23 ENCOUNTER — Encounter
Admission: RE | Disposition: A | Discharge status: Home / Self Care | Payer: Self-pay | Source: Ambulatory Visit | Attending: Vascular Surgery

## 2020-02-23 ENCOUNTER — Ambulatory Visit
Admission: RE | Admit: 2020-02-23 | Discharge: 2020-02-23 | Disposition: A | Discharge status: Home / Self Care | Payer: Medicare Other | Source: Ambulatory Visit | Attending: Vascular Surgery | Admitting: Vascular Surgery

## 2020-02-23 ENCOUNTER — Ambulatory Visit: Payer: Medicare Other | Admitting: Registered Nurse

## 2020-02-23 ENCOUNTER — Encounter: Payer: Self-pay | Admitting: Vascular Surgery

## 2020-02-23 ENCOUNTER — Other Ambulatory Visit: Payer: Self-pay

## 2020-02-23 ENCOUNTER — Ambulatory Visit: Payer: Medicare Other | Admitting: Urgent Care

## 2020-02-23 DIAGNOSIS — N185 Chronic kidney disease, stage 5: Secondary | ICD-10-CM | POA: Insufficient documentation

## 2020-02-23 DIAGNOSIS — Z881 Allergy status to other antibiotic agents status: Secondary | ICD-10-CM | POA: Insufficient documentation

## 2020-02-23 DIAGNOSIS — E1122 Type 2 diabetes mellitus with diabetic chronic kidney disease: Secondary | ICD-10-CM | POA: Diagnosis not present

## 2020-02-23 DIAGNOSIS — E1140 Type 2 diabetes mellitus with diabetic neuropathy, unspecified: Secondary | ICD-10-CM | POA: Insufficient documentation

## 2020-02-23 DIAGNOSIS — E782 Mixed hyperlipidemia: Secondary | ICD-10-CM | POA: Diagnosis not present

## 2020-02-23 DIAGNOSIS — Z7901 Long term (current) use of anticoagulants: Secondary | ICD-10-CM | POA: Insufficient documentation

## 2020-02-23 DIAGNOSIS — N186 End stage renal disease: Secondary | ICD-10-CM | POA: Diagnosis not present

## 2020-02-23 DIAGNOSIS — I482 Chronic atrial fibrillation, unspecified: Secondary | ICD-10-CM | POA: Diagnosis not present

## 2020-02-23 DIAGNOSIS — Z833 Family history of diabetes mellitus: Secondary | ICD-10-CM | POA: Diagnosis not present

## 2020-02-23 DIAGNOSIS — K219 Gastro-esophageal reflux disease without esophagitis: Secondary | ICD-10-CM | POA: Diagnosis not present

## 2020-02-23 DIAGNOSIS — L988 Other specified disorders of the skin and subcutaneous tissue: Secondary | ICD-10-CM | POA: Insufficient documentation

## 2020-02-23 DIAGNOSIS — Z8249 Family history of ischemic heart disease and other diseases of the circulatory system: Secondary | ICD-10-CM | POA: Insufficient documentation

## 2020-02-23 DIAGNOSIS — Z794 Long term (current) use of insulin: Secondary | ICD-10-CM | POA: Diagnosis not present

## 2020-02-23 DIAGNOSIS — M109 Gout, unspecified: Secondary | ICD-10-CM | POA: Diagnosis not present

## 2020-02-23 DIAGNOSIS — Z79899 Other long term (current) drug therapy: Secondary | ICD-10-CM | POA: Insufficient documentation

## 2020-02-23 DIAGNOSIS — I120 Hypertensive chronic kidney disease with stage 5 chronic kidney disease or end stage renal disease: Secondary | ICD-10-CM | POA: Diagnosis not present

## 2020-02-23 HISTORY — PX: BASCILIC VEIN TRANSPOSITION: SHX5742

## 2020-02-23 LAB — PROTIME-INR
INR: 1.4 — ABNORMAL HIGH (ref 0.8–1.2)
Prothrombin Time: 16.7 seconds — ABNORMAL HIGH (ref 11.4–15.2)

## 2020-02-23 LAB — APTT: aPTT: 27 seconds (ref 24–36)

## 2020-02-23 LAB — GLUCOSE, CAPILLARY
Glucose-Capillary: 101 mg/dL — ABNORMAL HIGH (ref 70–99)
Glucose-Capillary: 108 mg/dL — ABNORMAL HIGH (ref 70–99)

## 2020-02-23 LAB — POTASSIUM: Potassium: 4.7 mmol/L (ref 3.5–5.1)

## 2020-02-23 LAB — ABO/RH: ABO/RH(D): A NEG

## 2020-02-23 SURGERY — TRANSPOSITION, VEIN, BASILIC
Anesthesia: General | Laterality: Left

## 2020-02-23 MED ORDER — SUGAMMADEX SODIUM 500 MG/5ML IV SOLN
INTRAVENOUS | Status: DC | PRN
  Administered 2020-02-23: 300 mg via INTRAVENOUS

## 2020-02-23 MED ORDER — SODIUM CHLORIDE 0.9 % IV SOLN
INTRAVENOUS | Status: DC | PRN
  Administered 2020-02-23: 500 mL via INTRAMUSCULAR

## 2020-02-23 MED ORDER — LIDOCAINE HCL (CARDIAC) PF 100 MG/5ML IV SOSY
PREFILLED_SYRINGE | INTRAVENOUS | Status: DC | PRN
  Administered 2020-02-23: 100 mg via INTRAVENOUS

## 2020-02-23 MED ORDER — GLYCOPYRROLATE 0.2 MG/ML IJ SOLN
INTRAMUSCULAR | Status: AC
  Filled 2020-02-23: qty 1

## 2020-02-23 MED ORDER — OXYCODONE-ACETAMINOPHEN 5-325 MG PO TABS: 1.0000 | ORAL_TABLET | Freq: Four times a day (QID) | ORAL | 0 refills | Status: DC | PRN

## 2020-02-23 MED ORDER — CEFAZOLIN SODIUM-DEXTROSE 1-4 GM/50ML-% IV SOLN
1.0000 g | INTRAVENOUS | Status: AC
  Administered 2020-02-23: 1 g via INTRAVENOUS

## 2020-02-23 MED ORDER — EPHEDRINE SULFATE 50 MG/ML IJ SOLN
INTRAMUSCULAR | Status: DC | PRN
  Administered 2020-02-23: 10 mg via INTRAVENOUS
  Administered 2020-02-23 (×3): 5 mg via INTRAVENOUS
  Administered 2020-02-23: 10 mg via INTRAVENOUS
  Administered 2020-02-23: 5 mg via INTRAVENOUS
  Administered 2020-02-23: 10 mg via INTRAVENOUS

## 2020-02-23 MED ORDER — ROCURONIUM BROMIDE 10 MG/ML (PF) SYRINGE
PREFILLED_SYRINGE | INTRAVENOUS | Status: AC
  Filled 2020-02-23: qty 10

## 2020-02-23 MED ORDER — CEFAZOLIN SODIUM-DEXTROSE 1-4 GM/50ML-% IV SOLN
INTRAVENOUS | Status: AC
  Filled 2020-02-23: qty 50

## 2020-02-23 MED ORDER — "VISTASEAL 4 ML SINGLE DOSE KIT "
PACK | CUTANEOUS | Status: DC | PRN
  Administered 2020-02-23: 4 mL via TOPICAL

## 2020-02-23 MED ORDER — SODIUM CHLORIDE 0.9 % IV SOLN: INTRAVENOUS | Status: DC

## 2020-02-23 MED ORDER — GLYCOPYRROLATE 0.2 MG/ML IJ SOLN
INTRAMUSCULAR | Status: DC | PRN
  Administered 2020-02-23: .2 mg via INTRAVENOUS

## 2020-02-23 MED ORDER — FENTANYL CITRATE (PF) 100 MCG/2ML IJ SOLN
INTRAMUSCULAR | Status: DC | PRN
  Administered 2020-02-23: 50 ug via INTRAVENOUS

## 2020-02-23 MED ORDER — HEPARIN SODIUM (PORCINE) 5000 UNIT/ML IJ SOLN
INTRAMUSCULAR | Status: AC
  Filled 2020-02-23: qty 1

## 2020-02-23 MED ORDER — BUPIVACAINE LIPOSOME 1.3 % IJ SUSP
INTRAMUSCULAR | Status: DC | PRN
  Administered 2020-02-23: 20 mL

## 2020-02-23 MED ORDER — EPHEDRINE 5 MG/ML INJ
INTRAVENOUS | Status: AC
  Filled 2020-02-23: qty 10

## 2020-02-23 MED ORDER — PROPOFOL 10 MG/ML IV BOLUS
INTRAVENOUS | Status: AC
  Filled 2020-02-23: qty 20

## 2020-02-23 MED ORDER — PHENYLEPHRINE HCL (PRESSORS) 10 MG/ML IV SOLN
INTRAVENOUS | Status: DC | PRN
  Administered 2020-02-23 (×2): 100 ug via INTRAVENOUS
  Administered 2020-02-23: 150 ug via INTRAVENOUS
  Administered 2020-02-23 (×5): 100 ug via INTRAVENOUS
  Administered 2020-02-23: 50 ug via INTRAVENOUS
  Administered 2020-02-23 (×2): 100 ug via INTRAVENOUS

## 2020-02-23 MED ORDER — BUPIVACAINE HCL (PF) 0.5 % IJ SOLN
INTRAMUSCULAR | Status: AC
  Filled 2020-02-23: qty 30

## 2020-02-23 MED ORDER — ORAL CARE MOUTH RINSE: 15.0000 mL | Freq: Once | OROMUCOSAL | Status: AC

## 2020-02-23 MED ORDER — FENTANYL CITRATE (PF) 100 MCG/2ML IJ SOLN
INTRAMUSCULAR | Status: AC
  Filled 2020-02-23: qty 2

## 2020-02-23 MED ORDER — CHLORHEXIDINE GLUCONATE 0.12 % MT SOLN: 15.0000 mL | Freq: Once | OROMUCOSAL | Status: AC

## 2020-02-23 MED ORDER — ONDANSETRON HCL 4 MG/2ML IJ SOLN
INTRAMUSCULAR | Status: DC | PRN
  Administered 2020-02-23: 4 mg via INTRAVENOUS

## 2020-02-23 MED ORDER — DEXMEDETOMIDINE HCL IN NACL 80 MCG/20ML IV SOLN
INTRAVENOUS | Status: AC
  Filled 2020-02-23: qty 20

## 2020-02-23 MED ORDER — ACETAMINOPHEN 10 MG/ML IV SOLN
INTRAVENOUS | Status: DC | PRN
  Administered 2020-02-23: 1000 mg via INTRAVENOUS

## 2020-02-23 MED ORDER — SUGAMMADEX SODIUM 500 MG/5ML IV SOLN
INTRAVENOUS | Status: AC
  Filled 2020-02-23: qty 5

## 2020-02-23 MED ORDER — KETAMINE HCL 10 MG/ML IJ SOLN
INTRAMUSCULAR | Status: DC | PRN
  Administered 2020-02-23: 10 mg via INTRAVENOUS
  Administered 2020-02-23: 30 mg via INTRAVENOUS

## 2020-02-23 MED ORDER — CHLORHEXIDINE GLUCONATE CLOTH 2 % EX PADS: 6.0000 | MEDICATED_PAD | Freq: Once | CUTANEOUS | Status: DC

## 2020-02-23 MED ORDER — ONDANSETRON HCL 4 MG/2ML IJ SOLN
INTRAMUSCULAR | Status: AC
  Filled 2020-02-23: qty 2

## 2020-02-23 MED ORDER — DEXAMETHASONE SODIUM PHOSPHATE 10 MG/ML IJ SOLN
INTRAMUSCULAR | Status: DC | PRN
  Administered 2020-02-23: 4 mg via INTRAVENOUS

## 2020-02-23 MED ORDER — ROCURONIUM BROMIDE 100 MG/10ML IV SOLN
INTRAVENOUS | Status: DC | PRN
  Administered 2020-02-23: 10 mg via INTRAVENOUS
  Administered 2020-02-23: 50 mg via INTRAVENOUS
  Administered 2020-02-23: 20 mg via INTRAVENOUS

## 2020-02-23 MED ORDER — SODIUM CHLORIDE (PF) 0.9 % IJ SOLN
INTRAMUSCULAR | Status: AC
  Filled 2020-02-23: qty 10

## 2020-02-23 MED ORDER — LIDOCAINE HCL (PF) 2 % IJ SOLN
INTRAMUSCULAR | Status: AC
  Filled 2020-02-23: qty 5

## 2020-02-23 MED ORDER — DEXMEDETOMIDINE HCL 200 MCG/2ML IV SOLN
INTRAVENOUS | Status: DC | PRN
  Administered 2020-02-23: 12 ug via INTRAVENOUS
  Administered 2020-02-23: 8 ug via INTRAVENOUS

## 2020-02-23 MED ORDER — DEXAMETHASONE SODIUM PHOSPHATE 10 MG/ML IJ SOLN
INTRAMUSCULAR | Status: AC
  Filled 2020-02-23: qty 1

## 2020-02-23 MED ORDER — BUPIVACAINE LIPOSOME 1.3 % IJ SUSP
INTRAMUSCULAR | Status: AC
  Filled 2020-02-23: qty 20

## 2020-02-23 MED ORDER — ACETAMINOPHEN 10 MG/ML IV SOLN
INTRAVENOUS | Status: AC
  Filled 2020-02-23: qty 100

## 2020-02-23 MED ORDER — KETAMINE HCL 50 MG/ML IJ SOLN
INTRAMUSCULAR | Status: AC
  Filled 2020-02-23: qty 10

## 2020-02-23 MED ORDER — PROPOFOL 10 MG/ML IV BOLUS
INTRAVENOUS | Status: DC | PRN
  Administered 2020-02-23: 120 mg via INTRAVENOUS

## 2020-02-23 MED ORDER — HEMOSTATIC AGENTS (NO CHARGE) OPTIME
TOPICAL | Status: DC | PRN
  Administered 2020-02-23: 1 via TOPICAL

## 2020-02-23 MED ORDER — CHLORHEXIDINE GLUCONATE 0.12 % MT SOLN
OROMUCOSAL | Status: AC
  Administered 2020-02-23: 15 mL via OROMUCOSAL
  Filled 2020-02-23: qty 15

## 2020-02-23 MED ORDER — BUPIVACAINE HCL (PF) 0.5 % IJ SOLN
INTRAMUSCULAR | Status: DC | PRN
  Administered 2020-02-23: 30 mL

## 2020-02-23 SURGICAL SUPPLY — 66 items
ADH SKN CLS APL DERMABOND .7 (GAUZE/BANDAGES/DRESSINGS) ×2
APL PRP STRL LF DISP 70% ISPRP (MISCELLANEOUS) ×1
APPLIER CLIP 11 MED OPEN (CLIP)
APPLIER CLIP 9.375 SM OPEN (CLIP)
APR CLP MED 11 20 MLT OPN (CLIP)
APR CLP SM 9.3 20 MLT OPN (CLIP)
BAG DECANTER FOR FLEXI CONT (MISCELLANEOUS) ×3 IMPLANT
BASIN GRAD PLASTIC 32OZ STRL (MISCELLANEOUS) ×2 IMPLANT
BLADE SURG 15 STRL LF DISP TIS (BLADE) ×1 IMPLANT
BLADE SURG 15 STRL SS (BLADE) ×3
BLADE SURG SZ11 CARB STEEL (BLADE) ×3 IMPLANT
BOOT SUTURE AID YELLOW STND (SUTURE) ×3 IMPLANT
BRUSH SCRUB EZ  4% CHG (MISCELLANEOUS) ×2
BRUSH SCRUB EZ 4% CHG (MISCELLANEOUS) ×1 IMPLANT
CANISTER SUCT 1200ML W/VALVE (MISCELLANEOUS) ×3 IMPLANT
CHLORAPREP W/TINT 26 (MISCELLANEOUS) ×3 IMPLANT
CLIP APPLIE 11 MED OPEN (CLIP) IMPLANT
CLIP APPLIE 9.375 SM OPEN (CLIP) IMPLANT
COVER PROBE FLX POLY STRL (MISCELLANEOUS) ×2 IMPLANT
COVER WAND RF STERILE (DRAPES) ×3 IMPLANT
DECANTER SPIKE VIAL GLASS SM (MISCELLANEOUS) ×3 IMPLANT
DERMABOND ADVANCED (GAUZE/BANDAGES/DRESSINGS) ×4
DERMABOND ADVANCED .7 DNX12 (GAUZE/BANDAGES/DRESSINGS) ×1 IMPLANT
DRESSING SURGICEL FIBRLLR 1X2 (HEMOSTASIS) ×1 IMPLANT
DRSG SURGICEL FIBRILLAR 1X2 (HEMOSTASIS) ×3
ELECT CAUTERY BLADE 6.4 (BLADE) ×3 IMPLANT
ELECT REM PT RETURN 9FT ADLT (ELECTROSURGICAL) ×3
ELECTRODE REM PT RTRN 9FT ADLT (ELECTROSURGICAL) ×1 IMPLANT
GEL ULTRASOUND 20GR AQUASONIC (MISCELLANEOUS) ×3 IMPLANT
GLOVE BIO SURGEON STRL SZ7 (GLOVE) ×3 IMPLANT
GLOVE INDICATOR 7.5 STRL GRN (GLOVE) ×3 IMPLANT
GLOVE SURG SYN 8.0 (GLOVE) ×3 IMPLANT
GLOVE SURG SYN 8.0 PF PI (GLOVE) ×1 IMPLANT
GOWN L4 XLG 20 PK N/S (GOWN DISPOSABLE) ×3 IMPLANT
GOWN STRL REUS W/ TWL LRG LVL3 (GOWN DISPOSABLE) ×2 IMPLANT
GOWN STRL REUS W/TWL LRG LVL3 (GOWN DISPOSABLE) ×6
IV NS 500ML (IV SOLUTION) ×3
IV NS 500ML BAXH (IV SOLUTION) ×1 IMPLANT
KIT TURNOVER KIT A (KITS) ×3 IMPLANT
LABEL OR SOLS (LABEL) ×3 IMPLANT
LOOP RED MAXI  1X406MM (MISCELLANEOUS) ×4
LOOP VESSEL MAXI 1X406 RED (MISCELLANEOUS) ×1 IMPLANT
LOOP VESSEL MINI 0.8X406 BLUE (MISCELLANEOUS) ×2 IMPLANT
LOOPS BLUE MINI 0.8X406MM (MISCELLANEOUS) ×2
NDL FILTER BLUNT 18X1 1/2 (NEEDLE) ×1 IMPLANT
NEEDLE FILTER BLUNT 18X 1/2SAF (NEEDLE) ×2
NEEDLE FILTER BLUNT 18X1 1/2 (NEEDLE) ×1 IMPLANT
PACK EXTREMITY (MISCELLANEOUS) ×3 IMPLANT
PAD PREP 24X41 OB/GYN DISP (PERSONAL CARE ITEMS) ×3 IMPLANT
STOCKINETTE 48X4 2 PLY STRL (GAUZE/BANDAGES/DRESSINGS) ×1 IMPLANT
STOCKINETTE ORTHO 4X25 (MISCELLANEOUS) ×3 IMPLANT
STOCKINETTE STRL 4IN 9604848 (GAUZE/BANDAGES/DRESSINGS) IMPLANT
SUT MNCRL+ 5-0 UNDYED PC-3 (SUTURE) ×2 IMPLANT
SUT MONOCRYL 5-0 (SUTURE) ×4
SUT PROLENE 6 0 BV (SUTURE) ×10 IMPLANT
SUT SILK 2 0 (SUTURE) ×3
SUT SILK 2 0 SH (SUTURE) ×3 IMPLANT
SUT SILK 2-0 18XBRD TIE 12 (SUTURE) ×1 IMPLANT
SUT SILK 3 0 (SUTURE) ×6
SUT SILK 3-0 18XBRD TIE 12 (SUTURE) ×1 IMPLANT
SUT SILK 4 0 (SUTURE) ×3
SUT SILK 4-0 18XBRD TIE 12 (SUTURE) ×1 IMPLANT
SUT VIC AB 3-0 SH 27 (SUTURE) ×12
SUT VIC AB 3-0 SH 27X BRD (SUTURE) ×3 IMPLANT
SYR 20ML LL LF (SYRINGE) ×3 IMPLANT
SYR 3ML LL SCALE MARK (SYRINGE) ×3 IMPLANT

## 2020-02-23 NOTE — Interval H&P Note (Signed)
History and Physical Interval Note:  02/23/2020 7:15 AM  Mark Bauer  has presented today for surgery, with the diagnosis of ESRD.  The various methods of treatment have been discussed with the patient and family. After consideration of risks, benefits and other options for treatment, the patient has consented to  Procedure(s): [place] (SINGLE STAGE) (Left) as a surgical intervention.  The patient's history has been reviewed, patient examined, no change in status, stable for surgery.  I have reviewed the patient's chart and labs.  Questions were answered to the patient's satisfaction.     Hortencia Pilar

## 2020-02-23 NOTE — Anesthesia Preprocedure Evaluation (Signed)
Anesthesia Evaluation  Patient identified by MRN, date of birth, ID band Patient awake    Reviewed: Allergy & Precautions, NPO status , Patient's Chart, lab work & pertinent test results  History of Anesthesia Complications Negative for: history of anesthetic complications  Airway Mallampati: II  TM Distance: >3 FB Neck ROM: Full    Dental  (+) Implants   Pulmonary neg sleep apnea, neg COPD,    breath sounds clear to auscultation- rhonchi (-) wheezing      Cardiovascular hypertension, Pt. on medications (-) CAD, (-) Past MI, (-) Cardiac Stents and (-) CABG + dysrhythmias Atrial Fibrillation  Rhythm:Regular Rate:Normal - Systolic murmurs and - Diastolic murmurs    Neuro/Psych neg Seizures negative neurological ROS  negative psych ROS   GI/Hepatic GERD  ,Polycystic liver disease    Endo/Other  diabetes, Oral Hypoglycemic Agents  Renal/GU CRFRenal disease     Musculoskeletal negative musculoskeletal ROS (+)   Abdominal (+) + obese,   Peds  Hematology negative hematology ROS (+)   Anesthesia Other Findings Past Medical History: No date: Allergy No date: Bilateral undescended testicles No date: Cancer [facility])     Comment:  melanoma on forehead No date: Diabetes mellitus without complication (HCC) No date: Dysrhythmia     Comment:  a fib No date: GERD (gastroesophageal reflux disease) No date: Gout No date: Hepatitis     Comment:  polycystic  No date: Hypertension No date: Hypokalemia No date: Mixed hyperlipidemia No date: Polycystic kidney No date: Vitamin D deficiency   Reproductive/Obstetrics                             Anesthesia Physical Anesthesia Plan  ASA: III  Anesthesia Plan: General   Post-op Pain Management:    Induction: Intravenous  PONV Risk Score and Plan: 1 and Ondansetron, Dexamethasone and Midazolam  Airway Management Planned: Oral ETT  Additional  Equipment:   Intra-op Plan:   Post-operative Plan: Extubation in OR  Informed Consent: I have reviewed the patients History and Physical, chart, labs and discussed the procedure including the risks, benefits and alternatives for the proposed anesthesia with the patient or authorized representative who has indicated his/her understanding and acceptance.     Dental advisory given  Plan Discussed with: CRNA and Anesthesiologist  Anesthesia Plan Comments:         Anesthesia Quick Evaluation

## 2020-02-23 NOTE — Discharge Instructions (Addendum)
Bupivacaine Liposomal Suspension for Injection °What is this medicine? °BUPIVACAINE LIPOSOMAL (bue PIV a kane LIP oh som al) is an anesthetic. It causes loss of feeling in the skin or other tissues. It is used to prevent and to treat pain from some procedures. °This medicine may be used for other purposes; ask your health care provider or pharmacist if you have questions. °COMMON BRAND NAME(S): EXPAREL °What should I tell my health care provider before I take this medicine? °They need to know if you have any of these conditions: °· G6PD deficiency °· heart disease °· kidney disease °· liver disease °· low blood pressure °· lung or breathing disease, like asthma °· an unusual or allergic reaction to bupivacaine, other medicines, foods, dyes, or preservatives °· pregnant or trying to get pregnant °· breast-feeding °How should I use this medicine? °This medicine is for injection into the affected area. It is given by a health care professional in a hospital or clinic setting. °Talk to your pediatrician regarding the use of this medicine in children. Special care may be needed. °Overdosage: If you think you have taken too much of this medicine contact a poison control center or emergency room at once. °NOTE: This medicine is only for you. Do not share this medicine with others. °What if I miss a dose? °This does not apply. °What may interact with this medicine? °This medicine may interact with the following medications: °· acetaminophen °· certain antibiotics like dapsone, nitrofurantoin, aminosalicylic acid, sulfonamides °· certain medicines for seizures like phenobarbital, phenytoin, valproic acid °· chloroquine °· cyclophosphamide °· flutamide °· hydroxyurea °· ifosfamide °· metoclopramide °· nitric oxide °· nitroglycerin °· nitroprusside °· nitrous oxide °· other local anesthetics like lidocaine, pramoxine, tetracaine °· primaquine °· quinine °· rasburicase °· sulfasalazine °This list may not describe all possible  interactions. Give your health care provider a list of all the medicines, herbs, non-prescription drugs, or dietary supplements you use. Also tell them if you smoke, drink alcohol, or use illegal drugs. Some items may interact with your medicine. °What should I watch for while using this medicine? °Your condition will be monitored carefully while you are receiving this medicine. °Be careful to avoid injury while the area is numb, and you are not aware of pain. °What side effects may I notice from receiving this medicine? °Side effects that you should report to your doctor or health care professional as soon as possible: °· allergic reactions like skin rash, itching or hives, swelling of the face, lips, or tongue °· seizures °· signs and symptoms of a dangerous change in heartbeat or heart rhythm like chest pain; dizziness; fast, irregular heartbeat; palpitations; feeling faint or lightheaded; falls; breathing problems °· signs and symptoms of methemoglobinemia such as pale, gray, or blue colored skin; headache; fast heartbeat; shortness of breath; feeling faint or lightheaded, falls; tiredness °Side effects that usually do not require medical attention (report to your doctor or health care professional if they continue or are bothersome): °· anxious °· back pain °· changes in taste °· changes in vision °· constipation °· dizziness °· fever °· nausea, vomiting °This list may not describe all possible side effects. Call your doctor for medical advice about side effects. You may report side effects to FDA at 1-800-FDA-1088. °Where should I keep my medicine? °This drug is given in a hospital or clinic and will not be stored at home. °NOTE: This sheet is a summary. It may not cover all possible information. If you have questions about this   medicine, talk to your doctor, pharmacist, or health care provider. °© 2020 [employer] (2019-04-18 10:48:23) ° ° °AMBULATORY SURGERY  °DISCHARGE INSTRUCTIONS ° ° °1) The  drugs that you were given will stay in your system until tomorrow so for the next 24 hours you should not: ° °A) Drive an automobile °B) Make any legal decisions °C) Drink any alcoholic beverage ° ° °2) You may resume regular meals tomorrow.  Today it is better to start with liquids and gradually work up to solid foods. ° °You may eat anything you prefer, but it is better to start with liquids, then soup and crackers, and gradually work up to solid foods. ° ° °3) Please notify your doctor immediately if you have any unusual bleeding, trouble breathing, redness and pain at the surgery site, drainage, fever, or pain not relieved by medication. ° ° ° °4) Additional Instructions: ° ° ° ° ° ° ° °Please contact your physician with any problems or Same Day Surgery at [phone], Monday through Friday 6 am to 4 pm, or [place] [facility] [place] Main number at [phone]. °

## 2020-02-23 NOTE — Anesthesia Postprocedure Evaluation (Signed)
Anesthesia Post Note  Patient: Mark Bauer  Procedure(s) Performed: [place] (SINGLE STAGE) (Left )  Patient location during evaluation: PACU Anesthesia Type: General Level of consciousness: awake and alert and oriented Pain management: pain level controlled Vital Signs Assessment: post-procedure vital signs reviewed and stable Respiratory status: spontaneous breathing, nonlabored ventilation and respiratory function stable Cardiovascular status: blood pressure returned to baseline and stable Postop Assessment: no signs of nausea or vomiting Anesthetic complications: no   No complications documented.   Last Vitals:  Vitals:   02/23/20 1153 02/23/20 1219  BP: 125/70 120/61  Pulse: 72 79  Resp: 18 16  Temp: (!) 36 C (!) 36.3 C  SpO2: 95% 99%    Last Pain:  Vitals:   02/23/20 1219  TempSrc: Temporal  PainSc: 1                  Fidela Cieslak

## 2020-02-23 NOTE — Transfer of Care (Signed)
Immediate Anesthesia Transfer of Care Note  Patient: ELIAZ FOUT  Procedure(s) Performed: [place] (SINGLE STAGE) (Left )  Patient Location: PACU  Anesthesia Type:General  Level of Consciousness: drowsy  Airway & Oxygen Therapy: Patient Spontanous Breathing and Patient connected to face mask oxygen  Post-op Assessment: Report given to RN and Post -op Vital signs reviewed and stable  Post vital signs: Reviewed and stable  Last Vitals:  Vitals Value Taken Time  BP 120/81 02/23/20 1044  Temp 35.8 C 02/23/20 1044  Pulse 53 02/23/20 1049  Resp 17 02/23/20 1049  SpO2 99 % 02/23/20 1049  Vitals shown include unvalidated device data.  Last Pain:  Vitals:   02/23/20 1044  TempSrc:   PainSc: Asleep         Complications: No complications documented.

## 2020-02-23 NOTE — Anesthesia Procedure Notes (Signed)
Procedure Name: Intubation Date/Time: 02/23/2020 7:58 AM Performed by: Lia Foyer, CRNA Pre-anesthesia Checklist: Patient identified, Emergency Drugs available, Suction available and Patient being monitored Patient Re-evaluated:Patient Re-evaluated prior to induction Oxygen Delivery Method: Circle system utilized Preoxygenation: Pre-oxygenation with 100% oxygen Induction Type: IV induction Ventilation: Mask ventilation without difficulty Laryngoscope Size: McGraph and 4 Grade View: Grade I Tube type: Oral Tube size: 7.5 mm Number of attempts: 1 Airway Equipment and Method: Stylet,  Oral airway and Video-laryngoscopy Placement Confirmation: ETT inserted through vocal cords under direct vision,  positive ETCO2 and breath sounds checked- equal and bilateral Secured at: 22 cm Tube secured with: Tape Dental Injury: Teeth and Oropharynx as per pre-operative assessment

## 2020-02-23 NOTE — Op Note (Signed)
OPERATIVE NOTE   PROCEDURE: Left basilic vein transposition (brachiobasilic arteriovenous fistula) creation   PRE-OPERATIVE DIAGNOSIS: Stage V renal insufficiency  POST-OPERATIVE DIAGNOSIS: Same  SURGEON: Mark Bauer  ASSISTANT(S): Mark Bauer  ANESTHESIA: General  ESTIMATED BLOOD LOSS: Minimal cc  FINDING(S): Basilic vein of good caliber, sounded to 4.5 mm with Mark Bauer coronary dilators  SPECIMEN(S):  None  INDICATIONS:   Mark Bauer is a 64 y.o. male who presents with stage V renal insufficiency but he is not yet on dialysis. The patient is therefore undergoing creation of a fistula so that he will have adequate dialysis access in the future and avoid catheter placement.  The patient is aware the risks include but are not limited to: bleeding, infection, steal syndrome, nerve damage, failure to mature, and need for additional procedures.  The patient is aware of the risks of the procedure and elects to proceed forward.  DESCRIPTION: After full informed written consent was obtained from the patient, the patient was brought back to the operating room and placed supine upon the operating table.  Prior to induction, the patient received IV antibiotics.   After obtaining adequate anesthesia, the patient's left arm was then prepped and draped in the standard fashion. Appropriate timeout is called.  A first assistant was required to provide a safe and appropriate environment for executing the surgery.  The assistant was integral in providing retraction, exposure, running suture providing suction and in the closing process.  Preoperative vein mapping indicates a basilic vein which is 4 mm or larger throughout its course and therefore basilic transposition will be performed in a single stage. Attention was turned to first identifying the patient's basilic vein.  A longitudinal incision was made over the vein from the level of the antecubital fossa up to its axillary extent.  The  vein was dissected from the adjacent nerves.  The entirety of the basilic vein was mobilized and the vein was then marked with a surgical marker to maintain appropriate orientation. The vein was then transected near the antecubital fossa with 2-0 silk ties.  It was then irrigated with heparinized saline.   The vein was then approximated to the skin in order to plan for the position of the arterial anastomosis.  A small skin incision was created with a 15 blade scalpel at the apex of the vein. A small pocket was fashioned with a hemostat. The deep subcutaneous tissue was inspected for bleeding.  The basilic vein was then pulled subcutaneously using a Mark Bauer clamp. The vein was then irrigated with heparinized saline to ensure that it had not been twisted.  The brachial artery is then dissected and looped with Silastic Vessel loops. Brachial artery is then controlled with the vessel loops and arteriotomy is made with an 11 blade and extended with Potts scissors and 6-0 Prolene stay sutures were placed. End vein to side brachial artery anastomosis is then fashioned with running 6-0 Prolene. Flushing maneuvers performed and flow was established through the fistula. Excellent thrill is noted within the vein 2+ radial pulses maintained at the wrist.   Bleeding was controlled with electrocautery and placement of large pieces of Mark Bauer.  I washed out the surgical site after waiting a few minutes, and there was no further bleeding.  The fascia was reapproximated with interrupted stitches of 3-0 Vicryl to eliminate some of the deep space.  The superficial subcutaneous tissue was then reapproximated along the incision line with a running stitch of 3-0 Vicryl.  The skin was then  reapproximated with a running subcuticular of 4-0 Monocryl.  The skin was then cleaned, dried, and reinforced with Dermabond.    The patient tolerated this procedure well.   COMPLICATIONS: None  CONDITION: Mark Bauer [place]  [facility]  Office: [phone]  02/23/2020,10:32 AM

## 2020-02-24 ENCOUNTER — Encounter: Payer: Self-pay | Admitting: Vascular Surgery

## 2020-03-07 ENCOUNTER — Ambulatory Visit (INDEPENDENT_AMBULATORY_CARE_PROVIDER_SITE_OTHER): Payer: Medicare Other | Admitting: Vascular Surgery

## 2020-03-07 ENCOUNTER — Encounter (INDEPENDENT_AMBULATORY_CARE_PROVIDER_SITE_OTHER): Payer: Self-pay | Admitting: Vascular Surgery

## 2020-03-07 ENCOUNTER — Other Ambulatory Visit: Payer: Self-pay

## 2020-03-07 VITALS — BP 106/69 | HR 116 | Resp 18 | Ht 72.0 in | Wt 246.0 lb

## 2020-03-07 DIAGNOSIS — E1122 Type 2 diabetes mellitus with diabetic chronic kidney disease: Secondary | ICD-10-CM

## 2020-03-07 DIAGNOSIS — N184 Chronic kidney disease, stage 4 (severe): Secondary | ICD-10-CM

## 2020-03-07 NOTE — Progress Notes (Signed)
Patient ID: Mark Bauer, male   DOB: [birth year], 64 y.o.   MRN: [id number]  Chief Complaint  Patient presents with  . Follow-up    post op surgery    HPI [name] is a 64 y.o. male.    No arm or hand pain   Past Medical History:  Diagnosis Date  . Allergy   . Bilateral undescended testicles   . Cancer ([place])    melanoma on forehead  . Diabetes mellitus without complication ([place])   . Dysrhythmia    a fib  . GERD (gastroesophageal reflux disease)   . Gout   . Hepatitis    polycystic   . Hypertension   . Hypokalemia   . Mixed hyperlipidemia   . Polycystic kidney   . Vitamin D deficiency     Past Surgical History:  Procedure Laterality Date  . BASCILIC VEIN TRANSPOSITION Left 02/23/2020   Procedure: BASCILIC VEIN TRANSPOSITION (SINGLE STAGE);  Surgeon: Katha Cabal, MD;  Location: [facility] ORS;  Service: Vascular;  Laterality: Left;  . COLONOSCOPY  2012   hemorrhoids  . melanoma removal     forehead      Allergies  Allergen Reactions  . Ciprofloxacin Other (See Comments)    hallucination  . Pollen Extract Other (See Comments)    Sneezing, watery eyes, etc.  . Caffeine Palpitations    palpatations    Current Outpatient Medications  Medication Sig Dispense Refill  . allopurinol (ZYLOPRIM) 100 MG tablet Take 1 tablet (100 mg total) by mouth daily. (Patient taking differently: Take 100 mg by mouth at bedtime. ) 90 tablet 1  . amLODipine (NORVASC) 10 MG tablet Take 1 tablet (10 mg total) by mouth daily. 90 tablet 1  . b complex vitamins tablet Take 1 tablet by mouth daily.    . Cholecalciferol (D3 SUPER STRENGTH) 2000 units CAPS Take 2,000 Units by mouth every Monday, Tuesday, Wednesday, Thursday, and Friday.     . Insulin Pen Needle (NOVOFINE) 32G X 6 MM MISC USE AND DISCARD 1 PEN      NEEDLE SUBCUTANEOUSLY DAILYAS DIRECTED 100 each 0  . JANTOVEN 5 MG tablet TAKE 1 TABLET DAILY 90 tablet 0  . liraglutide (VICTOZA) 18 MG/3ML SOPN INJECT  SUBCUTANEOUSLY 1.8MG  ONCE A DAY (Patient taking differently: Inject 1.8 mg into the skin daily. ) 27 mL 1  . loratadine (CLARITIN) 10 MG tablet Take 10 mg by mouth at bedtime. otc    . lovastatin (MEVACOR) 20 MG tablet Take 1 tablet (20 mg total) by mouth daily. (Patient taking differently: Take 20 mg by mouth every evening. ) 90 tablet 1  . metoprolol succinate (TOPROL-XL) 100 MG 24 hr tablet Take 1 tablet (100 mg total) by mouth daily. Take with or immediately following a meal. 90 tablet 1  . pantoprazole (PROTONIX) 40 MG tablet Take 1 tablet (40 mg total) by mouth daily. 90 tablet 1  . spironolactone (ALDACTONE) 25 MG tablet Take 25 mg by mouth daily.    Marland Kitchen torsemide (DEMADEX) 10 MG tablet Take 10 mg by mouth daily as needed (ankle swelling/fluid retention.).     Marland Kitchen warfarin (COUMADIN) 4 MG tablet Take 4 mg by mouth See admin instructions. On Tuesdays & Thursdays in the evening    . oxyCODONE-acetaminophen (PERCOCET) 5-325 MG tablet Take 1-2 tablets by mouth every 6 (six) hours as needed for moderate pain or severe pain. (Patient not taking: Reported on 03/07/2020) 40 tablet 0   No current facility-administered  medications for this visit.        Physical Exam BP 106/69 (BP Location: Right Arm)   Pulse (!) 116   Resp 18   Ht 6' (1.829 m)   Wt 246 lb (111.6 kg)   BMI 33.36 kg/m  Gen:  WD/WN, NAD Skin: incision C/D/I; good thrill good bruit     Assessment/Plan:  1. CKD stage 4 due to type 2 diabetes mellitus ([place]) Recommend:  The patient is doing well and currently has adequate dialysis access.  The patient should have a duplex ultrasound of the dialysis access in 3 months. The patient will follow-up with me in the office after each ultrasound    - VAS [place] (AVF, AVG); Future      Hortencia Pilar 03/07/2020, 12:07 PM   This note was created with Dragon medical transcription system.  Any errors from dictation are unintentional.

## 2020-03-13 ENCOUNTER — Ambulatory Visit: Payer: Medicare Other | Admitting: Urology

## 2020-03-15 ENCOUNTER — Other Ambulatory Visit: Payer: Self-pay

## 2020-03-15 LAB — HM DIABETES EYE EXAM

## 2020-03-18 ENCOUNTER — Ambulatory Visit: Payer: Medicare Other

## 2020-03-18 ENCOUNTER — Other Ambulatory Visit: Payer: Self-pay

## 2020-03-18 DIAGNOSIS — Q613 Polycystic kidney, unspecified: Secondary | ICD-10-CM | POA: Diagnosis not present

## 2020-03-18 DIAGNOSIS — N184 Chronic kidney disease, stage 4 (severe): Secondary | ICD-10-CM | POA: Diagnosis not present

## 2020-03-18 DIAGNOSIS — Z7901 Long term (current) use of anticoagulants: Secondary | ICD-10-CM

## 2020-03-18 DIAGNOSIS — N2581 Secondary hyperparathyroidism of renal origin: Secondary | ICD-10-CM | POA: Diagnosis not present

## 2020-03-18 DIAGNOSIS — E872 Acidosis: Secondary | ICD-10-CM | POA: Diagnosis not present

## 2020-03-18 DIAGNOSIS — I10 Essential (primary) hypertension: Secondary | ICD-10-CM | POA: Diagnosis not present

## 2020-03-19 LAB — PROTIME-INR
INR: 3 — ABNORMAL HIGH (ref 0.9–1.2)
Prothrombin Time: 30.6 s — ABNORMAL HIGH (ref 9.1–12.0)

## 2020-03-21 ENCOUNTER — Other Ambulatory Visit: Payer: Self-pay

## 2020-04-02 ENCOUNTER — Other Ambulatory Visit: Payer: Self-pay

## 2020-04-02 ENCOUNTER — Telehealth: Payer: Self-pay | Admitting: Family Medicine

## 2020-04-02 ENCOUNTER — Other Ambulatory Visit: Payer: Medicare Other

## 2020-04-02 DIAGNOSIS — Z7901 Long term (current) use of anticoagulants: Secondary | ICD-10-CM | POA: Diagnosis not present

## 2020-04-02 NOTE — Telephone Encounter (Signed)
done

## 2020-04-02 NOTE — Telephone Encounter (Signed)
Copied from [place] [id number]. Topic: Quick Communication - See Telephone Encounter >> Apr 02, 2020  8:09 AM Loma [place] wrote: CRM for notification. See Telephone encounter for: 04/02/20.Reason for CRM: Pt called and is requesting to have a paper copy of an INR PT test to take to [facility]. Will be by later this am

## 2020-04-03 LAB — PROTIME-INR
INR: 2.6 — ABNORMAL HIGH (ref 0.9–1.2)
Prothrombin Time: 26.4 s — ABNORMAL HIGH (ref 9.1–12.0)

## 2020-04-29 DIAGNOSIS — Z23 Encounter for immunization: Secondary | ICD-10-CM | POA: Diagnosis not present

## 2020-05-02 ENCOUNTER — Ambulatory Visit (INDEPENDENT_AMBULATORY_CARE_PROVIDER_SITE_OTHER): Payer: Medicare Other | Admitting: Family Medicine

## 2020-05-02 ENCOUNTER — Encounter: Payer: Self-pay | Admitting: Family Medicine

## 2020-05-02 ENCOUNTER — Other Ambulatory Visit: Payer: Self-pay | Admitting: Family Medicine

## 2020-05-02 ENCOUNTER — Other Ambulatory Visit: Payer: Self-pay

## 2020-05-02 VITALS — BP 120/62 | HR 72 | Ht 72.0 in | Wt 257.0 lb

## 2020-05-02 DIAGNOSIS — I482 Chronic atrial fibrillation, unspecified: Secondary | ICD-10-CM

## 2020-05-02 DIAGNOSIS — E782 Mixed hyperlipidemia: Secondary | ICD-10-CM | POA: Diagnosis not present

## 2020-05-02 DIAGNOSIS — E790 Hyperuricemia without signs of inflammatory arthritis and tophaceous disease: Secondary | ICD-10-CM

## 2020-05-02 DIAGNOSIS — K219 Gastro-esophageal reflux disease without esophagitis: Secondary | ICD-10-CM | POA: Diagnosis not present

## 2020-05-02 DIAGNOSIS — I10 Essential (primary) hypertension: Secondary | ICD-10-CM

## 2020-05-02 DIAGNOSIS — E1121 Type 2 diabetes mellitus with diabetic nephropathy: Secondary | ICD-10-CM

## 2020-05-02 DIAGNOSIS — D6859 Other primary thrombophilia: Secondary | ICD-10-CM | POA: Diagnosis not present

## 2020-05-02 MED ORDER — METOPROLOL SUCCINATE ER 100 MG PO TB24: 100.0000 mg | ORAL_TABLET | Freq: Every day | ORAL | 1 refills | Status: DC

## 2020-05-02 MED ORDER — LOVASTATIN 20 MG PO TABS: 20.0000 mg | ORAL_TABLET | Freq: Every day | ORAL | 1 refills | Status: DC

## 2020-05-02 MED ORDER — ALLOPURINOL 100 MG PO TABS: 100.0000 mg | ORAL_TABLET | Freq: Every day | ORAL | 1 refills | Status: DC

## 2020-05-02 MED ORDER — WARFARIN SODIUM 5 MG PO TABS: 5.0000 mg | ORAL_TABLET | Freq: Every day | ORAL | 1 refills | Status: DC

## 2020-05-02 MED ORDER — PANTOPRAZOLE SODIUM 40 MG PO TBEC: 40.0000 mg | DELAYED_RELEASE_TABLET | Freq: Every day | ORAL | 1 refills | Status: DC

## 2020-05-02 MED ORDER — VICTOZA 18 MG/3ML ~~LOC~~ SOPN: PEN_INJECTOR | SUBCUTANEOUS | 1 refills | Status: DC

## 2020-05-02 MED ORDER — WARFARIN SODIUM 4 MG PO TABS: 4.0000 mg | ORAL_TABLET | ORAL | 1 refills | Status: DC

## 2020-05-02 MED ORDER — AMLODIPINE BESYLATE 10 MG PO TABS: 10.0000 mg | ORAL_TABLET | Freq: Every day | ORAL | 1 refills | Status: DC

## 2020-05-02 NOTE — Progress Notes (Addendum)
Date:  05/02/2020   Name:  Mark Bauer   DOB:  06-04-1956   MRN:  [id number]   Chief Complaint: Diabetes (needs foot exam. 118 for BS), Hypertension, Hyperlipidemia, Gout, Atrial Fibrillation, and Gastroesophageal Reflux  Diabetes He presents for his follow-up diabetic visit. He has type 2 diabetes mellitus. His disease course has been stable. There are no hypoglycemic associated symptoms. Pertinent negatives for hypoglycemia include no dizziness, headaches or nervousness/anxiousness. Pertinent negatives for diabetes include no blurred vision, no chest pain, no fatigue, no foot paresthesias, no foot ulcerations, no polydipsia, no polyphagia, no polyuria, no visual change, no weakness and no weight loss. There are no hypoglycemic complications. Symptoms are stable. There are no diabetic complications. Pertinent negatives for diabetic complications include no CVA, PVD or retinopathy. There are no known risk factors for coronary artery disease. Current diabetic treatments: victoza. He is compliant with treatment all of the time. He is following a generally healthy diet. He participates in exercise three times a week. His breakfast blood glucose is taken between 8-9 am. His breakfast blood glucose range is generally 110-130 mg/dl. An ACE inhibitor/angiotensin II receptor blocker is being taken.  Hypertension This is a chronic problem. The current episode started more than 1 year ago. Pertinent negatives include no blurred vision, chest pain, headaches, neck pain, palpitations or shortness of breath. Past treatments include calcium channel blockers, beta blockers and angiotensin blockers. The current treatment provides moderate improvement. There are no compliance problems.  There is no history of angina, kidney disease, CAD/MI, CVA, heart failure, left ventricular hypertrophy, PVD or retinopathy. There is no history of chronic renal disease, a hypertension causing med or renovascular disease.    Hyperlipidemia This is a chronic problem. The current episode started more than 1 year ago. The problem is controlled. Recent lipid tests were reviewed and are normal. Exacerbating diseases include diabetes. He has no history of chronic renal disease, hypothyroidism, liver disease, obesity or nephrotic syndrome. Pertinent negatives include no chest pain, myalgias or shortness of breath. Current antihyperlipidemic treatment includes statins. The current treatment provides moderate improvement of lipids. There are no compliance problems.  Risk factors for coronary artery disease include dyslipidemia and hypertension.  Atrial Fibrillation Presents for follow-up visit. Symptoms include hypertension. Symptoms are negative for bradycardia, chest pain, dizziness, hypotension, palpitations, shortness of breath, tachycardia and weakness. The symptoms have been stable. Past medical history includes atrial fibrillation and hyperlipidemia.  Gastroesophageal Reflux He reports no abdominal pain, no belching, no chest pain, no coughing, no heartburn, no nausea, no sore throat or no wheezing. Pertinent negatives include no fatigue or weight loss.    Lab Results  Component Value Date   CREATININE 6.46 (H) 02/16/2020   BUN 78 (H) 02/16/2020   NA 138 02/16/2020   K 4.7 02/23/2020   CL 106 02/16/2020   CO2 21 (L) 02/16/2020   Lab Results  Component Value Date   CHOL 106 12/07/2019   HDL 35 (L) 12/07/2019   LDLCALC 57 12/07/2019   TRIG 63 12/07/2019   CHOLHDL 3.7 12/16/2016   Lab Results  Component Value Date   TSH 3.040 01/09/2019   Lab Results  Component Value Date   HGBA1C 6.0 (H) 12/07/2019   Lab Results  Component Value Date   WBC 6.2 02/16/2020   HGB 12.6 (L) 02/16/2020   HCT 37.1 (L) 02/16/2020   MCV 94.6 02/16/2020   PLT 157 02/16/2020   Lab Results  Component Value Date   ALT 34  06/09/2019   AST 26 06/09/2019   ALKPHOS 75 06/09/2019   BILITOT 0.5 06/09/2019     Review of  Systems  Constitutional: Negative for chills, fatigue, fever and weight loss.  HENT: Negative for drooling, ear discharge, ear pain and sore throat.   Eyes: Negative for blurred vision.  Respiratory: Negative for cough, shortness of breath and wheezing.   Cardiovascular: Negative for chest pain, palpitations and leg swelling.  Gastrointestinal: Negative for abdominal pain, blood in stool, constipation, diarrhea, heartburn and nausea.  Endocrine: Negative for polydipsia, polyphagia and polyuria.  Genitourinary: Negative for dysuria, frequency, hematuria and urgency.  Musculoskeletal: Negative for back pain, myalgias and neck pain.  Skin: Negative for rash.  Allergic/Immunologic: Negative for environmental allergies.  Neurological: Negative for dizziness, weakness and headaches.  Hematological: Does not bruise/bleed easily.  Psychiatric/Behavioral: Negative for suicidal ideas. The patient is not nervous/anxious.     Patient Active Problem List   Diagnosis Date Noted  . Polycystic liver disease 01/10/2020  . Polycystic kidney disease 03/01/2019  . Metabolic acidosis 75/04/2584  . CKD stage 4 due to type 2 diabetes mellitus ([place]) 01/25/2018  . Type 2 diabetes mellitus with diabetic nephropathy, without long-term current use of insulin ([place]) 01/25/2018  . Secondary hyperparathyroidism of renal origin ([place]) 08/19/2017  . Chronic atrial fibrillation ([place]) 01/06/2017  . Type 2 diabetes mellitus without complication, without long-term current use of insulin ([place]) 01/06/2017  . Essential hypertension 01/06/2017  . Hyperuricemia 01/06/2017  . Mixed hyperlipidemia 01/06/2017  . Gastroesophageal reflux disease 01/06/2017  . Obesity 12/26/2012    Allergies  Allergen Reactions  . Ciprofloxacin Other (See Comments)    hallucination  . Pollen Extract Other (See Comments)    Sneezing, watery eyes, etc.  . Caffeine Palpitations    palpatations    Past Surgical History:  Procedure Laterality  Date  . BASCILIC VEIN TRANSPOSITION Left 02/23/2020   Procedure: BASCILIC VEIN TRANSPOSITION (SINGLE STAGE);  Surgeon: Katha Cabal, MD;  Location: [facility] ORS;  Service: Vascular;  Laterality: Left;  . COLONOSCOPY  2012   hemorrhoids  . melanoma removal     forehead    Social History   Tobacco Use  . Smoking status: Never Smoker  . Smokeless tobacco: Never Used  Vaping Use  . Vaping Use: Never used  Substance Use Topics  . Alcohol use: No    Alcohol/week: 0.0 standard drinks  . Drug use: No     Medication list has been reviewed and updated.  Current Meds  Medication Sig  . allopurinol (ZYLOPRIM) 100 MG tablet Take 1 tablet (100 mg total) by mouth daily. (Patient taking differently: Take 100 mg by mouth at bedtime. )  . amLODipine (NORVASC) 10 MG tablet Take 1 tablet (10 mg total) by mouth daily.  Marland Kitchen b complex vitamins tablet Take 1 tablet by mouth daily.  . Cholecalciferol (D3 SUPER STRENGTH) 2000 units CAPS Take 2,000 Units by mouth every Monday, Tuesday, Wednesday, Thursday, and Friday.   . Insulin Pen Needle (NOVOFINE) 32G X 6 MM MISC USE AND DISCARD 1 PEN      NEEDLE SUBCUTANEOUSLY DAILYAS DIRECTED  . JANTOVEN 5 MG tablet TAKE 1 TABLET DAILY  . liraglutide (VICTOZA) 18 MG/3ML SOPN INJECT SUBCUTANEOUSLY 1.8MG  ONCE A DAY (Patient taking differently: Inject 1.8 mg into the skin daily. )  . loratadine (CLARITIN) 10 MG tablet Take 10 mg by mouth at bedtime. otc  . lovastatin (MEVACOR) 20 MG tablet Take 1 tablet (20 mg total) by mouth daily. (  Patient taking differently: Take 20 mg by mouth every evening. )  . metoprolol succinate (TOPROL-XL) 100 MG 24 hr tablet Take 1 tablet (100 mg total) by mouth daily. Take with or immediately following a meal.  . pantoprazole (PROTONIX) 40 MG tablet Take 1 tablet (40 mg total) by mouth daily.  Marland Kitchen spironolactone (ALDACTONE) 25 MG tablet Take 25 mg by mouth daily.  Marland Kitchen torsemide (DEMADEX) 10 MG tablet Take 10 mg by mouth daily as needed (ankle  swelling/fluid retention.).   Marland Kitchen warfarin (COUMADIN) 4 MG tablet Take 4 mg by mouth See admin instructions. On Tuesdays & Thursdays and Sat    PHQ 2/9 Scores 05/02/2020 01/10/2020 01/09/2019 07/07/2018  PHQ - 2 Score 0 0 0 0  PHQ- 9 Score 0 - - 2    GAD 7 : Generalized Anxiety Score 05/02/2020  Nervous, Anxious, on Edge 0  Control/stop worrying 0  Worry too much - different things 0  Trouble relaxing 0  Restless 0  Easily annoyed or irritable 0  Afraid - awful might happen 0  Total GAD 7 Score 0    BP Readings from Last 3 Encounters:  05/02/20 120/62  03/07/20 106/69  02/23/20 120/61    Physical Exam Vitals and nursing note reviewed.  HENT:     Head: Normocephalic.     Right Ear: External ear normal.     Left Ear: External ear normal.     Nose: Nose normal.  Eyes:     General: No scleral icterus.       Right eye: No discharge.        Left eye: No discharge.     Conjunctiva/sclera: Conjunctivae normal.     Pupils: Pupils are equal, round, and reactive to light.  Neck:     Thyroid: No thyromegaly.     Vascular: No JVD.     Trachea: No tracheal deviation.  Cardiovascular:     Rate and Rhythm: Normal rate and regular rhythm.     Pulses:          Carotid pulses are 2+ on the right side and 2+ on the left side.      Radial pulses are 2+ on the right side and 2+ on the left side.       Femoral pulses are 2+ on the right side and 2+ on the left side.      Popliteal pulses are 2+ on the right side and 2+ on the left side.       Dorsalis pedis pulses are 2+ on the right side and 2+ on the left side.       Posterior tibial pulses are 2+ on the right side and 2+ on the left side.     Heart sounds: Normal heart sounds, S1 normal and S2 normal. No murmur heard.  No systolic murmur is present.  No diastolic murmur is present.  No friction rub. No gallop. No S3 or S4 sounds.   Pulmonary:     Effort: No respiratory distress.     Breath sounds: Normal breath sounds. No wheezing,  rhonchi or rales.  Abdominal:     General: Bowel sounds are normal.     Palpations: Abdomen is soft. There is no mass.     Tenderness: There is no abdominal tenderness. There is no guarding or rebound.  Musculoskeletal:        General: No tenderness. Normal range of motion.     Cervical back: Normal range of motion and neck supple.  Right foot: Normal range of motion. No deformity.     Left foot: Normal range of motion. No deformity.  Feet:     Right foot:     Protective Sensation: 10 sites tested. 10 sites sensed.     Skin integrity: Skin integrity normal. No ulcer, blister, skin breakdown, erythema, warmth, callus, dry skin or fissure.     Toenail Condition: Right toenails are abnormally thick.     Left foot:     Protective Sensation: 10 sites tested. 10 sites sensed.     Skin integrity: Skin integrity normal. No ulcer, blister, skin breakdown, erythema, warmth, callus, dry skin or fissure.     Toenail Condition: Left toenails are abnormally thick.  Lymphadenopathy:     Cervical: No cervical adenopathy.  Skin:    General: Skin is warm.     Capillary Refill: Capillary refill takes less than 2 seconds.     Findings: No rash.  Neurological:     Mental Status: He is alert and oriented to person, place, and time.     Cranial Nerves: No cranial nerve deficit.     Deep Tendon Reflexes: Reflexes are normal and symmetric.     Wt Readings from Last 3 Encounters:  05/02/20 257 lb (116.6 kg)  03/07/20 246 lb (111.6 kg)  02/23/20 252 lb 13.9 oz (114.7 kg)    BP 120/62   Pulse 72   Ht 6' (1.829 m)   Wt 257 lb (116.6 kg)   BMI 34.86 kg/m   Assessment and Plan: 1. Type 2 diabetes mellitus with diabetic nephropathy, without long-term current use of insulin (HCC) Chronic.  Controlled.  Stable.  Continue Victoza 1.8 mg once a day.  Will check A1c. - liraglutide (VICTOZA) 18 MG/3ML SOPN; INJECT SUBCUTANEOUSLY 1.8MG  ONCE A DAY  Dispense: 27 mL; Refill: 1 - HgB A1c  2. Essential  hypertension 1.  Controlled.  Stable.  Continue amlodipine 10 mg, metoprolol XL 100 mg, and low-sodium diet. - amLODipine (NORVASC) 10 MG tablet; Take 1 tablet (10 mg total) by mouth daily.  Dispense: 90 tablet; Refill: 1 - metoprolol succinate (TOPROL-XL) 100 MG 24 hr tablet; Take 1 tablet (100 mg total) by mouth daily. Take with or immediately following a meal.  Dispense: 90 tablet; Refill: 1  3. Thrombophilia (HCC) Chronic.  Controlled.  Stable.  Patient is on Coumadin and we will check PT/INR for dosing adjustment. - INR/PT  4. Chronic atrial fibrillation (HCC) Chronic.  Controlled.  Stable.  Patient is on prophylaxis for thrombophilia with warfarin 5 mg 1 a day. - warfarin (JANTOVEN) 5 MG tablet; Take 1 tablet (5 mg total) by mouth daily.  Dispense: 90 tablet; Refill: 1  5. Mixed hyperlipidemia [place].  Controlled.  Stable.  Continue lovastatin 20 mg once a day.  Will check lipid panel. - lovastatin (MEVACOR) 20 MG tablet; Take 1 tablet (20 mg total) by mouth daily.  Dispense: 90 tablet; Refill: 1 - Lipid Panel With LDL/HDL Ratio  6. Gastroesophageal reflux disease, unspecified whether esophagitis present Chronic.  Controlled.  Stable.  Will continue pantoprazole 40 mg once a day. - pantoprazole (PROTONIX) 40 MG tablet; Take 1 tablet (40 mg total) by mouth daily.  Dispense: 90 tablet; Refill: 1  7. Hyperuricemia Chronic.  Controlled.  Stable.  Will continue allopurinol 100 mg a day for control of uric acid. - allopurinol (ZYLOPRIM) 100 MG tablet; Take 1 tablet (100 mg total) by mouth daily.  Dispense: 90 tablet; Refill: 1

## 2020-05-03 LAB — LIPID PANEL WITH LDL/HDL RATIO
Cholesterol, Total: 100 mg/dL (ref 100–199)
HDL: 34 mg/dL — ABNORMAL LOW (ref >39)
LDL Chol Calc (NIH): 55 mg/dL (ref 0–99)
LDL/HDL Ratio: 1.6 ratio (ref 0.0–3.6)
Triglycerides: 42 mg/dL (ref 0–149)
VLDL Cholesterol Cal: 11 mg/dL (ref 5–40)

## 2020-05-03 LAB — HEMOGLOBIN A1C
Est. average glucose Bld gHb Est-mCnc: 128 mg/dL
Hgb A1c MFr Bld: 6.1 % — ABNORMAL HIGH (ref 4.8–5.6)

## 2020-05-03 LAB — PROTIME-INR
INR: 2.2 — ABNORMAL HIGH (ref 0.9–1.2)
Prothrombin Time: 22.5 s — ABNORMAL HIGH (ref 9.1–12.0)

## 2020-05-13 ENCOUNTER — Telehealth: Payer: Self-pay

## 2020-05-13 NOTE — Chronic Care Management (AMB) (Signed)
  Chronic Care Management   Note  05/13/2020 Name: Mark Bauer MRN: [id number] DOB: 12-23-1955  [name] is a 64 y.o. year old male who is a primary care patient of Juline Patch, MD. I reached out to [name] by phone today in response to a referral sent by Mr. Dayshaun Whobrey Wojtkiewicz's health plan.     Mr. [name] was given information about Chronic Care Management services today including:  1. CCM service includes personalized support from designated clinical staff supervised by his physician, including individualized plan of care and coordination with other care providers 2. 24/7 contact phone numbers for assistance for urgent and routine care needs. 3. Service will only be billed when office clinical staff spend 20 minutes or more in a month to coordinate care. 4. Only one practitioner may furnish and bill the service in a calendar month. 5. The patient may stop CCM services at any time (effective at the end of the month) by phone call to the office staff. 6. The patient will be responsible for cost sharing (co-pay) of up to 20% of the service fee (after annual deductible is met).  Patient agreed to services and verbal consent obtained.   Follow up plan: Telephone appointment with care management team member scheduled for:05/16/2020  Noreene Larsson, [address], [place], [place] [address] Direct Dial: [phone] [email or web address]: [email or web address]

## 2020-05-16 ENCOUNTER — Ambulatory Visit: Payer: Medicare Other | Admitting: Pharmacist

## 2020-05-16 ENCOUNTER — Other Ambulatory Visit: Payer: Self-pay

## 2020-05-16 DIAGNOSIS — I10 Essential (primary) hypertension: Secondary | ICD-10-CM

## 2020-05-16 DIAGNOSIS — I482 Chronic atrial fibrillation, unspecified: Secondary | ICD-10-CM

## 2020-05-16 DIAGNOSIS — E1121 Type 2 diabetes mellitus with diabetic nephropathy: Secondary | ICD-10-CM

## 2020-05-16 MED ORDER — WARFARIN SODIUM 2 MG PO TABS: 4.0000 mg | ORAL_TABLET | Freq: Every day | ORAL | 0 refills | Status: DC

## 2020-05-16 NOTE — Patient Instructions (Addendum)
Visit Information  It was a pleasure speaking with you today! Thank you for letting me be a part of your care team. Please call with any questions or concerns.  Goals Addressed            This Visit's Progress   . Pharmacy care plan       CARE PLAN ENTRY (see longitudinal plan of care for additional care plan information)  Current Barriers:  . Chronic Disease Management support, education, and care coordination needs related to Hypertension, Hyperlipidemia, Diabetes, Atrial Fibrillation, GERD, and Gout   Hypertension/A fib BP Readings from Last 3 Encounters:  05/02/20 120/62  03/07/20 106/69  02/23/20 120/61   . Pharmacist Clinical Goal(s): o Over the next 90 days, patient will work with PharmD and providers to achieve BP goal <130/80 . Current regimen:  o Warfarin 5 mg mwfsun, 4 mg tuthsat . Spironolactone 25 mg . Metoprolol succinate . Amlodipine 10 mg qd . Torsemide 10 mg qd prn swelling ( not needing currently) . Interventions: o Provided diet and exercise counseling. o Reviewed signs/symptoms of bleeding o Will ask PCP to send RX for warfarin 2 mg tablets to [facility] . Patient self care activities - Over the next 90 days, patient will: o Check BP 3-4 times weekly, document, and provide at future appointments o Ensure daily salt intake < 2300 mg/day  Hyperlipidemia Lab Results  Component Value Date/Time   LDLCALC 55 05/02/2020 08:49 AM   . Pharmacist Clinical Goal(s): o Over the next 90 days, patient will work with PharmD and providers to maintain LDL goal < 70 . Current regimen:  o Lovastatin 20 mg qd . Interventions: o Provided diet and exercise counseling. . Patient self care activities - Over the next 90 days, patient will: o Continue heart healthy, low-sodium diet. o Attend all scheduled appointments  Diabetes Lab Results  Component Value Date/Time   HGBA1C 6.1 (H) 05/02/2020 08:49 AM   HGBA1C 6.0 (H) 12/07/2019 10:26 AM   . Pharmacist Clinical  Goal(s): o Over the next 90 days, patient will work with PharmD and providers to maintain A1c goal <7% . Current regimen:  o Victoza 1.8mg  daily  . Interventions: o Collaborate with prescriber to send in RX for new meter and supplies o Will initiate PAP for Victoza to help alleviate financial burden. . Patient self care activities - Over the next 90 days, patient will: o Check blood sugar once daily, document, and provide at future appointments o Contact provider with any episodes of hypoglyce  Medication management . Pharmacist Clinical Goal(s): o Over the next 90 days, patient will work with PharmD and providers to maintain optimal medication adherence . Current pharmacy: [facility] . Interventions o Comprehensive medication review performed. o Continue current medication management strategy . Patient self care activities - Over the next 90 days, patient will: o Focus on medication adherence by fill dates o Take medications as prescribed o Report any questions or concerns to PharmD and/or provider(s)  Initial goal documentation        Mark Bauer was given information about Chronic Care Management services today including:  1. CCM service includes personalized support from designated clinical staff supervised by his physician, including individualized plan of care and coordination with other care providers 2. 24/7 contact phone numbers for assistance for urgent and routine care needs. 3. Standard insurance, coinsurance, copays and deductibles apply for chronic care management only during months in which we provide at least 20 minutes of these services.  Most insurances cover these services at 100%, however patients may be responsible for any copay, coinsurance and/or deductible if applicable. This service may help you avoid the need for more expensive face-to-face services. 4. Only one practitioner may furnish and bill the service in a calendar month. 5. The patient may stop CCM  services at any time (effective at the end of the month) by phone call to the office staff.  Patient agreed to services and verbal consent obtained.   The patient verbalized understanding of instructions provided today and agreed to receive a mailed copy of patient instruction and/or educational materials. Telephone follow up appointment with pharmacy team member scheduled for: 3 months  Junita Push. Alessandra Sawdey PharmD, BCPS Clinical Pharmacist [phone]  Diabetes Mellitus and Nutrition, Adult When you have diabetes (diabetes mellitus), it is very important to have healthy eating habits because your blood sugar (glucose) levels are greatly affected by what you eat and drink. Eating healthy foods in the appropriate amounts, at about the same times every day, can help you:  Control your blood glucose.  Lower your risk of heart disease.  Improve your blood pressure.  Reach or maintain a healthy weight. Every person with diabetes is different, and each person has different needs for a meal plan. Your health care provider may recommend that you work with a diet and nutrition specialist (dietitian) to make a meal plan that is best for you. Your meal plan may vary depending on factors such as:  The calories you need.  The medicines you take.  Your weight.  Your blood glucose, blood pressure, and cholesterol levels.  Your activity level.  Other health conditions you have, such as heart or kidney disease. How do carbohydrates affect me? Carbohydrates, also called carbs, affect your blood glucose level more than any other type of food. Eating carbs naturally raises the amount of glucose in your blood. Carb counting is a method for keeping track of how many carbs you eat. Counting carbs is important to keep your blood glucose at a healthy level, especially if you use insulin or take certain oral diabetes medicines. It is important to know how many carbs you can safely have in each meal. This is  different for every person. Your dietitian can help you calculate how many carbs you should have at each meal and for each snack. Foods that contain carbs include:  Bread, cereal, rice, pasta, and crackers.  Potatoes and corn.  Peas, beans, and lentils.  Milk and yogurt.  Fruit and juice.  Desserts, such as cakes, cookies, ice cream, and candy. How does alcohol affect me? Alcohol can cause a sudden decrease in blood glucose (hypoglycemia), especially if you use insulin or take certain oral diabetes medicines. Hypoglycemia can be a life-threatening condition. Symptoms of hypoglycemia (sleepiness, dizziness, and confusion) are similar to symptoms of having too much alcohol. If your health care provider says that alcohol is safe for you, follow these guidelines:  Limit alcohol intake to no more than 1 drink per day for nonpregnant women and 2 drinks per day for men. One drink equals 12 oz of beer, 5 oz of wine, or 1 oz of hard liquor.  Do not drink on an empty stomach.  Keep yourself hydrated with water, diet soda, or unsweetened iced tea.  Keep in mind that regular soda, juice, and other mixers may contain a lot of sugar and must be counted as carbs. What are tips for following this plan?  Reading food labels  Start  by checking the serving size on the "Nutrition Facts" label of packaged foods and drinks. The amount of calories, carbs, fats, and other nutrients listed on the label is based on one serving of the item. Many items contain more than one serving per package.  Check the total grams (g) of carbs in one serving. You can calculate the number of servings of carbs in one serving by dividing the total carbs by 15. For example, if a food has 30 g of total carbs, it would be equal to 2 servings of carbs.  Check the number of grams (g) of saturated and trans fats in one serving. Choose foods that have low or no amount of these fats.  Check the number of milligrams (mg) of salt  (sodium) in one serving. Most people should limit total sodium intake to less than 2,300 mg per day.  Always check the nutrition information of foods labeled as "low-fat" or "nonfat". These foods may be higher in added sugar or refined carbs and should be avoided.  Talk to your dietitian to identify your daily goals for nutrients listed on the label. Shopping  Avoid buying canned, premade, or processed foods. These foods tend to be high in fat, sodium, and added sugar.  Shop around the outside edge of the grocery store. This includes fresh fruits and vegetables, bulk grains, fresh meats, and fresh dairy. Cooking  Use low-heat cooking methods, such as baking, instead of high-heat cooking methods like deep frying.  Cook using healthy oils, such as olive, canola, or sunflower oil.  Avoid cooking with butter, cream, or high-fat meats. Meal planning  Eat meals and snacks regularly, preferably at the same times every day. Avoid going long periods of time without eating.  Eat foods high in fiber, such as fresh fruits, vegetables, beans, and whole grains. Talk to your dietitian about how many servings of carbs you can eat at each meal.  Eat 4-6 ounces (oz) of lean protein each day, such as lean meat, chicken, fish, eggs, or tofu. One oz of lean protein is equal to: ? 1 oz of meat, chicken, or fish. ? 1 egg. ?  cup of tofu.  Eat some foods each day that contain healthy fats, such as avocado, nuts, seeds, and fish. Lifestyle  Check your blood glucose regularly.  Exercise regularly as told by your health care provider. This may include: ? 150 minutes of moderate-intensity or vigorous-intensity exercise each week. This could be brisk walking, biking, or water aerobics. ? Stretching and doing strength exercises, such as yoga or weightlifting, at least 2 times a week.  Take medicines as told by your health care provider.  Do not use any products that contain nicotine or tobacco, such as  cigarettes and e-cigarettes. If you need help quitting, ask your health care provider.  Work with a Social worker or diabetes educator to identify strategies to manage stress and any emotional and social challenges. Questions to ask a health care provider  Do I need to meet with a diabetes educator?  Do I need to meet with a dietitian?  What number can I call if I have questions?  When are the best times to check my blood glucose? Where to find more information:  American Diabetes Association: diabetes.org  Academy of Nutrition and Dietetics: www.eatright.[employer] of Diabetes and Digestive and Kidney Diseases (NIH): [employer] Summary  A healthy meal plan will help you control your blood glucose and maintain a healthy lifestyle.  Working with a  diet and nutrition specialist (dietitian) can help you make a meal plan that is best for you.  Keep in mind that carbohydrates (carbs) and alcohol have immediate effects on your blood glucose levels. It is important to count carbs and to use alcohol carefully. This information is not intended to replace advice given to you by your health care provider. Make sure you discuss any questions you have with your health care provider. Document Revised: 06/18/2017 Document Reviewed: 08/10/2016 Elsevier Patient Education  2020 [employer].

## 2020-05-16 NOTE — Progress Notes (Unsigned)
Sent in warfarin 2mg  to [facility]

## 2020-05-16 NOTE — Progress Notes (Signed)
Chronic Care Management Pharmacy  Name: Mark Bauer  MRN: [id number] DOB: Jul 07, 1956   Chief Complaint/ HPI  Mark Bauer,  64 y.o. , male presents for their Initial CCM visit with the clinical pharmacist via telephone.  PCP : Juline Patch, MD Patient Care Team: Juline Patch, MD as PCP - General (Family Medicine) Vladimir Faster, [facility] (Pharmacist)  Their chronic conditions include: Hypertension, Hyperlipidemia, Diabetes, Atrial Fibrillation, GERD and Chronic Kidney Disease, Gout  Office Visits: 05/02/20- Dr. Ronnald Ramp- blood work  Consult Visit: 03/07/20- Dr. Delana Meyer, Vascular- postop check AVF/AVG access (Ultrasound 11/18) 8/3//21- Dr. Percell Miller, Nephrology - referral to transplant, labwork, refill Toprol XL and spironolactone   Allergies  Allergen Reactions  . Ciprofloxacin Other (See Comments)    hallucination  . Pollen Extract Other (See Comments)    Sneezing, watery eyes, etc.  . Caffeine Palpitations    palpatations    Medications: Outpatient Encounter Medications as of 05/16/2020  Medication Sig  . allopurinol (ZYLOPRIM) 100 MG tablet Take 1 tablet (100 mg total) by mouth daily.  Marland Kitchen amLODipine (NORVASC) 10 MG tablet Take 1 tablet (10 mg total) by mouth daily.  Marland Kitchen b complex vitamins tablet Take 1 tablet by mouth daily.  . Cholecalciferol (D3 SUPER STRENGTH) 2000 units CAPS Take 2,000 Units by mouth every Monday, Tuesday, Wednesday, Thursday, and Friday.   . liraglutide (VICTOZA) 18 MG/3ML SOPN INJECT SUBCUTANEOUSLY 1.8MG ONCE A DAY  . loratadine (CLARITIN) 10 MG tablet Take 10 mg by mouth at bedtime. otc  . lovastatin (MEVACOR) 20 MG tablet Take 1 tablet (20 mg total) by mouth daily.  . metoprolol succinate (TOPROL-XL) 100 MG 24 hr tablet Take 1 tablet (100 mg total) by mouth daily. Take with or immediately following a meal.  . NOVOFINE PEN NEEDLE 32G X 6 MM MISC USE AND DISCARD 1 PEN      NEEDLE SUBCUTANEOUSLY DAILYAS DIRECTED  . pantoprazole (PROTONIX) 40 MG  tablet Take 1 tablet (40 mg total) by mouth daily.  Marland Kitchen spironolactone (ALDACTONE) 25 MG tablet Take 25 mg by mouth daily.  Marland Kitchen torsemide (DEMADEX) 10 MG tablet Take 10 mg by mouth daily as needed (ankle swelling/fluid retention.).   Marland Kitchen warfarin (COUMADIN) 4 MG tablet Take 1 tablet (4 mg total) by mouth See admin instructions. On Tuesdays & Thursdays and Sat  . warfarin (JANTOVEN) 5 MG tablet Take 1 tablet (5 mg total) by mouth daily.   No facility-administered encounter medications on file as of 05/16/2020.    Wt Readings from Last 3 Encounters:  05/02/20 257 lb (116.6 kg)  03/07/20 246 lb (111.6 kg)  02/23/20 252 lb 13.9 oz (114.7 kg)    Current Diagnosis/Assessment:    Goals Addressed   None     Diabetes   A1c goal <7%  Recent Relevant Labs: Lab Results  Component Value Date/Time   HGBA1C 6.1 (H) 05/02/2020 08:49 AM   HGBA1C 6.0 (H) 12/07/2019 10:26 AM    Last diabetic Eye exam:  Lab Results  Component Value Date/Time   HMDIABEYEEXA No Retinopathy 03/15/2020 12:00 AM    Last diabetic Foot exam: No results found for: HMDIABFOOTEX   Checking BG: 3 times weekly  Recent FBG Readings: <120  Patient has failed these meds in past: NA Patient is currently controlled on the following medications: . Victoza 1.8 mg qd  We discussed: diet and exercise extensively and how to recognize and treat signs of hypoglycemia.Denies any episodes of hypoglycemia. Patient is very satisfied with Victoza  and denies any adverse effects. Financial strain of copayment is his main concern. Patient reports trouble finding test strips for his meter which is ~ 65 years old. Will pursue PAP for Victoza and collaborate with PCP to send RX for new meter, strips and lancets.  Plan  Continue current medications  AFIB   Patient is currently rate controlled. Office heart rates are  Pulse Readings from Last 3 Encounters:  05/02/20 72  03/07/20 (!) 116  02/23/20 79   05/02/20 INR 2.2 PT 22.5    02/07/20 H/H=12.6/37.1 plt 157  CHA2DS2-VASc Score = 3  The patient's score is based upon: CHF History: 0 HTN History: 1 Diabetes History: 1 Stroke History: 0 Vascular Disease History: 0 Age Score: 1 Gender Score: 0  { Patient has failed these meds in past: NA Patient is currently controlled on the following medications:  . Warfarin 5 mg mwfsun, 4 mg tuthsat . Metoprolol succinate 100 mg qd . Torsemide 10 mg prn swelling (patient reports not needing)  We discussed:  [facility] Jantoven 4 mg is unavailable. He currently has adequate supply of 2 mg tablets. Will collaborate with PCP to send new RX for 2 mg tablets. Reviewed signs and symptoms of bleeding - patient denies. We discussed DOAC therapy which patient states has been cost prohibitive. He is switching Part D plan to [facility] in January. Will revisit copay at that time. Patient does not follow with cardiology.  Plan  Continue current medications.   Hypertension/CKDIV   BP goal is:  <130/80  Office blood pressures are  BP Readings from Last 3 Encounters:  05/02/20 120/62  03/07/20 106/69  02/23/20 120/61   BMP Latest Ref Rng & Units 02/23/2020 02/16/2020 09/27/2019  Glucose 70 - 99 mg/dL - 109(H) -  BUN 8 - 23 mg/dL - 78(H) 54(A)  Creatinine 0.61 - 1.24 mg/dL - 6.46(H) 4.2(A)  BUN/Creat Ratio 10 - 24 - - -  Sodium 135 - 145 mmol/L - 138 -  Potassium 3.5 - 5.1 mmol/L 4.7 5.1 -  Chloride 98 - 111 mmol/L - 106 -  CO2 22 - 32 mmol/L - 21(L) -  Calcium 8.9 - 10.3 mg/dL - 9.6 -    Patient checks BP at home 3-5x per week Patient home BP readings are ranging: 100/60-70s  Patient has failed these meds in the past: Triamterene/HCTZ, ARB discontinued secondary to worsening kidney function by nephrology. Patient is currently controlled on the following medications:  . Spironolactone 25 mg . Metoprolol succinate . Amlodipine 10 mg qd . Torsemide 10 mg qd prn swelling ( not needing  currently)  We discussed Patient has noticed lower BP since nephrology changed to spironolactone. He denies any dizziness, lightheadedness or fatigue. Patient recently had AVF/AVG acces in preparation of dialysis in future. He has ultrasound to evaluate on 06/06/20.  Plan  Continue current medications     Hyperlipidemia   LDL goal < 70  Last lipids Lab Results  Component Value Date   CHOL 100 05/02/2020   HDL 34 (L) 05/02/2020   LDLCALC 55 05/02/2020   TRIG 42 05/02/2020   CHOLHDL 3.7 12/16/2016   Hepatic Function Latest Ref Rng & Units 06/09/2019 01/09/2019 01/02/2019  Total Protein 6.0 - 8.5 g/dL 7.1 - 6.2  Albumin 3.8 - 4.8 g/dL 4.5 4.5 4.2  AST 0 - 40 IU/L _0 ALT 0 - 44 IU/L 34 18 17  Alk Phosphatase 39 - 117 IU/L 75 87  75  Total Bilirubin 0.0 - 1.2 mg/dL 0.5 0.6 0.5  Bilirubin, Direct 0.00 - 0.40 mg/dL 0.14 0.18 0.16     The ASCVD Risk score Mikey Bussing DC Jr., et al., 2013) failed to calculate for the following reasons:   The valid total cholesterol range is 130 to 320 mg/dL   Patient has failed these meds in past: NA Patient is currently controlled on the following medications:  . Lovastatin 20 mg qd  We discussed:  diet and exercise extensively  Plan  Continue current medications  GERD   Patient has failed these meds in past: NA Patient is currently controlled on the following medications:  . Pantoprazole 40 mg qd  Will discuss at follow up visit.  Plan  Continue current medications  Hyperuricemia/ Gout   Uric Acid level 6.0 02/06/20  LFT's WNL 110/20/20 Patient has failed these meds in past: NA  Patient is currently controlled on the following medications:  . Allopurinol 100 mg qd  We discussed:  Patient is tolerating dose. Uric acid level checked at Nephrology. No recent flares.  Denies any adverse effects; no rash, pruritis.    Plan  Continue current medications   Vaccines   Reviewed and discussed patient's vaccination history.     Immunization History  Administered Date(s) Administered  . Influenza, High Dose Seasonal PF 05/04/2019  . Influenza,inj,Quad PF,6+ Mos 04/23/2017, 05/02/2018  . Influenza-Unspecified 05/04/2019, 04/29/2020  . PFIZER SARS-COV-2 Vaccination 09/15/2019, 10/11/2019  . Pneumococcal Conjugate-13 06/09/2019  . Pneumococcal Polysaccharide-23 12/27/2015  . Tdap 07/26/2017   Patient  Plan  Recommended patient receive Shingrix vaccine.      Health Maintenance    . Vitamin D3 2000 units daily . B complex daily    Will discuss at follow up. Patient states levels followed by Nephrology.  Plan  Continue current medications    Medication Management   Pt uses [facility] for all medications Uses pill box? Yes Pt endorses 99% compliance  We discussed: Discussed benefits of medication synchronization, packaging and delivery as well as enhanced pharmacist oversight with [employer]. Patient is changing Part d plans in January. He also get funding from [employer] benefits program to be used at their approved affiliates. He would like to revisit after new coverage in place.  Plan  Continue current medication management strategy    Follow up: 3 month phone visit  Junita Push. Kenton Kingfisher PharmD, [facility] [phone]

## 2020-05-17 NOTE — Progress Notes (Signed)
I, Otilio Miu, MD, have reviewed all documentation for this visit. The documentation on 05/17/20 for the exam, diagnosis, procedures, and orders are all accurate and complete.

## 2020-05-20 DIAGNOSIS — N184 Chronic kidney disease, stage 4 (severe): Secondary | ICD-10-CM | POA: Diagnosis not present

## 2020-05-20 DIAGNOSIS — E872 Acidosis: Secondary | ICD-10-CM | POA: Diagnosis not present

## 2020-05-29 DIAGNOSIS — Q613 Polycystic kidney, unspecified: Secondary | ICD-10-CM | POA: Diagnosis not present

## 2020-05-29 DIAGNOSIS — N2581 Secondary hyperparathyroidism of renal origin: Secondary | ICD-10-CM | POA: Diagnosis not present

## 2020-05-29 DIAGNOSIS — Z6835 Body mass index (BMI) 35.0-35.9, adult: Secondary | ICD-10-CM | POA: Diagnosis not present

## 2020-05-29 DIAGNOSIS — N185 Chronic kidney disease, stage 5: Secondary | ICD-10-CM | POA: Diagnosis not present

## 2020-05-29 DIAGNOSIS — M1A9XX0 Chronic gout, unspecified, without tophus (tophi): Secondary | ICD-10-CM | POA: Diagnosis not present

## 2020-05-29 DIAGNOSIS — I10 Essential (primary) hypertension: Secondary | ICD-10-CM | POA: Diagnosis not present

## 2020-06-03 ENCOUNTER — Other Ambulatory Visit: Payer: Medicare Other

## 2020-06-03 ENCOUNTER — Other Ambulatory Visit: Payer: Self-pay

## 2020-06-03 ENCOUNTER — Telehealth: Payer: Self-pay

## 2020-06-03 DIAGNOSIS — I482 Chronic atrial fibrillation, unspecified: Secondary | ICD-10-CM

## 2020-06-03 NOTE — Telephone Encounter (Signed)
Copied from [place] [id number]. Topic: General - Other >> Jun 03, 2020  8:12 AM Celene Kras wrote: Reason for CRM: Pt called and is requesting to have lab orders placed to have a PT INR test done. Please advise.

## 2020-06-03 NOTE — Telephone Encounter (Signed)
Put on schedule

## 2020-06-04 LAB — PROTIME-INR
INR: 3.2 — ABNORMAL HIGH (ref 0.9–1.2)
Prothrombin Time: 32 s — ABNORMAL HIGH (ref 9.1–12.0)

## 2020-06-06 ENCOUNTER — Ambulatory Visit (INDEPENDENT_AMBULATORY_CARE_PROVIDER_SITE_OTHER): Payer: Medicare Other

## 2020-06-06 ENCOUNTER — Other Ambulatory Visit: Payer: Self-pay

## 2020-06-06 ENCOUNTER — Encounter (INDEPENDENT_AMBULATORY_CARE_PROVIDER_SITE_OTHER): Payer: Self-pay | Admitting: Vascular Surgery

## 2020-06-06 ENCOUNTER — Ambulatory Visit (INDEPENDENT_AMBULATORY_CARE_PROVIDER_SITE_OTHER): Payer: Medicare Other | Admitting: Vascular Surgery

## 2020-06-06 VITALS — BP 103/66 | HR 80 | Resp 16 | Wt 262.6 lb

## 2020-06-06 DIAGNOSIS — I482 Chronic atrial fibrillation, unspecified: Secondary | ICD-10-CM

## 2020-06-06 DIAGNOSIS — T829XXS Unspecified complication of cardiac and vascular prosthetic device, implant and graft, sequela: Secondary | ICD-10-CM

## 2020-06-06 DIAGNOSIS — N184 Chronic kidney disease, stage 4 (severe): Secondary | ICD-10-CM | POA: Diagnosis not present

## 2020-06-06 DIAGNOSIS — E1122 Type 2 diabetes mellitus with diabetic chronic kidney disease: Secondary | ICD-10-CM | POA: Diagnosis not present

## 2020-06-06 DIAGNOSIS — I10 Essential (primary) hypertension: Secondary | ICD-10-CM

## 2020-06-06 DIAGNOSIS — E119 Type 2 diabetes mellitus without complications: Secondary | ICD-10-CM | POA: Diagnosis not present

## 2020-06-06 DIAGNOSIS — T829XXA Unspecified complication of cardiac and vascular prosthetic device, implant and graft, initial encounter: Secondary | ICD-10-CM | POA: Insufficient documentation

## 2020-06-06 NOTE — Progress Notes (Signed)
MRN : [id number]  Mark Bauer is a 64 y.o. (May 19, 1956) male who presents with chief complaint of  Chief Complaint  Patient presents with  . Follow-up    35month ultrasound follow up  .  History of Present Illness:   The patient returns to the office for followup of their dialysis access.  He is status post transposition of a left basilic vein February 22, 4009.    The function of the access has been stable. The patient has not yet started hemodialysis.  He is not quite certain of what his most recent GFR is but he states he has been stable and he is symptom-free. The patient denies hand pain or other symptoms consistent with steal phenomena.  No significant arm swelling.  The patient denies redness or swelling at the access site. The patient denies fever or chills at home or while on dialysis.  The patient denies amaurosis fugax or recent TIA symptoms. There are no recent neurological changes noted. The patient denies claudication symptoms or rest pain symptoms. The patient denies history of DVT, PE or superficial thrombophlebitis. The patient denies recent episodes of angina or shortness of breath.   Duplex ultrasound of the AV access shows a patent access with uniform velocities.  Mild narrowing is noted in the graft but no focal hemodynamically significant stenosis.     Current Meds  Medication Sig  . allopurinol (ZYLOPRIM) 100 MG tablet Take 1 tablet (100 mg total) by mouth daily.  Marland Kitchen amLODipine (NORVASC) 10 MG tablet Take 1 tablet (10 mg total) by mouth daily.  Marland Kitchen b complex vitamins tablet Take 1 tablet by mouth daily.  . Cholecalciferol (D3 SUPER STRENGTH) 2000 units CAPS Take 2,000 Units by mouth daily.   . DHA-EPA-VIT B6-B12-FOLIC ACID PO Take 2,725 Units by mouth daily.  Marland Kitchen liraglutide (VICTOZA) 18 MG/3ML SOPN INJECT SUBCUTANEOUSLY 1.8MG  ONCE A DAY  . loratadine (CLARITIN) 10 MG tablet Take 10 mg by mouth at bedtime. otc  . lovastatin (MEVACOR) 20 MG tablet Take 1 tablet  (20 mg total) by mouth daily.  . metoprolol succinate (TOPROL-XL) 100 MG 24 hr tablet Take 1 tablet (100 mg total) by mouth daily. Take with or immediately following a meal.  . NOVOFINE PEN NEEDLE 32G X 6 MM MISC USE AND DISCARD 1 PEN      NEEDLE SUBCUTANEOUSLY DAILYAS DIRECTED  . pantoprazole (PROTONIX) 40 MG tablet Take 1 tablet (40 mg total) by mouth daily.  Marland Kitchen spironolactone (ALDACTONE) 25 MG tablet Take 25 mg by mouth daily.  Marland Kitchen torsemide (DEMADEX) 10 MG tablet Take 10 mg by mouth daily as needed (ankle swelling/fluid retention.). Takes  For swelling has not needed  . warfarin (COUMADIN) 2 MG tablet Take 2 tablets (4 mg total) by mouth daily.  Marland Kitchen warfarin (JANTOVEN) 5 MG tablet Take 1 tablet (5 mg total) by mouth daily.    Past Medical History:  Diagnosis Date  . Allergy   . Bilateral undescended testicles   . Cancer ([place])    melanoma on forehead  . Diabetes mellitus without complication ([place])   . Dysrhythmia    a fib  . GERD (gastroesophageal reflux disease)   . Gout   . Hepatitis    polycystic   . Hypertension   . Hypokalemia   . Mixed hyperlipidemia   . Polycystic kidney   . Vitamin D deficiency     Past Surgical History:  Procedure Laterality Date  . BASCILIC VEIN TRANSPOSITION Left 02/23/2020  Procedure: BASCILIC VEIN TRANSPOSITION (SINGLE STAGE);  Surgeon: Katha Cabal, MD;  Location: [facility] ORS;  Service: Vascular;  Laterality: Left;  . COLONOSCOPY  2012   hemorrhoids  . melanoma removal     forehead    Social History Social History   Tobacco Use  . Smoking status: Never Smoker  . Smokeless tobacco: Never Used  Vaping Use  . Vaping Use: Never used  Substance Use Topics  . Alcohol use: No    Alcohol/week: 0.0 standard drinks  . Drug use: No    Family History Family History  Problem Relation Age of Onset  . Diabetes Mother   . Heart disease Father     Allergies  Allergen Reactions  . Ciprofloxacin Other (See Comments)    hallucination  .  Pollen Extract Other (See Comments)    Sneezing, watery eyes, etc.  . Caffeine Palpitations    palpatations     REVIEW OF SYSTEMS (Negative unless checked)  Constitutional: [] Weight loss  [] Fever  [] Chills Cardiac: [] Chest pain   [] Chest pressure   [] Palpitations   [] Shortness of breath when laying flat   [] Shortness of breath with exertion. Vascular:  [] Pain in legs with walking   [] Pain in legs at rest  [] History of DVT   [] Phlebitis   [] Swelling in legs   [] Varicose veins   [] Non-healing ulcers Pulmonary:   [] Uses home oxygen   [] Productive cough   [] Hemoptysis   [] Wheeze  [] COPD   [] Asthma Neurologic:  [] Dizziness   [] Seizures   [] History of stroke   [] History of TIA  [] Aphasia   [] Vissual changes   [] Weakness or numbness in arm   [] Weakness or numbness in leg Musculoskeletal:   [] Joint swelling   [] Joint pain   [] Low back pain Hematologic:  [] Easy bruising  [] Easy bleeding   [] Hypercoagulable state   [] Anemic Gastrointestinal:  [] Diarrhea   [] Vomiting  [x] Gastroesophageal reflux/heartburn   [] Difficulty swallowing. Genitourinary:  [x] Chronic kidney disease   [] Difficult urination  [] Frequent urination   [] Blood in urine Skin:  [] Rashes   [] Ulcers  Psychological:  [] History of anxiety   []  History of major depression.  Physical Examination  Vitals:   06/06/20 1017  BP: 103/66  Pulse: 80  Resp: 16  Weight: 262 lb 9.6 oz (119.1 kg)   Body mass index is 35.61 kg/m. Gen: WD/WN, NAD Head: [place]/AT, No temporalis wasting.  Ear/Nose/Throat: Hearing grossly intact, nares w/o erythema or drainage Eyes: PER, EOMI, sclera nonicteric.  Neck: Supple, no large masses.   Pulmonary:  Good air movement, no audible wheezing bilaterally, no use of accessory muscles.  Cardiac: RRR, no JVD Vascular: Left basilic transposition good thrill good bruit Vessel Right Left  Radial Palpable Palpable  Brachial Palpable Palpable  Carotid Palpable Palpable  Gastrointestinal: Non-distended. No  guarding/no peritoneal signs.  Musculoskeletal: M/S 5/5 throughout.  No deformity or atrophy.  Neurologic: CN 2-12 intact. Symmetrical.  Speech is fluent. Motor exam as listed above. Psychiatric: Judgment intact, Mood & affect appropriate for pt's clinical situation.   CBC Lab Results  Component Value Date   WBC 6.2 02/16/2020   HGB 12.6 (L) 02/16/2020   HCT 37.1 (L) 02/16/2020   MCV 94.6 02/16/2020   PLT 157 02/16/2020    BMET    Component Value Date/Time   NA 138 02/16/2020 1434   NA 142 01/02/2019 1042   K 4.7 02/23/2020 0644   CL 106 02/16/2020 1434   CO2 21 (L) 02/16/2020 1434   GLUCOSE 109 (H)  02/16/2020 1434   BUN 78 (H) 02/16/2020 1434   BUN 54 (A) 09/27/2019 0000   CREATININE 6.46 (H) 02/16/2020 1434   CALCIUM 9.6 02/16/2020 1434   GFRNONAA 8 (L) 02/16/2020 1434   GFRAA 9 (L) 02/16/2020 1434   CrCl cannot be calculated (Patient's most recent lab result is older than the maximum 21 days allowed.).  COAG Lab Results  Component Value Date   INR 3.2 (H) 06/03/2020   INR 2.2 (H) 05/02/2020   INR 2.6 (H) 04/02/2020    Radiology No results found.   Assessment/Plan 1. Complication of vascular access for dialysis, sequela Recommend:  The patient is doing well and currently has adequate dialysis access.  The patient's dialysis center is not reporting that he is nearing initiation of hemodialysis.    Flow pattern is stable when compared to the prior ultrasound however there is an area in the mid graft that shows a mild irregularity or stenosis which should be followed.  The patient should have a duplex ultrasound of the dialysis access in 12 months as he is not on dialysis yet and so his fistula is not being accessed on a routine basis. The patient will follow-up with me in the office after each ultrasound   - VAS [place] [facility] (AVF, AVG); Future  2. CKD stage 4 due to type 2 diabetes mellitus ([place]) At the present time the patient has adequate  dialysis access.  Continue nephrology follow-up as ordered without interruption.  Avoid nephrotoxic medications and dehydration.  Further plans per nephrology  3. Essential hypertension Continue antihypertensive medications as already ordered, these medications have been reviewed and there are no changes at this time.   4. Chronic atrial fibrillation (HCC) Continue antiarrhythmia medications as already ordered, these medications have been reviewed and there are no changes at this time.  Continue anticoagulation as ordered by Cardiology Service   5. Type 2 diabetes mellitus without complication, without long-term current use of insulin (HCC) Continue hypoglycemic medications as already ordered, these medications have been reviewed and there are no changes at this time.  Hgb A1C to be monitored as already arranged by primary service     Hortencia Pilar, MD  06/06/2020 10:18 AM

## 2020-06-21 IMAGING — US ULTRASOUND SCROTUM DOPPLER COMPLETE
1 series · 13 of 25 positions shown · non-contrast
Comparison: Ultrasound February 22, 2018

CLINICAL DATA: Undescended testicles

EXAM:
SCROTAL ULTRASOUND
DOPPLER ULTRASOUND OF THE TESTICLES
TECHNIQUE: Complete ultrasound examination of the testicles, epididymis, and
other scrotal structures was performed. Color and spectral Doppler
ultrasound were also utilized to evaluate blood flow to the
testicles.

[Series 1: ultrasound scrotum doppler complete · 0.07mm/px · 13 of 52 slices shown]
[im 1/52]
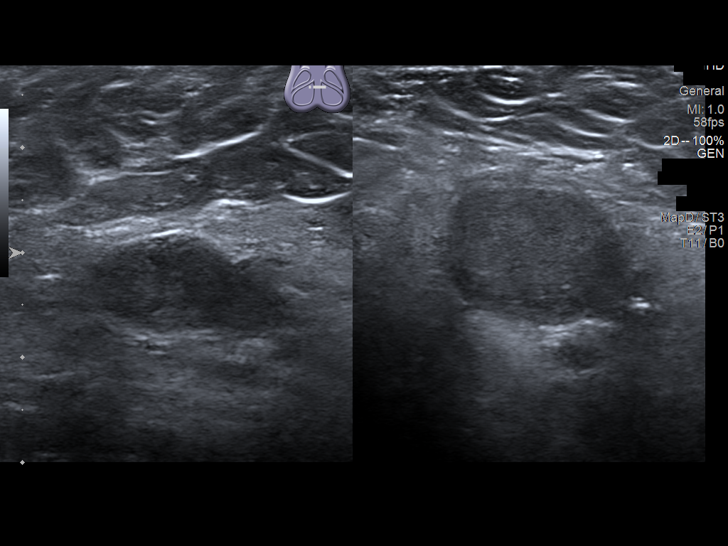
[im 5/52]
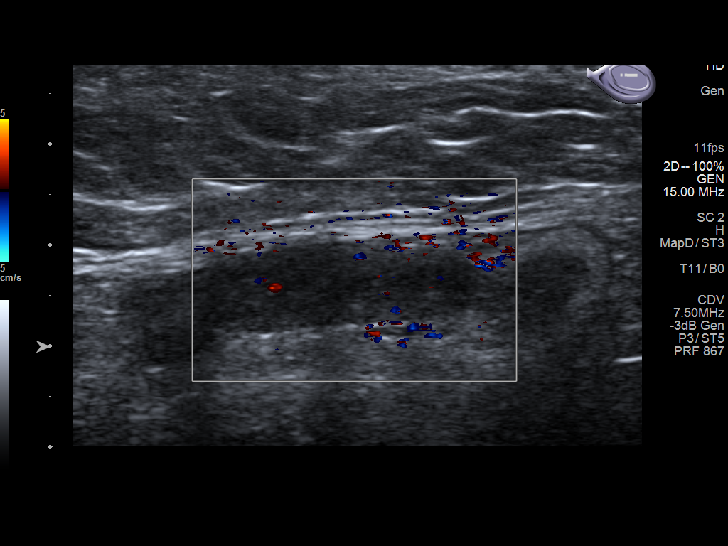
[im 9/52]
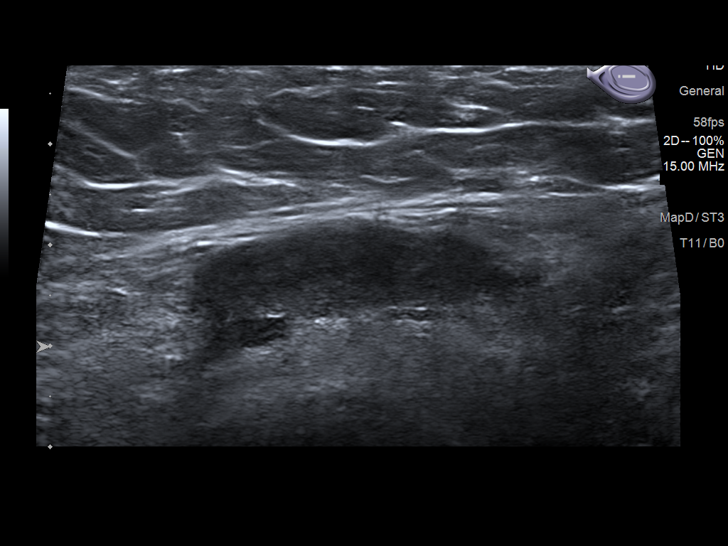
[im 13/52]
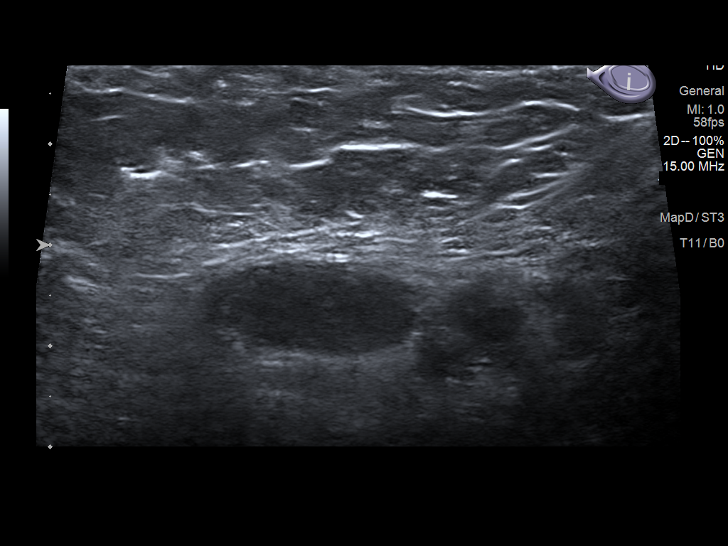
[im 18/52]
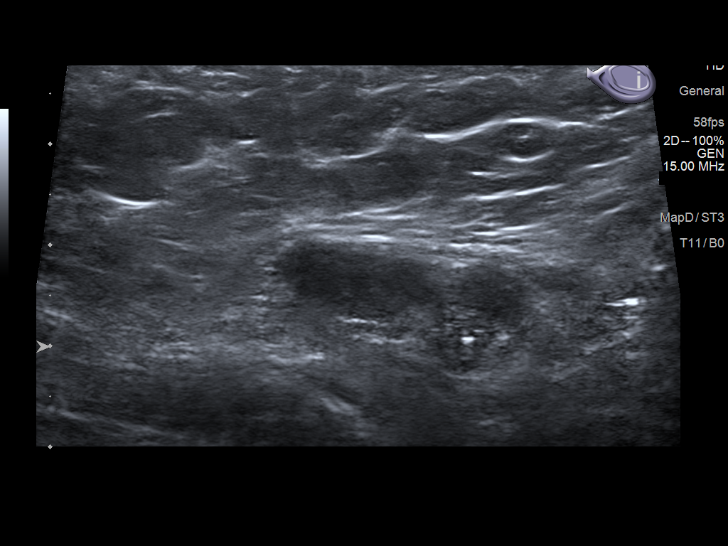
[im 22/52]
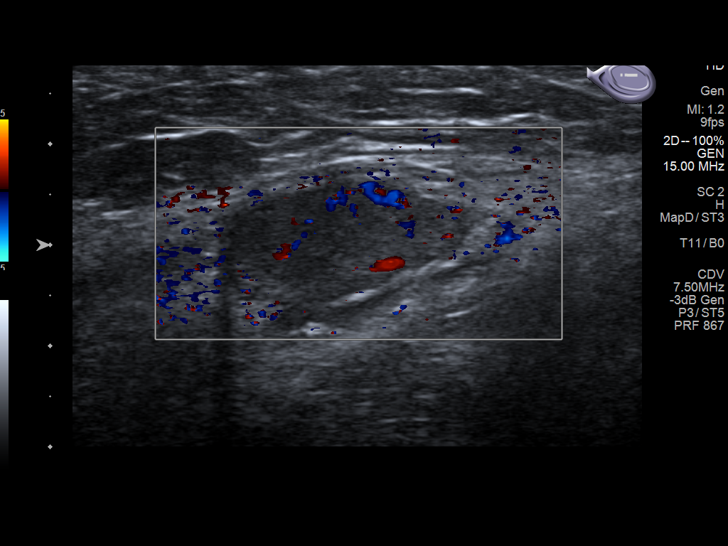
[im 26/52]
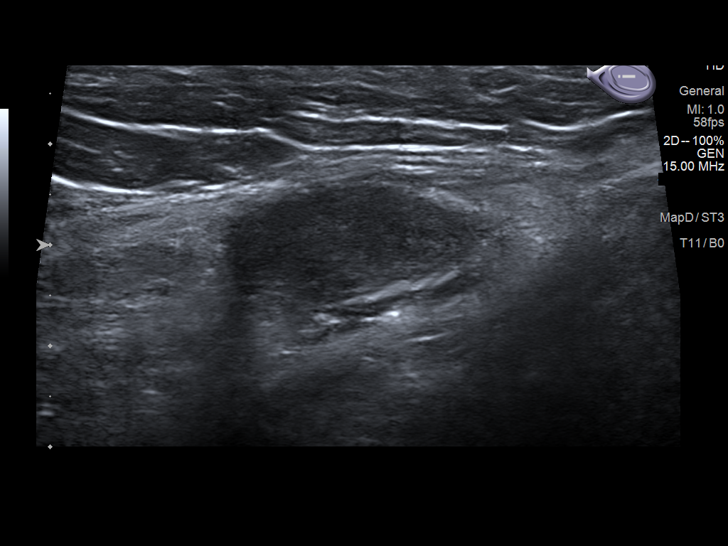
[im 30/52]
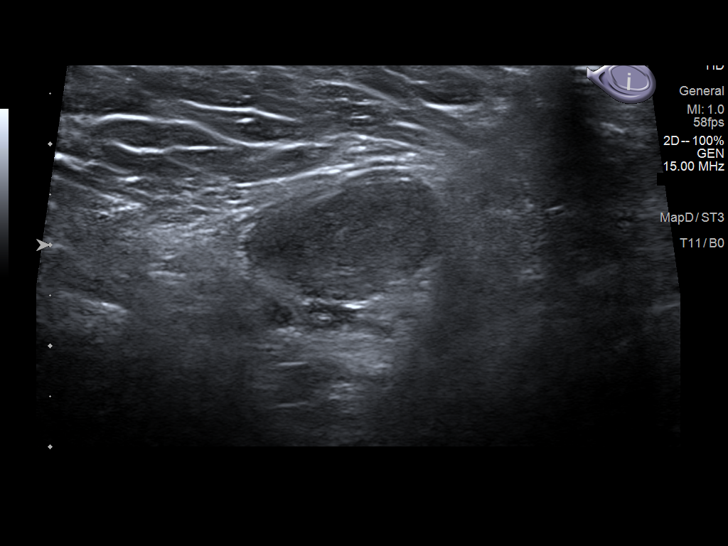
[im 35/52]
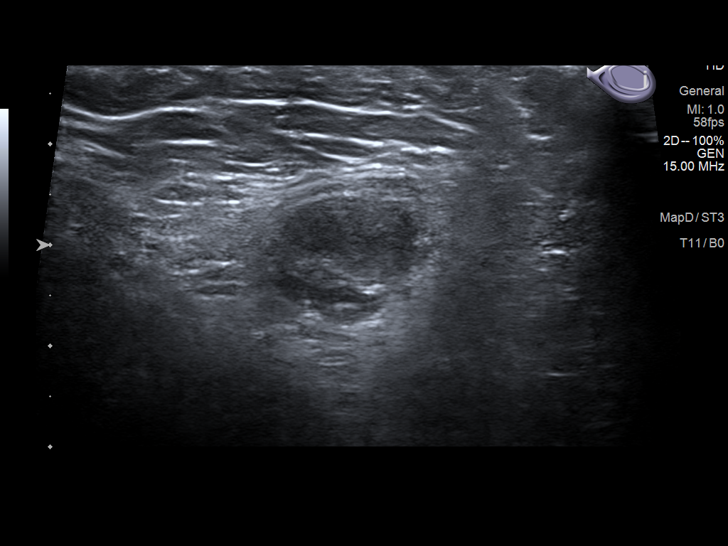
[im 39/52]
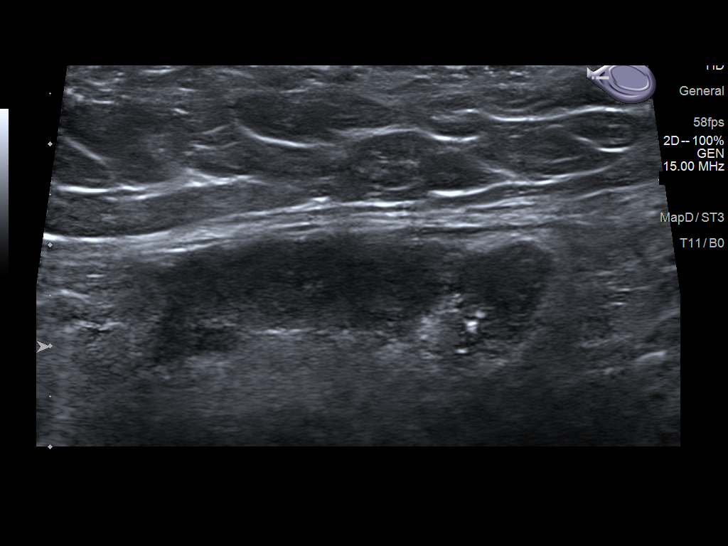
[im 43/52]
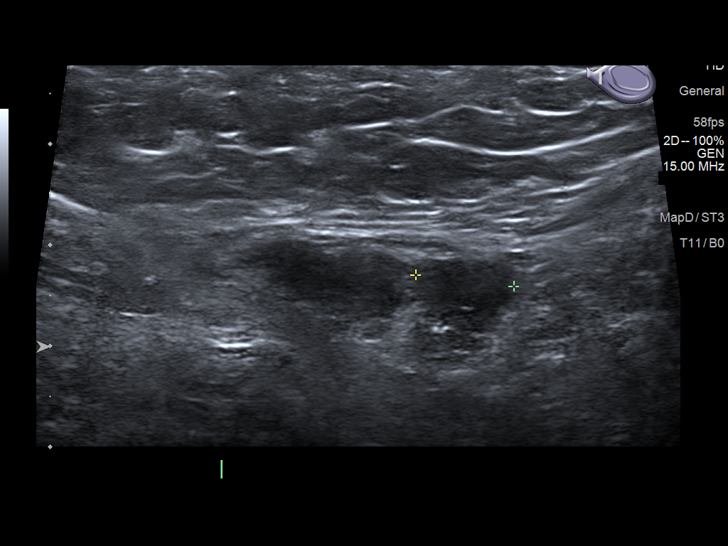
[im 47/52]
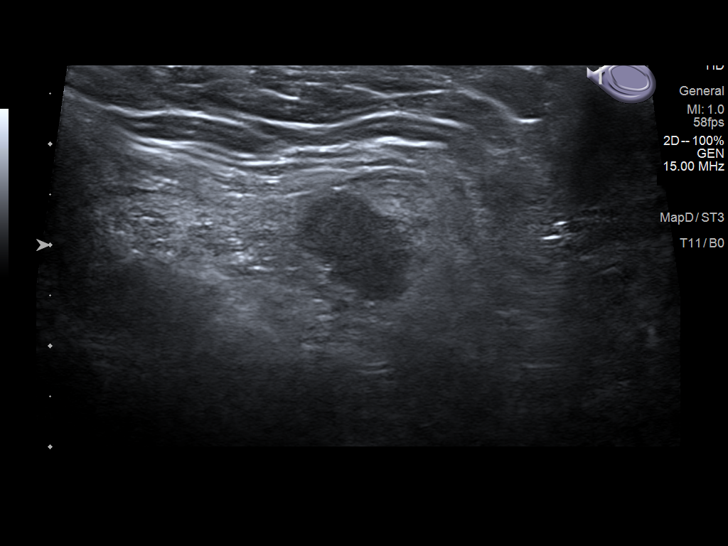
[im 52/52]
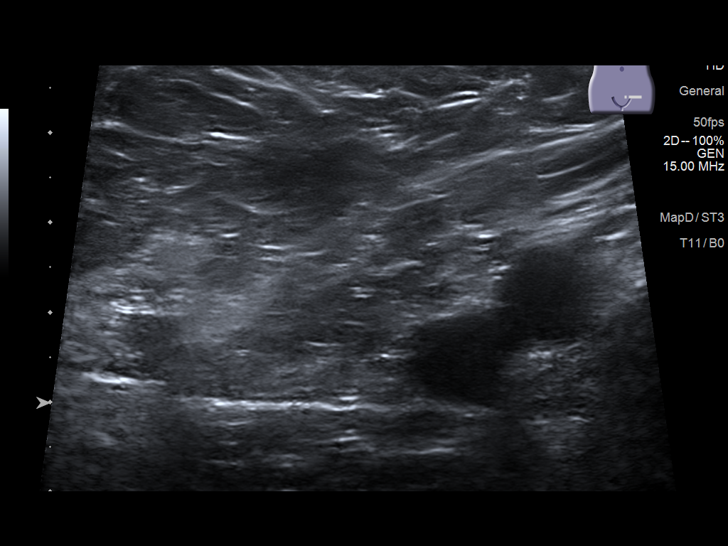

[13 of 25 positions shown; findings below may reference images not displayed]

FINDINGS: Right testicle

Measurements: 2.9 x 0.9 x 2.0 cm. Right testicle is positioned
within the inguinal canal. No mass or microlithiasis visualized.

Left testicle

Measurements: 2.7 x 1.2 x 1.9 cm. Left testicle is positioned within
the inguinal canal. No mass or microlithiasis visualized.

Right epididymis:  Normal in size and appearance.

Left epididymis:  Normal in size and appearance.

Hydrocele:  None visualized.

Varicocele:  None visualized.

Pulsed Doppler interrogation of both testes demonstrates normal low
resistance arterial waveforms. Isolated venous waveforms were
difficult to obtain.
IMPRESSION: Both testes are position within the inguinal canals. No testicular
masses are identified.

Normal arterial waveforms are visualized in both testes. Isolated
venous waveforms are difficult to ascertain given high positioning.

## 2020-06-24 ENCOUNTER — Encounter: Payer: Self-pay | Admitting: Family Medicine

## 2020-06-24 ENCOUNTER — Ambulatory Visit (INDEPENDENT_AMBULATORY_CARE_PROVIDER_SITE_OTHER): Payer: Medicare Other | Admitting: Family Medicine

## 2020-06-24 ENCOUNTER — Other Ambulatory Visit: Payer: Self-pay

## 2020-06-24 VITALS — BP 130/62 | HR 72 | Ht 72.0 in | Wt 273.0 lb

## 2020-06-24 DIAGNOSIS — M67442 Ganglion, left hand: Secondary | ICD-10-CM | POA: Diagnosis not present

## 2020-06-24 DIAGNOSIS — J0100 Acute maxillary sinusitis, unspecified: Secondary | ICD-10-CM | POA: Diagnosis not present

## 2020-06-24 MED ORDER — AZITHROMYCIN 250 MG PO TABS: ORAL_TABLET | ORAL | 0 refills | Status: DC

## 2020-06-24 NOTE — Progress Notes (Signed)
Date:  06/24/2020   Name:  Mark Bauer   DOB:  1955-12-16   MRN:  [id number]   Chief Complaint: Sinusitis (gets worse at night. no production, just pressure and cough) and knot on hand (tender to touch on top of L) hand x 2 weeks)  Sinusitis This is a new problem. The current episode started in the past 7 days. The problem has been gradually improving since onset. There has been no fever. The pain is mild. Associated symptoms include congestion and sinus pressure. Pertinent negatives include no chills, coughing, diaphoresis, ear pain, headaches, hoarse voice, neck pain, shortness of breath, sneezing, sore throat or swollen glands. Past treatments include acetaminophen. The treatment provided mild relief.  Hand Pain  The incident occurred more than 1 week ago. There was no injury mechanism. The pain is present in the left hand (dorsum). Pain severity now: tender. Pertinent negatives include no chest pain, muscle weakness, numbness or tingling. Nothing aggravates the symptoms. He has tried acetaminophen for the symptoms.    Lab Results  Component Value Date   CREATININE 6.46 (H) 02/16/2020   BUN 78 (H) 02/16/2020   NA 138 02/16/2020   K 4.7 02/23/2020   CL 106 02/16/2020   CO2 21 (L) 02/16/2020   Lab Results  Component Value Date   CHOL 100 05/02/2020   HDL 34 (L) 05/02/2020   LDLCALC 55 05/02/2020   TRIG 42 05/02/2020   CHOLHDL 3.7 12/16/2016   Lab Results  Component Value Date   TSH 3.040 01/09/2019   Lab Results  Component Value Date   HGBA1C 6.1 (H) 05/02/2020   Lab Results  Component Value Date   WBC 6.2 02/16/2020   HGB 12.6 (L) 02/16/2020   HCT 37.1 (L) 02/16/2020   MCV 94.6 02/16/2020   PLT 157 02/16/2020   Lab Results  Component Value Date   ALT 34 06/09/2019   AST 26 06/09/2019   ALKPHOS 75 06/09/2019   BILITOT 0.5 06/09/2019     Review of Systems  Constitutional: Negative for chills, diaphoresis and fever.  HENT: Positive for congestion and  sinus pressure. Negative for drooling, ear discharge, ear pain, hoarse voice, sneezing and sore throat.   Respiratory: Negative for cough, shortness of breath and wheezing.   Cardiovascular: Negative for chest pain, palpitations and leg swelling.  Gastrointestinal: Negative for abdominal pain, blood in stool, constipation, diarrhea and nausea.  Endocrine: Negative for polydipsia.  Genitourinary: Negative for dysuria, frequency, hematuria and urgency.  Musculoskeletal: Negative for back pain, myalgias and neck pain.  Skin: Negative for rash.  Allergic/Immunologic: Negative for environmental allergies.  Neurological: Negative for dizziness, tingling, numbness and headaches.  Hematological: Does not bruise/bleed easily.  Psychiatric/Behavioral: Negative for suicidal ideas. The patient is not nervous/anxious.     Patient Active Problem List   Diagnosis Date Noted  . Complication of vascular access for dialysis 06/06/2020  . 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 Polycystic liver disease 01/10/2020  . Polycystic kidney disease 03/01/2019  . Metabolic acidosis 65/46/5035  . CKD stage 4 due to type 2 diabetes mellitus ([place]) 01/25/2018  . Type 2 diabetes mellitus with diabetic nephropathy, without long-term current use of insulin ([place]) 01/25/2018  . Secondary hyperparathyroidism of renal origin ([place]) 08/19/2017  . Chronic atrial fibrillation ([place]) 01/06/2017  . Type 2 diabetes mellitus without complication, without long-term current use of insulin ([place]) 01/06/2017  . Essential hypertension 01/06/2017  . Hyperuricemia 01/06/2017  . Mixed hyperlipidemia 01/06/2017  . Gastroesophageal reflux disease 01/06/2017  .  Obesity 12/26/2012    Allergies  Allergen Reactions  . Ciprofloxacin Other (See Comments)    hallucination  . Pollen Extract Other (See Comments)    Sneezing, watery eyes, etc.  . Caffeine Palpitations    palpatations    Past Surgical History:  Procedure Laterality Date  . BASCILIC VEIN  TRANSPOSITION Left 02/23/2020   Procedure: BASCILIC VEIN TRANSPOSITION (SINGLE STAGE);  Surgeon: Katha Cabal, MD;  Location: [facility] ORS;  Service: Vascular;  Laterality: Left;  . COLONOSCOPY  2012   hemorrhoids  . melanoma removal     forehead    Social History   Tobacco Use  . Smoking status: Never Smoker  . Smokeless tobacco: Never Used  Vaping Use  . Vaping Use: Never used  Substance Use Topics  . Alcohol use: No    Alcohol/week: 0.0 standard drinks  . Drug use: No     Medication list has been reviewed and updated.  No outpatient medications have been marked as taking for the 06/24/20 encounter (Office Visit) with Juline Patch, MD.    [facility] 2/9 Scores 06/24/2020 05/02/2020 01/10/2020 01/09/2019  PHQ - 2 Score 0 0 0 0  PHQ- 9 Score 0 0 - -    GAD 7 : Generalized Anxiety Score 06/24/2020 05/02/2020  Nervous, Anxious, on Edge 0 0  Control/stop worrying 0 0  Worry too much - different things 0 0  Trouble relaxing 0 0  Restless 0 0  Easily annoyed or irritable 0 0  Afraid - awful might happen 0 0  Total GAD 7 Score 0 0    BP Readings from Last 3 Encounters:  06/24/20 130/62  06/06/20 103/66  05/02/20 120/62    Physical Exam Vitals and nursing note reviewed.  HENT:     Head: Normocephalic.     Right Ear: Tympanic membrane, ear canal and external ear normal. There is no impacted cerumen.     Left Ear: Tympanic membrane, ear canal and external ear normal. There is no impacted cerumen.     Nose: No congestion or rhinorrhea.     Right Sinus: No maxillary sinus tenderness or frontal sinus tenderness.     Left Sinus: Maxillary sinus tenderness present. No frontal sinus tenderness.     Mouth/Throat:     Lips: Pink.     Mouth: Mucous membranes are moist.     Dentition: Normal dentition.  Eyes:     General: No scleral icterus.       Right eye: No discharge.        Left eye: No discharge.     Conjunctiva/sclera: Conjunctivae normal.     Pupils: Pupils are equal,  round, and reactive to light.  Neck:     Thyroid: No thyromegaly.     Vascular: No JVD.     Trachea: No tracheal deviation.  Cardiovascular:     Rate and Rhythm: Normal rate and regular rhythm.     Heart sounds: Normal heart sounds. No murmur heard.  No friction rub. No gallop.   Pulmonary:     Effort: No respiratory distress.     Breath sounds: Normal breath sounds. No wheezing or rales.  Chest:     Chest wall: No tenderness.  Abdominal:     General: Bowel sounds are normal.     Palpations: Abdomen is soft. There is no mass.     Tenderness: There is no abdominal tenderness. There is no guarding or rebound.  Musculoskeletal:  General: Swelling, tenderness and deformity present.     Right hand: Swelling and tenderness present. Decreased range of motion.     Cervical back: Normal range of motion and neck supple.     Comments: Unable to make fist  Lymphadenopathy:     Cervical: No cervical adenopathy.  Skin:    General: Skin is warm.     Findings: No rash.  Neurological:     Mental Status: He is alert and oriented to person, place, and time.     Cranial Nerves: No cranial nerve deficit.     Deep Tendon Reflexes: Reflexes are normal and symmetric.     Wt Readings from Last 3 Encounters:  06/24/20 273 lb (123.8 kg)  06/06/20 262 lb 9.6 oz (119.1 kg)  05/02/20 257 lb (116.6 kg)    BP 130/62   Pulse 72   Ht 6' (1.829 m)   Wt 273 lb (123.8 kg)   BMI 37.03 kg/m   Assessment and Plan: 1. Ganglion cyst of finger of left hand New onset.  Persistent.  Symptomatic with discomfort and decreased range of motion specifically making a fist.  Sudden onset that developed on the dorsum of the left hand.  There is some slight discoloration to suggest may be that this is of a bleed nature and I am wondering if this is a hematoma with an an extensor tendon versus cystlike concern.  Will refer to orthopedic surgery for evaluation. - Ambulatory referral to Orthopedic Surgery  2.  Acute maxillary sinusitis, recurrence not specified Acute.  Persistent.  Stable.  Will initiate a azithromycin 250 mg 2 today followed by 1 a day for 4 days. - azithromycin (ZITHROMAX) 250 MG tablet; 2 today then 1 a day for 4 days  Dispense: 6 tablet; Refill: 0

## 2020-07-03 DIAGNOSIS — S60222A Contusion of left hand, initial encounter: Secondary | ICD-10-CM | POA: Diagnosis not present

## 2020-07-03 DIAGNOSIS — E669 Obesity, unspecified: Secondary | ICD-10-CM | POA: Diagnosis not present

## 2020-07-03 DIAGNOSIS — E119 Type 2 diabetes mellitus without complications: Secondary | ICD-10-CM | POA: Diagnosis not present

## 2020-07-04 DIAGNOSIS — Z23 Encounter for immunization: Secondary | ICD-10-CM | POA: Diagnosis not present

## 2020-07-08 ENCOUNTER — Telehealth: Payer: Self-pay

## 2020-07-08 ENCOUNTER — Other Ambulatory Visit: Payer: Medicare Other

## 2020-07-08 ENCOUNTER — Other Ambulatory Visit: Payer: Self-pay

## 2020-07-08 DIAGNOSIS — I482 Chronic atrial fibrillation, unspecified: Secondary | ICD-10-CM | POA: Diagnosis not present

## 2020-07-08 NOTE — Telephone Encounter (Signed)
Pt will come to office today to pick up lab order

## 2020-07-08 NOTE — Telephone Encounter (Unsigned)
Copied from [place] [id number]. Topic: General - Other >> Jul 08, 2020  8:18 AM Leward Quan A wrote: Reason for CRM: Patient called in he is heading to [place] need order faxed over for PT INR please patient does not want to have to drive to [place] and then back to [facility] to get this done. Any questions please call Ph# [phone]

## 2020-07-08 NOTE — Telephone Encounter (Signed)
Put [place] on labs only and hit arrived

## 2020-07-08 NOTE — Telephone Encounter (Signed)
Please tell him we don't fax to [facility] as we have tried this process in the past and they lose the orders

## 2020-07-09 LAB — PROTIME-INR
INR: 5 (ref 0.9–1.2)
Prothrombin Time: 48.9 s — ABNORMAL HIGH (ref 9.1–12.0)

## 2020-07-23 ENCOUNTER — Other Ambulatory Visit: Payer: Medicare Other

## 2020-07-23 ENCOUNTER — Telehealth: Payer: Self-pay

## 2020-07-23 ENCOUNTER — Other Ambulatory Visit: Payer: Self-pay

## 2020-07-23 DIAGNOSIS — I482 Chronic atrial fibrillation, unspecified: Secondary | ICD-10-CM | POA: Diagnosis not present

## 2020-07-23 NOTE — Telephone Encounter (Signed)
Put on sched and hit arrived

## 2020-07-23 NOTE — Telephone Encounter (Unsigned)
Copied from [place] [id number]. Topic: General - Other >> Jul 23, 2020  7:39 AM Mark Bauer wrote: PT need orders to be place for PT INR / please advise

## 2020-07-24 LAB — PROTIME-INR
INR: 1.7 — ABNORMAL HIGH (ref 0.9–1.2)
Prothrombin Time: 17.4 s — ABNORMAL HIGH (ref 9.1–12.0)

## 2020-07-30 ENCOUNTER — Ambulatory Visit: Payer: Medicare Other | Admitting: Pharmacist

## 2020-07-30 ENCOUNTER — Encounter: Payer: Self-pay | Admitting: Urology

## 2020-07-30 DIAGNOSIS — I482 Chronic atrial fibrillation, unspecified: Secondary | ICD-10-CM

## 2020-07-30 DIAGNOSIS — E1121 Type 2 diabetes mellitus with diabetic nephropathy: Secondary | ICD-10-CM

## 2020-07-30 NOTE — Chronic Care Management (AMB) (Signed)
Chronic Care Management Pharmacy  Name: Mark [facility]  MRN: [id number] DOB: 1956-05-21   Chief Complaint/ HPI  Mark [facility],  65 y.o. , male presents for their Initial CCM visit with the clinical pharmacist via telephone.  PCP : Mark Patch, MD Patient Care Team: Mark Patch, MD as PCP - General (Family Medicine) Mark [facility], [facility] (Pharmacist)  Their chronic conditions include: Hypertension, Hyperlipidemia, Diabetes, Atrial Fibrillation, GERD and Chronic Kidney Disease, Gout  Office Visits: 06/24/20- Dr. Charlestine [facility] and L hand cyst refer to ortho, z-pak 05/02/20- Dr. Ronnald [facility]- blood work  Consult Visit: 06/23/20- L hand traumatic hematoma? Heat, ice for swelling 06/06/20- Dr.Schnier- Vascular- Duplex US for AV access 05/29/20-L Mark [facility], ANP- Nephrology- plan for in [facility], continue BP meds 03/07/20- Dr. Delana [facility], Vascular- postop check AVF/AVG access (Ultrasound 11/18) 8/3//21- Dr. Percell [facility], Nephrology - referral to transplant, labwork, refill Toprol XL and spironolactone   Allergies  Allergen Reactions   Ciprofloxacin Other (See Comments)    hallucination   Pollen Extract Other (See Comments)    Sneezing, watery eyes, etc.   Caffeine Palpitations    palpatations    Medications: Outpatient Encounter Medications as of 07/30/2020  Medication Sig   allopurinol (ZYLOPRIM) 100 MG tablet Take 1 tablet (100 mg total) by mouth daily.   amLODipine (NORVASC) 10 MG tablet Take 1 tablet (10 mg total) by mouth daily.   azithromycin (ZITHROMAX) 250 MG tablet 2 today then 1 a day for 4 days   b complex vitamins tablet Take 1 tablet by mouth daily.   Cholecalciferol (D3 SUPER STRENGTH) 2000 units CAPS Take 2,000 Units by mouth daily.    DHA-EPA-VIT B6-B12-FOLIC ACID PO Take 3,559 Units by mouth daily.   liraglutide (VICTOZA) 18 MG/3ML SOPN INJECT SUBCUTANEOUSLY 1.8MG ONCE A DAY   loratadine (CLARITIN) 10 MG tablet Take 10 mg by mouth at bedtime. otc    lovastatin (MEVACOR) 20 MG tablet Take 1 tablet (20 mg total) by mouth daily.   metoprolol succinate (TOPROL-XL) 100 MG 24 hr tablet Take 1 tablet (100 mg total) by mouth daily. Take with or immediately following a meal.   NOVOFINE PEN NEEDLE 32G X 6 MM MISC USE AND DISCARD 1 PEN      NEEDLE SUBCUTANEOUSLY DAILYAS DIRECTED   pantoprazole (PROTONIX) 40 MG tablet Take 1 tablet (40 mg total) by mouth daily.   spironolactone (ALDACTONE) 25 MG tablet Take 25 mg by mouth daily.   torsemide (DEMADEX) 10 MG tablet Take 10 mg by mouth daily as needed (ankle swelling/fluid retention.). Takes  For swelling has not needed   warfarin (COUMADIN) 2 MG tablet Take 2 tablets (4 mg total) by mouth daily.   warfarin (JANTOVEN) 5 MG tablet Take 1 tablet (5 mg total) by mouth daily.   No facility-administered encounter medications on file as of 07/30/2020.    Wt Readings from Last 3 Encounters:  06/24/20 273 lb (123.8 kg)  06/06/20 262 lb 9.6 oz (119.1 kg)  05/02/20 257 lb (116.6 kg)    Current Diagnosis/Assessment:    Goals Addressed   None     Diabetes   A1c goal <7%  Recent Relevant Labs: Lab Results  Component Value Date/Time   HGBA1C 6.1 (H) 05/02/2020 08:49 AM   HGBA1C 6.0 (H) 12/07/2019 10:26 AM    Last diabetic Eye exam:  Lab Results  Component Value Date/Time   HMDIABEYEEXA No Retinopathy 03/15/2020 12:00 AM    Last diabetic Foot exam: No results found for: HMDIABFOOTEX  Checking BG: 3 times weekly  Recent FBG Readings: <120, occassionally  Patient has failed these meds in past: NA Patient is currently controlled on the following medications:  Victoza 1.8 mg qd  We discussed: diet and exercise extensively and how to recognize and treat signs of hypoglycemia.Denies any episodes of hypoglycemia. Patient is very satisfied with Victoza and denies any adverse effects. Financial strain of copayment is his main concern. Patient reports trouble finding test strips for his  meter which is ~ 67 years old. Will pursue PAP for Victoza and collaborate with PCP to send RX for new meter, strips and lancets.  Plan  Continue current medications. Pursue patient assistance for Victoza.  AFIB   Patient is currently rate controlled. Office heart rates are  Pulse Readings from Last 3 Encounters:  06/24/20 72  06/06/20 80  05/02/20 72   05/02/20 INR 2.2 PT 22.5   02/07/20 H/H=12.6/37.1 plt 157  CHA2DS2-VASc Score = 3  The patient's score is based upon: CHF History: No HTN History: Yes Diabetes History: Yes Stroke History: No Vascular Disease History: No Age Score: 1 Gender Score: 0  { Patient has failed these meds in past: NA Patient is currently controlled on the following medications:   Warfarin 4 mg daily  Metoprolol succinate 100 mg qd  Torsemide 10 mg prn swelling (patient reports not needing)  We discussed:   Reviewed signs and symptoms of bleeding - patient denies. We discussed DOAC therapy which patient states has been cost prohibitive.  Will revisit copay at that time. Patient does not follow with cardiology. Patient has new Part D plan and we discussed the option of switching to a newer agent and pursuing patient assistance. Patient wishes to continue with warfarin at this time. Most recent INR 1.7 07/24/19, 5.0 on 07/08/20 hold x2 and restart 4 mg qd. Patient denies any dietary of lifestyle changes at time of elevated INR. Patient has not started any OTC medications.  Plan  Continue current medications.   Hypertension/CKDIV   BP goal is:  <130/80  Office blood pressures are  BP Readings from Last 3 Encounters:  06/24/20 130/62  06/06/20 103/66  05/02/20 120/62   BMP Latest Ref Rng & Units 02/23/2020 02/16/2020 09/27/2019  Glucose 70 - 99 mg/dL - 109(H) -  BUN 8 - 23 mg/dL - 78(H) 54(A)  Creatinine 0.61 - 1.24 mg/dL - 6.46(H) 4.2(A)  BUN/Creat Ratio 10 - 24 - - -  Sodium 135 - 145 mmol/L - 138 -  Potassium 3.5 - 5.1 mmol/L 4.7 5.1 -   Chloride 98 - 111 mmol/L - 106 -  CO2 22 - 32 mmol/L - 21(L) -  Calcium 8.9 - 10.3 mg/dL - 9.6 -    Patient checks BP at home 3-5x per week Patient home BP readings are ranging: 100/60-70s  Patient has failed these meds in the past: Triamterene/HCTZ, ARB discontinued secondary to worsening kidney function by nephrology. Patient is currently controlled on the following medications:   Spironolactone 25 mg qd  Metoprolol succinate 100 mg qd  Amlodipine 10 mg qd  Torsemide 10 mg qd prn swelling ( not needing currently)  We discussed Patient has noticed lower BP since nephrology changed to spironolactone. He denies any dizziness, lightheadedness or fatigue. Patient recently had AVF/AVG acces in preparation of dialysis in future. AV access deemed patent 06/06/20. Planning for [facility] but has not yet started.  Plan  Continue current medications     Hyperlipidemia   LDL goal <  70  Last lipids Lab Results  Component Value Date   CHOL 100 05/02/2020   HDL 34 (L) 05/02/2020   LDLCALC 55 05/02/2020   TRIG 42 05/02/2020   CHOLHDL 3.7 12/16/2016   Hepatic Function Latest Ref Rng & Units 06/09/2019 01/09/2019 01/02/2019  Total Protein 6.0 - 8.5 g/dL 7.1 - 6.2  Albumin 3.8 - 4.8 g/dL 4.5 4.5 4.2  AST 0 - 40 IU/L '26 18 16  ' ALT 0 - 44 IU/L 34 18 17  Alk Phosphatase 39 - 117 IU/L 75 87 75  Total Bilirubin 0.0 - 1.2 mg/dL 0.5 0.6 0.5  Bilirubin, Direct 0.00 - 0.40 mg/dL 0.14 0.18 0.16     The ASCVD Risk score ([place]., et al., 2013) failed to calculate for the following reasons:   The valid total cholesterol range is 130 to 320 mg/dL   Patient has failed these meds in past: NA Patient is currently controlled on the following medications:   Lovastatin 20 mg qd  We discussed:  Diet and exercise extensively.  Plan  Continue current medications  GERD   Patient has failed these meds in past: NA Patient is currently controlled on the following medications:    Pantoprazole 40 mg qd  Will discuss at follow up visit.  Plan  Continue current medications  Hyperuricemia/ Gout   Uric Acid level 6.0 02/06/20  LFT's WNL 110/20/20 Patient has failed these meds in past: NA  Patient is currently controlled on the following medications:   Allopurinol 100 mg qd  We discussed:  Patient is tolerating dose. Uric acid level checked at Nephrology. No recent flares.  Denies any adverse effects; no rash, pruritis.    Plan  Continue current medications   Vaccines   Reviewed and discussed patient's vaccination history.    Immunization History  Administered Date(s) Administered   Influenza, High Dose Seasonal PF 05/04/2019   Influenza,inj,Quad PF,6+ Mos 04/23/2017, 05/02/2018   Influenza-Unspecified 05/04/2019, 04/29/2020   PFIZER SARS-COV-2 Vaccination 09/15/2019, 10/11/2019   Pneumococcal Conjugate-13 06/09/2019   Pneumococcal Polysaccharide-23 12/27/2015   Tdap 07/26/2017   Patient received COVID. Plan  Recommended patient receive Shingrix vaccine.      Health Maintenance     Vitamin D3 2000 units daily  B complex daily    Will discuss at follow up. Patient states levels followed by Nephrology.  Plan  Continue current medications    Medication Management   Pt uses [facility] for all medications Uses pill box? Yes Pt endorses 99% compliance  We discussed: Discussed benefits of medication synchronization, packaging and delivery as well as enhanced pharmacist oversight with [employer]. Patient is changing Part d plans in January. He also get funding from [employer] benefits program to be used at their approved affiliates. He would like to revisit after new coverage in place.  Plan  Continue current medication management strategy    Follow up: 3 month phone visit  Junita Push. Kenton Kingfisher PharmD, [facility] [phone]

## 2020-08-08 ENCOUNTER — Telehealth: Payer: Self-pay | Admitting: Pharmacist

## 2020-08-08 NOTE — Patient Instructions (Addendum)
Visit Information  It was a pleasure speaking with you today. Thank you for letting me be part of your clinical team. Please call with any questions or concerns.   Goals Addressed            This Visit's Progress   . Pharmacy care plan       CARE PLAN ENTRY (see longitudinal plan of care for additional care plan information)  Current Barriers:  . Chronic Disease Management support, education, and care coordination needs related to Hypertension, Hyperlipidemia, Diabetes, Atrial Fibrillation, GERD, and Gout   Hypertension/A fib BP Readings from Last 3 Encounters:  05/02/20 120/62  03/07/20 106/69  02/23/20 120/61   . Pharmacist Clinical Goal(s): o Over the next 90 days, patient will work with PharmD and providers to achieve BP goal <130/80 . Current regimen:  o Warfarin 4 mg qd (adjusted based on INR) . Spironolactone 25 mg . Metoprolol succinate . Amlodipine 10 mg qd . Torsemide 10 mg qd prn swelling ( not needing currently) . Interventions: o Provided diet and exercise counseling. o Reviewed signs/symptoms of bleeding Patient self care activities - Over the next 90 days, patient will: o Check BP 3-4 times weekly, document, and provide at future appointments o Ensure daily salt intake < 2300 mg/day  Hyperlipidemia Lab Results  Component Value Date/Time   LDLCALC 55 05/02/2020 08:49 AM   . Pharmacist Clinical Goal(s): o Over the next 90 days, patient will work with PharmD and providers to maintain LDL goal < 70 . Current regimen:  o Lovastatin 20 mg qd . Interventions: o Provided diet and exercise counseling. . Patient self care activities - Over the next 90 days, patient will: o Continue heart healthy, low-sodium diet. o Attend all scheduled appointments  Diabetes Lab Results  Component Value Date/Time   HGBA1C 6.1 (H) 05/02/2020 08:49 AM   HGBA1C 6.0 (H) 12/07/2019 10:26 AM   . Pharmacist Clinical Goal(s): o Over the next 90 days, patient will work with  PharmD and providers to maintain A1c goal <7% . Current regimen:  o Victoza 1.8mg  daily  . Interventions: o Collaborate with prescriber to send in RX for new meter and supplies o Will initiate PAP for Victoza to help alleviate financial burden. . Patient self care activities - Over the next 90 days, patient will: o Check blood sugar once daily, document, and provide at future appointments o Contact provider with any episodes of hypoglycemia  Medication management . Pharmacist Clinical Goal(s): o Over the next 90 days, patient will work with PharmD and providers to maintain optimal medication adherence . Current pharmacy: [facility] . Interventions o Comprehensive medication review performed. o Continue current medication management strategy . Patient self care activities - Over the next 90 days, patient will: o Focus on medication adherence by fill dates o Take medications as prescribed o Report any questions or concerns to PharmD and/or provider(s)  Please see past updates related to this goal by clicking on the "Past Updates" button in the selected goal         The patient verbalized understanding of instructions, educational materials, and care plan provided today and agreed to receive a mailed copy of patient instructions, educational materials, and care plan.   Telephone follow up appointment with pharmacy team member scheduled for: 3 months  Junita Push. Kenton Kingfisher PharmD, BCPS Clinical Pharmacist [phone]  Food Basics for Chronic Kidney Disease Chronic kidney disease (CKD) occurs when the kidneys are permanently damaged over a long period of time.  When your kidneys are not working well, they cannot remove waste, fluids, and other substances from your blood as well as they did before. The substances can build up, which can worsen kidney damage and affect how your body functions. Certain foods lead to a buildup of these substances. By changing your diet, you can help prevent  more kidney damage and delay or prevent the need for dialysis. What are tips for following this plan? Reading food labels  Check the amount of salt (sodium) in foods. Choose foods that have less than 300 milligrams (mg) per serving.  Check the ingredient list for phosphorus or potassium-based additives or preservatives.  Check the amount of saturated fat and trans fat. Limit or avoid these fats as told by your dietitian. Shopping  Avoid buying foods that are: ? Processed or prepackaged. ? Calcium-enriched or that have calcium added to them (are fortified).  Do not buy foods that have salt or sodium listed among the first five ingredients.  Buy canned vegetables and beans that say "no salt added" or "low sodium" and rinse them before eating. Cooking  Soak vegetables, such as potatoes, before cooking to reduce potassium. To do this: 1. Peel and cut the vegetables into small pieces. 2. Soak the vegetables in warm water for at least 2 hours. For every 1 cup of vegetables, use 10 cups of water. 3. Drain and rinse the vegetables with warm water. 4. Boil the vegetables for at least 5 minutes. Meal planning  Limit the amount of protein you eat from plant and animal sources each day.  Do not add salt to food when cooking or before eating.  Eat meals and snacks at around the same time each day. General information  Talk with your health care provider about whether you should take a vitamin and mineral supplement.  Use standard measuring cups and spoons to measure servings of foods. Use a kitchen scale to measure portions of protein foods.  If told by your health care provider, avoid drinking too much fluid. Measure and count all liquids, including water, ice, soups, flavored gelatin, and frozen desserts such as ice pops or ice cream. If you have diabetes:  If you have diabetes (diabetes mellitus) and CKD, it is important to keep your blood sugar (glucose) in the target range  recommended by your health care provider. Follow your diabetes management plan. This may include: ? Checking your blood glucose regularly. ? Taking medicines by mouth, taking insulin, or taking both. ? Exercising for at least 30 minutes on 5 or more days each week, or as told by your health care provider. ? Tracking how many servings of carbohydrates you eat at each meal.  You may be given specific guidelines on how much of certain foods and nutrients you may eat, depending on your stage of kidney disease and whether you have high blood pressure (hypertension). Follow your meal plan as told by your dietitian. What nutrients should I limit? Work with your health care provider and dietitian to develop a meal plan that is right for you. Foods you can eat and foods you should limit or avoid will depend on the stage of your kidney disease and any other health conditions you have. The items listed below are not a complete list. Talk with your dietitian about what dietary choices are best for you. Potassium Potassium affects how steadily your heart beats. If too much potassium builds up in your blood, the potassium can cause an irregular heartbeat or even a  heart attack. You may need to limit or avoid foods that are high in potassium, such as:  Milk and soy milk.  Fruits, such as bananas, apricots, nectarines, melon, prunes, raisins, kiwi, and oranges.  Vegetables, such as potatoes, sweet potatoes, yams, tomatoes, leafy greens, beets, avocado, pumpkin, and winter squash.  White and lima beans.  Whole-wheat breads and pastas.  Beans and nuts. Phosphorus Phosphorus is a mineral found in your bones. A balance between calcium and phosphorus is needed to build and maintain healthy bones. Too much phosphorus pulls calcium from your bones. This can make your bones weak and more likely to break. Too much phosphorus can also make your skin itch. You may need to limit or avoid foods that are high in  phosphorus, such as:  Milk and dairy products.  Dried beans and peas.  Tofu, soy milk, and other soy-based meat replacements.  Dark-colored sodas.  Nuts and peanut butter.  Meat, poultry, and fish.  Bran cereals and oatmeal. Protein Protein helps you make and keep muscle. It also helps to repair your body's cells and tissues. One of the natural breakdown products of protein is a waste product called urea. When your kidneys are not working properly, they cannot remove wastes, such as urea. Reducing how much protein you eat can help prevent a buildup of urea in your blood. Depending on your stage of kidney disease, you may need to limit foods that are high in protein. Sources of animal protein include:  Meat (all types).  Fish and seafood.  Poultry.  Eggs.  Dairy. Other protein foods include:  Beans and legumes.  Nuts and nut butter.  Soy and tofu.   Sodium Sodium helps to maintain a healthy balance of fluids in your body. Too much sodium can increase your blood pressure and have a negative effect on your heart and lungs. Too much sodium can also cause your body to retain too much fluid, making your kidneys work harder. Most people should have less than 2,300 mg of sodium each day. If you have hypertension, you may need to limit your sodium to 1,500 mg each day. You may need to limit or avoid foods that are high in sodium, such as:  Salt seasonings.  Soy sauce.  Cured and processed meats.  Salted crackers and snack foods.  Fast food.  Canned soups and most canned foods.  Pickled foods.  Vegetable juice.  Boxed mixes or ready-to-eat boxed meals and side dishes.  Bottled dressings, sauces, and marinades. Talk with your dietitian about how much potassium, phosphorus, protein, and sodium you may have each day. Summary  Chronic kidney disease (CKD) can lead to a buildup of waste and extra substances in the body. Certain foods lead to a buildup of these  substances. By changing your diet as told, you can help prevent more kidney damage and delay or prevent the need for dialysis.  Food intake changes are different for each person with CKD. Work with a dietitian to set up nutrient goals and a meal plan that is right for you.  If you have diabetes and CKD, it is important to keep your blood sugar in the target range recommended by your health care provider. This information is not intended to replace advice given to you by your health care provider. Make sure you discuss any questions you have with your health care provider. Document Revised: 10/30/2019 Document Reviewed: 10/30/2019 Elsevier Patient Education  2021 [employer].

## 2020-08-08 NOTE — Progress Notes (Addendum)
Chronic Care Management Pharmacy Assistant   Name: CHRISTIAAN STREBECK  MRN: [id number] DOB: Jan 17, 1956  Reason for Encounter: Patient Assistance Application/ Victoza    PCP : Juline Patch, MD  Allergies:   Allergies  Allergen Reactions   Ciprofloxacin Other (See Comments)    hallucination   Pollen Extract Other (See Comments)    Sneezing, watery eyes, etc.   Caffeine Palpitations    palpatations    Medications: Outpatient Encounter Medications as of 08/08/2020  Medication Sig   allopurinol (ZYLOPRIM) 100 MG tablet Take 1 tablet (100 mg total) by mouth daily.   amLODipine (NORVASC) 10 MG tablet Take 1 tablet (10 mg total) by mouth daily.   azithromycin (ZITHROMAX) 250 MG tablet 2 today then 1 a day for 4 days   b complex vitamins tablet Take 1 tablet by mouth daily.   Cholecalciferol 50 MCG (2000 UT) CAPS Take 2,000 Units by mouth daily.    DHA-EPA-VIT B6-B12-FOLIC ACID PO Take 0,347 Units by mouth daily.   liraglutide (VICTOZA) 18 MG/3ML SOPN INJECT SUBCUTANEOUSLY 1.8MG  ONCE A DAY   loratadine (CLARITIN) 10 MG tablet Take 10 mg by mouth at bedtime. otc   lovastatin (MEVACOR) 20 MG tablet Take 1 tablet (20 mg total) by mouth daily.   metoprolol succinate (TOPROL-XL) 100 MG 24 hr tablet Take 1 tablet (100 mg total) by mouth daily. Take with or immediately following a meal.   NOVOFINE PEN NEEDLE 32G X 6 MM MISC USE AND DISCARD 1 PEN      NEEDLE SUBCUTANEOUSLY DAILYAS DIRECTED   pantoprazole (PROTONIX) 40 MG tablet Take 1 tablet (40 mg total) by mouth daily.   spironolactone (ALDACTONE) 25 MG tablet Take 25 mg by mouth daily.   torsemide (DEMADEX) 10 MG tablet Take 10 mg by mouth daily as needed (ankle swelling/fluid retention.). Takes  For swelling has not needed   warfarin (COUMADIN) 2 MG tablet Take 2 tablets (4 mg total) by mouth daily.   warfarin (JANTOVEN) 5 MG tablet Take 1 tablet (5 mg total) by mouth daily. (Patient not taking: Reported on 07/30/2020)   No  facility-administered encounter medications on file as of 08/08/2020.    Current Diagnosis: Patient Active Problem List   Diagnosis Date Noted   Complication of vascular access for dialysis 06/06/2020   Fistula 02/23/2020   Polycystic liver disease 01/10/2020   Polycystic kidney disease 42/59/5638   Metabolic acidosis 75/64/3329   CKD stage 4 due to type 2 diabetes mellitus ([place]) 01/25/2018   Type 2 diabetes mellitus with diabetic nephropathy, without long-term current use of insulin ([place]) 01/25/2018   Secondary hyperparathyroidism of renal origin ([place]) 08/19/2017   Chronic atrial fibrillation ([place]) 01/06/2017   Type 2 diabetes mellitus without complication, without long-term current use of insulin ([place]) 01/06/2017   Essential hypertension 01/06/2017   Hyperuricemia 01/06/2017   Mixed hyperlipidemia 01/06/2017   Gastroesophageal reflux disease 01/06/2017   Obesity 12/26/2012   08-08-20: The patient expressed the need for financial assistance related to the medication cost of Victoza. Patient assistance application prefilled. Needing the patient to sign the application and provide proof of income. Once the patient has completed this, the application then will be signed by the provider and faxed to [employer].  08-13-20: Prefilled patient assistance application was printed. Highlighted all information that the patient is needing to complete. Also flagged the same information. Provided written instructions on how to complete the patient assistance application, requested copy of proof of income and included a  self addressed envelope if the patient wanted to return via mail.   Cloretta Ned, LPN Clinical Pharmacist Assistant  [phone]    Follow-Up:  Patient Assistance Coordination  I have reviewed the care management and care coordination activities outlined in this encounter and I am certifying that I agree with the content of this note.  Junita Push. Kenton Kingfisher PharmD, [facility] [phone]

## 2020-08-12 ENCOUNTER — Ambulatory Visit: Payer: Medicare Other

## 2020-08-12 DIAGNOSIS — I482 Chronic atrial fibrillation, unspecified: Secondary | ICD-10-CM

## 2020-08-13 LAB — PROTIME-INR
INR: 2 — ABNORMAL HIGH (ref 0.9–1.2)
Prothrombin Time: 20.9 s — ABNORMAL HIGH (ref 9.1–12.0)

## 2020-08-22 ENCOUNTER — Telehealth: Payer: Self-pay

## 2020-08-22 NOTE — Telephone Encounter (Signed)
Called to inform patient of message.

## 2020-08-22 NOTE — Telephone Encounter (Signed)
Copied from [place] [id number]. Topic: General - Inquiry >> Aug 22, 2020  8:12 AM Gillis Ends D wrote: Reason for CRM: Patient would like to know if Dr. Ronnald Ramp would like him to get labs prior to his appointment, if so he wants to come by and get the paperwork and take it to [facility]. He can be reached at [phone]. Please advise

## 2020-08-22 NOTE — Telephone Encounter (Signed)
She will order labs after his appt- if he has an afternoon appt, eat a light breakfast like cereal and skip lunch. Please call

## 2020-08-26 DIAGNOSIS — E872 Acidosis: Secondary | ICD-10-CM | POA: Diagnosis not present

## 2020-08-26 DIAGNOSIS — I10 Essential (primary) hypertension: Secondary | ICD-10-CM | POA: Diagnosis not present

## 2020-08-26 DIAGNOSIS — N184 Chronic kidney disease, stage 4 (severe): Secondary | ICD-10-CM | POA: Diagnosis not present

## 2020-08-26 DIAGNOSIS — Q613 Polycystic kidney, unspecified: Secondary | ICD-10-CM | POA: Diagnosis not present

## 2020-09-04 ENCOUNTER — Ambulatory Visit (INDEPENDENT_AMBULATORY_CARE_PROVIDER_SITE_OTHER): Payer: Medicare Other | Admitting: Family Medicine

## 2020-09-04 ENCOUNTER — Other Ambulatory Visit: Payer: Self-pay

## 2020-09-04 ENCOUNTER — Encounter: Payer: Self-pay | Admitting: Family Medicine

## 2020-09-04 VITALS — BP 120/80 | HR 64 | Ht 72.0 in | Wt 258.0 lb

## 2020-09-04 DIAGNOSIS — E1121 Type 2 diabetes mellitus with diabetic nephropathy: Secondary | ICD-10-CM

## 2020-09-04 DIAGNOSIS — Z7901 Long term (current) use of anticoagulants: Secondary | ICD-10-CM

## 2020-09-04 DIAGNOSIS — I482 Chronic atrial fibrillation, unspecified: Secondary | ICD-10-CM | POA: Diagnosis not present

## 2020-09-04 DIAGNOSIS — R059 Cough, unspecified: Secondary | ICD-10-CM

## 2020-09-04 MED ORDER — VICTOZA 18 MG/3ML ~~LOC~~ SOPN: PEN_INJECTOR | SUBCUTANEOUS | 1 refills | Status: DC

## 2020-09-04 MED ORDER — BENZONATATE 100 MG PO CAPS: 100.0000 mg | ORAL_CAPSULE | Freq: Two times a day (BID) | ORAL | 0 refills | Status: DC | PRN

## 2020-09-04 NOTE — Progress Notes (Signed)
Date:  09/04/2020   Name:  Mark Bauer   DOB:  [birth year]   MRN:  [id number]   Chief Complaint: Diabetes, Atrial Fibrillation, and Cough (Finished [employer]- still has cough with production that makes him "gag")  Diabetes He presents for his follow-up diabetic visit. He has type 2 diabetes mellitus. His disease course has been stable. There are no hypoglycemic associated symptoms. Pertinent negatives for hypoglycemia include no dizziness, headaches or nervousness/anxiousness. There are no diabetic associated symptoms. Pertinent negatives for diabetes include no blurred vision, no chest pain, no fatigue, no foot paresthesias, no foot ulcerations, no polydipsia, no polyphagia, no polyuria, no visual change, no weakness and no weight loss. There are no hypoglycemic complications. Symptoms are stable. There are no diabetic complications. Current diabetic treatments: victoza 1.8 mg. His weight is stable. He is following a generally healthy diet. Meal planning includes avoidance of concentrated sweets and carbohydrate counting. He participates in exercise daily (walks daily). His breakfast blood glucose is taken between 8-9 am. His breakfast blood glucose range is generally 90-110 mg/dl. An ACE inhibitor/angiotensin II receptor blocker is not being taken. Eye exam is current.  Atrial Fibrillation Presents for follow-up visit. Symptoms are negative for chest pain, dizziness, palpitations, shortness of breath, tachycardia and weakness. The symptoms have been stable. Past medical history includes atrial fibrillation. There are no medication compliance problems.  Cough This is a new problem. The current episode started more than 1 month ago. The problem has been waxing and waning. The cough is productive of sputum. Pertinent negatives include no chest pain, chills, ear congestion, ear pain, fever, headaches, myalgias, nasal congestion, postnasal drip, rash, rhinorrhea, sore throat, shortness of breath, weight  loss or wheezing. There is no history of environmental allergies.    Lab Results  Component Value Date   CREATININE 6.46 (H) 02/16/2020   BUN 78 (H) 02/16/2020   NA 138 02/16/2020   K 4.7 02/23/2020   CL 106 02/16/2020   CO2 21 (L) 02/16/2020   Lab Results  Component Value Date   CHOL 100 05/02/2020   HDL 34 (L) 05/02/2020   LDLCALC 55 05/02/2020   TRIG 42 05/02/2020   CHOLHDL 3.7 12/16/2016   Lab Results  Component Value Date   TSH 3.040 01/09/2019   Lab Results  Component Value Date   HGBA1C 6.1 (H) 05/02/2020   Lab Results  Component Value Date   WBC 6.2 02/16/2020   HGB 12.6 (L) 02/16/2020   HCT 37.1 (L) 02/16/2020   MCV 94.6 02/16/2020   PLT 157 02/16/2020   Lab Results  Component Value Date   ALT 34 06/09/2019   AST 26 06/09/2019   ALKPHOS 75 06/09/2019   BILITOT 0.5 06/09/2019     Review of Systems  Constitutional: Negative for chills, fatigue, fever and weight loss.  HENT: Negative for drooling, ear discharge, ear pain, postnasal drip, rhinorrhea and sore throat.   Eyes: Negative for blurred vision.  Respiratory: Positive for cough. Negative for shortness of breath and wheezing.   Cardiovascular: Negative for chest pain, palpitations and leg swelling.  Gastrointestinal: Negative for abdominal pain, blood in stool, constipation, diarrhea and nausea.  Endocrine: Negative for polydipsia, polyphagia and polyuria.  Genitourinary: Negative for dysuria, frequency, hematuria and urgency.  Musculoskeletal: Negative for back pain, myalgias and neck pain.  Skin: Negative for rash.  Allergic/Immunologic: Negative for environmental allergies.  Neurological: Negative for dizziness, weakness and headaches.  Hematological: Does not bruise/bleed easily.  Psychiatric/Behavioral: Negative for suicidal  ideas. The patient is not nervous/anxious.     Patient Active Problem List   Diagnosis Date Noted  . Complication of vascular access for dialysis 06/06/2020  .  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 Polycystic liver disease 01/10/2020  . Polycystic kidney disease 03/01/2019  . Metabolic acidosis 25/11/3974  . CKD stage 4 due to type 2 diabetes mellitus ([place]) 01/25/2018  . Type 2 diabetes mellitus with diabetic nephropathy, without long-term current use of insulin ([place]) 01/25/2018  . Secondary hyperparathyroidism of renal origin ([place]) 08/19/2017  . Chronic atrial fibrillation ([place]) 01/06/2017  . Type 2 diabetes mellitus without complication, without long-term current use of insulin ([place]) 01/06/2017  . Essential hypertension 01/06/2017  . Hyperuricemia 01/06/2017  . Mixed hyperlipidemia 01/06/2017  . Gastroesophageal reflux disease 01/06/2017  . Obesity 12/26/2012    Allergies  Allergen Reactions  . Ciprofloxacin Other (See Comments)    hallucination  . Pollen Extract Other (See Comments)    Sneezing, watery eyes, etc.  . Caffeine Palpitations    palpatations    Past Surgical History:  Procedure Laterality Date  . BASCILIC VEIN TRANSPOSITION Left 02/23/2020   Procedure: BASCILIC VEIN TRANSPOSITION (SINGLE STAGE);  Surgeon: Katha Cabal, MD;  Location: [facility] ORS;  Service: Vascular;  Laterality: Left;  . COLONOSCOPY  2012   hemorrhoids  . melanoma removal     forehead    Social History   Tobacco Use  . Smoking status: Never Smoker  . Smokeless tobacco: Never Used  Vaping Use  . Vaping Use: Never used  Substance Use Topics  . Alcohol use: No    Alcohol/week: 0.0 standard drinks  . Drug use: No     Medication list has been reviewed and updated.  Current Meds  Medication Sig  . allopurinol (ZYLOPRIM) 100 MG tablet Take 1 tablet (100 mg total) by mouth daily.  Marland Kitchen amLODipine (NORVASC) 10 MG tablet Take 1 tablet (10 mg total) by mouth daily.  Marland Kitchen b complex vitamins tablet Take 1 tablet by mouth daily.  . Cholecalciferol 50 MCG (2000 UT) CAPS Take 2,000 Units by mouth daily.   . DHA-EPA-VIT B6-B12-FOLIC ACID PO Take 7,341 Units by mouth  daily.  Marland Kitchen liraglutide (VICTOZA) 18 MG/3ML SOPN INJECT SUBCUTANEOUSLY 1.8MG  ONCE A DAY  . loratadine (CLARITIN) 10 MG tablet Take 10 mg by mouth at bedtime. otc  . lovastatin (MEVACOR) 20 MG tablet Take 1 tablet (20 mg total) by mouth daily.  . metoprolol succinate (TOPROL-XL) 100 MG 24 hr tablet Take 1 tablet (100 mg total) by mouth daily. Take with or immediately following a meal.  . NOVOFINE PEN NEEDLE 32G X 6 MM MISC USE AND DISCARD 1 PEN      NEEDLE SUBCUTANEOUSLY DAILYAS DIRECTED  . pantoprazole (PROTONIX) 40 MG tablet Take 1 tablet (40 mg total) by mouth daily.  Marland Kitchen spironolactone (ALDACTONE) 25 MG tablet Take 25 mg by mouth daily.  Marland Kitchen torsemide (DEMADEX) 10 MG tablet Take 10 mg by mouth daily as needed (ankle swelling/fluid retention.). Takes  For swelling has not needed  . VALERIAN ROOT PO Take 1 each by mouth daily.  Marland Kitchen warfarin (COUMADIN) 2 MG tablet Take 2 tablets (4 mg total) by mouth daily.  Marland Kitchen warfarin (JANTOVEN) 5 MG tablet Take 1 tablet (5 mg total) by mouth daily.  . [DISCONTINUED] azithromycin (ZITHROMAX) 250 MG tablet 2 today then 1 a day for 4 days    PHQ 2/9 Scores 09/04/2020 06/24/2020 05/02/2020 01/10/2020  PHQ - 2 Score 0 0 0 0  PHQ- 9 Score 0 0 0 -    GAD 7 : Generalized Anxiety Score 09/04/2020 06/24/2020 05/02/2020  Nervous, Anxious, on Edge 0 0 0  Control/stop worrying 0 0 0  Worry too much - different things 0 0 0  Trouble relaxing 0 0 0  Restless 0 0 0  Easily annoyed or irritable 0 0 0  Afraid - awful might happen 0 0 0  Total GAD 7 Score 0 0 0    BP Readings from Last 3 Encounters:  09/04/20 120/80  06/24/20 130/62  06/06/20 103/66    Physical Exam Vitals reviewed.  HENT:     Head: Normocephalic.     Right Ear: Tympanic membrane, ear canal and external ear normal.     Left Ear: Tympanic membrane, ear canal and external ear normal.     Nose: Nose normal. No congestion or rhinorrhea.     Mouth/Throat:     Mouth: Oropharynx is clear and moist.  Eyes:      General: No scleral icterus.       Right eye: No discharge.        Left eye: No discharge.     Extraocular Movements: EOM normal.     Conjunctiva/sclera: Conjunctivae normal.     Pupils: Pupils are equal, round, and reactive to light.  Neck:     Thyroid: No thyromegaly.     Vascular: No JVD.     Trachea: No tracheal deviation.  Cardiovascular:     Rate and Rhythm: Normal rate and regular rhythm.     Pulses: Normal pulses and intact distal pulses.     Heart sounds: Normal heart sounds, S1 normal and S2 normal. No murmur heard.  No systolic murmur is present.  No diastolic murmur is present. No friction rub. No gallop. No S3 or S4 sounds.   Pulmonary:     Effort: No respiratory distress.     Breath sounds: Normal breath sounds. No wheezing, rhonchi or rales.  Abdominal:     General: Bowel sounds are normal.     Palpations: Abdomen is soft. There is no hepatosplenomegaly or mass.     Tenderness: There is no abdominal tenderness. There is no CVA tenderness, guarding or rebound.  Musculoskeletal:        General: No tenderness or edema. Normal range of motion.     Cervical back: Normal range of motion and neck supple.     Right lower leg: No edema.     Left lower leg: No edema.  Lymphadenopathy:     Cervical: No cervical adenopathy.  Skin:    General: Skin is warm.     Capillary Refill: Capillary refill takes less than 2 seconds.     Findings: No rash.  Neurological:     Mental Status: He is alert and oriented to person, place, and time.     Cranial Nerves: No cranial nerve deficit.     Deep Tendon Reflexes: Strength normal and reflexes are normal and symmetric.     Wt Readings from Last 3 Encounters:  09/04/20 258 lb (117 kg)  06/24/20 273 lb (123.8 kg)  06/06/20 262 lb 9.6 oz (119.1 kg)    BP 120/80   Pulse 64   Ht 6' (1.829 m)   Wt 258 lb (117 kg)   BMI 34.99 kg/m   Assessment and Plan: 1. Type 2 diabetes mellitus with diabetic nephropathy, without long-term  current use of insulin (HCC) Chronic.  Controlled.  Stable.  Patient is currently  doing well on Victoza 1.8 mg daily.  We discussed Rybelsus and at this point time he would like just to continue on current Victoza therapy.  Today we will obtain a microalbuminuria and an A1c for.  Of nephrology and endocrine control of his diabetes. - Microalbumin, urine - HgB A1c - liraglutide (VICTOZA) 18 MG/3ML SOPN; INJECT SUBCUTANEOUSLY 1.8MG  ONCE A DAY  Dispense: 27 mL; Refill: 1  2. Cough Patient has a occasional cough that is productive of clear sputum I suspect this is likely postnasal in relation.  There is some allergy component similar starting to get into this weather pattern I would prescribe Tessalon Perles to be used on a as needed basis for the cough - benzonatate (TESSALON) 100 MG capsule; Take 1 capsule (100 mg total) by mouth 2 (two) times daily as needed for cough.  Dispense: 20 capsule; Refill: 0  3. Anticoagulated And is anticoagulated and we will check a PT/INR - INR/PT  4. Chronic atrial fibrillation (HCC) Chronic.  Controlled.  Stable.  Patient is in regular rhythm today.  We will continue to monitor and anticoagulate as indicated. - INR/PT

## 2020-09-05 LAB — PROTIME-INR
INR: 2.7 — ABNORMAL HIGH (ref 0.9–1.2)
Prothrombin Time: 27.1 s — ABNORMAL HIGH (ref 9.1–12.0)

## 2020-09-05 LAB — HEMOGLOBIN A1C
Est. average glucose Bld gHb Est-mCnc: 117 mg/dL
Hgb A1c MFr Bld: 5.7 % — ABNORMAL HIGH (ref 4.8–5.6)

## 2020-09-05 LAB — MICROALBUMIN, URINE: Microalbumin, Urine: 253.3 ug/mL

## 2020-09-11 DIAGNOSIS — M1A9XX0 Chronic gout, unspecified, without tophus (tophi): Secondary | ICD-10-CM | POA: Diagnosis not present

## 2020-09-11 DIAGNOSIS — Q613 Polycystic kidney, unspecified: Secondary | ICD-10-CM | POA: Diagnosis not present

## 2020-09-11 DIAGNOSIS — N2581 Secondary hyperparathyroidism of renal origin: Secondary | ICD-10-CM | POA: Diagnosis not present

## 2020-09-11 DIAGNOSIS — E876 Hypokalemia: Secondary | ICD-10-CM | POA: Diagnosis not present

## 2020-09-11 DIAGNOSIS — D631 Anemia in chronic kidney disease: Secondary | ICD-10-CM | POA: Diagnosis not present

## 2020-09-11 DIAGNOSIS — I10 Essential (primary) hypertension: Secondary | ICD-10-CM | POA: Diagnosis not present

## 2020-09-11 DIAGNOSIS — Z6833 Body mass index (BMI) 33.0-33.9, adult: Secondary | ICD-10-CM | POA: Diagnosis not present

## 2020-09-11 DIAGNOSIS — N184 Chronic kidney disease, stage 4 (severe): Secondary | ICD-10-CM | POA: Diagnosis not present

## 2020-09-11 DIAGNOSIS — R252 Cramp and spasm: Secondary | ICD-10-CM | POA: Diagnosis not present

## 2020-09-11 DIAGNOSIS — N185 Chronic kidney disease, stage 5: Secondary | ICD-10-CM | POA: Diagnosis not present

## 2020-09-30 ENCOUNTER — Other Ambulatory Visit: Payer: Self-pay

## 2020-09-30 MED ORDER — BLOOD GLUCOSE METER KIT: PACK | 0 refills | Status: AC

## 2020-09-30 MED ORDER — BLOOD GLUCOSE MONITOR KIT: PACK | 0 refills | Status: DC

## 2020-10-02 ENCOUNTER — Telehealth: Payer: Self-pay | Admitting: Pharmacist

## 2020-10-02 NOTE — Chronic Care Management (AMB) (Signed)
Chronic Care Management Pharmacy Assistant   Name: Mark Bauer  MRN: [id number] DOB: Jul 12, 1956  Reason for Encounter: Disease State/Diabetes Adherence Call  Recent office visits:  09/04/2020 [facility] PCP Dr. Ronnald Ramp; given Rx for Tessalon Perles 100 mg twice daily as needed for cough  Recent consult visits:  09/11/2020 [facility] (nephrology) Lucy Meneff, ANP; no medication changes indicated.  Hospital visits:  None in previous 6 months  Medications: Outpatient Encounter Medications as of 10/02/2020  Medication Sig  . allopurinol (ZYLOPRIM) 100 MG tablet Take 1 tablet (100 mg total) by mouth daily.  Marland Kitchen amLODipine (NORVASC) 10 MG tablet Take 1 tablet (10 mg total) by mouth daily.  Marland Kitchen b complex vitamins tablet Take 1 tablet by mouth daily.  . benzonatate (TESSALON) 100 MG capsule Take 1 capsule (100 mg total) by mouth 2 (two) times daily as needed for cough.  . blood glucose meter kit and supplies Dispense based on patient and insurance preference. Use up to four times daily as directed. (FOR ICD-10 E11.9).  Marland Kitchen Cholecalciferol 50 MCG (2000 UT) CAPS Take 2,000 Units by mouth daily.   . DHA-EPA-VIT B6-B12-FOLIC ACID PO Take 4,098 Units by mouth daily.  Marland Kitchen liraglutide (VICTOZA) 18 MG/3ML SOPN INJECT SUBCUTANEOUSLY 1.8MG ONCE A DAY  . loratadine (CLARITIN) 10 MG tablet Take 10 mg by mouth at bedtime. otc  . lovastatin (MEVACOR) 20 MG tablet Take 1 tablet (20 mg total) by mouth daily.  . metoprolol succinate (TOPROL-XL) 100 MG 24 hr tablet Take 1 tablet (100 mg total) by mouth daily. Take with or immediately following a meal.  . NOVOFINE PEN NEEDLE 32G X 6 MM MISC USE AND DISCARD 1 PEN      NEEDLE SUBCUTANEOUSLY DAILYAS DIRECTED  . pantoprazole (PROTONIX) 40 MG tablet Take 1 tablet (40 mg total) by mouth daily.  Marland Kitchen spironolactone (ALDACTONE) 25 MG tablet Take 25 mg by mouth daily.  Marland Kitchen torsemide (DEMADEX) 10 MG tablet Take 10 mg by mouth daily as needed (ankle swelling/fluid retention.). Takes  For  swelling has not needed  . VALERIAN ROOT PO Take 1 each by mouth daily.  Marland Kitchen warfarin (COUMADIN) 2 MG tablet Take 2 tablets (4 mg total) by mouth daily.  Marland Kitchen warfarin (JANTOVEN) 5 MG tablet Take 1 tablet (5 mg total) by mouth daily.   No facility-administered encounter medications on file as of 10/02/2020.   Recent Relevant Labs: Lab Results  Component Value Date/Time   HGBA1C 5.7 (H) 09/04/2020 09:28 AM   HGBA1C 6.1 (H) 05/02/2020 08:49 AM    Kidney Function Lab Results  Component Value Date/Time   CREATININE 6.46 (H) 02/16/2020 02:34 PM   CREATININE 4.2 (A) 09/27/2019 12:00 AM   CREATININE 3.55 (H) 01/02/2019 10:42 AM   GFRNONAA 8 (L) 02/16/2020 02:34 PM   GFRAA 9 (L) 02/16/2020 02:34 PM    . Current antihyperglycemic regimen:  o Victoza 17m/3mL , 1.8 mg once a day  . What recent interventions/DTPs have been made to improve glycemic control:  o None noted, patient states he is currently taking his Victoza as instructed.  . Have there been any recent hospitalizations or ED visits since last visit with CPP? No   . Patient denies hypoglycemic symptoms, including Pale, Sweaty, Shaky, Hungry, Nervous/irritable and Vision changes   . Patient denies hyperglycemic symptoms, including blurry vision, excessive thirst, fatigue, polyuria and weakness   . How often are you checking your blood sugar? 3 times a week  . What are your blood sugars ranging?  o Fasting: 101 o Before meals: n/a o After meals: n/a o Bedtime: n/a  . During the week, how often does your blood glucose drop below 70? Never   . Are you checking your feet daily/regularly? Yes, patient states he checks his feet regularly.  Adherence Review: Is the patient currently on a STATIN medication? Yes Is the patient currently on ACE/ARB medication? No Does the patient have >5 day gap between last estimated fill dates? No  Patient states he did not complete his PAP for Victoza. Patient states he makes too much money to  qualify for assistance.  Star Rating Drugs: Victoza 64m/3mL pen last filled 09/04/2020 90 DS Losartan 100 mg last filled 04/192021 90 DS Lovastatin 20 mg last filled 05/02/2020 90 DS  Future Appointments  Date Time Provider [place] 01/08/2021  9:20 AM JJuline Patch MD [facility]  01/13/2021  2:00 PM [facility] 06/05/2021 10:30 AM [facility] 3 [facility] None  06/05/2021 11:30 AM Schnier, GDolores Lory MD [facility] None    Patient scheduled a follow up telephone appointment with JBirdena Crandall CPP on 11/05/2020 at 2:00 pm.  April D Calhoun, CBradley JunctionPharmacist Assistant [phone]

## 2020-10-08 ENCOUNTER — Telehealth: Payer: Self-pay | Admitting: Family Medicine

## 2020-10-08 ENCOUNTER — Other Ambulatory Visit: Payer: Self-pay | Admitting: Family Medicine

## 2020-10-08 ENCOUNTER — Other Ambulatory Visit: Payer: Medicare Other

## 2020-10-08 ENCOUNTER — Other Ambulatory Visit: Payer: Self-pay

## 2020-10-08 DIAGNOSIS — I10 Essential (primary) hypertension: Secondary | ICD-10-CM

## 2020-10-08 DIAGNOSIS — Z7901 Long term (current) use of anticoagulants: Secondary | ICD-10-CM

## 2020-10-08 DIAGNOSIS — E782 Mixed hyperlipidemia: Secondary | ICD-10-CM

## 2020-10-08 MED ORDER — METOPROLOL SUCCINATE ER 100 MG PO TB24: 100.0000 mg | ORAL_TABLET | Freq: Every day | ORAL | 1 refills | Status: DC

## 2020-10-08 MED ORDER — LOVASTATIN 20 MG PO TABS: 20.0000 mg | ORAL_TABLET | Freq: Every day | ORAL | 1 refills | Status: DC

## 2020-10-08 NOTE — Progress Notes (Signed)
Placed order for INR

## 2020-10-08 NOTE — Telephone Encounter (Signed)
Copied from [place] [id number]. Topic: Quick Communication - Rx Refill/Question >> Oct 08, 2020  4:27 PM Mcneil, Ja-Kwan wrote: Medication: lovastatin (MEVACOR) 20 MG tablet and metoprolol succinate (TOPROL-XL) 100 MG 24 hr tablet  Has the patient contacted their pharmacy? yes   Preferred Pharmacy (with phone number or street name): [facility] [place], [place] to [address] [place] [address] Phone: [phone]   Fax: [phone]  Agent: Please be advised that RX refills may take up to 3 business days. We ask that you follow-up with your pharmacy.

## 2020-10-08 NOTE — Telephone Encounter (Signed)
Patient called to request orders for  Pt INR test.  He stated that the doctor usually sends in the paperwork for the orders when needed.  Please advise and call when this is completed so patient can get the lab work.  CB# [phone]

## 2020-10-08 NOTE — Telephone Encounter (Signed)
Put lab in

## 2020-10-09 LAB — PROTIME-INR
INR: 1.9 — ABNORMAL HIGH (ref 0.9–1.2)
Prothrombin Time: 19.8 s — ABNORMAL HIGH (ref 9.1–12.0)

## 2020-10-18 ENCOUNTER — Encounter: Payer: Self-pay | Admitting: Urology

## 2020-10-21 DIAGNOSIS — E11319 Type 2 diabetes mellitus with unspecified diabetic retinopathy without macular edema: Secondary | ICD-10-CM | POA: Diagnosis not present

## 2020-10-21 LAB — HM DIABETES EYE EXAM

## 2020-10-24 ENCOUNTER — Telehealth: Payer: Self-pay | Admitting: Family Medicine

## 2020-10-24 ENCOUNTER — Ambulatory Visit: Payer: Medicare Other

## 2020-10-24 DIAGNOSIS — Z7901 Long term (current) use of anticoagulants: Secondary | ICD-10-CM

## 2020-10-24 NOTE — Telephone Encounter (Signed)
Patient called to request orders for his blood test so that he can take it to [facility].  Please advise and call to discuss at [phone]

## 2020-10-24 NOTE — Telephone Encounter (Signed)
Put on schedule and hit arrived please

## 2020-10-25 LAB — PROTIME-INR
INR: 2.1 — ABNORMAL HIGH (ref 0.9–1.2)
Prothrombin Time: 21.2 s — ABNORMAL HIGH (ref 9.1–12.0)

## 2020-10-28 NOTE — Progress Notes (Signed)
[facility] Note  10/30/2020     CHIEF COMPLAINT Patient presents for Retina Evaluation   HISTORY OF PRESENT ILLNESS: Mark Bauer is a 65 y.o. male who presents to the clinic today for:   HPI    Retina Evaluation    In right eye.  Onset: Unknown.  Duration: Unkknown.  Context:  distance vision.  Treatments tried include no treatments.  I, the attending physician,  performed the HPI with the patient and updated documentation appropriately.          Comments    65 y/o male pt referred by Dr. Agapito Games for eval of possible CNV OD.  Pt was seen by Dr. Marvel Plan 9 days ago for CEE, and had no idea anything was wrong.  VA good cc.  Denies pain, FOL, floaters.  No gtts.  BS unknown.  A1C 5.7.       Last edited by Bernarda Caffey, MD on 10/30/2020  8:57 AM. (History)    pt is here on the referral of Dr. Marvel Plan for possible CNV OD, pt states he saw Dr. Marvel Plan for a routine eye exam, pt has a fib and is on blood thinners, he states he bruises easily, but no other complications for the blood thinners, pt has no complaints about his vision  Referring physician: Agapito Games OD [place], [place] [id number]  HISTORICAL INFORMATION:   Selected notes from the [facility] Referred by Dr. Agapito Games for diabetic retinal eval LEE: 10/21/2020 BCVA OD: 20/25-2 OS: 20/25-2 Ocular Hx- ?DR PMH- DM, HTN, gout, last a1c was 5.7 in February 2022    CURRENT MEDICATIONS: No current outpatient medications on file. (Ophthalmic Drugs)   No current facility-administered medications for this visit. (Ophthalmic Drugs)   Current Outpatient Medications (Other)  Medication Sig  . allopurinol (ZYLOPRIM) 100 MG tablet Take 1 tablet (100 mg total) by mouth daily.  Marland Kitchen amLODipine (NORVASC) 10 MG tablet Take 1 tablet (10 mg total) by mouth daily.  Marland Kitchen b complex vitamins tablet Take 1 tablet by mouth daily.  . benzonatate  (TESSALON) 100 MG capsule Take 1 capsule (100 mg total) by mouth 2 (two) times daily as needed for cough.  . blood glucose meter kit and supplies Dispense based on patient and insurance preference. Use up to four times daily as directed. (FOR ICD-10 E11.9).  Marland Kitchen Cholecalciferol 50 MCG (2000 UT) CAPS Take 2,000 Units by mouth daily.   . DHA-EPA-VIT B6-B12-FOLIC ACID PO Take 4,818 Units by mouth daily.  Marland Kitchen glucose blood (ONETOUCH ULTRA) test strip USE AS DIRECTED EVERY DAY  . Lancets (ONETOUCH DELICA PLUS [id number]) MISC USE AS DIRECTED EVERY DAY  . liraglutide (VICTOZA) 18 MG/3ML SOPN INJECT SUBCUTANEOUSLY 1.8MG ONCE A DAY  . loratadine (CLARITIN) 10 MG tablet Take 10 mg by mouth at bedtime. otc  . losartan (COZAAR) 100 MG tablet   . lovastatin (MEVACOR) 20 MG tablet Take 1 tablet (20 mg total) by mouth daily.  . metoprolol succinate (TOPROL-XL) 100 MG 24 hr tablet Take 1 tablet (100 mg total) by mouth daily. Take with or immediately following a meal.  . NOVOFINE PEN NEEDLE 32G X 6 MM MISC USE AND DISCARD 1 PEN      NEEDLE SUBCUTANEOUSLY DAILYAS DIRECTED  . pantoprazole (PROTONIX) 40 MG tablet Take 1 tablet (40 mg total) by mouth daily.  Marland Kitchen spironolactone (ALDACTONE) 25 MG tablet Take 25 mg by mouth daily.  Marland Kitchen torsemide (DEMADEX) 10 MG  tablet Take 10 mg by mouth daily as needed (ankle swelling/fluid retention.). Takes  For swelling has not needed  . VALERIAN ROOT PO Take 1 each by mouth daily.  Cristino Martes Root POWD Take by mouth.  . warfarin (COUMADIN) 2 MG tablet Take 2 tablets (4 mg total) by mouth daily.  Marland Kitchen warfarin (JANTOVEN) 5 MG tablet Take 1 tablet (5 mg total) by mouth daily.   No current facility-administered medications for this visit. (Other)      REVIEW OF SYSTEMS: ROS    Positive for: Gastrointestinal, Genitourinary, Endocrine, Cardiovascular, Eyes   Negative for: Constitutional, Neurological, Skin, Musculoskeletal, HENT, Respiratory, Psychiatric, Allergic/Imm, Heme/Lymph   Last  edited by Matthew Folks, COA on 10/30/2020  8:29 AM. (History)       ALLERGIES Allergies  Allergen Reactions  . Ciprofloxacin Other (See Comments)    hallucination  . Pollen Extract Other (See Comments)    Sneezing, watery eyes, etc.  . Caffeine Palpitations    palpatations    PAST MEDICAL HISTORY Past Medical History:  Diagnosis Date  . Allergy   . Bilateral undescended testicles   . Cancer ([place])    melanoma on forehead  . Cataract    OU  . Diabetes mellitus without complication ([place])   . Dysrhythmia    a fib  . GERD (gastroesophageal reflux disease)   . Gout   . Hepatitis    polycystic   . Hypertension   . Hypokalemia   . Mixed hyperlipidemia   . Polycystic kidney   . Vitamin D deficiency    Past Surgical History:  Procedure Laterality Date  . BASCILIC VEIN TRANSPOSITION Left 02/23/2020   Procedure: BASCILIC VEIN TRANSPOSITION (SINGLE STAGE);  Surgeon: Katha Cabal, MD;  Location: [facility] ORS;  Service: Vascular;  Laterality: Left;  . COLONOSCOPY  2012   hemorrhoids  . melanoma removal     forehead    FAMILY HISTORY Family History  Problem Relation Age of Onset  . Diabetes Mother   . Heart disease Father     SOCIAL HISTORY Social History   Tobacco Use  . Smoking status: Never Smoker  . Smokeless tobacco: Never Used  Vaping Use  . Vaping Use: Never used  Substance Use Topics  . Alcohol use: No    Alcohol/week: 0.0 standard drinks  . Drug use: No         OPHTHALMIC EXAM:  Base Eye Exam    Visual Acuity (Snellen - Linear)      Right Left   Dist cc 20/25 -2 20/20 -2   Dist ph cc NI    Correction: Glasses       Tonometry (Tonopen, 8:33 AM)      Right Left   Pressure 11 10       Pupils      Dark Light Shape React APD   Right 3 2 Round Brisk None   Left 3 2 Round Brisk None       Visual Fields (Counting fingers)      Left Right     Full       Extraocular Movement      Right Left    Full, Ortho Full, Ortho        Neuro/Psych    Oriented x3: Yes   Mood/Affect: Normal       Dilation    Both eyes: 1.0% Mydriacyl, 2.5% Phenylephrine @ 8:33 AM        Slit Lamp and Fundus Exam  Slit Lamp Exam      Right Left   Lids/Lashes Dermatochalasis - upper lid, Meibomian gland dysfunction Dermatochalasis - upper lid, Meibomian gland dysfunction   Conjunctiva/Sclera White and quiet White and quiet   Cornea mild tear film debris mild tear film debris   Anterior Chamber Deep and quiet Deep and quiet   Iris Round and dilated Round and dilated   Lens 2+ Nuclear sclerosis, 2+ Cortical cataract 2+ Nuclear sclerosis, 2-3+ Cortical cataract   Vitreous Vitreous syneresis, Posterior vitreous detachment Vitreous syneresis       Fundus Exam      Right Left   Disc mild Pallor, Sharp rim, Compact mild Pallor, Sharp rim   C/D Ratio 0.2 0.4   Macula Flat, Blunted foveal reflex, RPE mottling and clumping, faint subretinal hemorrhage along proximal IT arteriole Flat, Blunted foveal reflex, mild RPE mottling and clumping, No heme or edema   Vessels attenuated, Tortuous, AV crossing changes attenuated, Tortuous, AV crossing changes   Periphery Attached, blonde fundus, prominent vortex veins, pigmented cystoid degeneration inferiorly Attached, shallow peripheral schisis, pigmented cystoid degeneration inferiorly , No RT/RD        Refraction    Wearing Rx      Sphere Cylinder Axis Add   Right -6.50 +1.50 058 +1.75   Left -3.75 +1.50 116 +1.75   Age: 46 yrs   Type: PAL       Manifest Refraction      Sphere Cylinder Axis Dist VA   Right -6.75 +1.75 060 20/25   Left -3.75 +1.75 120 20/20-          IMAGING AND PROCEDURES  Imaging and Procedures for 10/30/2020  OCT, Retina - OU - Both Eyes       Right Eye Quality was good. Central Foveal Thickness: 236. Progression has no prior data. Findings include normal foveal contour, no IRF, no SRF (Foveal notch, focal ellipsoid irregularity, inner retinal hyper  reflectivity along IT arcades).   Left Eye Quality was good. Central Foveal Thickness: 246. Progression has no prior data. Findings include normal foveal contour, no IRF, no SRF, vitreomacular adhesion  (Mild multilaminar schisis 360 periphery, sparing macula).   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 CME - cystoid macular edema. PED - pigment epithelial detachment. IRF - intraretinal fluid. SRF - subretinal fluid. EZ - ellipsoid zone. ERM - epiretinal membrane. ORA - outer retinal atrophy. ORT - outer retinal tubulation. SRHM - subretinal hyper-reflective material. IRHM - intraretinal hyper-reflective material        Fluorescein Angiography Optos (Transit OD)       Right Eye   Early phase findings include normal observations. Mid/Late phase findings include blockage (Focal subretinal hypofluorescent blockage along IT arteriole corresponding to subretinal heme; no leakage).   Left Eye   Early phase findings include normal observations. Mid/Late phase findings include normal observations.   Notes **Images stored on drive**  Impression: OD: Focal subretinal hypofluorescent blockage along IT arteriole corresponding to subretinal heme -- no leakage OS: normal study                  ASSESSMENT/PLAN:    ICD-10-CM   1. Subretinal hemorrhage of right eye  H35.61   2. Retinal edema  H35.81 OCT, Retina - OU - Both Eyes  3. 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 Essential hypertension  I10   5. Hypertensive retinopathy of both eyes  H35.033 Fluorescein Angiography Optos (Transit OD)  6. Combined forms of age-related cataract of both eyes  H25.813    1,2. Subretinal hemorrhage OD  -  incidental finding on routine eye exam by Dr. Marvel Plan  - pt asymptomatic  - focal subretinal heme along proximal IT arteriole OD  - OCT shows mild inner retinal hyperreflectivity and focal ellipsoid irregularity at focal heme site  - FA 4.13.22 without leakage, but +blockage at heme site -- not active  - pt with history of Afib, on coumadin -- history of difficulty controlling INR and easy bruising  - suspect focal subretinal heme is related to history of coumadin use and HTN  - not visually significant and not threatening vision  - discussed findings, prognosis  - no intervention recommended at this time  - monitor  - f/u 3 mos, sooner prn -- DFE/OCT  3. Retinoschisis OS  - peripheral multilaminar schisis, sparing macula -- identified on widefield OCT today 4.13.22  - OCT shows shallow, multilaminar schisis in periphery only  - no RT/RD on exam  - pt asymptomatic  - s/s of RD reviewed  - f/u 3 mos -- DFE/OCT  4,5. Hypertensive retinopathy OU - discussed importance of tight BP control - monitor  6. Mixed Cataract OU - The symptoms of cataract, surgical options, and treatments and risks were discussed with patient. - discussed diagnosis and progression - monitor for now  Ophthalmic Meds Ordered this visit:  No orders of the defined types were placed in this encounter.     Return in about 3 months (around 01/29/2021) for f/u subretinal hemorrhage OD, DFE, OCT.  There are no Patient Instructions on file for this visit.   Explained the diagnoses, plan, and follow up with the patient and they expressed understanding.  Patient expressed understanding of the importance of proper follow up care.   This document serves as a record of services personally performed by Gardiner Sleeper, MD, PhD. It was created on their behalf by Roselee Nova, COMT. The creation of this record is the provider's dictation and/or activities during the visit.  Electronically signed by: Roselee Nova, COMT  10/31/20 12:11 AM  This document serves as a record of services personally performed by Gardiner Sleeper, MD, PhD. It was created on their behalf by San Jetty. Owens Shark, OA an ophthalmic technician. The creation of this record is the provider's dictation and/or activities during the visit.    Electronically signed by: San Jetty. Owens Shark, [place] 04.13.2022 12:11 AM  Gardiner Sleeper, M.D., Ph.D. Diseases & Surgery of the Retina and Vitreous [facility]  I have reviewed the above documentation for accuracy and completeness, and I agree with the above. Gardiner Sleeper, M.D., Ph.D. 10/31/20 12:11 AM  Abbreviations: M myopia (nearsighted); A astigmatism; H hyperopia (farsighted); P presbyopia; Mrx spectacle prescription;  CTL contact lenses; OD right eye; OS left eye; OU both eyes  XT exotropia; ET esotropia; PEK punctate epithelial keratitis; PEE punctate epithelial erosions; DES dry eye syndrome; MGD meibomian gland dysfunction; ATs artificial tears; PFAT's preservative free artificial tears; [place] nuclear sclerotic cataract; PSC posterior subcapsular cataract; ERM epi-retinal membrane; PVD posterior vitreous detachment; RD retinal detachment; DM diabetes mellitus; DR diabetic retinopathy; NPDR non-proliferative diabetic retinopathy; PDR proliferative diabetic retinopathy; CSME clinically significant macular edema; DME diabetic  macular edema; dbh dot blot hemorrhages; CWS cotton wool spot; POAG primary open angle glaucoma; C/D cup-to-disc ratio; HVF humphrey visual field; GVF goldmann visual field; OCT optical coherence tomography; IOP intraocular pressure; BRVO Branch retinal vein occlusion; CRVO central retinal vein occlusion; CRAO central retinal artery occlusion; BRAO branch retinal artery occlusion; RT retinal tear; SB scleral buckle; PPV pars plana vitrectomy; VH Vitreous hemorrhage; PRP panretinal laser photocoagulation; IVK intravitreal kenalog; VMT vitreomacular traction; MH Macular hole;   NVD neovascularization of the disc; NVE neovascularization elsewhere; AREDS age related eye disease study; ARMD age related macular degeneration; POAG primary open angle glaucoma; EBMD epithelial/anterior basement membrane dystrophy; ACIOL anterior chamber intraocular lens; IOL intraocular lens; PCIOL posterior chamber intraocular lens; Phaco/IOL phacoemulsification with intraocular lens placement; [place] photorefractive keratectomy; LASIK laser assisted in situ keratomileusis; HTN hypertension; DM diabetes mellitus; COPD chronic obstructive pulmonary disease

## 2020-10-30 ENCOUNTER — Ambulatory Visit (INDEPENDENT_AMBULATORY_CARE_PROVIDER_SITE_OTHER): Payer: Medicare Other | Admitting: Ophthalmology

## 2020-10-30 ENCOUNTER — Other Ambulatory Visit: Payer: Self-pay

## 2020-10-30 ENCOUNTER — Encounter (INDEPENDENT_AMBULATORY_CARE_PROVIDER_SITE_OTHER): Payer: Self-pay | Admitting: Ophthalmology

## 2020-10-30 DIAGNOSIS — H33102 Unspecified retinoschisis, left eye: Secondary | ICD-10-CM

## 2020-10-30 DIAGNOSIS — I10 Essential (primary) hypertension: Secondary | ICD-10-CM | POA: Diagnosis not present

## 2020-10-30 DIAGNOSIS — H25813 Combined forms of age-related cataract, bilateral: Secondary | ICD-10-CM | POA: Diagnosis not present

## 2020-10-30 DIAGNOSIS — H3581 Retinal edema: Secondary | ICD-10-CM

## 2020-10-30 DIAGNOSIS — H35033 Hypertensive retinopathy, bilateral: Secondary | ICD-10-CM | POA: Diagnosis not present

## 2020-10-30 DIAGNOSIS — H3561 Retinal hemorrhage, right eye: Secondary | ICD-10-CM

## 2020-11-05 ENCOUNTER — Ambulatory Visit (INDEPENDENT_AMBULATORY_CARE_PROVIDER_SITE_OTHER): Payer: Medicare Other | Admitting: Pharmacist

## 2020-11-05 DIAGNOSIS — N184 Chronic kidney disease, stage 4 (severe): Secondary | ICD-10-CM | POA: Diagnosis not present

## 2020-11-05 DIAGNOSIS — E1121 Type 2 diabetes mellitus with diabetic nephropathy: Secondary | ICD-10-CM | POA: Diagnosis not present

## 2020-11-05 DIAGNOSIS — E1122 Type 2 diabetes mellitus with diabetic chronic kidney disease: Secondary | ICD-10-CM | POA: Diagnosis not present

## 2020-11-05 NOTE — Progress Notes (Signed)
Chronic Care Management Pharmacy Note  11/05/2020 Name:  Mark Bauer MRN:  [id number] DOB:  02-27-1956  Subjective: Mark Bauer is an 65 y.o. year old male who is a primary patient of Mark Patch, MD.  The CCM team was consulted for assistance with disease management and care coordination needs.    Engaged with patient by telephone for follow up visit in response to provider referral for pharmacy case management and/or care coordination services.   Consent to Services:  The patient was given information about Chronic Care Management services, agreed to services, and gave verbal consent prior to initiation of services.  Please see initial visit note for detailed documentation.   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, suspect 2/2 warfarin and htn 09/11/20- Mark Bauer, ANP- (nephrology)-CKD IV polycystic kedney disease, planning in center HD bicarb 16 (goal 22-24) baking soda 1/2 tsp daily  Hospital visits: None in previous 6 months  Objective:  Lab Results  Component Value Date   CREATININE 6.46 (H) 02/16/2020   BUN 78 (H) 02/16/2020   GFRNONAA 8 (L) 02/16/2020   GFRAA 9 (L) 02/16/2020   NA 138 02/16/2020   K 4.7 02/23/2020   CALCIUM 9.6 02/16/2020   CO2 21 (L) 02/16/2020   GLUCOSE 109 (H) 02/16/2020    Lab Results  Component Value Date/Time   HGBA1C 5.7 (H) 09/04/2020 09:28 AM   HGBA1C 6.1 (H) 05/02/2020 08:49 AM    Last diabetic Eye exam:  Lab Results  Component Value Date/Time   HMDIABEYEEXA Retinopathy (A) 10/21/2020 12:00 AM    Last diabetic Foot exam: No results found for: HMDIABFOOTEX   Lab Results  Component Value Date   CHOL 100 05/02/2020   HDL 34 (L) 05/02/2020   LDLCALC 55  05/02/2020   TRIG 42 05/02/2020   CHOLHDL 3.7 12/16/2016    Hepatic Function Latest Ref Rng & Units 06/09/2019 01/09/2019 01/02/2019  Total Protein 6.0 - 8.5 g/dL 7.1 - 6.2  Albumin 3.8 - 4.8 g/dL 4.5 4.5 4.2  AST 0 - 40 IU/L '26 18 16  ' ALT 0 - 44 IU/L 34 18 17  Alk Phosphatase 39 - 117 IU/L 75 87 75  Total Bilirubin 0.0 - 1.2 mg/dL 0.5 0.6 0.5  Bilirubin, Direct 0.00 - 0.40 mg/dL 0.14 0.18 0.16    Lab Results  Component Value Date/Time   TSH 3.040 01/09/2019 12:06 PM    CBC Latest Ref Rng & Units 02/16/2020 01/09/2019  WBC 4.0 - 10.5 K/uL 6.2 7.1  Hemoglobin 13.0 - 17.0 g/dL 12.6(L) 13.1  Hematocrit 39.0 - 52.0 % 37.1(L) 38.2  Platelets 150 - 400 K/uL 157 172    No results found for: VD25OH  Clinical ASCVD: No  The ASCVD Risk score Mark Bauer DC Jr., et al., 2013) failed to calculate for the following reasons:   The valid total cholesterol range is 130 to 320 mg/dL    Depression screen [facility] 2/9 09/04/2020 06/24/2020 05/02/2020  Decreased Interest 0 0 0  Down, Depressed, Hopeless 0 0 0  PHQ - 2 Score 0 0 0  Altered sleeping 0 0 0  Tired, decreased energy 0 0 0  Change in appetite 0 0 0  Feeling bad or failure about yourself  0 0 0  Trouble concentrating 0  0 0  Moving slowly or fidgety/restless 0 0 0  Suicidal thoughts 0 0 0  PHQ-9 Score 0 0 0  Difficult doing work/chores - - -     CHA2DS2/VAS Stroke Risk Points  Current as of 3 hours ago     3 >= 2 Points: High Risk  1 - 1.99 Points: Medium Risk  0 Points: Low Risk    Last Change: N/A      Details    This score determines the patient's risk of having a stroke if the  patient has atrial fibrillation.       Points Metrics  0 Has Congestive Heart Failure:  No    Current as of 3 hours ago  0 Has Vascular Disease:  No    Current as of 3 hours ago  1 Has Hypertension:  Yes    Current as of 3 hours ago  1 Age:  70    Current as of 3 hours ago  1 Has Diabetes:  Yes    Current as of 3 hours ago  0 Had Stroke:  No  Had  TIA:  No  Had Thromboembolism:  No    Current as of 3 hours ago  0 Male:  No    Current as of 3 hours ago            Social History   Tobacco Use  Smoking Status Never Smoker  Smokeless Tobacco Never Used   BP Readings from Last 3 Encounters:  09/04/20 120/80  06/24/20 130/62  06/06/20 103/66   Pulse Readings from Last 3 Encounters:  09/04/20 64  06/24/20 72  06/06/20 80   Wt Readings from Last 3 Encounters:  09/04/20 258 lb (117 kg)  06/24/20 273 lb (123.8 kg)  06/06/20 262 lb 9.6 oz (119.1 kg)   BMI Readings from Last 3 Encounters:  09/04/20 34.99 kg/m  06/24/20 37.03 kg/m  06/06/20 35.61 kg/m    Assessment/Interventions: Review of patient past medical history, allergies, medications, health status, including review of consultants reports, laboratory and other test data, was performed as part of comprehensive evaluation and provision of chronic care management services.   SDOH:  (Social Determinants of Health) assessments and interventions performed: No  SDOH Screenings   Alcohol Screen: Low Risk   . Last Alcohol Screening Score (AUDIT): 0  Depression (PHQ2-9): Low Risk   . PHQ-2 Score: 0  Financial Resource Strain: Low Risk   . Difficulty of Paying Living Expenses: Not hard at all  Food Insecurity: No Food Insecurity  . Worried About Charity fundraiser in the Last Year: Never true  . Ran Out of Food in the Last Year: Never true  Housing: Low Risk   . Last Housing Risk Score: 0  Physical Activity: Inactive  . Days of Exercise per Week: 0 days  . Minutes of Exercise per Session: 0 min  Social Connections: Socially Isolated  . Frequency of Communication with Friends and Family: More than three times a week  . Frequency of Social Gatherings with Friends and Family: Never  . Attends Religious Services: Never  . Active Member of Clubs or Organizations: No  . Attends Archivist Meetings: Never  . Marital Status: Never married  Stress: No  Stress Concern Present  . Feeling of Stress : Not at all  Tobacco Use: Low Risk   . Smoking Tobacco Use: Never Smoker  . Smokeless Tobacco Use: Never Used  Transportation Needs: No Transportation Needs  .  Lack of Transportation (Medical): No  . Lack of Transportation (Non-Medical): No      Immunization History  Administered Date(s) Administered  . Influenza, High Dose Seasonal PF 05/04/2019  . Influenza,inj,Quad PF,6+ Mos 04/23/2017, 05/02/2018  . Influenza-Unspecified 05/04/2019, 04/29/2020  . PFIZER(Purple Top)SARS-COV-2 Vaccination 09/15/2019, 10/11/2019  . Pneumococcal Conjugate-13 06/09/2019  . Pneumococcal Polysaccharide-23 12/27/2015  . Tdap 07/26/2017    Conditions to be addressed/monitored:  Hypertension, Hyperlipidemia, Diabetes, Atrial Fibrillation, GERD and Chronic Kidney Disease  Care Plan : Rosita  Updates made by Mark Bauer, [place] since 11/05/2020 12:00 AM    Problem: Afib, HTN, CKD IV, HLD, DM, GERD , gout   Priority: High    Goal: Disease Management   Start Date: 11/05/2020  This Visit's Progress: On track  Priority: High  Note:   Current Barriers:  . Unable to independently monitor therapeutic efficacy . Suboptimal therapeutic regimen for diabetes, afib  Pharmacist Clinical Goal(s):  Marland Kitchen Patient will achieve adherence to monitoring guidelines and medication adherence to achieve therapeutic efficacy . maintain control of diabetes as evidenced by lab values  . adhere to plan to optimize therapeutic regimen for a fib as evidenced by report of adherence to recommended medication management changes through collaboration with PharmD and provider.   Interventions: . 1:1 collaboration with Mark Patch, MD regarding development and update of comprehensive plan of care as evidenced by provider attestation and co-signature . Inter-disciplinary care team collaboration (see longitudinal plan of care) . Comprehensive medication review performed;  medication list updated in electronic medical record    Patient Goals/Self-Care Activities . Patient will:  - take medications as prescribed check glucose daily, document, and provide at future appointments check blood pressure 2-3 times weekly, document, and provide at future appointments  Follow Up Plan: Telephone follow up appointment with care management team member scheduled for:  BMP Latest Ref Rng & Units 02/23/2020 02/16/2020 09/27/2019  Glucose 70 - 99 mg/dL - 109(H) -  BUN 8 - 23 mg/dL - 78(H) 54(A)  Creatinine 0.61 - 1.24 mg/dL - 6.46(H) 4.2(A)  BUN/Creat Ratio 10 - 24 - - -  Sodium 135 - 145 mmol/L - 138 -  Potassium 3.5 - 5.1 mmol/L 4.7 5.1 -  Chloride 98 - 111 mmol/L - 106 -  CO2 22 - 32 mmol/L - 21(L) -  Calcium 8.9 - 10.3 mg/dL - 9.6 -    Hypertension/A fib/CKD IV BP Readings from Last 3 Encounters:  09/04/20 120/80  06/24/20 130/62  06/06/20 103/66   . Pharmacist Clinical Goal(s): o Over the next 90 days, patient will work with PharmD and providers to achieve BP goal <130/80 . Current regimen:  o Warfarin 4 mg qd (adjusted based on INR) . Spironolactone 25 mg . Metoprolol succinate 100 mg qd . Amlodipine 10 mg qd . Torsemide 5 mg qd prn swelling (uses every other week) . Interventions: o Provided diet and exercise counseling. o Reviewed signs/symptoms of bleeding o Educated patient on apixaban as alternative option to warfarin. Will mail information to the patient. Patient would like to discuss with PCP at next visit. He is frustrated with monitoring requirements of warfarin. o Patient has decided on in office HD when necessary Patient self care activities - Over the next 90 days, patient will: o Check BP 3-4 times weekly, document, and provide at future appointments o Ensure daily salt intake < 2300 mg/day  Hyperlipidemia Lab Results  Component Value Date/Time   LDLCALC 55 05/02/2020 08:49 AM   .  Pharmacist Clinical Goal(s): o Over the next 90 days,  patient will work with PharmD and providers to maintain LDL goal < 70 . Current regimen:  o Lovastatin 20 mg qd . Interventions: o Provided diet and exercise counseling. . Patient self care activities - Over the next 90 days, patient will: o Continue heart healthy, low-sodium diet. o Attend all scheduled appointments  Diabetes Lab Results  Component Value Date/Time   HGBA1C 5.7 (H) 09/04/2020 09:28 AM   HGBA1C 6.1 (H) 05/02/2020 08:49 AM   . Pharmacist Clinical Goal(s): o Over the next 90 days, patient will work with PharmD and providers to maintain A1c goal <7% . Current regimen:  o Victoza 1.74m daily  . Interventions: o Educated patient on weekly alternative to Victoza.  o Verified patient was able to get new meter and strips o Praised patient for continued glucose control  . Patient self care activities - Over the next 90 days, patient will: o Check blood sugar once daily, document, and provide at future appointments o Contact provider with any episodes of hypoglycemia  Medication management . Pharmacist Clinical Goal(s): o Over the next 90 days, patient will work with PharmD and providers to maintain optimal medication adherence . Current pharmacy: [facility] . Interventions o Comprehensive medication review performed. o Continue current medication management strategy . Patient self care activities - Over the next 90 days, patient will: o Focus on medication adherence by fill dates o Take medications as prescribed o Report any questions or concerns to PharmD and/or provider(s)        Medication Assistance: Patient exceeds income limits for Victoza patient assistance.  [facility]  [id number] [address] [place]: [phone]: [phone] [facility] [place] [facility] [place]: [phone]: [phone] pill box? Yes Pt endorses 99% compliance  We discussed: Current pharmacy is preferred with insurance plan and patient is satisfied with pharmacy services Patient decided to: Continue current medication management strategy  Care Plan and Follow Up Patient Decision:  Patient agrees to Care Plan and Follow-up.  Plan: Telephone follow up appointment with care management team member scheduled for:  2 months PharmD   JuJunita PushHaKenton KingfisherharmD, BCRockland [phone]

## 2020-11-05 NOTE — Patient Instructions (Addendum)
Visit Information  It was a pleasure speaking with you today. Thank you for letting me be part of your clinical team. Please call with any questions or concerns.   Goals Addressed            This Visit's Progress   . Monitor and Manage My Blood Sugar-Diabetes Type 2       Timeframe:  Long-Range Goal Priority:  High Start Date:                             Expected End Date:                       Follow Up Date 2 month follow up    - check blood sugar at prescribed times - check blood sugar if I feel it is too high or too low - take the blood sugar meter to all doctor visits    Why is this important?    Checking your blood sugar at home helps to keep it from getting very high or very low.   Writing the results in a diary or log helps the doctor know how to care for you.   Your blood sugar log should have the time, date and the results.   Also, write down the amount of insulin or other medicine that you take.   Other information, like what you ate, exercise done and how you were feeling, will also be helpful.     Notes:     . Pharmacy care plan       CARE PLAN ENTRY (see longitudinal plan of care for additional care plan information)  Current Barriers:  . Chronic Disese Management support, education, and care coordination needs related to Hypertension, Hyperlipidemia, Diabetes, Atrial Fibrillation, GERD, and Gout   Hypertension/A fib BP Readings from Last 3 Encounters:  09/04/20 120/80  06/24/20 130/62  06/06/20 103/66   . Pharmacist Clinical Goal(s): o Over the next 90 days, patient will work with PharmD and providers to achieve BP goal <130/80 . Current regimen:  o Warfarin 4 mg qd (adjusted based on INR) . Spironolactone 25 mg . Metoprolol succinate . Amlodipine 10 mg qd . Torsemide 10 mg qd prn swelling ( not needing currently) . Interventions: o Provided diet and exercise counseling. o Reviewed signs/symptoms of bleeding Patient self care activities -  Over the next 90 days, patient will: o Check BP 3-4 times weekly, document, and provide at future appointments o Ensure daily salt intake < 2300 mg/day  Hyperlipidemia Lab Results  Component Value Date/Time   LDLCALC 55 05/02/2020 08:49 AM   . Pharmacist Clinical Goal(s): o Over the next 90 days, patient will work with PharmD and providers to maintain LDL goal < 70 . Current regimen:  o Lovastatin 20 mg qd . Interventions: o Provided diet and exercise counseling. . Patient self care activities - Over the next 90 days, patient will: o Continue heart healthy, low-sodium diet. o Attend all scheduled appointments  Diabetes Lab Results  Component Value Date/Time   HGBA1C 5.7 (H) 09/04/2020 09:28 AM   HGBA1C 6.1 (H) 05/02/2020 08:49 AM   . Pharmacist Clinical Goal(s): o Over the next 90 days, patient will work with PharmD and providers to maintain A1c goal <7% . Current regimen:  o Victoza 1.8mg  daily  . Interventions: o Collaborate with prescriber to send in RX for new meter and supplies o Will initiate PAP for  Victoza to help alleviate financial burden. . Patient self care activities - Over the next 90 days, patient will: o Check blood sugar once daily, document, and provide at future appointments o Contact provider with any episodes of hypoglycemia  Medication management . Pharmacist Clinical Goal(s): o Over the next 90 days, patient will work with PharmD and providers to maintain optimal medication adherence . Current pharmacy: [facility] . Interventions o Comprehensive medication review performed. o Continue current medication management strategy . Patient self care activities - Over the next 90 days, patient will: o Focus on medication adherence by fill dates o Take medications as prescribed o Report any questions or concerns to PharmD and/or provider(s)  Please see past updates related to this goal by clicking on the "Past Updates" button in the selected goal       . Track and Manage My Blood Pressure-Hypertension       Timeframe:  Long-Range Goal Priority:  High Start Date:                             Expected End Date:                       Follow Up Date 2 month follow up   - check blood pressure 3 times per week - write blood pressure results in a log or diary    Why is this important?    You won't feel high blood pressure, but it can still hurt your blood vessels.   High blood pressure can cause heart or kidney problems. It can also cause a stroke.   Making lifestyle changes like losing a little weight or eating less salt will help.   Checking your blood pressure at home and at different times of the day can help to control blood pressure.   If the doctor prescribes medicine remember to take it the way the doctor ordered.   Call the office if you cannot afford the medicine or if there are questions about it.     Notes:        The patient verbalized understanding of instructions, educational materials, and care plan provided today and agreed to receive a mailed copy of patient instructions, educational materials, and care plan.   Telephone follow up appointment with pharmacy team member scheduled for: 01/09/21 at 10:00  Junita Push. Kenton Kingfisher PharmD, BCPS Clinical Pharmacist [phone]  Tips for Eating Away From Home If You Have Diabetes Controlling your blood sugar (glucose) levels can be challenging when you do not prepare your own meals. The following tips can help you manage your diabetes when you eat away from home. If you have questions or if you need help, work with your health care provider or diet and nutrition specialist (dietitian). Planning ahead Plan ahead if you know you will be eating away from home:  Try to eat your meals and snacks at about the same time each day. If you know your meal is going to be later than normal, make sure you have a small snack. Being very hungry can cause you to make unhealthy food  choices.  Make a list of restaurants near you that offer healthy choices. If a restaurant has a carry-out menu, take the menu home and plan what you will order ahead of time.  Look up the restaurant you want to eat at online. Many chain and fast-food restaurants list nutritional information online. Use this  information to choose the healthiest options and to calculate how many carbohydrates will be in your meal.  Use a carbohydrate-counting book or mobile app to look up the carbohydrate content and serving size of the foods you want to eat. Free foods A "free food" is any food or drink that has less than 5 grams of carbohydrates and less than 20 calories per serving. These food are high in fiber and nutrients and low in calories, carbohydrates, and fats. Free foods include:  Non-starchy vegetables, such as carrots, broccoli, celery, lettuce, or green beans.  Non-sugar drinks, such as water, unsweetened coffee, or unsweetened tea.  Low-calorie salad dressings.  Sugar-free gelatin. Starting meals with a salad full of vegetables is a healthy choice that includes a lot of free foods. Avoid high-calorie salad toppings like bacon, cheese, and high-fat dressings. Ask for your salad dressing to be served on the side so that you dip your fork in the dressing and then in the salad. This allows you to control how much dressing you eat and still get the flavor with every bite. Choices to control carbohydrates  Ask your server to take away the bread basket or chips from your table.  Choose light yogurt or [place] yogurt instead of non-fat sweetened yogurt.  Order fresh fruit. A salad bar often offers fresh fruit choices. Avoid canned fruit because it is usually packed in sugar or syrup.  Order a salad, and ask for dressing on the side.  Ask for substitutes. For example, if your meal comes with french fries, ask for a side salad or steamed veggies instead. If a meal comes with fried chicken, ask for  grilled chicken instead.   Beverages  Choose drinks that are low in calories and sugar, such as: ? Water. ? Unsweetened tea or coffee. ? Lowfat milk.  Avoid the following drinks: ? Alcoholic beverages. ? Regular (not diet) sodas. Other tips  If you take insulin, wait to take your insulin once your food arrives to your table. This will ensure that your insulin and your food are timed correctly.  Become familiar with serving sizes and learn to recognize how many servings are in a portion. Restaurant portions are typically two to three times larger than what you really need.  Ask your server for a to-go box at the beginning of the meal. When your food comes, leave the amount you should have on your plate, and put the rest in the to-go box so that you are not tempted to eat too much.  Consider splitting an entree with someone and ordering a side salad.  Avoid buffets. They are typically too tempting and result in overeating. Where to find more information  American Diabetes Association: www.diabetes.org  American Association of Diabetes Educators: www.diabeteseducator.org Summary  Plan ahead when eating away from home.  Try to eat your meals and snacks at about the same time each day. If you know your meal is going to be later than normal, make sure you have a small snack. Being very hungry can cause you to make unhealthy food choices.  Ask for substitutes. For example, if your meal comes with french fries, ask for a side salad or steamed veggies instead. If a meal comes with fried chicken, ask for grilled chicken instead.  Ask for a to-go box when you order your meal. Divide your meal before you start eating. This information is not intended to replace advice given to you by your health care provider. Make sure you discuss any  questions you have with your health care provider. Document Revised: 10/14/2016 Document Reviewed: 10/14/2016 Elsevier Patient Education  2021 [place] for Eating Away From Home If You Have Diabetes Controlling your blood sugar (glucose) levels can be challenging when you do not prepare your own meals. The following tips can help you manage your diabetes when you eat away from home. If you have questions or if you need help, work with your health care provider or diet and nutrition specialist (dietitian). Planning ahead Plan ahead if you know you will be eating away from home:  Try to eat your meals and snacks at about the same time each day. If you know your meal is going to be later than normal, make sure you have a small snack. Being very hungry can cause you to make unhealthy food choices.  Make a list of restaurants near you that offer healthy choices. If a restaurant has a carry-out menu, take the menu home and plan what you will order ahead of time.  Look up the restaurant you want to eat at online. Many chain and fast-food restaurants list nutritional information online. Use this information to choose the healthiest options and to calculate how many carbohydrates will be in your meal.  Use a carbohydrate-counting book or mobile app to look up the carbohydrate content and serving size of the foods you want to eat. Free foods A "free food" is any food or drink that has less than 5 grams of carbohydrates and less than 20 calories per serving. These food are high in fiber and nutrients and low in calories, carbohydrates, and fats. Free foods include:  Non-starchy vegetables, such as carrots, broccoli, celery, lettuce, or green beans.  Non-sugar drinks, such as water, unsweetened coffee, or unsweetened tea.  Low-calorie salad dressings.  Sugar-free gelatin. Starting meals with a salad full of vegetables is a healthy choice that includes a lot of free foods. Avoid high-calorie salad toppings like bacon, cheese, and high-fat dressings. Ask for your salad dressing to be served on the side so that you dip your fork in the dressing  and then in the salad. This allows you to control how much dressing you eat and still get the flavor with every bite. Choices to control carbohydrates  Ask your server to take away the bread basket or chips from your table.  Choose light yogurt or [place] yogurt instead of non-fat sweetened yogurt.  Order fresh fruit. A salad bar often offers fresh fruit choices. Avoid canned fruit because it is usually packed in sugar or syrup.  Order a salad, and ask for dressing on the side.  Ask for substitutes. For example, if your meal comes with french fries, ask for a side salad or steamed veggies instead. If a meal comes with fried chicken, ask for grilled chicken instead.   Beverages  Choose drinks that are low in calories and sugar, such as: ? Water. ? Unsweetened tea or coffee. ? Lowfat milk.  Avoid the following drinks: ? Alcoholic beverages. ? Regular (not diet) sodas. Other tips  If you take insulin, wait to take your insulin once your food arrives to your table. This will ensure that your insulin and your food are timed correctly.  Become familiar with serving sizes and learn to recognize how many servings are in a portion. Restaurant portions are typically two to three times larger than what you really need.  Ask your server for a to-go box at the beginning of the meal.  When your food comes, leave the amount you should have on your plate, and put the rest in the to-go box so that you are not tempted to eat too much.  Consider splitting an entree with someone and ordering a side salad.  Avoid buffets. They are typically too tempting and result in overeating. Where to find more information  American Diabetes Association: www.diabetes.org  American Association of Diabetes Educators: www.diabeteseducator.org Summary  Plan ahead when eating away from home.  Try to eat your meals and snacks at about the same time each day. If you know your meal is going to be later than normal, make  sure you have a small snack. Being very hungry can cause you to make unhealthy food choices.  Ask for substitutes. For example, if your meal comes with french fries, ask for a side salad or steamed veggies instead. If a meal comes with fried chicken, ask for grilled chicken instead.  Ask for a to-go box when you order your meal. Divide your meal before you start eating. This information is not intended to replace advice given to you by your health care provider. Make sure you discuss any questions you have with your health care provider. Document Revised: 10/14/2016 Document Reviewed: 10/14/2016 Elsevier Patient Education  2021 [employer].

## 2020-11-25 ENCOUNTER — Ambulatory Visit: Payer: Medicare Other

## 2020-11-25 DIAGNOSIS — D6859 Other primary thrombophilia: Secondary | ICD-10-CM

## 2020-11-25 DIAGNOSIS — Z7901 Long term (current) use of anticoagulants: Secondary | ICD-10-CM

## 2020-11-25 NOTE — Progress Notes (Signed)
pt

## 2020-11-26 LAB — PROTIME-INR
INR: 2 — ABNORMAL HIGH (ref 0.9–1.2)
Prothrombin Time: 20.8 s — ABNORMAL HIGH (ref 9.1–12.0)

## 2020-11-30 ENCOUNTER — Other Ambulatory Visit: Payer: Self-pay | Admitting: Family Medicine

## 2020-11-30 NOTE — Telephone Encounter (Signed)
Requested medication (s) are due for refill today: -  Requested medication (s) are on the active medication list: historical med  Last refill:  10/30/20  Future visit scheduled: yes  Notes to clinic:  historical provider   Requested Prescriptions  Pending Prescriptions Disp Refills   ONETOUCH ULTRA test strip [Pharmacy Med Name: [place] TEST STRP] 100 strip     Sig: USE AS [place]      Endocrinology: Diabetes - Testing Supplies Passed - 11/30/2020  9:17 AM      Passed - Valid encounter within last 12 months    Recent Outpatient Visits           2 months ago Type 2 diabetes mellitus with diabetic nephropathy, without long-term current use of insulin ([place])   [facility] Juline Patch, MD   5 months ago Ganglion cyst of finger of left hand   [facility] Juline Patch, MD   7 months ago Type 2 diabetes mellitus with diabetic nephropathy, without long-term current use of insulin ([place])   [facility] Juline Patch, MD   11 months ago Hyperuricemia   [facility] Juline Patch, MD   1 year ago Type 2 diabetes mellitus with diabetic nephropathy, without long-term current use of insulin ([place])   [facility] Juline Patch, MD       Future Appointments             In 1 month Juline Patch, MD [facility], [facility]

## 2020-12-02 ENCOUNTER — Other Ambulatory Visit: Payer: Self-pay | Admitting: Family Medicine

## 2020-12-02 NOTE — Telephone Encounter (Signed)
   Notes to clinic:  : Script Clarification:NEED AMOUNT OF TIMES DAILY TO BILL PART B.   Requested Prescriptions  Pending Prescriptions Disp Refills   ONETOUCH ULTRA test strip [Pharmacy Med Name: [place] TEST STRP] 100 strip 0    Sig: USE AS [place] DAY      Endocrinology: Diabetes - Testing Supplies Passed - 12/02/2020  8:19 AM      Passed - Valid encounter within last 12 months    Recent Outpatient Visits           2 months ago Type 2 diabetes mellitus with diabetic nephropathy, without long-term current use of insulin ([place])   [facility] Juline Patch, MD   5 months ago Ganglion cyst of finger of left hand   [facility] Juline Patch, MD   7 months ago Type 2 diabetes mellitus with diabetic nephropathy, without long-term current use of insulin ([place])   [facility] Juline Patch, MD   12 months ago Hyperuricemia   [facility] Juline Patch, MD   1 year ago Type 2 diabetes mellitus with diabetic nephropathy, without long-term current use of insulin ([place])   [facility] Juline Patch, MD       Future Appointments             In 1 month Juline Patch, MD [facility], [facility]

## 2020-12-18 DIAGNOSIS — E876 Hypokalemia: Secondary | ICD-10-CM | POA: Diagnosis not present

## 2020-12-18 DIAGNOSIS — N184 Chronic kidney disease, stage 4 (severe): Secondary | ICD-10-CM | POA: Diagnosis not present

## 2020-12-18 DIAGNOSIS — D631 Anemia in chronic kidney disease: Secondary | ICD-10-CM | POA: Diagnosis not present

## 2020-12-18 DIAGNOSIS — N2581 Secondary hyperparathyroidism of renal origin: Secondary | ICD-10-CM | POA: Diagnosis not present

## 2020-12-18 DIAGNOSIS — M1A9XX0 Chronic gout, unspecified, without tophus (tophi): Secondary | ICD-10-CM | POA: Diagnosis not present

## 2020-12-18 DIAGNOSIS — R252 Cramp and spasm: Secondary | ICD-10-CM | POA: Diagnosis not present

## 2020-12-18 DIAGNOSIS — Q613 Polycystic kidney, unspecified: Secondary | ICD-10-CM | POA: Diagnosis not present

## 2020-12-18 DIAGNOSIS — I10 Essential (primary) hypertension: Secondary | ICD-10-CM | POA: Diagnosis not present

## 2020-12-18 DIAGNOSIS — N185 Chronic kidney disease, stage 5: Secondary | ICD-10-CM | POA: Diagnosis not present

## 2020-12-21 ENCOUNTER — Other Ambulatory Visit: Payer: Self-pay | Admitting: Family Medicine

## 2020-12-21 DIAGNOSIS — K219 Gastro-esophageal reflux disease without esophagitis: Secondary | ICD-10-CM

## 2020-12-21 DIAGNOSIS — E790 Hyperuricemia without signs of inflammatory arthritis and tophaceous disease: Secondary | ICD-10-CM

## 2020-12-21 NOTE — Telephone Encounter (Signed)
Requested medications are due for refill today yes  Requested medications are on the active medication list yes  Last refill 10/05/20  Last visit 04/2020  Future visit scheduled no  Notes to clinic Failed protocol due to no uric acid within 360 days, no upcoming visit scheduled.

## 2020-12-26 ENCOUNTER — Other Ambulatory Visit: Payer: Self-pay

## 2020-12-26 ENCOUNTER — Ambulatory Visit (INDEPENDENT_AMBULATORY_CARE_PROVIDER_SITE_OTHER): Payer: Medicare Other

## 2020-12-26 DIAGNOSIS — Z7901 Long term (current) use of anticoagulants: Secondary | ICD-10-CM | POA: Diagnosis not present

## 2020-12-26 DIAGNOSIS — D6859 Other primary thrombophilia: Secondary | ICD-10-CM | POA: Diagnosis not present

## 2020-12-26 NOTE — Progress Notes (Deleted)
Ordered labs

## 2020-12-26 NOTE — Progress Notes (Deleted)
Order printed

## 2020-12-27 LAB — PROTIME-INR
INR: 2.7 — ABNORMAL HIGH (ref 0.9–1.2)
Prothrombin Time: 27.6 s — ABNORMAL HIGH (ref 9.1–12.0)

## 2021-01-01 DIAGNOSIS — Z6834 Body mass index (BMI) 34.0-34.9, adult: Secondary | ICD-10-CM | POA: Diagnosis not present

## 2021-01-01 DIAGNOSIS — N2581 Secondary hyperparathyroidism of renal origin: Secondary | ICD-10-CM | POA: Diagnosis not present

## 2021-01-01 DIAGNOSIS — N185 Chronic kidney disease, stage 5: Secondary | ICD-10-CM | POA: Diagnosis not present

## 2021-01-01 DIAGNOSIS — R252 Cramp and spasm: Secondary | ICD-10-CM | POA: Diagnosis not present

## 2021-01-01 DIAGNOSIS — Q613 Polycystic kidney, unspecified: Secondary | ICD-10-CM | POA: Diagnosis not present

## 2021-01-01 DIAGNOSIS — M1A9XX0 Chronic gout, unspecified, without tophus (tophi): Secondary | ICD-10-CM | POA: Diagnosis not present

## 2021-01-01 DIAGNOSIS — I10 Essential (primary) hypertension: Secondary | ICD-10-CM | POA: Diagnosis not present

## 2021-01-08 ENCOUNTER — Encounter: Payer: Self-pay | Admitting: Family Medicine

## 2021-01-08 ENCOUNTER — Ambulatory Visit (INDEPENDENT_AMBULATORY_CARE_PROVIDER_SITE_OTHER): Payer: Medicare Other | Admitting: Family Medicine

## 2021-01-08 ENCOUNTER — Other Ambulatory Visit: Payer: Self-pay

## 2021-01-08 VITALS — BP 120/70 | HR 64 | Ht 72.0 in | Wt 247.0 lb

## 2021-01-08 DIAGNOSIS — E119 Type 2 diabetes mellitus without complications: Secondary | ICD-10-CM

## 2021-01-08 DIAGNOSIS — I482 Chronic atrial fibrillation, unspecified: Secondary | ICD-10-CM

## 2021-01-08 DIAGNOSIS — Z7901 Long term (current) use of anticoagulants: Secondary | ICD-10-CM | POA: Diagnosis not present

## 2021-01-08 NOTE — Progress Notes (Signed)
Date:  01/08/2021   Name:  Mark Bauer   DOB:  [birth year]   MRN:  [id number]   Chief Complaint: Diabetes and Atrial Fibrillation  Diabetes He presents for his follow-up diabetic visit. He has type 2 diabetes mellitus. The initial diagnosis of diabetes was made 20 years ago. His disease course has been stable. There are no hypoglycemic associated symptoms. Pertinent negatives for hypoglycemia include no confusion, dizziness, headaches, hunger, mood changes, nervousness/anxiousness, pallor, seizures, sleepiness, speech difficulty, sweats or tremors. There are no diabetic associated symptoms. Pertinent negatives for diabetes include no chest pain and no polydipsia. There are no hypoglycemic complications. Symptoms are stable. There are no diabetic complications. There are no known risk factors for coronary artery disease.  Atrial Fibrillation Symptoms are negative for chest pain, dizziness, palpitations and shortness of breath. Past medical history includes atrial fibrillation.   Lab Results  Component Value Date   CREATININE 6.46 (H) 02/16/2020   BUN 78 (H) 02/16/2020   NA 138 02/16/2020   K 4.7 02/23/2020   CL 106 02/16/2020   CO2 21 (L) 02/16/2020   Lab Results  Component Value Date   CHOL 100 05/02/2020   HDL 34 (L) 05/02/2020   LDLCALC 55 05/02/2020   TRIG 42 05/02/2020   CHOLHDL 3.7 12/16/2016   Lab Results  Component Value Date   TSH 3.040 01/09/2019   Lab Results  Component Value Date   HGBA1C 5.7 (H) 09/04/2020   Lab Results  Component Value Date   WBC 6.2 02/16/2020   HGB 12.6 (L) 02/16/2020   HCT 37.1 (L) 02/16/2020   MCV 94.6 02/16/2020   PLT 157 02/16/2020   Lab Results  Component Value Date   ALT 34 06/09/2019   AST 26 06/09/2019   ALKPHOS 75 06/09/2019   BILITOT 0.5 06/09/2019     Review of Systems  Constitutional:  Negative for chills and fever.  HENT:  Negative for drooling, ear discharge, ear pain and sore throat.   Respiratory:  Negative  for cough, shortness of breath and wheezing.   Cardiovascular:  Negative for chest pain, palpitations and leg swelling.  Gastrointestinal:  Negative for abdominal pain, blood in stool, constipation, diarrhea and nausea.  Endocrine: Negative for polydipsia.  Genitourinary:  Negative for dysuria, frequency, hematuria and urgency.  Musculoskeletal:  Negative for back pain, myalgias and neck pain.  Skin:  Negative for pallor and rash.  Allergic/Immunologic: Negative for environmental allergies.  Neurological:  Negative for dizziness, tremors, seizures, speech difficulty and headaches.  Hematological:  Does not bruise/bleed easily.  Psychiatric/Behavioral:  Negative for confusion and suicidal ideas. The patient is not nervous/anxious.    Patient Active Problem List   Diagnosis Date Noted   Complication of vascular access for dialysis 06/06/2020   Fistula 02/23/2020   Polycystic liver disease 01/10/2020   Polycystic kidney disease 88/91/6945   Metabolic acidosis 03/88/8280   CKD stage 4 due to type 2 diabetes mellitus ([place]) 01/25/2018   Type 2 diabetes mellitus with diabetic nephropathy, without long-term current use of insulin ([place]) 01/25/2018   Secondary hyperparathyroidism of renal origin ([place]) 08/19/2017   Chronic atrial fibrillation ([place]) 01/06/2017   Type 2 diabetes mellitus without complication, without long-term current use of insulin ([place]) 01/06/2017   Essential hypertension 01/06/2017   Hyperuricemia 01/06/2017   Mixed hyperlipidemia 01/06/2017   Gastroesophageal reflux disease 01/06/2017   Obesity 12/26/2012    Allergies  Allergen Reactions   Ciprofloxacin Other (See Comments)    hallucination  Pollen Extract Other (See Comments)    Sneezing, watery eyes, etc.   Caffeine Palpitations    palpatations    Past Surgical History:  Procedure Laterality Date   BASCILIC VEIN TRANSPOSITION Left 02/23/2020   Procedure: BASCILIC VEIN TRANSPOSITION (SINGLE STAGE);  Surgeon:  Katha Cabal, MD;  Location: [facility] ORS;  Service: Vascular;  Laterality: Left;   COLONOSCOPY  2012   hemorrhoids   melanoma removal     forehead    Social History   Tobacco Use   Smoking status: Never   Smokeless tobacco: Never  Vaping Use   Vaping Use: Never used  Substance Use Topics   Alcohol use: No    Alcohol/week: 0.0 standard drinks   Drug use: No     Medication list has been reviewed and updated.  Current Meds  Medication Sig   allopurinol (ZYLOPRIM) 100 MG tablet TAKE 1 TABLET DAILY   amLODipine (NORVASC) 10 MG tablet Take 1 tablet (10 mg total) by mouth daily.   b complex vitamins tablet Take 1 tablet by mouth daily.   blood glucose meter kit and supplies Dispense based on patient and insurance preference. Use up to four times daily as directed. (FOR ICD-10 E11.9).   Cholecalciferol 50 MCG (2000 UT) CAPS Take 2,000 Units by mouth daily.    DHA-EPA-VIT B6-B12-FOLIC ACID PO Take 2,426 Units by mouth daily.   glucose blood (ONETOUCH ULTRA) test strip One test daily   Lancets (ONETOUCH DELICA PLUS STMHDQ22W) MISC USE AS DIRECTED EVERY DAY   liraglutide (VICTOZA) 18 MG/3ML SOPN INJECT SUBCUTANEOUSLY 1.8MG ONCE A DAY   loratadine (CLARITIN) 10 MG tablet Take 10 mg by mouth at bedtime. otc   losartan (COZAAR) 100 MG tablet 50 mg daily. Nephrology   lovastatin (MEVACOR) 20 MG tablet Take 1 tablet (20 mg total) by mouth daily.   metoprolol succinate (TOPROL-XL) 100 MG 24 hr tablet Take 1 tablet (100 mg total) by mouth daily. Take with or immediately following a meal.   NOVOFINE PEN NEEDLE 32G X 6 MM MISC USE AND DISCARD 1 PEN      NEEDLE SUBCUTANEOUSLY DAILYAS DIRECTED   pantoprazole (PROTONIX) 40 MG tablet TAKE 1 TABLET DAILY   spironolactone (ALDACTONE) 25 MG tablet Take 25 mg by mouth daily.   torsemide (DEMADEX) 20 MG tablet Take 20 mg by mouth daily as needed (ankle swelling/fluid retention.). Takes  For swelling/ Nephrology   warfarin (COUMADIN) 2 MG tablet Take  2 tablets (4 mg total) by mouth daily.   warfarin (JANTOVEN) 5 MG tablet Take 1 tablet (5 mg total) by mouth daily.   [DISCONTINUED] VALERIAN ROOT PO Take 1 each by mouth daily.    PHQ 2/9 Scores 01/08/2021 09/04/2020 06/24/2020 05/02/2020  PHQ - 2 Score 0 0 0 0  PHQ- 9 Score 0 0 0 0    GAD 7 : Generalized Anxiety Score 01/08/2021 09/04/2020 06/24/2020 05/02/2020  Nervous, Anxious, on Edge 1 0 0 0  Control/stop worrying 1 0 0 0  Worry too much - different things 0 0 0 0  Trouble relaxing 0 0 0 0  Restless 0 0 0 0  Easily annoyed or irritable 0 0 0 0  Afraid - awful might happen 0 0 0 0  Total GAD 7 Score 2 0 0 0  Anxiety Difficulty Not difficult at all - - -    BP Readings from Last 3 Encounters:  01/08/21 120/70  09/04/20 120/80  06/24/20 130/62    Physical Exam Vitals  and nursing note reviewed.  HENT:     Head: Normocephalic.     Right Ear: Tympanic membrane, ear canal and external ear normal. There is impacted cerumen.     Left Ear: Tympanic membrane, ear canal and external ear normal. There is no impacted cerumen.     Nose: Nose normal. No congestion or rhinorrhea.  Eyes:     General: No scleral icterus.       Right eye: No discharge.        Left eye: No discharge.     Conjunctiva/sclera: Conjunctivae normal.     Pupils: Pupils are equal, round, and reactive to light.  Neck:     Thyroid: No thyromegaly.     Vascular: No JVD.     Trachea: No tracheal deviation.  Cardiovascular:     Rate and Rhythm: Normal rate and regular rhythm.     Heart sounds: Normal heart sounds. No murmur heard.   No friction rub. No gallop.  Pulmonary:     Effort: No respiratory distress.     Breath sounds: Normal breath sounds. No wheezing, rhonchi or rales.  Abdominal:     General: Bowel sounds are normal.     Palpations: Abdomen is soft. There is no mass.     Tenderness: There is no abdominal tenderness. There is no guarding or rebound.  Musculoskeletal:        General: No tenderness.  Normal range of motion.     Cervical back: Normal range of motion and neck supple.  Lymphadenopathy:     Cervical: No cervical adenopathy.  Skin:    General: Skin is warm.     Capillary Refill: Capillary refill takes less than 2 seconds.     Findings: No rash.  Neurological:     Mental Status: He is alert and oriented to person, place, and time.     Cranial Nerves: No cranial nerve deficit.     Deep Tendon Reflexes: Reflexes are normal and symmetric.    Wt Readings from Last 3 Encounters:  01/08/21 247 lb (112 kg)  09/04/20 258 lb (117 kg)  06/24/20 273 lb (123.8 kg)    BP 120/70   Pulse 64   Ht 6' (1.829 m)   Wt 247 lb (112 kg)   BMI 33.50 kg/m   Assessment and Plan:  1. Type 2 diabetes mellitus without complication, without long-term current use of insulin (HCC) Chronic.  Controlled.  Stable.  Patient will continue Victoza at current dosing.  We will check A1c. - HgB A1c  2. Chronic atrial fibrillation (HCC) History of chronic atrial fibrillation is followed by cardiology.  We will continue to monitor coag status with PT/INR. - INR/PT  3. Anticoagulated Patient on warfarin for anticoagulation and is tolerating well we will check INR for current status. - INR/PT

## 2021-01-09 ENCOUNTER — Ambulatory Visit (INDEPENDENT_AMBULATORY_CARE_PROVIDER_SITE_OTHER): Payer: Medicare Other | Admitting: Pharmacist

## 2021-01-09 DIAGNOSIS — N184 Chronic kidney disease, stage 4 (severe): Secondary | ICD-10-CM | POA: Diagnosis not present

## 2021-01-09 DIAGNOSIS — E119 Type 2 diabetes mellitus without complications: Secondary | ICD-10-CM

## 2021-01-09 DIAGNOSIS — E1122 Type 2 diabetes mellitus with diabetic chronic kidney disease: Secondary | ICD-10-CM

## 2021-01-09 LAB — PROTIME-INR
INR: 2.6 — ABNORMAL HIGH (ref 0.9–1.2)
Prothrombin Time: 25.8 s — ABNORMAL HIGH (ref 9.1–12.0)

## 2021-01-09 LAB — HEMOGLOBIN A1C
Est. average glucose Bld gHb Est-mCnc: 114 mg/dL
Hgb A1c MFr Bld: 5.6 % (ref 4.8–5.6)

## 2021-01-09 NOTE — Progress Notes (Signed)
Chronic Care Management Pharmacy Note  01/09/2021 Name:  Mark Bauer MRN:  [id number] DOB:  Nov 06, 1955  Subjective: Mark Bauer is an 65 y.o. year old male who is a primary patient of Juline Patch, MD.  The CCM team was consulted for assistance with disease management and care coordination needs.    Engaged with patient by telephone for follow up visit in response to provider referral for pharmacy case management and/or care coordination services.   Consent to Services:  The patient was given information about Chronic Care Management services, agreed to services, and gave verbal consent prior to initiation of services.  Please see initial visit note for detailed documentation.   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, suspect 2/2 warfarin and htn 09/11/20- [place], ANP- (nephrology)-CKD IV polycystic ked[place] disease, planning in center HD bicarb 16 (goal 22-24) baking soda 1/2 tsp daily  Hospital visits: None in previous 6 months  Objective:  Lab Results  Component Value Date   CREATININE 6.46 (H) 02/16/2020   BUN 78 (H) 02/16/2020   GFRNONAA 8 (L) 02/16/2020   GFRAA 9 (L) 02/16/2020   NA 138 02/16/2020   K 4.7 02/23/2020   CALCIUM 9.6 02/16/2020   CO2 21 (L) 02/16/2020   GLUCOSE 109 (H) 02/16/2020    Lab Results  Component Value Date/Time   HGBA1C 5.6 01/08/2021 09:55 AM   HGBA1C 5.7 (H) 09/04/2020 09:28 AM    Last diabetic Eye exam:  Lab Results  Component Value Date/Time   HMDIABEYEEXA Retinopathy (A)  10/21/2020 12:00 AM    Last diabetic Foot exam: No results found for: HMDIABFOOTEX   Lab Results  Component Value Date   CHOL 100 05/02/2020   HDL 34 (L) 05/02/2020   LDLCALC 55 05/02/2020   TRIG 42 05/02/2020   CHOLHDL 3.7 12/16/2016    Hepatic Function Latest Ref Rng & Units 06/09/2019 01/09/2019 01/02/2019  Total Protein 6.0 - 8.5 g/dL 7.1 - 6.2  Albumin 3.8 - 4.8 g/dL 4.5 4.5 4.2  AST 0 - 40 IU/L '26 18 16  ' ALT 0 - 44 IU/L 34 18 17  Alk Phosphatase 39 - 117 IU/L 75 87 75  Total Bilirubin 0.0 - 1.2 mg/dL 0.5 0.6 0.5  Bilirubin, Direct 0.00 - 0.40 mg/dL 0.14 0.18 0.16    Lab Results  Component Value Date/Time   TSH 3.040 01/09/2019 12:06 PM    CBC Latest Ref Rng & Units 02/16/2020 01/09/2019  WBC 4.0 - 10.5 K/uL 6.2 7.1  Hemoglobin 13.0 - 17.0 g/dL 12.6(L) 13.1  Hematocrit 39.0 - 52.0 % 37.1(L) 38.2  Platelets 150 - 400 K/uL 157 172    No results found for: VD25OH  Clinical ASCVD: No  The ASCVD Risk score Mikey Bussing DC Jr., et al., 2013) failed to calculate for the following reasons:   The valid total cholesterol range is 130 to 320 mg/dL    Depression screen [facility] 2/9 01/08/2021 09/04/2020 06/24/2020  Decreased Interest 0 0 0  Down, Depressed, Hopeless 0 0 0  PHQ - 2 Score 0 0 0  Altered sleeping 0 0 0  Tired,  decreased energy 0 0 0  Change in appetite 0 0 0  Feeling bad or failure about yourself  0 0 0  Trouble concentrating 0 0 0  Moving slowly or fidgety/restless 0 0 0  Suicidal thoughts 0 0 0  PHQ-9 Score 0 0 0  Difficult doing work/chores - - -     CHA2DS2/VAS Stroke Risk Points  Current as of 3 hours ago     3 >= 2 Points: High Risk  1 - 1.99 Points: Medium Risk  0 Points: Low Risk    Last Change: N/A      Details    This score determines the patient's risk of having a stroke if the  patient has atrial fibrillation.       Points Metrics  0 Has Congestive Heart Failure:  No    Current as of 3 hours ago  0 Has Vascular Disease:  No    Current as of 3  hours ago  1 Has Hypertension:  Yes    Current as of 3 hours ago  1 Age:  89    Current as of 3 hours ago  1 Has Diabetes:  Yes    Current as of 3 hours ago  0 Had Stroke:  No  Had TIA:  No  Had Thromboembolism:  No    Current as of 3 hours ago  0 Male:  No    Current as of 3 hours ago            Social History   Tobacco Use  Smoking Status Never  Smokeless Tobacco Never   BP Readings from Last 3 Encounters:  01/08/21 120/70  09/04/20 120/80  06/24/20 130/62   Pulse Readings from Last 3 Encounters:  01/08/21 64  09/04/20 64  06/24/20 72   Wt Readings from Last 3 Encounters:  01/08/21 247 lb (112 kg)  09/04/20 258 lb (117 kg)  06/24/20 273 lb (123.8 kg)   BMI Readings from Last 3 Encounters:  01/08/21 33.50 kg/m  09/04/20 34.99 kg/m  06/24/20 37.03 kg/m    Assessment/Interventions: Review of patient past medical history, allergies, medications, health status, including review of consultants reports, laboratory and other test data, was performed as part of comprehensive evaluation and provision of chronic care management services.   SDOH:  (Social Determinants of Health) assessments and interventions performed: No  SDOH Screenings   Alcohol Screen: Low Risk    Last Alcohol Screening Score (AUDIT): 0  Depression (PHQ2-9): Low Risk    PHQ-2 Score: 0  Financial Resource Strain: Low Risk    Difficulty of Paying Living Expenses: Not hard at all  Food Insecurity: No Food Insecurity   Worried About Charity fundraiser in the Last Year: Never true   Ran Out of Food in the Last Year: Never true  Housing: Low Risk    Last Housing Risk Score: 0  Physical Activity: Inactive   Days of Exercise per Week: 0 days   Minutes of Exercise per Session: 0 min  Social Connections: Socially Isolated   Frequency of Communication with Friends and Family: More than three times a week   Frequency of Social Gatherings with Friends and Family: Never   Attends Religious  Services: Never   Marine scientist or Organizations: No   Attends Music therapist: Never   Marital Status: Never married  Stress: No Stress Concern Present   Feeling of Stress : Not at all  Tobacco Use: Low Risk  Smoking Tobacco Use: Never   Smokeless Tobacco Use: Never  Transportation Needs: No Transportation Needs   Lack of Transportation (Medical): No   Lack of Transportation (Non-Medical): No      Immunization History  Administered Date(s) Administered   Influenza, High Dose Seasonal PF 05/04/2019   Influenza,inj,Quad PF,6+ Mos 04/23/2017, 05/02/2018   Influenza-Unspecified 05/04/2019, 04/29/2020   PFIZER(Purple Top)SARS-COV-2 Vaccination 09/15/2019, 10/11/2019, 07/04/2020   Pneumococcal Conjugate-13 06/09/2019   Pneumococcal Polysaccharide-23 12/27/2015   Tdap 07/26/2017    Conditions to be addressed/monitored:  Hypertension, Hyperlipidemia, Diabetes, Atrial Fibrillation, GERD and Chronic Kidney Disease  Care Plan : [place]  Updates made by Vladimir Faster, [place] since 01/09/2021 12:00 AM     Problem: Afib, HTN, CKD IV, HLD, DM, GERD , gout   Priority: High     Goal: Disease Management   Start Date: 11/05/2020  This Visit's Progress: On track  Recent Progress: On track  Priority: High  Note:   Current Barriers:  Unable to independently monitor therapeutic efficacy Suboptimal therapeutic regimen for diabetes, afib  Pharmacist Clinical Goal(s):  Patient will achieve adherence to monitoring guidelines and medication adherence to achieve therapeutic efficacy maintain control of diabetes as evidenced by lab values  adhere to plan to optimize therapeutic regimen for a fib as evidenced by report of adherence to recommended medication management changes through collaboration with PharmD and provider.   Interventions: 1:1 collaboration with Juline Patch, MD regarding development and update of comprehensive plan of care as evidenced  by provider attestation and co-signature Inter-disciplinary care team collaboration (see longitudinal plan of care) Comprehensive medication review performed; medication list updated in electronic medical record  Creatinine 6.04 GFR ~10 - Has AV fistula and plans for in center HD - follows q2-3 months with nephrololgy Hypertension  CKD IV(BP goal <130/80) -Controlled -Current treatment: Spironolactone 25 mg Metoprolol succinate 100 mg qd Amlodipine 10 mg qd Torsemide 10  mg qd prn swelling (uses every other week) Furosemide 20 mg qd prn ( will start after he usees torsemide on hand ~ 1.5 months ) -Medications previously tried: losartan  -Current home readings: <130/80 -Current dietary habits: low sodium diet -Denies hypotensive/hypertensive symptoms -Educated on BP goals and benefits of medications for prevention of heart attack, stroke and kidney damage; Daily salt intake goal < 2300 mg; Exercise goal of 150 minutes per week; Symptoms of hypotension and importance of maintaining adequate hydration; -Counseled to monitor BP at home 5 times weekly, document, and provide log at future appointments -Counseled on diet and exercise extensively Recommended to continue current medication Recommended patient follow up with nephrology regarding losartan ( Discontinued in Feb vs. Take half of 175m tab at June visit) Patient has not taken since February.  Hyperlipidemia: (LDL goal < 70) -Controlled -Current treatment: Lovastatin 20 mg qd -Medications previously tried: na  --Educated on Benefits of statin for ASCVD risk reduction; Importance of limiting foods high in cholesterol; -Recommended to continue current medication  Diabetes (A1c goal <7%) -Controlled -Current medications: Victoza 1.8 mg daily -Medications previously tried: na  -Current home glucose readings fasting glucose: <130 post prandial glucose: <180 -Denies hypoglycemic/hyperglycemic symptoms --Educated on A1c and  blood sugar goals; Benefits of weight loss; Prevention and management of hypoglycemic episodes; Counseled to check feet daily and get yearly eye exams Counseled on availability of once weekly GLP-1 -Counseled to check feet daily and get yearly eye exams -Counseled on diet and exercise extensively Recommended to continue current medication  Atrial Fibrillation (Goal:  prevent stroke and major bleeding) -Controlled -CHADS2VASC: 3 -Current treatment: Rate control: Metoprolol succinate 100 mg qd Anticoagulation: Warfarin 4 mg except 5 mg TU/TH -Medications previously tried: na -Home BP and HR readings: <130/80, 60s -Counseled on importance of adherence to anticoagulant exactly as prescribed; bleeding risk associated with warfarin and importance of self-monitoring for signs/symptoms of bleeding; avoidance of NSAIDs due to increased bleeding risk with anticoagulants; seeking medical attention after a head injury or if there is blood in the urine/stool; -Recommended to continue current medication -Discussed changing to apixaban, patient prefers to stay on warfarin for now   Patient Goals/Self-Care Activities Patient will:  - take medications as prescribed check glucose daily, document, and provide at future appointments check blood pressure 3- 5 times weekly, document, and provide at future appointments  Follow Up Plan: Telephone follow up appointment with care management team member scheduled for: 6-8 weeks CPA, 3-4 months PharmD            Medication Assistance:  Patient exceeds income limits for Victoza patient assistance.  Patient's preferred pharmacy is:  [id number]Phone: [phone]: [phone] [facility] [place] [address]: [phone]:  [phone]  [id number] [address]: [phone]: [phone] pill box? Yes Pt endorses 99% compliance  We discussed: Current pharmacy is preferred with insurance plan and patient is satisfied with pharmacy services Patient decided to: Continue current medication management strategy  Care Plan and Follow Up Patient Decision:  Patient agrees to Care Plan and Follow-up.  Plan: Telephone follow up appointment with care management team member scheduled for:  2 months PharmD   JuJunita PushHaKenton KingfisherharmD, BCFort Polk South [phone]

## 2021-01-09 NOTE — Patient Instructions (Signed)
Visit Information  It was a pleasure speaking with you today. Thank you for letting me be part of your clinical team. Please call with any questions or concerns.    Goals Addressed             This Visit's Progress    Monitor and Manage My Blood Sugar-Diabetes Type 2   On track    Timeframe:  Long-Range Goal Priority:  High Start Date:                             Expected End Date:                       Follow Up Date 2 month follow up    - check blood sugar at prescribed times - check blood sugar if I feel it is too high or too low - take the blood sugar meter to all doctor visits    Why is this important?   Checking your blood sugar at home helps to keep it from getting very high or very low.  Writing the results in a diary or log helps the doctor know how to care for you.  Your blood sugar log should have the time, date and the results.  Also, write down the amount of insulin or other medicine that you take.  Other information, like what you ate, exercise done and how you were feeling, will also be helpful.     Notes:       Track and Manage My Blood Pressure-Hypertension   On track    Timeframe:  Long-Range Goal Priority:  High Start Date:                             Expected End Date:                       Follow Up Date 2 month follow up   - check blood pressure 3 times per week - write blood pressure results in a log or diary    Why is this important?   You won't feel high blood pressure, but it can still hurt your blood vessels.  High blood pressure can cause heart or kidney problems. It can also cause a stroke.  Making lifestyle changes like losing a little weight or eating less salt will help.  Checking your blood pressure at home and at different times of the day can help to control blood pressure.  If the doctor prescribes medicine remember to take it the way the doctor ordered.  Call the office if you cannot afford the medicine or if there are questions  about it.     Notes:          Patient verbalizes understanding of instructions provided today and agrees to view in [place].   Telephone follow up appointment with pharmacy team member scheduled for: 6- 8 weeks CPA, 3-4 months PharmD  Junita Push. Kenton Kingfisher PharmD, [place] Clinical Pharmacist [phone]

## 2021-01-13 ENCOUNTER — Ambulatory Visit (INDEPENDENT_AMBULATORY_CARE_PROVIDER_SITE_OTHER): Payer: Medicare Other

## 2021-01-13 DIAGNOSIS — Z0000 Encounter for general adult medical examination without abnormal findings: Secondary | ICD-10-CM

## 2021-01-13 NOTE — Patient Instructions (Signed)
Mr. Mark Bauer , Thank you for taking time to come for your Medicare Wellness Visit. I appreciate your ongoing commitment to your health goals. Please review the following plan we discussed and let me know if I can assist you in the future.   Screening recommendations/referrals: Colonoscopy: done 2012. Due for repeat screening.  Recommended yearly ophthalmology/optometry visit for glaucoma screening and checkup Recommended yearly dental visit for hygiene and checkup  Vaccinations: Influenza vaccine: done 04/29/20 Pneumococcal vaccine: done 06/09/19 Tdap vaccine: done 07/26/17 Shingles vaccine: Shingrix discussed. Please contact your pharmacy for coverage information.  Covid-19:  done 09/15/19, 10/11/19 & 07/04/20  Advanced directives: Advance directive discussed with you today. Even though you declined this today please call our office should you change your mind and we can give you the proper paperwork for you to fill out.   Conditions/risks identified: Keep up the great work!  Next appointment: Follow up in one year for your annual wellness visit.   Preventive Care 12 Years and Older, Male Preventive care refers to lifestyle choices and visits with your health care provider that can promote health and wellness. What does preventive care include? A yearly physical exam. This is also called an annual well check. Dental exams once or twice a year. Routine eye exams. Ask your health care provider how often you should have your eyes checked. Personal lifestyle choices, including: Daily care of your teeth and gums. Regular physical activity. Eating a healthy diet. Avoiding tobacco and drug use. Limiting alcohol use. Practicing safe sex. Taking low doses of aspirin every day. Taking vitamin and mineral supplements as recommended by your health care provider. What happens during an annual well check? The services and screenings done by your health care provider during your annual well check  will depend on your age, overall health, lifestyle risk factors, and family history of disease. Counseling  Your health care provider may ask you questions about your: Alcohol use. Tobacco use. Drug use. Emotional well-being. Home and relationship well-being. Sexual activity. Eating habits. History of falls. Memory and ability to understand (cognition). Work and work Statistician. Screening  You may have the following tests or measurements: Height, weight, and BMI. Blood pressure. Lipid and cholesterol levels. These may be checked every 5 years, or more frequently if you are over 76 years old. Skin check. Lung cancer screening. You may have this screening every year starting at age 55 if you have a 30-pack-year history of smoking and currently smoke or have quit within the past 15 years. Fecal occult blood test (FOBT) of the stool. You may have this test every year starting at age 83. Flexible sigmoidoscopy or colonoscopy. You may have a sigmoidoscopy every 5 years or a colonoscopy every 10 years starting at age 80. Prostate cancer screening. Recommendations will vary depending on your family history and other risks. Hepatitis C blood test. Hepatitis B blood test. Sexually transmitted disease (STD) testing. Diabetes screening. This is done by checking your blood sugar (glucose) after you have not eaten for a while (fasting). You may have this done every 1-3 years. Abdominal aortic aneurysm (AAA) screening. You may need this if you are a current or former smoker. Osteoporosis. You may be screened starting at age 84 if you are at high risk. Talk with your health care provider about your test results, treatment options, and if necessary, the need for more tests. Vaccines  Your health care provider may recommend certain vaccines, such as: Influenza vaccine. This is recommended every year. Tetanus, diphtheria,  and acellular pertussis (Tdap, Td) vaccine. You may need a Td booster every 10  years. Zoster vaccine. You may need this after age 46. Pneumococcal 13-valent conjugate (PCV13) vaccine. One dose is recommended after age 28. Pneumococcal polysaccharide (PPSV23) vaccine. One dose is recommended after age 55. Talk to your health care provider about which screenings and vaccines you need and how often you need them. This information is not intended to replace advice given to you by your health care provider. Make sure you discuss any questions you have with your health care provider. Document Released: 08/02/2015 Document Revised: 03/25/2016 Document Reviewed: 05/07/2015 Elsevier Interactive Patient Education  2017 [employer] Prevention in the Home Falls can cause injuries. They can happen to people of all ages. There are many things you can do to make your home safe and to help prevent falls. What can I do on the outside of my home? Regularly fix the edges of walkways and driveways and fix any cracks. Remove anything that might make you trip as you walk through a door, such as a raised step or threshold. Trim any bushes or trees on the path to your home. Use bright outdoor lighting. Clear any walking paths of anything that might make someone trip, such as rocks or tools. Regularly check to see if handrails are loose or broken. Make sure that both sides of any steps have handrails. Any raised decks and porches should have guardrails on the edges. Have any leaves, snow, or ice cleared regularly. Use sand or salt on walking paths during winter. Clean up any spills in your garage right away. This includes oil or grease spills. What can I do in the bathroom? Use night lights. Install grab bars by the toilet and in the tub and shower. Do not use towel bars as grab bars. Use non-skid mats or decals in the tub or shower. If you need to sit down in the shower, use a plastic, non-slip stool. Keep the floor dry. Clean up any water that spills on the floor as soon as it  happens. Remove soap buildup in the tub or shower regularly. Attach bath mats securely with double-sided non-slip rug tape. Do not have throw rugs and other things on the floor that can make you trip. What can I do in the bedroom? Use night lights. Make sure that you have a light by your bed that is easy to reach. Do not use any sheets or blankets that are too big for your bed. They should not hang down onto the floor. Have a firm chair that has side arms. You can use this for support while you get dressed. Do not have throw rugs and other things on the floor that can make you trip. What can I do in the kitchen? Clean up any spills right away. Avoid walking on wet floors. Keep items that you use a lot in easy-to-reach places. If you need to reach something above you, use a strong step stool that has a grab bar. Keep electrical cords out of the way. Do not use floor polish or wax that makes floors slippery. If you must use wax, use non-skid floor wax. Do not have throw rugs and other things on the floor that can make you trip. What can I do with my stairs? Do not leave any items on the stairs. Make sure that there are handrails on both sides of the stairs and use them. Fix handrails that are broken or loose. Make sure  that handrails are as long as the stairways. Check any carpeting to make sure that it is firmly attached to the stairs. Fix any carpet that is loose or worn. Avoid having throw rugs at the top or bottom of the stairs. If you do have throw rugs, attach them to the floor with carpet tape. Make sure that you have a light switch at the top of the stairs and the bottom of the stairs. If you do not have them, ask someone to add them for you. What else can I do to help prevent falls? Wear shoes that: Do not have high heels. Have rubber bottoms. Are comfortable and fit you well. Are closed at the toe. Do not wear sandals. If you use a stepladder: Make sure that it is fully opened.  Do not climb a closed stepladder. Make sure that both sides of the stepladder are locked into place. Ask someone to hold it for you, if possible. Clearly mark and make sure that you can see: Any grab bars or handrails. First and last steps. Where the edge of each step is. Use tools that help you move around (mobility aids) if they are needed. These include: Canes. Walkers. Scooters. Crutches. Turn on the lights when you go into a dark area. Replace any light bulbs as soon as they burn out. Set up your furniture so you have a clear path. Avoid moving your furniture around. If any of your floors are uneven, fix them. If there are any pets around you, be aware of where they are. Review your medicines with your doctor. Some medicines can make you feel dizzy. This can increase your chance of falling. Ask your doctor what other things that you can do to help prevent falls. This information is not intended to replace advice given to you by your health care provider. Make sure you discuss any questions you have with your health care provider. Document Released: 05/02/2009 Document Revised: 12/12/2015 Document Reviewed: 08/10/2014 Elsevier Interactive Patient Education  2017 [employer].

## 2021-01-13 NOTE — Progress Notes (Signed)
Subjective:   Mark Bauer is a 65 y.o. male who presents for Medicare Annual/Subsequent preventive examination.  Virtual Visit via Telephone Note  I connected with  Mark Bauer on 01/13/21 at  2:00 PM EDT by telephone and verified that I am speaking with the correct person using two identifiers.  Location: Patient: home Provider: [facility] Persons participating in the virtual visit: [place]   I discussed the limitations, risks, security and privacy concerns of performing an evaluation and management service by telephone and the availability of in person appointments. The patient expressed understanding and agreed to proceed.  Interactive audio and video telecommunications were attempted between this nurse and patient, however failed, due to patient having technical difficulties OR patient did not have access to video capability.  We continued and completed visit with audio only.  Some vital signs may be absent or patient reported.   [name], LPN   Review of Systems     Cardiac Risk Factors include: advanced age (>49mn, >>85women);diabetes mellitus;dyslipidemia;hypertension;obesity (BMI >30kg/m2)     Objective:    There were no vitals filed for this visit. There is no height or weight on file to calculate BMI.  Advanced Directives 01/13/2021 02/16/2020 01/10/2020 07/18/2015  Does Patient Have a Medical Advance Directive? No No No No  Would patient like information on creating a medical advance directive? No - Patient declined No - Patient declined Yes (MAU/Ambulatory/Procedural Areas - Information given) -    Current Medications (verified) Outpatient Encounter Medications as of 01/13/2021  Medication Sig   allopurinol (ZYLOPRIM) 100 MG tablet TAKE 1 TABLET DAILY   amLODipine (NORVASC) 10 MG tablet Take 1 tablet (10 mg total) by mouth daily.   b complex vitamins tablet Take 1 tablet by mouth daily.   blood glucose meter kit and supplies Dispense  based on patient and insurance preference. Use up to four times daily as directed. (FOR ICD-10 E11.9).   Cholecalciferol 50 MCG (2000 UT) CAPS Take 2,000 Units by mouth daily.    glucose blood (ONETOUCH ULTRA) test strip One test daily   Lancets (ONETOUCH DELICA PLUS [id number] MISC USE AS DIRECTED EVERY DAY   liraglutide (VICTOZA) 18 MG/3ML SOPN INJECT SUBCUTANEOUSLY 1.8MG ONCE A DAY   loratadine (CLARITIN) 10 MG tablet Take 10 mg by mouth at bedtime. otc   lovastatin (MEVACOR) 20 MG tablet Take 1 tablet (20 mg total) by mouth daily.   metoprolol succinate (TOPROL-XL) 100 MG 24 hr tablet Take 1 tablet (100 mg total) by mouth daily. Take with or immediately following a meal.   NOVOFINE PEN NEEDLE 32G X 6 MM MISC USE AND DISCARD 1 PEN      NEEDLE SUBCUTANEOUSLY DAILYAS DIRECTED   pantoprazole (PROTONIX) 40 MG tablet TAKE 1 TABLET DAILY   spironolactone (ALDACTONE) 25 MG tablet Take 25 mg by mouth daily.   torsemide (DEMADEX) 10 MG tablet Take 10 mg by mouth daily as needed.   warfarin (COUMADIN) 2 MG tablet Take 2 tablets (4 mg total) by mouth daily.   warfarin (JANTOVEN) 5 MG tablet Take 1 tablet (5 mg total) by mouth daily.   furosemide (LASIX) 20 MG tablet Take 20 mg by mouth daily. (Patient not taking: Reported on 01/13/2021)   [DISCONTINUED] DHA-EPA-VIT B6-B12-FOLIC ACID PO Take 17,741Units by mouth daily.   No facility-administered encounter medications on file as of 01/13/2021.    Allergies (verified) Ciprofloxacin, Pollen extract, and Caffeine   History: Past Medical History:  Diagnosis Date  Allergy    Bilateral undescended testicles    Cancer ([place])    melanoma on forehead   Cataract    OU   Diabetes mellitus without complication ([place])    Dysrhythmia    a fib   GERD (gastroesophageal reflux disease)    Gout    Hepatitis    polycystic    Hypertension    Hypokalemia    Mixed hyperlipidemia    Polycystic kidney    Vitamin D deficiency    Past Surgical History:   Procedure Laterality Date   BASCILIC VEIN TRANSPOSITION Left 02/23/2020   Procedure: BASCILIC VEIN TRANSPOSITION (SINGLE STAGE);  Surgeon: Katha Cabal, MD;  Location: [facility] ORS;  Service: Vascular;  Laterality: Left;   COLONOSCOPY  2012   hemorrhoids   melanoma removal     forehead   Family History  Problem Relation Age of Onset   Diabetes Mother    Heart disease Father    Social History   Socioeconomic History   Marital status: Single    Spouse name: Not on file   Number of children: 0   Years of education: Not on file   Highest education level: Associate degree: occupational, Hotel manager, or vocational program  Occupational History   Occupation: retired  Tobacco Use   Smoking status: Never   Smokeless tobacco: Never  Vaping Use   Vaping Use: Never used  Substance and Sexual Activity   Alcohol use: No   Drug use: No   Sexual activity: Never  Other Topics Concern   Not on file  Social History Narrative   Never married, no children, lives alone   Social Determinants of Health   Financial Resource Strain: Low Risk    Difficulty of Paying Living Expenses: Not hard at all  Food Insecurity: No Food Insecurity   Worried About Charity fundraiser in the Last Year: Never true   Arboriculturist in the Last Year: Never true  Transportation Needs: No Transportation Needs   Lack of Transportation (Medical): No   Lack of Transportation (Non-Medical): No  Physical Activity: Sufficiently Active   Days of Exercise per Week: 6 days   Minutes of Exercise per Session: 30 min  Stress: No Stress Concern Present   Feeling of Stress : Not at all  Social Connections: Socially Isolated   Frequency of Communication with Friends and Family: More than three times a week   Frequency of Social Gatherings with Friends and Family: Once a week   Attends Religious Services: Never   Marine scientist or Organizations: No   Attends Music therapist: Never   Marital Status:  Never married    Tobacco Counseling Counseling given: Not Answered   Clinical Intake:  Pre-visit preparation completed: Yes  Pain : No/denies pain     Nutritional Risks: None Diabetes: Yes CBG done?: No Did pt. bring in CBG monitor from home?: No  How often do you need to have someone help you when you read instructions, pamphlets, or other written materials from your doctor or pharmacy?: 1 - Never  Nutrition Risk Assessment:  Has the patient had any N/V/D within the last 2 months?  No  Does the patient have any non-healing wounds?  No  Has the patient had any unintentional weight loss or weight gain?  No   Diabetes:  Is the patient diabetic?  Yes  If diabetic, was a CBG obtained today?  No  Did the patient bring in their glucometer  from home?  No  How often do you monitor your CBG's? 3 times weekly per patient.   Financial Strains and Diabetes Management:  Are you having any financial strains with the device, your supplies or your medication? No .  Does the patient want to be seen by Chronic Care Management for management of their diabetes?  No  Would the patient like to be referred to a Nutritionist or for Diabetic Management?  No   Diabetic Exams:  Diabetic Eye Exam: Completed 10/21/20 positive retinopathy.   Diabetic Foot Exam: Completed 05/02/20.   Interpreter Needed?: No  Information entered by :: [name] LPN   Activities of Daily Living In your present state of health, do you have any difficulty performing the following activities: 01/13/2021 02/16/2020  Hearing? N N  Comment declines hearing aids -  Vision? N N  Difficulty concentrating or making decisions? N N  Walking or climbing stairs? N N  Dressing or bathing? N N  Doing errands, shopping? N N  Preparing Food and eating ? N -  Using the Toilet? N -  In the past six months, have you accidently leaked urine? N -  Do you have problems with loss of bowel control? N -  Managing your  Medications? N -  Managing your Finances? N -  Housekeeping or managing your Housekeeping? N -  Some recent data might be hidden    Patient Care Team: Juline Patch, MD as PCP - General (Family Medicine) Vladimir Faster, [facility] (Pharmacist) Delana Meyer, Dolores Lory, MD (Vascular Surgery)  Indicate any recent Medical Services you may have received from other than [facility] providers in the past year (date may be approximate).     Assessment:   This is a routine wellness examination for Trystin.  Hearing/Vision screen Hearing Screening - Comments:: Pt denies hearing difficulty Vision Screening - Comments:: Annual vision screenings done at [facility] issues and exercise activities discussed: Current Exercise Habits: Home exercise routine, Type of exercise: walking, Time (Minutes): 30, Frequency (Times/Week): 6, Weekly Exercise (Minutes/Week): 180, Intensity: Mild, Exercise limited by: None identified   Goals Addressed   None    Depression Screen PHQ 2/9 Scores 01/13/2021 01/08/2021 09/04/2020 06/24/2020 05/02/2020 01/10/2020 01/09/2019  PHQ - 2 Score 0 0 0 0 0 0 0  PHQ- 9 Score - 0 0 0 0 - -    Fall Risk Fall Risk  01/13/2021 06/24/2020 05/02/2020 01/10/2020 01/09/2019  Falls in the past year? 0 0 0 0 0  Number falls in past yr: 0 - - 0 0  Injury with Fall? 0 - - 0 0  Risk for fall due to : No Fall Risks - - No Fall Risks -  Follow up Falls prevention discussed Falls evaluation completed Falls evaluation completed Falls prevention discussed Falls evaluation completed    [place]:  Any stairs in or around the home? Yes  If so, are there any without handrails? No  Home free of loose throw rugs in walkways, pet beds, electrical cords, etc? Yes  Adequate lighting in your home to reduce risk of falls? Yes   ASSISTIVE DEVICES UTILIZED TO PREVENT FALLS:  Life alert? No  Use of a cane, walker or w/c? No  Grab bars in the  bathroom? Yes  Shower chair or bench in shower? Yes  Elevated toilet seat or a handicapped toilet? No   TIMED UP AND GO:  Was the test performed?  No . Telephonic visit.   Cognitive Function: Normal cognitive status assessed by direct observation by this Nurse Health Advisor. No abnormalities found.          Immunizations Immunization History  Administered Date(s) Administered   Influenza, High Dose Seasonal PF 05/04/2019   Influenza,inj,Quad PF,6+ Mos 04/23/2017, 05/02/2018   Influenza-Unspecified 05/04/2019, 04/29/2020   PFIZER(Purple Top)SARS-COV-2 Vaccination 09/15/2019, 10/11/2019, 07/04/2020   Pneumococcal Conjugate-13 06/09/2019   Pneumococcal Polysaccharide-23 12/27/2015   Tdap 07/26/2017    TDAP status: Up to date  Flu Vaccine status: Up to date  Pneumococcal vaccine status: Up to date  Covid-19 vaccine status: Completed vaccines  Qualifies for Shingles Vaccine? Yes   Zostavax completed No   Shingrix Completed?: No.    Education has been provided regarding the importance of this vaccine. Patient has been advised to call insurance company to determine out of pocket expense if they have not yet received this vaccine. Advised may also receive vaccine at local pharmacy or Health Dept. Verbalized acceptance and understanding.  Screening Tests Health Maintenance  Topic Date Due   PNA vac Low Risk Adult (2 of 2 - PPSV23) 12/26/2020   COVID-19 Vaccine (4 - Booster for Pfizer series) 01/24/2021 (Originally 11/02/2020)   Zoster Vaccines- Shingrix (1 of 2) 04/10/2021 (Originally 12/18/2003)   INFLUENZA VACCINE  02/17/2021   FOOT EXAM  05/02/2021   HEMOGLOBIN A1C  07/10/2021   URINE MICROALBUMIN  09/04/2021   OPHTHALMOLOGY EXAM  10/21/2021   TETANUS/TDAP  07/27/2027   Hepatitis C Screening  Completed   HPV VACCINES  Aged Out   COLONOSCOPY (Pts 45-52yr Insurance coverage will need to be confirmed)  Discontinued    Health Maintenance  Health Maintenance Due  Topic  Date Due   PNA vac Low Risk Adult (2 of 2 - PPSV23) 12/26/2020    Colorectal cancer screening: Type of screening: Colonoscopy. Completed 2012. Repeat every 10 years. Pt declines repeat screening colonoscopy  Lung Cancer Screening: (Low Dose CT Chest recommended if Age 67-80years, 30 pack-year currently smoking OR have quit w/in 15years.) does not qualify.   Additional Screening:  Hepatitis C Screening: does qualify; Completed 07/26/17  Vision Screening: Recommended annual ophthalmology exams for early detection of glaucoma and other disorders of the eye. Is the patient up to date with their annual eye exam?  Yes  Who is the provider or what is the name of the office in which the patient attends annual eye exams? Dr. ZCoralyn Pear   Dental Screening: Recommended annual dental exams for proper oral hygiene  Community Resource Referral / Chronic Care Management: CRR required this visit?  No   CCM required this visit?  No      Plan:     I have personally reviewed and noted the following in the patient's chart:   Medical and social history Use of alcohol, tobacco or illicit drugs  Current medications and supplements including opioid prescriptions. Patient is not currently taking opioid prescriptions. Functional ability and status Nutritional status Physical activity Advanced directives List of other physicians Hospitalizations, surgeries, and ER visits in previous 12 months Vitals Screenings to include cognitive, depression, and falls Referrals and appointments  In addition, I have reviewed and discussed with patient certain preventive protocols, quality metrics, and best practice recommendations. A written personalized care plan for preventive services as well as general preventive health recommendations were provided to patient.     KClemetine Marker LPN   [id number]  Nurse Notes: pt needs refill for pen needles sent  to [facility] mail order.

## 2021-01-15 ENCOUNTER — Other Ambulatory Visit: Payer: Self-pay

## 2021-01-15 DIAGNOSIS — E1121 Type 2 diabetes mellitus with diabetic nephropathy: Secondary | ICD-10-CM

## 2021-01-15 MED ORDER — NOVOFINE PEN NEEDLE 32G X 6 MM MISC: 1 refills | Status: DC

## 2021-01-28 NOTE — Progress Notes (Signed)
[facility] Note  01/29/2021     CHIEF COMPLAINT Patient presents for Retina Follow Up   HISTORY OF PRESENT ILLNESS: Mark Bauer is a 65 y.o. male who presents to the clinic today for:   HPI     Retina Follow Up   Patient presents with  Other.  In right eye.  This started weeks ago.  Severity is moderate.  Duration of weeks.  Since onset it is stable.  I, the attending physician,  performed the HPI with the patient and updated documentation appropriately.        Comments   Pt states vision is about the same OU.  Pt denies eye pain or discomfort and denies any new or worsening floaters or fol OU.      Last edited by Bernarda Caffey, MD on 02/01/2021 12:09 AM.    Pt    Referring physician: Agapito Games [address] [place], [place] [id number]  HISTORICAL INFORMATION:   Selected notes from the MEDICAL RECORD NUMBER Referred by Dr. Agapito Games for diabetic retinal eval LEE: 10/21/2020 BCVA OD: 20/25-2 OS: 20/25-2 Ocular Hx- ?DR PMH- DM, HTN, gout, last a1c was 5.7 in February 2022    CURRENT MEDICATIONS: No current outpatient medications on file. (Ophthalmic Drugs)   No current facility-administered medications for this visit. (Ophthalmic Drugs)   Current Outpatient Medications (Other)  Medication Sig   allopurinol (ZYLOPRIM) 100 MG tablet TAKE 1 TABLET DAILY   amLODipine (NORVASC) 10 MG tablet Take 1 tablet (10 mg total) by mouth daily.   b complex vitamins tablet Take 1 tablet by mouth daily.   blood glucose meter kit and supplies Dispense based on patient and insurance preference. Use up to four times daily as directed. (FOR ICD-10 E11.9).   Cholecalciferol 50 MCG (2000 UT) CAPS Take 2,000 Units by mouth daily.    furosemide (LASIX) 20 MG tablet Take 20 mg by mouth daily. (Patient not taking: Reported on 01/13/2021)   glucose blood (ONETOUCH ULTRA) test strip One test daily   Insulin Pen Needle (NOVOFINE PEN NEEDLE)  32G X 6 MM MISC USE AND DISCARD 1 PEN      NEEDLE SUBCUTANEOUSLY DAILYAS DIRECTED   Lancets (ONETOUCH DELICA PLUS [id number]) MISC USE AS DIRECTED EVERY DAY   liraglutide (VICTOZA) 18 MG/3ML SOPN INJECT SUBCUTANEOUSLY 1.8MG ONCE A DAY   loratadine (CLARITIN) 10 MG tablet Take 10 mg by mouth at bedtime. otc   lovastatin (MEVACOR) 20 MG tablet Take 1 tablet (20 mg total) by mouth daily.   metoprolol succinate (TOPROL-XL) 100 MG 24 hr tablet Take 1 tablet (100 mg total) by mouth daily. Take with or immediately following a meal.   pantoprazole (PROTONIX) 40 MG tablet TAKE 1 TABLET DAILY   spironolactone (ALDACTONE) 25 MG tablet Take 25 mg by mouth daily.   torsemide (DEMADEX) 10 MG tablet Take 10 mg by mouth daily as needed.   warfarin (COUMADIN) 2 MG tablet Take 2 tablets (4 mg total) by mouth daily.   warfarin (JANTOVEN) 5 MG tablet Take 1 tablet (5 mg total) by mouth daily.   No current facility-administered medications for this visit. (Other)      REVIEW OF SYSTEMS:     ALLERGIES Allergies  Allergen Reactions   Ciprofloxacin Other (See Comments)    hallucination   Pollen Extract Other (See Comments)    Sneezing, watery eyes, etc.   Caffeine Palpitations    palpatations    PAST MEDICAL HISTORY  Past Medical History:  Diagnosis Date   Allergy    Bilateral undescended testicles    Cancer ([place])    melanoma on forehead   Cataract    OU   Diabetes mellitus without complication ([place])    Dysrhythmia    a fib   GERD (gastroesophageal reflux disease)    Gout    Hepatitis    polycystic    Hypertension    Hypokalemia    Mixed hyperlipidemia    Polycystic kidney    Vitamin D deficiency    Past Surgical History:  Procedure Laterality Date   BASCILIC VEIN TRANSPOSITION Left 02/23/2020   Procedure: BASCILIC VEIN TRANSPOSITION (SINGLE STAGE);  Surgeon: Katha Cabal, MD;  Location: [facility] ORS;  Service: Vascular;  Laterality: Left;   COLONOSCOPY  2012   hemorrhoids    melanoma removal     forehead    FAMILY HISTORY Family History  Problem Relation Age of Onset   Diabetes Mother    Heart disease Father     SOCIAL HISTORY Social History   Tobacco Use   Smoking status: Never   Smokeless tobacco: Never  Vaping Use   Vaping Use: Never used  Substance Use Topics   Alcohol use: No   Drug use: No         OPHTHALMIC EXAM:  Base Eye Exam     Visual Acuity (Snellen - Linear)       Right Left   Dist cc 20/20 -2 20/20 -2         Tonometry (Applanation, 1:07 PM)       Right Left   Pressure 15 14         Pupils       Dark Light Shape React APD   Right 3 2 Round Brisk 0   Left 3 2 Round Brisk 0         Visual Fields       Left Right    Full Full         Extraocular Movement       Right Left    Full Full         Neuro/Psych     Oriented x3: Yes   Mood/Affect: Normal         Dilation     Both eyes: 1.0% Mydriacyl, 2.5% Phenylephrine @ 1:07 PM           Slit Lamp and Fundus Exam     Slit Lamp Exam       Right Left   Lids/Lashes Dermatochalasis - upper lid, Meibomian gland dysfunction Dermatochalasis - upper lid, Meibomian gland dysfunction   Conjunctiva/Sclera White and quiet White and quiet   Cornea mild tear film debris, trace PEE mild tear film debris, trace PEE   Anterior Chamber Deep and quiet Deep and quiet   Iris Round and dilated Round and dilated   Lens 2+ Nuclear sclerosis, 2+ Cortical cataract 2+ Nuclear sclerosis, 2-3+ Cortical cataract   Vitreous Vitreous syneresis, Posterior vitreous detachment Vitreous syneresis         Fundus Exam       Right Left   Disc mild Pallor, Sharp rim, Compact mild Pallor, Sharp rim   C/D Ratio 0.2 0.4   Macula Flat, Blunted foveal reflex, RPE mottling and clumping, faint subretinal hemorrhage along proximal IT arteriole -- resolved Flat, Blunted foveal reflex, mild RPE mottling and clumping, No heme or edema   Vessels attenuated, Tortuous, AV  crossing changes attenuated,  Tortuous, AV crossing changes   Periphery Attached, blonde fundus, prominent vortex veins, pigmented cystoid degeneration inferiorly Attached, shallow peripheral schisis, pigmented cystoid degeneration inferiorly , No RT/RD           Refraction     Wearing Rx       Sphere Cylinder Axis Add   Right -6.50 +1.50 058 +1.75   Left -3.75 +1.50 116 +1.75    Type: PAL            IMAGING AND PROCEDURES  Imaging and Procedures for 01/29/2021  OCT, Retina - OU - Both Eyes       Right Eye Quality was good. Central Foveal Thickness: 236. Progression has improved. Findings include normal foveal contour, no IRF, no SRF (Foveal notch, focal ellipsoid irregularity and inner retinal hyper reflectivity along IT arcades -- improved).   Left Eye Quality was good. Central Foveal Thickness: 246. Progression has been stable. Findings include normal foveal contour, no IRF, no SRF, vitreomacular adhesion (Mild multilaminar schisis 360 periphery, sparing macula).   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 CME - cystoid macular edema. PED - pigment epithelial detachment. IRF - intraretinal fluid. SRF - subretinal fluid. EZ - ellipsoid zone. ERM - epiretinal membrane. ORA - outer retinal atrophy. ORT - outer retinal tubulation. SRHM - subretinal hyper-reflective material. IRHM - intraretinal hyper-reflective material               ASSESSMENT/PLAN:    ICD-10-CM   1. Subretinal hemorrhage of right eye  H35.61     2. Retinal edema  H35.81 OCT, Retina - OU - Both Eyes    3. 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 Essential hypertension  I10     5.  Hypertensive retinopathy of both eyes  H35.033     6. Combined forms of age-related cataract of both eyes  H25.813       1,2. Subretinal hemorrhage OD -- resolving  - incidental finding on routine eye exam by Dr. Marvel Plan  - pt asymptomatic  - focal subretinal heme along proximal IT arteriole OD -- now barely visible  - OCT shows mild inner retinal hyperreflectivity and focal ellipsoid irregularity at focal heme site -- improved  - FA 4.13.22 without leakage, but +blockage at heme site -- not active  - pt with history of Afib, on coumadin -- history of difficulty controlling INR and easy bruising  - suspect focal subretinal heme is related to history of coumadin use and HTN  - not visually significant and not threatening vision  - discussed findings, prognosis  - no intervention recommended at this time  - monitor  - f/u 1 year, sooner prn -- DFE/OCT  3. Retinoschisis OS -- stable  - peripheral multilaminar schisis, sparing macula -- identified on widefield OCT 4.13.22  - OCT shows shallow, multilaminar schisis in periphery only -- stable from prior  - no RT/RD on exam  - pt asymptomatic  - s/s of RD reviewed  - f/u 1 year, DFE, OCT  4,5. Hypertensive retinopathy OU - discussed importance of tight BP control - monitor  6. Mixed Cataract OU - The symptoms of cataract, surgical options, and treatments and risks were discussed with  patient. - discussed diagnosis and progression - monitor for now  Ophthalmic Meds Ordered this visit:  No orders of the defined types were placed in this encounter.     Return in about 1 year (around 01/29/2022) for f/u schisis OS, DFE, OCT.  There are no Patient Instructions on file for this visit.   Explained the diagnoses, plan, and follow up with the patient and they expressed understanding.  Patient expressed understanding of the importance of proper follow up care.   This document serves as a record of services personally performed by  Gardiner Sleeper, MD, PhD. It was created on their behalf by Roselee Nova, COMT. The creation of this record is the provider's dictation and/or activities during the visit.  Electronically signed by: Roselee Nova, COMT 02/01/21 12:13 AM  This document serves as a record of services personally performed by Gardiner Sleeper, MD, PhD. It was created on their behalf by San Jetty. Owens Shark, OA an ophthalmic technician. The creation of this record is the provider's dictation and/or activities during the visit.    Electronically signed by: San Jetty. Marguerita Merles 07.13.2022 12:13 AM   Gardiner Sleeper, M.D., Ph.D. Diseases & Surgery of the Retina and Vitreous [facility]  I have reviewed the above documentation for accuracy and completeness, and I agree with the above. Gardiner Sleeper, M.D., Ph.D. 02/01/21 12:13 AM   Abbreviations: M myopia (nearsighted); A astigmatism; H hyperopia (farsighted); P presbyopia; Mrx spectacle prescription;  CTL contact lenses; OD right eye; OS left eye; OU both eyes  XT exotropia; ET esotropia; PEK punctate epithelial keratitis; PEE punctate epithelial erosions; DES dry eye syndrome; MGD meibomian gland dysfunction; ATs artificial tears; PFAT's preservative free artificial tears; [place] nuclear sclerotic cataract; PSC posterior subcapsular cataract; ERM epi-retinal membrane; PVD posterior vitreous detachment; RD retinal detachment; DM diabetes mellitus; DR diabetic retinopathy; NPDR non-proliferative diabetic retinopathy; PDR proliferative diabetic retinopathy; CSME clinically significant macular edema; DME diabetic macular edema; dbh dot blot hemorrhages; CWS cotton wool spot; POAG primary open angle glaucoma; C/D cup-to-disc ratio; HVF humphrey visual field; GVF goldmann visual field; OCT optical coherence tomography; IOP intraocular pressure; BRVO Branch retinal vein occlusion; CRVO central retinal vein occlusion; CRAO central retinal artery occlusion; BRAO branch  retinal artery occlusion; RT retinal tear; SB scleral buckle; PPV pars plana vitrectomy; VH Vitreous hemorrhage; PRP panretinal laser photocoagulation; IVK intravitreal kenalog; VMT vitreomacular traction; MH Macular hole;  NVD neovascularization of the disc; NVE neovascularization elsewhere; AREDS age related eye disease study; ARMD age related macular degeneration; POAG primary open angle glaucoma; EBMD epithelial/anterior basement membrane dystrophy; ACIOL anterior chamber intraocular lens; IOL intraocular lens; PCIOL posterior chamber intraocular lens; Phaco/IOL phacoemulsification with intraocular lens placement; [place] photorefractive keratectomy; LASIK laser assisted in situ keratomileusis; HTN hypertension; DM diabetes mellitus; COPD chronic obstructive pulmonary disease

## 2021-01-29 ENCOUNTER — Other Ambulatory Visit: Payer: Self-pay

## 2021-01-29 ENCOUNTER — Ambulatory Visit (INDEPENDENT_AMBULATORY_CARE_PROVIDER_SITE_OTHER): Payer: Medicare Other | Admitting: Ophthalmology

## 2021-01-29 DIAGNOSIS — I10 Essential (primary) hypertension: Secondary | ICD-10-CM

## 2021-01-29 DIAGNOSIS — H35033 Hypertensive retinopathy, bilateral: Secondary | ICD-10-CM

## 2021-01-29 DIAGNOSIS — H25813 Combined forms of age-related cataract, bilateral: Secondary | ICD-10-CM | POA: Diagnosis not present

## 2021-01-29 DIAGNOSIS — H33102 Unspecified retinoschisis, left eye: Secondary | ICD-10-CM | POA: Diagnosis not present

## 2021-01-29 DIAGNOSIS — H3561 Retinal hemorrhage, right eye: Secondary | ICD-10-CM

## 2021-01-29 DIAGNOSIS — H3581 Retinal edema: Secondary | ICD-10-CM | POA: Diagnosis not present

## 2021-02-01 ENCOUNTER — Encounter (INDEPENDENT_AMBULATORY_CARE_PROVIDER_SITE_OTHER): Payer: Self-pay | Admitting: Ophthalmology

## 2021-02-06 ENCOUNTER — Telehealth: Payer: Self-pay

## 2021-02-06 ENCOUNTER — Other Ambulatory Visit: Payer: Self-pay

## 2021-02-06 DIAGNOSIS — Z7901 Long term (current) use of anticoagulants: Secondary | ICD-10-CM | POA: Diagnosis not present

## 2021-02-06 NOTE — Telephone Encounter (Unsigned)
Copied from [place] [phone]. Topic: General - Other >> Feb 06, 2021  9:04 AM Lennox Solders wrote: Reason for CRM: Pt is calling and would like to pick up order to take to [facility] for PT/INR

## 2021-02-07 LAB — PROTIME-INR
INR: 3 — ABNORMAL HIGH (ref 0.9–1.2)
Prothrombin Time: 29.7 s — ABNORMAL HIGH (ref 9.1–12.0)

## 2021-02-24 ENCOUNTER — Ambulatory Visit: Payer: Medicare Other

## 2021-02-24 ENCOUNTER — Other Ambulatory Visit: Payer: Self-pay

## 2021-02-24 DIAGNOSIS — Z7901 Long term (current) use of anticoagulants: Secondary | ICD-10-CM | POA: Diagnosis not present

## 2021-02-25 LAB — PROTIME-INR
INR: 4.9 — ABNORMAL HIGH (ref 0.9–1.2)
Prothrombin Time: 47.1 s — ABNORMAL HIGH (ref 9.1–12.0)

## 2021-03-02 ENCOUNTER — Other Ambulatory Visit: Payer: Self-pay | Admitting: Family Medicine

## 2021-03-02 NOTE — Telephone Encounter (Signed)
    Please send ICD10 Code.   Name from pharmacy: [facility]       Will file in chart as: ONETOUCH ULTRA test strip    Possible duplicate: Hover to review recent actions on this medication   Sig: ONE TEST DAILY   Disp:  100 strip    Refills:  0   Start: 03/02/2021   Class: Normal   Non-formulary   Last ordered: Today by Juline Patch, MD Last refill: 03/02/2021   Rx #: [id number]   Pharmacy comment: Script Clarification:PLEASE SEND WITH ICD10 CODE FOR MED B BILLING, THANKS.   Endocrinology: Diabetes - Testing Supplies Passed 03/02/2021 03:02 PM  Protocol Details  Valid encounter within last 12 months    This request has changes from the previous prescription.  To be filled at: [id number] - [place], [place]

## 2021-03-02 NOTE — Telephone Encounter (Signed)
Requested Prescriptions  Pending Prescriptions Disp Refills  . ONETOUCH ULTRA test strip [employer] Med Name: ONE TOUCH ULTRA BLUE TEST STRP] 100 strip 0    Sig: ONE TEST DAILY     Endocrinology: Diabetes - Testing Supplies Passed - 03/02/2021 10:37 AM      Passed - Valid encounter within last 12 months    Recent Outpatient Visits          1 month ago Type 2 diabetes mellitus without complication, without long-term current use of insulin ([place])   [facility] Juline Patch, MD   5 months ago Type 2 diabetes mellitus with diabetic nephropathy, without long-term current use of insulin ([place])   [facility] Juline Patch, MD   8 months ago Ganglion cyst of finger of left hand   [facility] Juline Patch, MD   10 months ago Type 2 diabetes mellitus with diabetic nephropathy, without long-term current use of insulin ([place])   [facility] Juline Patch, MD   1 year ago Hyperuricemia   [facility] Juline Patch, MD      Future Appointments            In 2 months Juline Patch, MD [facility], [facility]

## 2021-03-11 ENCOUNTER — Telehealth: Payer: Self-pay

## 2021-03-11 NOTE — Progress Notes (Signed)
Chronic Care Management Pharmacy Assistant   Name: AASHRITH EVES  MRN: [id number] DOB: [birth year]   Reason for Encounter: Disease State Hypertension    Recent office visits:  None noted  Recent consult visits:  01/29/21 Bernarda Caffey - Ophthalmology - Follow up visit - No medication changes noted Follow up in one year    Hospital visits:  None in previous 6 months  Medications: Outpatient Encounter Medications as of 03/11/2021  Medication Sig Note   allopurinol (ZYLOPRIM) 100 MG tablet TAKE 1 TABLET DAILY    amLODipine (NORVASC) 10 MG tablet Take 1 tablet (10 mg total) by mouth daily.    b complex vitamins tablet Take 1 tablet by mouth daily. 01/13/2021: Includes folic acid    blood glucose meter kit and supplies Dispense based on patient and insurance preference. Use up to four times daily as directed. (FOR ICD-10 E11.9).    Cholecalciferol 50 MCG (2000 UT) CAPS Take 2,000 Units by mouth daily.     furosemide (LASIX) 20 MG tablet Take 20 mg by mouth daily. (Patient not taking: Reported on 01/13/2021) 01/13/2021: Pt takes PRN   Insulin Pen Needle (NOVOFINE PEN NEEDLE) 32G X 6 MM MISC USE AND DISCARD 1 PEN      NEEDLE SUBCUTANEOUSLY DAILYAS DIRECTED    Lancets (ONETOUCH DELICA PLUS [id number]) MISC USE AS DIRECTED EVERY DAY    liraglutide (VICTOZA) 18 MG/3ML SOPN INJECT SUBCUTANEOUSLY 1.8MG ONCE A DAY    loratadine (CLARITIN) 10 MG tablet Take 10 mg by mouth at bedtime. otc    lovastatin (MEVACOR) 20 MG tablet Take 1 tablet (20 mg total) by mouth daily.    metoprolol succinate (TOPROL-XL) 100 MG 24 hr tablet Take 1 tablet (100 mg total) by mouth daily. Take with or immediately following a meal.    ONETOUCH ULTRA test strip ONE TEST DAILY    pantoprazole (PROTONIX) 40 MG tablet TAKE 1 TABLET DAILY    spironolactone (ALDACTONE) 25 MG tablet Take 25 mg by mouth daily.    torsemide (DEMADEX) 10 MG tablet Take 10 mg by mouth daily as needed.    warfarin (COUMADIN) 2 MG tablet Take 2  tablets (4 mg total) by mouth daily. 01/13/2021: Pt taking on mon, wed, fri, sat & sun   warfarin (JANTOVEN) 5 MG tablet Take 1 tablet (5 mg total) by mouth daily. 01/13/2021: Pt takings on tues & thurs   No facility-administered encounter medications on file as of 03/11/2021.   Reviewed chart prior to disease state call. Spoke with patient regarding BP  Recent Office Vitals: BP Readings from Last 3 Encounters:  01/08/21 120/70  09/04/20 120/80  06/24/20 130/62   Pulse Readings from Last 3 Encounters:  01/08/21 64  09/04/20 64  06/24/20 72    Wt Readings from Last 3 Encounters:  01/08/21 247 lb (112 kg)  09/04/20 258 lb (117 kg)  06/24/20 273 lb (123.8 kg)     Kidney Function Lab Results  Component Value Date/Time   CREATININE 6.46 (H) 02/16/2020 02:34 PM   CREATININE 4.2 (A) 09/27/2019 12:00 AM   CREATININE 3.55 (H) 01/02/2019 10:42 AM   GFRNONAA 8 (L) 02/16/2020 02:34 PM   GFRAA 9 (L) 02/16/2020 02:34 PM    BMP Latest Ref Rng & Units 02/23/2020 02/16/2020 09/27/2019  Glucose 70 - 99 mg/dL - 109(H) -  BUN 8 - 23 mg/dL - 78(H) 54(A)  Creatinine 0.61 - 1.24 mg/dL - 6.46(H) 4.2(A)  BUN/Creat Ratio 10 - 24 - - -  Sodium 135 - 145 mmol/L - 138 -  Potassium 3.5 - 5.1 mmol/L 4.7 5.1 -  Chloride 98 - 111 mmol/L - 106 -  CO2 22 - 32 mmol/L - 21(L) -  Calcium 8.9 - 10.3 mg/dL - 9.6 -    Current antihypertensive regimen:  Spironolactone 25 mg Metoprolol succinate 100 mg qd Amlodipine 10 mg qd Furosemide 20 mg qd prn  How often are you checking your Blood Pressure? infrequently Current home BP readings: N/a What recent interventions/DTPs have been made by any provider to improve Blood Pressure control since last CPP Visit: N/a Any recent hospitalizations or ED visits since last visit with CPP? No What diet changes have been made to improve Blood Pressure Control?  Patient states that he is eating well. Typical Breakfast includes toast and fruit.  What exercise is being done to  improve your Blood Pressure Control?  Patient states that he goes for walks and gardens.   Misc Comment: Patient states that he checks his B/P 1-2 times weekly. Per patient his readings were 128/75 a week ago and 130/78 a couple weeks ago. Overall, patient states that he is doing well.  Adherence Review: Is the patient currently on ACE/ARB medication? No Does the patient have >5 day gap between last estimated fill dates? No    Star Rating Drugs: Lovastatin 20 mg Last filled 12/27/20 90 DS Metoprolol 100 mg Last filled 12/27/20 90 DS Victoza 18 mg (mail order pharmacy) Approximate fill date 12/28/20 Amlodipine 10 mg Last filled 12/28/20 90 DS Spironolactone 25 mg last filled 03/05/21 90 DS Furosemide 20 mg last filled 01/01/21 30 DS     Andee Poles, CMA

## 2021-03-12 ENCOUNTER — Other Ambulatory Visit: Payer: Self-pay

## 2021-03-12 ENCOUNTER — Telehealth: Payer: Self-pay

## 2021-03-12 DIAGNOSIS — Z7901 Long term (current) use of anticoagulants: Secondary | ICD-10-CM | POA: Diagnosis not present

## 2021-03-12 DIAGNOSIS — N185 Chronic kidney disease, stage 5: Secondary | ICD-10-CM | POA: Diagnosis not present

## 2021-03-12 DIAGNOSIS — R252 Cramp and spasm: Secondary | ICD-10-CM | POA: Diagnosis not present

## 2021-03-12 DIAGNOSIS — Q613 Polycystic kidney, unspecified: Secondary | ICD-10-CM | POA: Diagnosis not present

## 2021-03-12 DIAGNOSIS — N2581 Secondary hyperparathyroidism of renal origin: Secondary | ICD-10-CM | POA: Diagnosis not present

## 2021-03-12 DIAGNOSIS — I10 Essential (primary) hypertension: Secondary | ICD-10-CM | POA: Diagnosis not present

## 2021-03-12 DIAGNOSIS — M1A9XX0 Chronic gout, unspecified, without tophus (tophi): Secondary | ICD-10-CM | POA: Diagnosis not present

## 2021-03-12 NOTE — Telephone Encounter (Signed)
Copied from [place] [phone]. Topic: General - Inquiry >> Mar 12, 2021  7:52 AM Scherrie Gerlach wrote: Reason for CRM: pt needs an order for a PT INR to take to [facility]

## 2021-03-12 NOTE — Progress Notes (Signed)
Lab ordered.

## 2021-03-13 LAB — PROTIME-INR
INR: 5.2 (ref 0.9–1.2)
Prothrombin Time: 50.3 s — ABNORMAL HIGH (ref 9.1–12.0)

## 2021-03-13 NOTE — Telephone Encounter (Signed)
Critical result from team health triage of INR 5.2.  Currently taking 4 mg daily.  He was already advised to hold his dose today's.  Triage RN denies signs and symptoms of bleeding.  Would recommend also holding his dose Friday, then take 4 mg daily except for Wednesday and Saturday, take 3 mg.  Would repeat INR level on Monday.

## 2021-03-21 ENCOUNTER — Other Ambulatory Visit: Payer: Self-pay | Admitting: Family Medicine

## 2021-03-21 DIAGNOSIS — E790 Hyperuricemia without signs of inflammatory arthritis and tophaceous disease: Secondary | ICD-10-CM

## 2021-03-21 DIAGNOSIS — E782 Mixed hyperlipidemia: Secondary | ICD-10-CM

## 2021-03-21 DIAGNOSIS — I10 Essential (primary) hypertension: Secondary | ICD-10-CM

## 2021-03-21 NOTE — Telephone Encounter (Signed)
Requested medication (s) are due for refill today: Yes  Requested medication (s) are on the active medication list: Yes  Last refill:  12/23/20  Future visit scheduled: Yes  Notes to clinic:  Protocol indicates pt. Needs lab work.    Requested Prescriptions  Pending Prescriptions Disp Refills   allopurinol (ZYLOPRIM) 100 MG tablet [Pharmacy Med Name: ALLOPURINOL  TAB 100MG ] 90 tablet 0    Sig: TAKE 1 TABLET DAILY     Endocrinology:  Gout Agents Failed - 03/21/2021  8:10 AM      Failed - Uric Acid in normal range and within 360 days    No results found for: POCURA, LABURIC        Failed - Cr in normal range and within 360 days    Creatinine, Ser  Date Value Ref Range Status  02/16/2020 6.46 (H) 0.61 - 1.24 mg/dL Final          Passed - Valid encounter within last 12 months    Recent Outpatient Visits           2 months ago Type 2 diabetes mellitus without complication, without long-term current use of insulin ([place])   [facility] Juline Patch, MD   6 months ago Type 2 diabetes mellitus with diabetic nephropathy, without long-term current use of insulin ([place])   [facility] Juline Patch, MD   9 months ago Ganglion cyst of finger of left hand   [facility] Juline Patch, MD   10 months ago Type 2 diabetes mellitus with diabetic nephropathy, without long-term current use of insulin ([place])   [facility] Juline Patch, MD   1 year ago Hyperuricemia   [facility] Juline Patch, MD       Future Appointments             In 1 month Juline Patch, MD [facility]            Signed Prescriptions Disp Refills   metoprolol succinate (TOPROL-XL) 100 MG 24 hr tablet 90 tablet 1    Sig: TAKE 1 TABLET DAILY WITH ORIMMEDIATELY FOLLOWING A    MEAL     Cardiovascular:  Beta Blockers Passed - 03/21/2021  8:10 AM      Passed - Last BP in normal range    BP Readings from Last 1 Encounters:  01/08/21 120/70           Passed - Last Heart Rate in normal range    Pulse Readings from Last 1 Encounters:  01/08/21 64          Passed - Valid encounter within last 6 months    Recent Outpatient Visits           2 months ago Type 2 diabetes mellitus without complication, without long-term current use of insulin ([place])   [facility] Juline Patch, MD   6 months ago Type 2 diabetes mellitus with diabetic nephropathy, without long-term current use of insulin ([place])   [facility] Juline Patch, MD   9 months ago Ganglion cyst of finger of left hand   [facility] Juline Patch, MD   10 months ago Type 2 diabetes mellitus with diabetic nephropathy, without long-term current use of insulin ([place])   [facility] Juline Patch, MD   1 year ago Hyperuricemia   [facility] Juline Patch, MD  Future Appointments             In 1 month Jones, Iven Finn, MD [facility]             lovastatin (MEVACOR) 20 MG tablet 90 tablet 1    Sig: TAKE 1 TABLET DAILY     Cardiovascular:  Antilipid - Statins Failed - 03/21/2021  8:10 AM      Failed - HDL in normal range and within 360 days    HDL  Date Value Ref Range Status  05/02/2020 34 (L) >39 mg/dL Final          Passed - Total Cholesterol in normal range and within 360 days    Cholesterol, Total  Date Value Ref Range Status  05/02/2020 100 100 - 199 mg/dL Final          Passed - LDL in normal range and within 360 days    LDL Chol Calc (NIH)  Date Value Ref Range Status  05/02/2020 55 0 - 99 mg/dL Final          Passed - Triglycerides in normal range and within 360 days    Triglycerides  Date Value Ref Range Status  05/02/2020 42 0 - 149 mg/dL Final          Passed - Patient is not pregnant      Passed - Valid encounter within last 12 months    Recent Outpatient Visits           2 months ago Type 2 diabetes mellitus without complication, without long-term  current use of insulin ([place])   [facility] Juline Patch, MD   6 months ago Type 2 diabetes mellitus with diabetic nephropathy, without long-term current use of insulin ([place])   [facility] Juline Patch, MD   9 months ago Ganglion cyst of finger of left hand   [facility] Juline Patch, MD   10 months ago Type 2 diabetes mellitus with diabetic nephropathy, without long-term current use of insulin ([place])   [facility] Juline Patch, MD   1 year ago Hyperuricemia   [facility] Juline Patch, MD       Future Appointments             In 1 month Juline Patch, MD [facility], [facility]

## 2021-03-26 DIAGNOSIS — N2581 Secondary hyperparathyroidism of renal origin: Secondary | ICD-10-CM | POA: Diagnosis not present

## 2021-03-26 DIAGNOSIS — M1A9XX0 Chronic gout, unspecified, without tophus (tophi): Secondary | ICD-10-CM | POA: Diagnosis not present

## 2021-03-26 DIAGNOSIS — I10 Essential (primary) hypertension: Secondary | ICD-10-CM | POA: Diagnosis not present

## 2021-03-26 DIAGNOSIS — N185 Chronic kidney disease, stage 5: Secondary | ICD-10-CM | POA: Diagnosis not present

## 2021-04-01 ENCOUNTER — Ambulatory Visit: Payer: Medicare Other

## 2021-04-01 DIAGNOSIS — Z7901 Long term (current) use of anticoagulants: Secondary | ICD-10-CM

## 2021-04-02 ENCOUNTER — Telehealth: Payer: Self-pay

## 2021-04-02 LAB — PROTIME-INR
INR: 1.6 — ABNORMAL HIGH (ref 0.9–1.2)
Prothrombin Time: 16.7 s — ABNORMAL HIGH (ref 9.1–12.0)

## 2021-04-02 NOTE — Telephone Encounter (Signed)
Called pt with start 4mg  on T TH S S and 3mg  on M W F- offered cardio to help with assistance in warfarin control- pt did not want to go there. So, we will call nephrology and ask for their assistance with this pt.

## 2021-04-04 ENCOUNTER — Telehealth: Payer: Self-pay

## 2021-04-04 NOTE — Telephone Encounter (Signed)
Called pt concerning colonoscopy- he refuses that, but will do a FIT test in 2 weeks

## 2021-04-09 ENCOUNTER — Telehealth: Payer: Self-pay

## 2021-04-09 NOTE — Telephone Encounter (Signed)
Mark Bauer. From [facility] called about patient to let Dr Ronnald Ramp know that patient is willing to go on Eliquis prescription.  Please Advise. Call back # [phone]  [facility]

## 2021-04-10 ENCOUNTER — Other Ambulatory Visit: Payer: Self-pay | Admitting: Family Medicine

## 2021-04-10 DIAGNOSIS — E1121 Type 2 diabetes mellitus with diabetic nephropathy: Secondary | ICD-10-CM

## 2021-04-11 ENCOUNTER — Ambulatory Visit (INDEPENDENT_AMBULATORY_CARE_PROVIDER_SITE_OTHER): Payer: Medicare Other | Admitting: Family Medicine

## 2021-04-11 ENCOUNTER — Other Ambulatory Visit: Payer: Self-pay

## 2021-04-11 ENCOUNTER — Encounter: Payer: Self-pay | Admitting: Family Medicine

## 2021-04-11 VITALS — BP 120/80 | HR 68 | Ht 72.0 in | Wt 243.0 lb

## 2021-04-11 DIAGNOSIS — Z1211 Encounter for screening for malignant neoplasm of colon: Secondary | ICD-10-CM | POA: Diagnosis not present

## 2021-04-11 DIAGNOSIS — Z5181 Encounter for therapeutic drug level monitoring: Secondary | ICD-10-CM | POA: Diagnosis not present

## 2021-04-11 DIAGNOSIS — Z7901 Long term (current) use of anticoagulants: Secondary | ICD-10-CM

## 2021-04-11 DIAGNOSIS — I482 Chronic atrial fibrillation, unspecified: Secondary | ICD-10-CM

## 2021-04-11 NOTE — Addendum Note (Signed)
Addended by: Fredderick Severance on: 04/11/2021 10:21 AM   Modules accepted: Orders

## 2021-04-11 NOTE — Progress Notes (Signed)
Date:  04/11/2021   Name:  Mark Bauer   DOB:  20-Apr-1956   MRN:  [id number]   Chief Complaint: Atrial Fibrillation (Talk about Eliquis- gave samples to start off with)  Atrial Fibrillation Symptoms are negative for chest pain, dizziness, palpitations and shortness of breath. Past medical history includes atrial fibrillation.   Lab Results  Component Value Date   CREATININE 6.46 (H) 02/16/2020   BUN 78 (H) 02/16/2020   NA 138 02/16/2020   K 4.7 02/23/2020   CL 106 02/16/2020   CO2 21 (L) 02/16/2020   Lab Results  Component Value Date   CHOL 100 05/02/2020   HDL 34 (L) 05/02/2020   LDLCALC 55 05/02/2020   TRIG 42 05/02/2020   CHOLHDL 3.7 12/16/2016   Lab Results  Component Value Date   TSH 3.040 01/09/2019   Lab Results  Component Value Date   HGBA1C 5.6 01/08/2021   Lab Results  Component Value Date   WBC 6.2 02/16/2020   HGB 12.6 (L) 02/16/2020   HCT 37.1 (L) 02/16/2020   MCV 94.6 02/16/2020   PLT 157 02/16/2020   Lab Results  Component Value Date   ALT 34 06/09/2019   AST 26 06/09/2019   ALKPHOS 75 06/09/2019   BILITOT 0.5 06/09/2019     Review of Systems  Constitutional:  Negative for chills and fever.  HENT:  Negative for drooling, ear discharge, ear pain and sore throat.   Respiratory:  Negative for cough, shortness of breath and wheezing.   Cardiovascular:  Negative for chest pain, palpitations and leg swelling.  Gastrointestinal:  Negative for abdominal pain, blood in stool, constipation, diarrhea and nausea.  Endocrine: Negative for polydipsia.  Genitourinary:  Negative for dysuria, frequency, hematuria and urgency.  Musculoskeletal:  Negative for back pain, myalgias and neck pain.  Skin:  Negative for rash.  Allergic/Immunologic: Negative for environmental allergies.  Neurological:  Negative for dizziness and headaches.  Hematological:  Does not bruise/bleed easily.  Psychiatric/Behavioral:  Negative for suicidal ideas. The patient is  not nervous/anxious.    Patient Active Problem List   Diagnosis Date Noted   Complication of vascular access for dialysis 06/06/2020   Fistula 02/23/2020   Polycystic liver disease 01/10/2020   Polycystic kidney disease 25/36/6440   Metabolic acidosis 34/74/2595   CKD stage 4 due to type 2 diabetes mellitus ([place]) 01/25/2018   Type 2 diabetes mellitus with diabetic nephropathy, without long-term current use of insulin ([place]) 01/25/2018   Secondary hyperparathyroidism of renal origin ([place]) 08/19/2017   Chronic atrial fibrillation ([place]) 01/06/2017   Essential hypertension 01/06/2017   Hyperuricemia 01/06/2017   Mixed hyperlipidemia 01/06/2017   Gastroesophageal reflux disease 01/06/2017   Obesity 12/26/2012    Allergies  Allergen Reactions   Ciprofloxacin Other (See Comments)    hallucination   Pollen Extract Other (See Comments)    Sneezing, watery eyes, etc.   Caffeine Palpitations    palpatations    Past Surgical History:  Procedure Laterality Date   BASCILIC VEIN TRANSPOSITION Left 02/23/2020   Procedure: BASCILIC VEIN TRANSPOSITION (SINGLE STAGE);  Surgeon: Katha Cabal, MD;  Location: [facility] ORS;  Service: Vascular;  Laterality: Left;   COLONOSCOPY  2012   hemorrhoids   melanoma removal     forehead    Social History   Tobacco Use   Smoking status: Never   Smokeless tobacco: Never  Vaping Use   Vaping Use: Never used  Substance Use Topics   Alcohol use: No  Drug use: No     Medication list has been reviewed and updated.  Current Meds  Medication Sig   allopurinol (ZYLOPRIM) 100 MG tablet TAKE 1 TABLET DAILY   amLODipine (NORVASC) 10 MG tablet Take 1 tablet (10 mg total) by mouth daily.   b complex vitamins tablet Take 1 tablet by mouth daily.   blood glucose meter kit and supplies Dispense based on patient and insurance preference. Use up to four times daily as directed. (FOR ICD-10 E11.9).   Cholecalciferol 50 MCG (2000 UT) CAPS Take 2,000 Units by  mouth daily.    Insulin Pen Needle (NOVOFINE PEN NEEDLE) 32G X 6 MM MISC USE AND DISCARD 1 PEN      NEEDLE SUBCUTANEOUSLY DAILYAS DIRECTED   Lancets (ONETOUCH DELICA PLUS [id number]) MISC USE AS DIRECTED EVERY DAY   liraglutide (VICTOZA) 18 MG/3ML SOPN INJECT 1.8MG SUBCUTANEOUSLYONCE DAILY   loratadine (CLARITIN) 10 MG tablet Take 10 mg by mouth at bedtime. otc   lovastatin (MEVACOR) 20 MG tablet TAKE 1 TABLET DAILY   metoprolol succinate (TOPROL-XL) 100 MG 24 hr tablet TAKE 1 TABLET DAILY WITH ORIMMEDIATELY FOLLOWING A    MEAL   ONETOUCH ULTRA test strip ONE TEST DAILY   pantoprazole (PROTONIX) 40 MG tablet TAKE 1 TABLET DAILY   spironolactone (ALDACTONE) 25 MG tablet Take 25 mg by mouth daily.   warfarin (COUMADIN) 2 MG tablet Take 2 tablets (4 mg total) by mouth daily.   warfarin (JANTOVEN) 5 MG tablet Take 1 tablet (5 mg total) by mouth daily.    PHQ 2/9 Scores 01/13/2021 01/08/2021 09/04/2020 06/24/2020  PHQ - 2 Score 0 0 0 0  PHQ- 9 Score - 0 0 0    GAD 7 : Generalized Anxiety Score 01/08/2021 09/04/2020 06/24/2020 05/02/2020  Nervous, Anxious, on Edge 1 0 0 0  Control/stop worrying 1 0 0 0  Worry too much - different things 0 0 0 0  Trouble relaxing 0 0 0 0  Restless 0 0 0 0  Easily annoyed or irritable 0 0 0 0  Afraid - awful might happen 0 0 0 0  Total GAD 7 Score 2 0 0 0  Anxiety Difficulty Not difficult at all - - -    BP Readings from Last 3 Encounters:  04/11/21 120/80  01/08/21 120/70  09/04/20 120/80    Physical Exam Vitals and nursing note reviewed.  HENT:     Head: Normocephalic.     Right Ear: External ear normal.     Left Ear: External ear normal.     Nose: Nose normal.  Eyes:     General: No scleral icterus.       Right eye: No discharge.        Left eye: No discharge.     Conjunctiva/sclera: Conjunctivae normal.     Pupils: Pupils are equal, round, and reactive to light.  Neck:     Thyroid: No thyromegaly.     Vascular: No JVD.     Trachea: No  tracheal deviation.  Cardiovascular:     Rate and Rhythm: Normal rate and regular rhythm.     Heart sounds: Normal heart sounds. No murmur heard.   No friction rub. No gallop.  Pulmonary:     Effort: No respiratory distress.     Breath sounds: Normal breath sounds. No wheezing or rales.  Abdominal:     General: Bowel sounds are normal.     Palpations: Abdomen is soft. There is no mass.  Tenderness: There is no abdominal tenderness. There is no guarding or rebound.  Musculoskeletal:        General: No tenderness. Normal range of motion.     Cervical back: Normal range of motion and neck supple.  Lymphadenopathy:     Cervical: No cervical adenopathy.  Skin:    General: Skin is warm.     Findings: No rash.  Neurological:     Mental Status: He is alert.    Wt Readings from Last 3 Encounters:  04/11/21 243 lb (110.2 kg)  01/08/21 247 lb (112 kg)  09/04/20 258 lb (117 kg)    BP 120/80   Pulse 68   Ht 6' (1.829 m)   Wt 243 lb (110.2 kg)   BMI 32.96 kg/m   Assessment and Plan:  1. Chronic atrial fibrillation (HCC) Chronic.  Controlled.  Patient remains in chronic atrial fibrillation and is at risk for coagulation concern.  Previous control has been on Coumadin but as there is been gradual deterioration of the renal function becoming more more difficult to achieve steady state PT/INR readings. - Protime-INR  2. Encounter for monitoring bridging anticoagulation therapy Given the fact that there is continued deterioration of renal function with conventional hemodialysis decisions have been made as to what direction to go with anticoagulation therapy.  Eliquis is the likely decision at lower dose of 2.5 daily and therefore we are switching to Eliquis therapy by discontinuing the Coumadin therapy starting tonight, obtaining PT/INR and if INR under 2 we will initiate Eliquis 2.5 mg daily.  3.  Colon cancer screening patient has agreed to FIT testing and has been given  materials.

## 2021-04-12 LAB — PROTIME-INR
INR: 2.6 — ABNORMAL HIGH (ref 0.9–1.2)
Prothrombin Time: 26.2 s — ABNORMAL HIGH (ref 9.1–12.0)

## 2021-04-16 ENCOUNTER — Other Ambulatory Visit: Payer: Medicare Other

## 2021-04-16 DIAGNOSIS — Z1211 Encounter for screening for malignant neoplasm of colon: Secondary | ICD-10-CM | POA: Diagnosis not present

## 2021-04-16 DIAGNOSIS — I482 Chronic atrial fibrillation, unspecified: Secondary | ICD-10-CM | POA: Diagnosis not present

## 2021-04-17 DIAGNOSIS — Z23 Encounter for immunization: Secondary | ICD-10-CM | POA: Diagnosis not present

## 2021-04-17 LAB — PROTIME-INR
INR: 1.2 (ref 0.9–1.2)
Prothrombin Time: 12.5 s — ABNORMAL HIGH (ref 9.1–12.0)

## 2021-04-18 LAB — FECAL OCCULT BLOOD, IMMUNOCHEMICAL: Fecal Occult Bld: NEGATIVE

## 2021-04-22 ENCOUNTER — Telehealth: Payer: Medicare Other

## 2021-05-12 ENCOUNTER — Ambulatory Visit (INDEPENDENT_AMBULATORY_CARE_PROVIDER_SITE_OTHER): Payer: Medicare Other | Admitting: Family Medicine

## 2021-05-12 ENCOUNTER — Other Ambulatory Visit: Payer: Self-pay

## 2021-05-12 ENCOUNTER — Encounter: Payer: Self-pay | Admitting: Family Medicine

## 2021-05-12 VITALS — BP 130/80 | HR 80 | Ht 72.0 in | Wt 239.0 lb

## 2021-05-12 DIAGNOSIS — Z7901 Long term (current) use of anticoagulants: Secondary | ICD-10-CM | POA: Diagnosis not present

## 2021-05-12 DIAGNOSIS — E1121 Type 2 diabetes mellitus with diabetic nephropathy: Secondary | ICD-10-CM | POA: Diagnosis not present

## 2021-05-12 DIAGNOSIS — I482 Chronic atrial fibrillation, unspecified: Secondary | ICD-10-CM | POA: Diagnosis not present

## 2021-05-12 DIAGNOSIS — E1122 Type 2 diabetes mellitus with diabetic chronic kidney disease: Secondary | ICD-10-CM

## 2021-05-12 DIAGNOSIS — N184 Chronic kidney disease, stage 4 (severe): Secondary | ICD-10-CM

## 2021-05-12 MED ORDER — APIXABAN 2.5 MG PO TABS: 2.5000 mg | ORAL_TABLET | Freq: Every day | ORAL | 1 refills | Status: DC

## 2021-05-12 NOTE — Progress Notes (Signed)
Date:  05/12/2021   Name:  Mark Bauer   DOB:  [birth year]   MRN:  [id number]   Chief Complaint: Atrial Fibrillation  Atrial Fibrillation Presents for follow-up visit. Symptoms are negative for an AICD problem, bradycardia, chest pain, dizziness, hypertension, hypotension, palpitations, shortness of breath, syncope, tachycardia and weakness. The symptoms have been improving. Past medical history includes atrial fibrillation.  Diabetes Pertinent negatives for hypoglycemia include no confusion, dizziness, headaches, hunger, mood changes, nervousness/anxiousness, pallor, seizures, sleepiness, speech difficulty, sweats or tremors. Pertinent negatives for diabetes include no chest pain, no fatigue, no polydipsia, no polyphagia, no polyuria, no weakness and no weight loss. There are no hypoglycemic complications. Symptoms are stable. There are no diabetic complications. Risk factors for coronary artery disease include dyslipidemia and hypertension. Current diabetic treatments: victoza. He is compliant with treatment all of the time. His weight is stable. He is following a generally healthy diet. Meal planning includes avoidance of concentrated sweets and carbohydrate counting. He participates in exercise every other day. His breakfast blood glucose is taken between 8-9 am. His breakfast blood glucose range is generally 90-110 mg/dl. An ACE inhibitor/angiotensin II receptor blocker is not being taken. Eye exam is current.   Lab Results  Component Value Date   CREATININE 6.46 (H) 02/16/2020   BUN 78 (H) 02/16/2020   NA 138 02/16/2020   K 4.7 02/23/2020   CL 106 02/16/2020   CO2 21 (L) 02/16/2020   Lab Results  Component Value Date   CHOL 100 05/02/2020   HDL 34 (L) 05/02/2020   LDLCALC 55 05/02/2020   TRIG 42 05/02/2020   CHOLHDL 3.7 12/16/2016   Lab Results  Component Value Date   TSH 3.040 01/09/2019   Lab Results  Component Value Date   HGBA1C 5.6 01/08/2021   Lab Results   Component Value Date   WBC 6.2 02/16/2020   HGB 12.6 (L) 02/16/2020   HCT 37.1 (L) 02/16/2020   MCV 94.6 02/16/2020   PLT 157 02/16/2020   Lab Results  Component Value Date   ALT 34 06/09/2019   AST 26 06/09/2019   ALKPHOS 75 06/09/2019   BILITOT 0.5 06/09/2019     Review of Systems  Constitutional:  Negative for fatigue and weight loss.  Respiratory:  Negative for shortness of breath.   Cardiovascular:  Negative for chest pain, palpitations and syncope.  Endocrine: Negative for polydipsia, polyphagia and polyuria.  Skin:  Negative for pallor.  Neurological:  Negative for dizziness, tremors, seizures, speech difficulty, weakness and headaches.  Psychiatric/Behavioral:  Negative for confusion. The patient is not nervous/anxious.    Patient Active Problem List   Diagnosis Date Noted   Complication of vascular access for dialysis 06/06/2020   Fistula 02/23/2020   Polycystic liver disease 01/10/2020   Polycystic kidney disease 70/26/3785   Metabolic acidosis 88/50/2774   CKD stage 4 due to type 2 diabetes mellitus ([place]) 01/25/2018   Type 2 diabetes mellitus with diabetic nephropathy, without long-term current use of insulin ([place]) 01/25/2018   Secondary hyperparathyroidism of renal origin ([place]) 08/19/2017   Chronic atrial fibrillation ([place]) 01/06/2017   Essential hypertension 01/06/2017   Hyperuricemia 01/06/2017   Mixed hyperlipidemia 01/06/2017   Gastroesophageal reflux disease 01/06/2017   Obesity 12/26/2012    Allergies  Allergen Reactions   Ciprofloxacin Other (See Comments)    hallucination   Pollen Extract Other (See Comments)    Sneezing, watery eyes, etc.   Caffeine Palpitations    palpatations  Past Surgical History:  Procedure Laterality Date   BASCILIC VEIN TRANSPOSITION Left 02/23/2020   Procedure: BASCILIC VEIN TRANSPOSITION (SINGLE STAGE);  Surgeon: Katha Cabal, MD;  Location: [facility] ORS;  Service: Vascular;  Laterality: Left;   COLONOSCOPY   2012   hemorrhoids   melanoma removal     forehead    Social History   Tobacco Use   Smoking status: Never   Smokeless tobacco: Never  Vaping Use   Vaping Use: Never used  Substance Use Topics   Alcohol use: No   Drug use: No     Medication list has been reviewed and updated.  Current Meds  Medication Sig   allopurinol (ZYLOPRIM) 100 MG tablet TAKE 1 TABLET DAILY   amLODipine (NORVASC) 10 MG tablet Take 1 tablet (10 mg total) by mouth daily.   apixaban (ELIQUIS) 2.5 MG TABS tablet Take 2.5 mg by mouth daily.   b complex vitamins tablet Take 1 tablet by mouth daily.   blood glucose meter kit and supplies Dispense based on patient and insurance preference. Use up to four times daily as directed. (FOR ICD-10 E11.9).   Cholecalciferol 50 MCG (2000 UT) CAPS Take 2,000 Units by mouth daily.    furosemide (LASIX) 20 MG tablet Take 20 mg by mouth daily.   Insulin Pen Needle (NOVOFINE PEN NEEDLE) 32G X 6 MM MISC USE AND DISCARD 1 PEN      NEEDLE SUBCUTANEOUSLY DAILYAS DIRECTED   Lancets (ONETOUCH DELICA PLUS [id number]) MISC USE AS DIRECTED EVERY DAY   liraglutide (VICTOZA) 18 MG/3ML SOPN INJECT 1.8MG SUBCUTANEOUSLYONCE DAILY   loratadine (CLARITIN) 10 MG tablet Take 10 mg by mouth at bedtime. otc   lovastatin (MEVACOR) 20 MG tablet TAKE 1 TABLET DAILY   metoprolol succinate (TOPROL-XL) 100 MG 24 hr tablet TAKE 1 TABLET DAILY WITH ORIMMEDIATELY FOLLOWING A    MEAL   ONETOUCH ULTRA test strip ONE TEST DAILY   pantoprazole (PROTONIX) 40 MG tablet TAKE 1 TABLET DAILY   spironolactone (ALDACTONE) 25 MG tablet Take 25 mg by mouth daily.    PHQ 2/9 Scores 05/12/2021 01/13/2021 01/08/2021 09/04/2020  PHQ - 2 Score 0 0 0 0  PHQ- 9 Score 0 - 0 0    GAD 7 : Generalized Anxiety Score 05/12/2021 01/08/2021 09/04/2020 06/24/2020  Nervous, Anxious, on Edge 0 1 0 0  Control/stop worrying 0 1 0 0  Worry too much - different things 0 0 0 0  Trouble relaxing 0 0 0 0  Restless 0 0 0 0  Easily  annoyed or irritable 0 0 0 0  Afraid - awful might happen 0 0 0 0  Total GAD 7 Score 0 2 0 0  Anxiety Difficulty - Not difficult at all - -    BP Readings from Last 3 Encounters:  05/12/21 130/80  04/11/21 120/80  01/08/21 120/70    Physical Exam Vitals and nursing note reviewed.  HENT:     Head: Normocephalic.     Right Ear: External ear normal.     Left Ear: External ear normal.     Nose: Nose normal.  Eyes:     General: No scleral icterus.       Right eye: No discharge.        Left eye: No discharge.     Conjunctiva/sclera: Conjunctivae normal.     Pupils: Pupils are equal, round, and reactive to light.  Neck:     Thyroid: No thyromegaly.     Vascular: No  JVD.     Trachea: No tracheal deviation.  Cardiovascular:     Rate and Rhythm: Normal rate and regular rhythm.     Heart sounds: Normal heart sounds. No murmur heard.   No friction rub. No gallop.  Pulmonary:     Effort: No respiratory distress.     Breath sounds: Normal breath sounds. No wheezing or rales.  Abdominal:     General: Bowel sounds are normal.     Palpations: Abdomen is soft. There is no mass.     Tenderness: There is no abdominal tenderness. There is no guarding or rebound.  Musculoskeletal:        General: No tenderness. Normal range of motion.     Cervical back: Normal range of motion and neck supple.  Lymphadenopathy:     Cervical: No cervical adenopathy.  Skin:    General: Skin is warm.     Findings: No rash.  Neurological:     Mental Status: He is alert and oriented to person, place, and time.     Cranial Nerves: No cranial nerve deficit.    Wt Readings from Last 3 Encounters:  05/12/21 239 lb (108.4 kg)  04/11/21 243 lb (110.2 kg)  01/08/21 247 lb (112 kg)    BP 130/80   Pulse 80   Ht 6' (1.829 m)   Wt 239 lb (108.4 kg)   BMI 32.41 kg/m   Assessment and Plan:  1. Chronic atrial fibrillation (HCC) Chronic.  Controlled.  Stable.  Patient will continue on current dosing of  Eliquis 2.5 mg once a day. - apixaban (ELIQUIS) 2.5 MG TABS tablet; Take 1 tablet (2.5 mg total) by mouth daily.  Dispense: 90 tablet; Refill: 1  2. Anticoagulated Longstanding.  Was controlled on warfarin in the past however given his gradually worsening GFR patient has had to switch to Eliquis.  Patient is tolerating well and hopefully with the prescription patient will be in an affordable aspect for this medication. - INR/PT  3. Type 2 diabetes mellitus with diabetic nephropathy, without long-term current use of insulin (HCC) Chronic.  Controlled.  Stable.  Currently is on Victoza 18 mg daily.  We will check A1c for current level of control. - HgB A1c  4. CKD stage 4 due to type 2 diabetes mellitus ([place]) Followed by nephrology and because of the gradually worsening GFR patient has currently been switched to Eliquis which will be more stable and less at to have any coagulation extremes  - apixaban (ELIQUIS) 2.5 MG TABS tablet; Take 1 tablet (2.5 mg total) by mouth daily.  Dispense: 90 tablet; Refill: 1

## 2021-05-13 LAB — HEMOGLOBIN A1C
Est. average glucose Bld gHb Est-mCnc: 111 mg/dL
Hgb A1c MFr Bld: 5.5 % (ref 4.8–5.6)

## 2021-05-13 LAB — PROTIME-INR
INR: 1.1 (ref 0.9–1.2)
Prothrombin Time: 11.8 s (ref 9.1–12.0)

## 2021-06-04 DIAGNOSIS — N186 End stage renal disease: Secondary | ICD-10-CM | POA: Insufficient documentation

## 2021-06-04 NOTE — Progress Notes (Signed)
MRN : [id number]  Mark Bauer is a 65 y.o. ([birth year]) male who presents with chief complaint of check access.  History of Present Illness:   The patient returns to the office for followup of their dialysis access.  He is status post transposition of a left basilic vein February 23, 5955.    He is not yet on dialysis.   The function of the access has been stable. He remains is symptom-free. The patient denies hand pain or other symptoms consistent with steal phenomena.  No significant arm swelling.   The patient denies redness or swelling at the access site. The patient denies fever or chills at home or while on dialysis.   The patient denies amaurosis fugax or recent TIA symptoms. There are no recent neurological changes noted. The patient denies claudication symptoms or rest pain symptoms. The patient denies history of DVT, PE or superficial thrombophlebitis. The patient denies recent episodes of angina or shortness of breath.    Duplex ultrasound of the AV access shows a patent access with uniform velocities.  Mild narrowing is noted in the vein but no focal hemodynamically significant stenosis.  Flow volume today is 2234 ml/min.   No outpatient medications have been marked as taking for the 06/05/21 encounter (Appointment) with Delana Meyer, Dolores Lory, MD.    Past Medical History:  Diagnosis Date   Allergy    Bilateral undescended testicles    Cancer ([place])    melanoma on forehead   Cataract    OU   Diabetes mellitus without complication ([place])    Dysrhythmia    a fib   GERD (gastroesophageal reflux disease)    Gout    Hepatitis    polycystic    Hypertension    Hypokalemia    Mixed hyperlipidemia    Polycystic kidney    Vitamin D deficiency     Past Surgical History:  Procedure Laterality Date   BASCILIC VEIN TRANSPOSITION Left 02/23/2020   Procedure: BASCILIC VEIN TRANSPOSITION (SINGLE STAGE);  Surgeon: Katha Cabal, MD;  Location: [facility] ORS;  Service:  Vascular;  Laterality: Left;   COLONOSCOPY  2012   hemorrhoids   melanoma removal     forehead    Social History Social History   Tobacco Use   Smoking status: Never   Smokeless tobacco: Never  Vaping Use   Vaping Use: Never used  Substance Use Topics   Alcohol use: No   Drug use: No    Family History Family History  Problem Relation Age of Onset   Diabetes Mother    Heart disease Father     Allergies  Allergen Reactions   Ciprofloxacin Other (See Comments)    hallucination   Pollen Extract Other (See Comments)    Sneezing, watery eyes, etc.   Caffeine Palpitations    palpatations     REVIEW OF SYSTEMS (Negative unless checked)  Constitutional: [] Weight loss  [] Fever  [] Chills Cardiac: [] Chest pain   [] Chest pressure   [] Palpitations   [] Shortness of breath when laying flat   [] Shortness of breath with exertion. Vascular:  [] Pain in legs with walking   [] Pain in legs at rest  [] History of DVT   [] Phlebitis   [] Swelling in legs   [] Varicose veins   [] Non-healing ulcers Pulmonary:   [] Uses home oxygen   [] Productive cough   [] Hemoptysis   [] Wheeze  [] COPD   [] Asthma Neurologic:  [] Dizziness   [] Seizures   [] History of stroke   [] History of TIA  []   Aphasia   [] Vissual changes   [] Weakness or numbness in arm   [] Weakness or numbness in leg Musculoskeletal:   [] Joint swelling   [] Joint pain   [] Low back pain Hematologic:  [] Easy bruising  [] Easy bleeding   [] Hypercoagulable state   [] Anemic Gastrointestinal:  [] Diarrhea   [] Vomiting  [] Gastroesophageal reflux/heartburn   [] Difficulty swallowing. Genitourinary:  [x] Chronic kidney disease   [] Difficult urination  [] Frequent urination   [] Blood in urine Skin:  [] Rashes   [] Ulcers  Psychological:  [] History of anxiety   []  History of major depression.  Physical Examination  There were no vitals filed for this visit. There is no height or weight on file to calculate BMI. Gen: WD/WN, NAD Head: [place]/AT, No temporalis  wasting.  Ear/Nose/Throat: Hearing grossly intact, nares w/o erythema or drainage Eyes: PER, EOMI, sclera nonicteric.  Neck: Supple, no gross masses or lesions.  No JVD.  Pulmonary:  Good air movement, no audible wheezing, no use of accessory muscles.  Cardiac: RRR, precordium non-hyperdynamic. Vascular:   left basilic transposition good thrill and good bruit. Vessel Right Left  Radial Palpable Palpable  Brachial Palpable Palpable  Gastrointestinal: soft, non-distended. No guarding/no peritoneal signs.  Musculoskeletal: M/S 5/5 throughout.  No deformity.  Neurologic: CN 2-12 intact. Pain and light touch intact in extremities.  Symmetrical.  Speech is fluent. Motor exam as listed above. Psychiatric: Judgment intact, Mood & affect appropriate for pt's clinical situation. Dermatologic: No rashes or ulcers noted.  No changes consistent with cellulitis.   CBC Lab Results  Component Value Date   WBC 6.2 02/16/2020   HGB 12.6 (L) 02/16/2020   HCT 37.1 (L) 02/16/2020   MCV 94.6 02/16/2020   PLT 157 02/16/2020    BMET    Component Value Date/Time   NA 138 02/16/2020 1434   NA 142 01/02/2019 1042   K 4.7 02/23/2020 0644   CL 106 02/16/2020 1434   CO2 21 (L) 02/16/2020 1434   GLUCOSE 109 (H) 02/16/2020 1434   BUN 78 (H) 02/16/2020 1434   BUN 54 (A) 09/27/2019 0000   CREATININE 6.46 (H) 02/16/2020 1434   CALCIUM 9.6 02/16/2020 1434   GFRNONAA 8 (L) 02/16/2020 1434   GFRAA 9 (L) 02/16/2020 1434   CrCl cannot be calculated (Patient's most recent lab result is older than the maximum 21 days allowed.).  COAG Lab Results  Component Value Date   INR 1.1 05/12/2021   INR 1.2 04/16/2021   INR 2.6 (H) 04/11/2021    Radiology No results found.   Assessment/Plan 1. End stage renal disease ([place]) Recommend:  The patient is doing well and currently has adequate dialysis access. The patient's dialysis center is not reporting any access issues. Flow pattern is stable when compared  to the prior ultrasound.  The patient should have a duplex ultrasound of the dialysis access in 12 months.  The patient will follow-up with me in the office after each ultrasound    - VAS [place] (AVF, AVG); Future  2. Mixed hyperlipidemia Continue statin as ordered and reviewed, no changes at this time   3. Essential hypertension Continue antihypertensive medications as already ordered, these medications have been reviewed and there are no changes at this time.   4. Chronic atrial fibrillation (HCC) Continue antiarrhythmia medications as already ordered, these medications have been reviewed and there are no changes at this time.  Continue anticoagulation as ordered by Cardiology Service     Hortencia Pilar, MD  06/04/2021 8:28 AM

## 2021-06-05 ENCOUNTER — Ambulatory Visit (INDEPENDENT_AMBULATORY_CARE_PROVIDER_SITE_OTHER): Payer: Medicare Other | Admitting: Vascular Surgery

## 2021-06-05 ENCOUNTER — Other Ambulatory Visit: Payer: Self-pay

## 2021-06-05 ENCOUNTER — Ambulatory Visit (INDEPENDENT_AMBULATORY_CARE_PROVIDER_SITE_OTHER): Payer: Medicare Other

## 2021-06-05 ENCOUNTER — Encounter (INDEPENDENT_AMBULATORY_CARE_PROVIDER_SITE_OTHER): Payer: Self-pay | Admitting: Vascular Surgery

## 2021-06-05 VITALS — BP 118/75 | HR 97 | Resp 16 | Wt 239.4 lb

## 2021-06-05 DIAGNOSIS — E782 Mixed hyperlipidemia: Secondary | ICD-10-CM | POA: Diagnosis not present

## 2021-06-05 DIAGNOSIS — N186 End stage renal disease: Secondary | ICD-10-CM

## 2021-06-05 DIAGNOSIS — I10 Essential (primary) hypertension: Secondary | ICD-10-CM | POA: Diagnosis not present

## 2021-06-05 DIAGNOSIS — I482 Chronic atrial fibrillation, unspecified: Secondary | ICD-10-CM | POA: Diagnosis not present

## 2021-06-05 DIAGNOSIS — T829XXS Unspecified complication of cardiac and vascular prosthetic device, implant and graft, sequela: Secondary | ICD-10-CM

## 2021-06-08 ENCOUNTER — Encounter (INDEPENDENT_AMBULATORY_CARE_PROVIDER_SITE_OTHER): Payer: Self-pay | Admitting: Vascular Surgery

## 2021-06-19 ENCOUNTER — Other Ambulatory Visit: Payer: Self-pay | Admitting: Family Medicine

## 2021-06-19 DIAGNOSIS — E790 Hyperuricemia without signs of inflammatory arthritis and tophaceous disease: Secondary | ICD-10-CM

## 2021-06-19 DIAGNOSIS — K219 Gastro-esophageal reflux disease without esophagitis: Secondary | ICD-10-CM

## 2021-06-20 NOTE — Telephone Encounter (Signed)
Requested Prescriptions  Pending Prescriptions Disp Refills  . pantoprazole (PROTONIX) 40 MG tablet [Pharmacy Med Name: PANTOPRAZOLE TAB 40MG  DR] 90 tablet 1    Sig: TAKE 1 TABLET DAILY     Gastroenterology: Proton Pump Inhibitors Passed - 06/19/2021  2:14 AM      Passed - Valid encounter within last 12 months    Recent Outpatient Visits          1 month ago Chronic atrial fibrillation ([place])   [facility] Juline Patch, MD   2 months ago Chronic atrial fibrillation [facility])   [facility] Juline Patch, MD   5 months ago Type 2 diabetes mellitus without complication, without long-term current use of insulin ([place])   [facility] Juline Patch, MD   9 months ago Type 2 diabetes mellitus with diabetic nephropathy, without long-term current use of insulin ([place])   [facility] Juline Patch, MD   12 months ago Ganglion cyst of finger of left hand   [facility] Juline Patch, MD      Future Appointments            In 2 months Juline Patch, MD [facility] [place], [place]           . allopurinol (ZYLOPRIM) 100 MG tablet [Pharmacy Med Name: ALLOPURINOL  TAB 100MG ] 90 tablet 0    Sig: TAKE 1 TABLET DAILY     Endocrinology:  Gout Agents Failed - 06/19/2021  2:14 AM      Failed - Uric Acid in normal range and within 360 days    No results found for: POCURA, LABURIC       Failed - Cr in normal range and within 360 days    Creatinine, Ser  Date Value Ref Range Status  02/16/2020 6.46 (H) 0.61 - 1.24 mg/dL Final         Passed - Valid encounter within last 12 months    Recent Outpatient Visits          1 month ago Chronic atrial fibrillation ([place])   [facility] Juline Patch, MD   2 months ago Chronic atrial fibrillation [facility])   [facility] Juline Patch, MD   5 months ago Type 2 diabetes mellitus without complication, without long-term current use of insulin ([place])   [facility] Juline Patch, MD   9 months ago Type 2 diabetes mellitus with diabetic nephropathy, without long-term current use of insulin ([place])   [facility] Juline Patch, MD   12 months ago Ganglion cyst of finger of left hand   [facility] Juline Patch, MD      Future Appointments            In 2 months Juline Patch, MD [facility], [facility]

## 2021-06-20 NOTE — Telephone Encounter (Signed)
Requested medication (s) are due for refill today: yes  Requested medication (s) are on the active medication list: yes  Last refill:  03/27/21  Future visit scheduled: 09/12/21  Notes to clinic:  Failed protocol of labs within 360 days, has upcoming appt, please assess.       Requested Prescriptions  Pending Prescriptions Disp Refills   allopurinol (ZYLOPRIM) 100 MG tablet [Pharmacy Med Name: ALLOPURINOL  TAB 100MG ] 90 tablet 0    Sig: TAKE 1 TABLET DAILY     Endocrinology:  Gout Agents Failed - 06/19/2021  2:14 AM      Failed - Uric Acid in normal range and within 360 days    No results found for: POCURA, LABURIC        Failed - Cr in normal range and within 360 days    Creatinine, Ser  Date Value Ref Range Status  02/16/2020 6.46 (H) 0.61 - 1.24 mg/dL Final          Passed - Valid encounter within last 12 months    Recent Outpatient Visits           1 month ago Chronic atrial fibrillation ([place])   [facility], MD   2 months ago Chronic atrial fibrillation [facility])   [facility] Juline Patch, MD   5 months ago Type 2 diabetes mellitus without complication, without long-term current use of insulin ([place])   [facility] Juline Patch, MD   9 months ago Type 2 diabetes mellitus with diabetic nephropathy, without long-term current use of insulin ([place])   [facility] Juline Patch, MD   12 months ago Ganglion cyst of finger of left hand   [facility] Juline Patch, MD       Future Appointments             In 2 months Juline Patch, MD [facility]            Signed Prescriptions Disp Refills   pantoprazole (PROTONIX) 40 MG tablet 90 tablet 1    Sig: TAKE 1 TABLET DAILY     Gastroenterology: Proton Pump Inhibitors Passed - 06/19/2021  2:14 AM      Passed - Valid encounter within last 12 months    Recent Outpatient Visits           1 month ago Chronic atrial fibrillation  ([place])   [facility] Juline Patch, MD   2 months ago Chronic atrial fibrillation [facility])   [facility] Juline Patch, MD   5 months ago Type 2 diabetes mellitus without complication, without long-term current use of insulin ([place])   [facility] Juline Patch, MD   9 months ago Type 2 diabetes mellitus with diabetic nephropathy, without long-term current use of insulin ([place])   [facility] Juline Patch, MD   12 months ago Ganglion cyst of finger of left hand   [facility] Juline Patch, MD       Future Appointments             In 2 months Juline Patch, MD [facility], [facility]

## 2021-06-23 DIAGNOSIS — Q613 Polycystic kidney, unspecified: Secondary | ICD-10-CM | POA: Diagnosis not present

## 2021-06-23 DIAGNOSIS — I10 Essential (primary) hypertension: Secondary | ICD-10-CM | POA: Diagnosis not present

## 2021-06-23 DIAGNOSIS — N185 Chronic kidney disease, stage 5: Secondary | ICD-10-CM | POA: Diagnosis not present

## 2021-07-02 DIAGNOSIS — D631 Anemia in chronic kidney disease: Secondary | ICD-10-CM | POA: Diagnosis not present

## 2021-07-02 DIAGNOSIS — Z6832 Body mass index (BMI) 32.0-32.9, adult: Secondary | ICD-10-CM | POA: Diagnosis not present

## 2021-07-02 DIAGNOSIS — N185 Chronic kidney disease, stage 5: Secondary | ICD-10-CM | POA: Diagnosis not present

## 2021-07-02 DIAGNOSIS — M1A9XX0 Chronic gout, unspecified, without tophus (tophi): Secondary | ICD-10-CM | POA: Diagnosis not present

## 2021-07-02 DIAGNOSIS — N184 Chronic kidney disease, stage 4 (severe): Secondary | ICD-10-CM | POA: Diagnosis not present

## 2021-07-02 DIAGNOSIS — I10 Essential (primary) hypertension: Secondary | ICD-10-CM | POA: Diagnosis not present

## 2021-07-02 DIAGNOSIS — Q613 Polycystic kidney, unspecified: Secondary | ICD-10-CM | POA: Diagnosis not present

## 2021-07-02 DIAGNOSIS — N2581 Secondary hyperparathyroidism of renal origin: Secondary | ICD-10-CM | POA: Diagnosis not present

## 2021-07-21 ENCOUNTER — Other Ambulatory Visit: Payer: Self-pay | Admitting: Family Medicine

## 2021-07-21 DIAGNOSIS — E1121 Type 2 diabetes mellitus with diabetic nephropathy: Secondary | ICD-10-CM

## 2021-08-05 ENCOUNTER — Other Ambulatory Visit: Payer: Self-pay | Admitting: Family Medicine

## 2021-08-05 DIAGNOSIS — I10 Essential (primary) hypertension: Secondary | ICD-10-CM

## 2021-08-05 MED ORDER — AMLODIPINE BESYLATE 10 MG PO TABS: 10.0000 mg | ORAL_TABLET | Freq: Every day | ORAL | 0 refills | Status: DC

## 2021-08-05 MED ORDER — AMLODIPINE BESYLATE 10 MG PO TABS: 10.0000 mg | ORAL_TABLET | Freq: Every day | ORAL | 1 refills | Status: DC

## 2021-08-05 NOTE — Telephone Encounter (Signed)
Copied from [place] [id number]. Topic: Quick Communication - Rx Refill/Question >> Aug 05, 2021 10:46 AM Yvette Rack wrote: Pt completely out of blood pressure medication and requests 30 day supply to be sent [id number] [place] [place] and 90 day supply sent to mail order pharmacy: [place]   Medication: amLODipine (NORVASC) 10 MG tablet  Has the patient contacted their pharmacy? Yes.  Pt told to contact provider. (Agent: If no, request that the patient contact the pharmacy for the refill. If patient does not wish to contact the pharmacy document the reason why and proceed with request.) (Agent: If yes, when and what did the pharmacy advise?)  Preferred Pharmacy (with phone number or street name): [facility] [place] [place] [facility]  Phone:  [phone] Fax:  [phone]  Has the patient been seen for an appointment in the last year OR does the patient have an upcoming appointment? Yes.    Agent: Please be advised that RX refills may take up to 3 business days. We ask that you follow-up with your pharmacy.

## 2021-09-12 ENCOUNTER — Ambulatory Visit (INDEPENDENT_AMBULATORY_CARE_PROVIDER_SITE_OTHER): Payer: Medicare Other | Admitting: Family Medicine

## 2021-09-12 ENCOUNTER — Other Ambulatory Visit: Payer: Self-pay

## 2021-09-12 ENCOUNTER — Encounter: Payer: Self-pay | Admitting: Family Medicine

## 2021-09-12 VITALS — BP 122/56 | HR 82 | Ht 72.0 in | Wt 228.0 lb

## 2021-09-12 DIAGNOSIS — R040 Epistaxis: Secondary | ICD-10-CM | POA: Diagnosis not present

## 2021-09-12 DIAGNOSIS — R69 Illness, unspecified: Secondary | ICD-10-CM | POA: Diagnosis not present

## 2021-09-12 DIAGNOSIS — E1121 Type 2 diabetes mellitus with diabetic nephropathy: Secondary | ICD-10-CM

## 2021-09-12 DIAGNOSIS — R233 Spontaneous ecchymoses: Secondary | ICD-10-CM

## 2021-09-12 MED ORDER — VICTOZA 18 MG/3ML ~~LOC~~ SOPN: PEN_INJECTOR | SUBCUTANEOUS | 1 refills | Status: DC

## 2021-09-12 NOTE — Progress Notes (Signed)
Date:  09/12/2021   Name:  Mark Bauer   DOB:  05/22/1956   MRN:  [id number]   Chief Complaint: Diabetes  Diabetes He presents for his follow-up diabetic visit. He has type 2 diabetes mellitus. His disease course has been stable. There are no hypoglycemic associated symptoms. Pertinent negatives for hypoglycemia include no dizziness, headaches or nervousness/anxiousness. Pertinent negatives for diabetes include no blurred vision, no chest pain, no fatigue, no foot paresthesias, no foot ulcerations, no polydipsia, no polyphagia, no polyuria, no visual change, no weakness and no weight loss. There are no hypoglycemic complications. Symptoms are stable. There are no diabetic complications. There are no known risk factors for coronary artery disease.   Lab Results  Component Value Date   NA 138 02/16/2020   K 4.7 02/23/2020   CO2 21 (L) 02/16/2020   GLUCOSE 109 (H) 02/16/2020   BUN 78 (H) 02/16/2020   CREATININE 6.46 (H) 02/16/2020   CALCIUM 9.6 02/16/2020   GFRNONAA 8 (L) 02/16/2020   Lab Results  Component Value Date   CHOL 100 05/02/2020   HDL 34 (L) 05/02/2020   LDLCALC 55 05/02/2020   TRIG 42 05/02/2020   CHOLHDL 3.7 12/16/2016   Lab Results  Component Value Date   TSH 3.040 01/09/2019   Lab Results  Component Value Date   HGBA1C 5.5 05/12/2021   Lab Results  Component Value Date   WBC 6.2 02/16/2020   HGB 12.6 (L) 02/16/2020   HCT 37.1 (L) 02/16/2020   MCV 94.6 02/16/2020   PLT 157 02/16/2020   Lab Results  Component Value Date   ALT 34 06/09/2019   AST 26 06/09/2019   ALKPHOS 75 06/09/2019   BILITOT 0.5 06/09/2019   No results found for: 25OHVITD2, 25OHVITD3, VD25OH   Review of Systems  Constitutional:  Negative for chills, fatigue, fever and weight loss.  HENT:  Positive for rhinorrhea. Negative for drooling, ear discharge, ear pain, nosebleeds and sore throat.   Eyes:  Negative for blurred vision.  Respiratory:  Negative for cough, shortness of  breath and wheezing.   Cardiovascular:  Negative for chest pain, palpitations and leg swelling.  Gastrointestinal:  Negative for abdominal pain, blood in stool, constipation, diarrhea and nausea.  Endocrine: Negative for polydipsia, polyphagia and polyuria.  Genitourinary:  Negative for dysuria, frequency, hematuria and urgency.  Musculoskeletal:  Negative for back pain, myalgias and neck pain.  Skin:  Negative for rash.  Allergic/Immunologic: Negative for environmental allergies.  Neurological:  Negative for dizziness, weakness and headaches.  Hematological:  Does not bruise/bleed easily.  Psychiatric/Behavioral:  Negative for suicidal ideas. The patient is not nervous/anxious.    Patient Active Problem List   Diagnosis Date Noted   End stage renal disease ([place]) 06/04/2021   Anticoagulated 62/83/6629   Complication of vascular access for dialysis 06/06/2020   Fistula 02/23/2020   Polycystic liver disease 01/10/2020   Polycystic kidney disease 47/65/4650   Metabolic acidosis 35/46/5681   CKD stage 4 due to type 2 diabetes mellitus ([place]) 01/25/2018   Type 2 diabetes mellitus with diabetic nephropathy, without long-term current use of insulin ([place]) 01/25/2018   Secondary hyperparathyroidism of renal origin ([place]) 08/19/2017   Chronic atrial fibrillation ([place]) 01/06/2017   Essential hypertension 01/06/2017   Hyperuricemia 01/06/2017   Mixed hyperlipidemia 01/06/2017   Gastroesophageal reflux disease 01/06/2017   Obesity 12/26/2012    Allergies  Allergen Reactions   Ciprofloxacin Other (See Comments)    hallucination   Pollen Extract Other (  See Comments)    Sneezing, watery eyes, etc.   Caffeine Palpitations    palpatations    Past Surgical History:  Procedure Laterality Date   BASCILIC VEIN TRANSPOSITION Left 02/23/2020   Procedure: BASCILIC VEIN TRANSPOSITION (SINGLE STAGE);  Surgeon: Katha Cabal, MD;  Location: [facility] ORS;  Service: Vascular;  Laterality: Left;    COLONOSCOPY  2012   hemorrhoids   melanoma removal     forehead    Social History   Tobacco Use   Smoking status: Never   Smokeless tobacco: Never  Vaping Use   Vaping Use: Never used  Substance Use Topics   Alcohol use: No   Drug use: No     Medication list has been reviewed and updated.  Current Meds  Medication Sig   allopurinol (ZYLOPRIM) 100 MG tablet TAKE 1 TABLET DAILY   amLODipine (NORVASC) 10 MG tablet Take 1 tablet (10 mg total) by mouth daily.   apixaban (ELIQUIS) 2.5 MG TABS tablet Take 1 tablet (2.5 mg total) by mouth daily.   b complex vitamins tablet Take 1 tablet by mouth daily.   blood glucose meter kit and supplies Dispense based on patient and insurance preference. Use up to four times daily as directed. (FOR ICD-10 E11.9).   Cholecalciferol 50 MCG (2000 UT) CAPS Take 2,000 Units by mouth daily.    furosemide (LASIX) 20 MG tablet Take 20 mg by mouth daily.   Insulin Pen Needle (NOVOFINE PEN NEEDLE) 32G X 6 MM MISC USE AND DISCARD 1 PEN      NEEDLE SUBCUTANEOUSLY      DAILY AS DIRECTED   Lancets (ONETOUCH DELICA PLUS [id number]) MISC USE AS DIRECTED EVERY DAY   liraglutide (VICTOZA) 18 MG/3ML SOPN INJECT 1.8MG SUBCUTANEOUSLYONCE DAILY   loratadine (CLARITIN) 10 MG tablet Take 10 mg by mouth at bedtime. otc   lovastatin (MEVACOR) 20 MG tablet TAKE 1 TABLET DAILY   metoprolol succinate (TOPROL-XL) 100 MG 24 hr tablet TAKE 1 TABLET DAILY WITH ORIMMEDIATELY FOLLOWING A    MEAL   ONETOUCH ULTRA test strip ONE TEST DAILY   pantoprazole (PROTONIX) 40 MG tablet TAKE 1 TABLET DAILY   spironolactone (ALDACTONE) 25 MG tablet Take 25 mg by mouth daily.    PHQ 2/9 Scores 09/12/2021 05/12/2021 01/13/2021 01/08/2021  PHQ - 2 Score 0 0 0 0  PHQ- 9 Score 0 0 - 0    GAD 7 : Generalized Anxiety Score 09/12/2021 05/12/2021 01/08/2021 09/04/2020  Nervous, Anxious, on Edge 0 0 1 0  Control/stop worrying 0 0 1 0  Worry too much - different things 0 0 0 0  Trouble relaxing 0 0 0  0  Restless 0 0 0 0  Easily annoyed or irritable 0 0 0 0  Afraid - awful might happen 0 0 0 0  Total GAD 7 Score 0 0 2 0  Anxiety Difficulty Not difficult at all - Not difficult at all -    BP Readings from Last 3 Encounters:  09/12/21 (!) 122/56  06/05/21 118/75  05/12/21 130/80    Physical Exam Vitals and nursing note reviewed.  HENT:     Head: Normocephalic.     Right Ear: Tympanic membrane, ear canal and external ear normal.     Left Ear: Tympanic membrane, ear canal and external ear normal.     Nose: Nose normal. No congestion or rhinorrhea.  Eyes:     General: No scleral icterus.       Right eye: No  discharge.        Left eye: No discharge.     Conjunctiva/sclera: Conjunctivae normal.     Pupils: Pupils are equal, round, and reactive to light.  Neck:     Thyroid: No thyromegaly.     Vascular: No JVD.     Trachea: No tracheal deviation.  Cardiovascular:     Rate and Rhythm: Normal rate and regular rhythm.     Heart sounds: Normal heart sounds. No murmur heard.   No friction rub. No gallop.  Pulmonary:     Effort: No respiratory distress.     Breath sounds: Normal breath sounds. No wheezing or rales.  Abdominal:     General: Bowel sounds are normal.     Palpations: Abdomen is soft. There is no mass.     Tenderness: There is no abdominal tenderness. There is no guarding or rebound.  Musculoskeletal:        General: No tenderness. Normal range of motion.     Cervical back: Normal range of motion and neck supple.  Lymphadenopathy:     Cervical: No cervical adenopathy.  Skin:    General: Skin is warm.     Capillary Refill: Capillary refill takes less than 2 seconds.     Findings: Petechiae present. No rash.     Comments: Head/facial petichiae  Neurological:     Mental Status: He is alert and oriented to person, place, and time.     Cranial Nerves: No cranial nerve deficit.     Deep Tendon Reflexes: Reflexes are normal and symmetric.    Wt Readings from Last 3  Encounters:  09/12/21 228 lb (103.4 kg)  06/05/21 239 lb 6.4 oz (108.6 kg)  05/12/21 239 lb (108.4 kg)    BP (!) 122/56    Pulse 82    Ht 6' (1.829 m)    Wt 228 lb (103.4 kg)    BMI 30.92 kg/m   Assessment and Plan: 1. Type 2 diabetes mellitus with diabetic nephropathy, without long-term current use of insulin (HCC) Chronic.  Persistent.  Stable.  Controlled with last readings to be in the 100 range on fasting.  Patient continues Victoza 1.8 mg subcu daily.  We will check hemoglobin A1c and microalbuminuria. - liraglutide (VICTOZA) 18 MG/3ML SOPN; INJECT 1.8MG SUBCUTANEOUSLYONCE DAILY  Dispense: 27 mL; Refill: 1 - HgB A1c - Microalbumin, urine  2. Petechiae Earlier in the week around Monday patient had an episode of emesis.  This was after he had a large mucous discharge nasally.  Patient has felt well afterwards and has eaten cautiously.  It was noted that there were petechiae at the right head and facial area and we will check a CBC to evaluate particularly for platelet issues. - CBC with Differential/Platelet

## 2021-09-14 LAB — CBC WITH DIFFERENTIAL/PLATELET
Basophils Absolute: 0 10*3/uL (ref 0.0–0.2)
Basos: 1 %
EOS (ABSOLUTE): 0.1 10*3/uL (ref 0.0–0.4)
Eos: 2 %
Hematocrit: 30.8 % — ABNORMAL LOW (ref 37.5–51.0)
Hemoglobin: 10 g/dL — ABNORMAL LOW (ref 13.0–17.7)
Immature Grans (Abs): 0 10*3/uL (ref 0.0–0.1)
Immature Granulocytes: 0 %
Lymphocytes Absolute: 0.6 10*3/uL — ABNORMAL LOW (ref 0.7–3.1)
Lymphs: 10 %
MCH: 33.6 pg — ABNORMAL HIGH (ref 26.6–33.0)
MCHC: 32.5 g/dL (ref 31.5–35.7)
MCV: 103 fL — ABNORMAL HIGH (ref 79–97)
Monocytes Absolute: 0.6 10*3/uL (ref 0.1–0.9)
Monocytes: 10 %
Neutrophils Absolute: 4.5 10*3/uL (ref 1.4–7.0)
Neutrophils: 77 %
Platelets: 119 10*3/uL — ABNORMAL LOW (ref 150–450)
RBC: 2.98 x10E6/uL — ABNORMAL LOW (ref 4.14–5.80)
RDW: 14.1 % (ref 11.6–15.4)
WBC: 5.9 10*3/uL (ref 3.4–10.8)

## 2021-09-14 LAB — HEMOGLOBIN A1C
Est. average glucose Bld gHb Est-mCnc: 114 mg/dL
Hgb A1c MFr Bld: 5.6 % (ref 4.8–5.6)

## 2021-09-14 LAB — MICROALBUMIN, URINE: Microalbumin, Urine: 340.3 ug/mL

## 2021-09-15 ENCOUNTER — Other Ambulatory Visit: Payer: Self-pay

## 2021-09-15 ENCOUNTER — Other Ambulatory Visit (INDEPENDENT_AMBULATORY_CARE_PROVIDER_SITE_OTHER): Payer: Self-pay | Admitting: Vascular Surgery

## 2021-09-15 DIAGNOSIS — I482 Chronic atrial fibrillation, unspecified: Secondary | ICD-10-CM

## 2021-09-15 DIAGNOSIS — D649 Anemia, unspecified: Secondary | ICD-10-CM | POA: Diagnosis not present

## 2021-09-15 DIAGNOSIS — E1122 Type 2 diabetes mellitus with diabetic chronic kidney disease: Secondary | ICD-10-CM

## 2021-09-15 MED ORDER — APIXABAN 2.5 MG PO TABS: 2.5000 mg | ORAL_TABLET | Freq: Two times a day (BID) | ORAL | 11 refills | Status: DC

## 2021-09-15 NOTE — Progress Notes (Signed)
Patient is taking Eliquis for treating atrial fibrillation.  Given his renal insufficiency 2.5 mg twice daily is the appropriate dose.

## 2021-09-15 NOTE — Progress Notes (Signed)
Ordered Y61 and folic acid

## 2021-09-16 LAB — VITAMIN B12: Vitamin B-12: 1590 pg/mL — ABNORMAL HIGH (ref 232–1245)

## 2021-09-16 LAB — FOLATE: Folate: >20 ng/mL (ref >3.0)

## 2021-10-01 ENCOUNTER — Telehealth: Payer: Self-pay | Admitting: Family Medicine

## 2021-10-01 ENCOUNTER — Other Ambulatory Visit: Payer: Self-pay

## 2021-10-01 DIAGNOSIS — D631 Anemia in chronic kidney disease: Secondary | ICD-10-CM | POA: Diagnosis not present

## 2021-10-01 DIAGNOSIS — I10 Essential (primary) hypertension: Secondary | ICD-10-CM | POA: Diagnosis not present

## 2021-10-01 DIAGNOSIS — N2581 Secondary hyperparathyroidism of renal origin: Secondary | ICD-10-CM | POA: Diagnosis not present

## 2021-10-01 DIAGNOSIS — K219 Gastro-esophageal reflux disease without esophagitis: Secondary | ICD-10-CM

## 2021-10-01 DIAGNOSIS — N185 Chronic kidney disease, stage 5: Secondary | ICD-10-CM | POA: Diagnosis not present

## 2021-10-01 MED ORDER — PANTOPRAZOLE SODIUM 40 MG PO TBEC: 40.0000 mg | DELAYED_RELEASE_TABLET | Freq: Every day | ORAL | 0 refills | Status: DC

## 2021-10-01 NOTE — Telephone Encounter (Signed)
Called pt left VM that a refill was sent in. Pt name stated on VM. ? ?KP ?

## 2021-10-01 NOTE — Telephone Encounter (Signed)
Copied from [place] [id number]. Topic: Quick Communication - Rx Refill/Question ?>> Oct 01, 2021  8:20 AM Mark Bauer wrote: ?Medication: pantoprazole (PROTONIX) 40 MG tablet  ?Per patient Rx need to say DR so that insurance will cover  90 day supply  ?Has the patient contacted their pharmacy? Yes.  Need new Rx  ?(Agent: If no, request that the patient contact the pharmacy for the refill. If patient does not wish to contact the pharmacy document the reason why and proceed with request.) ?(Agent: If yes, when and what did the pharmacy advise?) ? ?Preferred Pharmacy (with phone number or street name): [id number] [place] ?Phone:  [phone]?Fax:  [phone]? ? ? ?Has the patient been seen for an appointment in the last year OR does the patient have an upcoming appointment? Yes.   ? ?Agent: Please be advised that RX refills may take up to 3 business days. We ask that you follow-up with your pharmacy. ?

## 2021-10-08 DIAGNOSIS — N184 Chronic kidney disease, stage 4 (severe): Secondary | ICD-10-CM | POA: Diagnosis not present

## 2021-10-08 DIAGNOSIS — I10 Essential (primary) hypertension: Secondary | ICD-10-CM | POA: Diagnosis not present

## 2021-10-08 DIAGNOSIS — M1A9XX0 Chronic gout, unspecified, without tophus (tophi): Secondary | ICD-10-CM | POA: Diagnosis not present

## 2021-10-08 DIAGNOSIS — N185 Chronic kidney disease, stage 5: Secondary | ICD-10-CM | POA: Diagnosis not present

## 2021-10-08 DIAGNOSIS — D631 Anemia in chronic kidney disease: Secondary | ICD-10-CM | POA: Diagnosis not present

## 2021-10-08 DIAGNOSIS — Z6830 Body mass index (BMI) 30.0-30.9, adult: Secondary | ICD-10-CM | POA: Diagnosis not present

## 2021-10-08 DIAGNOSIS — N2581 Secondary hyperparathyroidism of renal origin: Secondary | ICD-10-CM | POA: Diagnosis not present

## 2021-10-08 DIAGNOSIS — Q613 Polycystic kidney, unspecified: Secondary | ICD-10-CM | POA: Diagnosis not present

## 2021-11-17 ENCOUNTER — Other Ambulatory Visit: Payer: Self-pay | Admitting: Family Medicine

## 2021-11-17 DIAGNOSIS — I10 Essential (primary) hypertension: Secondary | ICD-10-CM

## 2021-11-17 NOTE — Telephone Encounter (Signed)
Medication Refill - Medication: metoprolol succinate (TOPROL-XL) 100 MG 24 hr tablet ? ?Has the patient contacted their pharmacy? Yes.   ?(He was told to call the dr office ? ?Preferred Pharmacy (with phone number or street name): [id number] [place]?Has the patient been seen for an appointment in the last year OR does the patient have an upcoming appointment? Yes.   ? ?Agent: Please be advised that RX refills may take up to 3 business days. We ask that you follow-up with your pharmacy. ? ?

## 2021-11-18 MED ORDER — METOPROLOL SUCCINATE ER 100 MG PO TB24: ORAL_TABLET | ORAL | 0 refills | Status: DC

## 2021-11-18 NOTE — Telephone Encounter (Signed)
Requested Prescriptions  ?Pending Prescriptions Disp Refills  ?? metoprolol succinate (TOPROL-XL) 100 MG 24 hr tablet 90 tablet 1  ?  Sig: Take with or immediately following a meal.  ?  ? Cardiovascular:  Beta Blockers Passed - 11/17/2021  5:22 PM  ?  ?  Passed - Last BP in normal range  ?  BP Readings from Last 1 Encounters:  ?09/12/21 (!) 122/56  ?   ?  ?  Passed - Last Heart Rate in normal range  ?  Pulse Readings from Last 1 Encounters:  ?09/12/21 82  ?   ?  ?  Passed - Valid encounter within last 6 months  ?  Recent Outpatient Visits   ?      ? 2 months ago Type 2 diabetes mellitus with diabetic nephropathy, without long-term current use of insulin ([place])  ? [facility] Juline Patch, MD  ? 6 months ago Chronic atrial fibrillation [facility])  ? [facility] Juline Patch, MD  ? 7 months ago Chronic atrial fibrillation [facility])  ? [facility] Juline Patch, MD  ? 10 months ago Type 2 diabetes mellitus without complication, without long-term current use of insulin ([place])  ? [facility] Juline Patch, MD  ? 1 year ago Type 2 diabetes mellitus with diabetic nephropathy, without long-term current use of insulin ([place])  ? [facility] Juline Patch, MD  ?  ?  ?Future Appointments   ?        ? In 1 month Juline Patch, MD [facility], [place]  ? In 1 month Juline Patch, MD [facility], [place]  ?  ? ?  ?  ?  ? ? ?

## 2021-12-16 ENCOUNTER — Other Ambulatory Visit: Payer: Self-pay | Admitting: Family Medicine

## 2021-12-16 DIAGNOSIS — E782 Mixed hyperlipidemia: Secondary | ICD-10-CM

## 2021-12-16 DIAGNOSIS — E790 Hyperuricemia without signs of inflammatory arthritis and tophaceous disease: Secondary | ICD-10-CM

## 2021-12-16 NOTE — Telephone Encounter (Signed)
Medication Refill - Medication:  lovastatin (MEVACOR) 20 MG tablet  allopurinol (ZYLOPRIM) 100 MG tablet  Has the patient contacted their pharmacy? Yes Contact PCP  Preferred Pharmacy (with phone number or street name):  [id number] [id number] Phone:  [phone] Fax:  [phone]  Has the patient been seen for an appointment in the last year OR does the patient have an upcoming appointment? Yes.    Agent: Please be advised that RX refills may take up to 3 business days. We ask that you follow-up with your pharmacy.

## 2021-12-17 LAB — HM DIABETES EYE EXAM

## 2021-12-17 MED ORDER — LOVASTATIN 20 MG PO TABS: 20.0000 mg | ORAL_TABLET | Freq: Every day | ORAL | 0 refills | Status: DC

## 2021-12-17 MED ORDER — ALLOPURINOL 100 MG PO TABS: 100.0000 mg | ORAL_TABLET | Freq: Every day | ORAL | 0 refills | Status: DC

## 2021-12-17 NOTE — Telephone Encounter (Signed)
Requested medication (s) are due for refill today: yes  Requested medication (s) are on the active medication list: yes  Last refill:  lovastatin: 03/21/21 #90 1 RF      allopurinol: 06/20/21 #90  Future visit scheduled: yes  Notes to clinic:  overdue lab work   Requested Prescriptions  Pending Prescriptions Disp Refills   lovastatin (MEVACOR) 20 MG tablet 90 tablet 1    Sig: Take 1 tablet (20 mg total) by mouth daily.     Cardiovascular:  Antilipid - Statins 2 Failed - 12/16/2021 11:45 AM      Failed - Cr in normal range and within 360 days    Creatinine, Ser  Date Value Ref Range Status  02/16/2020 6.46 (H) 0.61 - 1.24 mg/dL Final         Failed - Lipid Panel in normal range within the last 12 months    Cholesterol, Total  Date Value Ref Range Status  05/02/2020 100 100 - 199 mg/dL Final   LDL Chol Calc (NIH)  Date Value Ref Range Status  05/02/2020 55 0 - 99 mg/dL Final   HDL  Date Value Ref Range Status  05/02/2020 34 (L) >39 mg/dL Final   Triglycerides  Date Value Ref Range Status  05/02/2020 42 0 - 149 mg/dL Final         Passed - Patient is not pregnant      Passed - Valid encounter within last 12 months    Recent Outpatient Visits           3 months ago Type 2 diabetes mellitus with diabetic nephropathy, without long-term current use of insulin ([place])   [facility]   7 months ago Chronic atrial fibrillation ([place])   [facility]ine [facility], MD   8 months ago Chronic atrial fibrillation [facility])   [facility] Juline Patch, MD   11 months ago Type 2 diabetes mellitus without complication, without long-term current use of insulin ([place])   [facility], MD   1 year ago Type 2 diabetes mellitus with diabetic nephropathy, without long-term current use of insulin ([place])   [facility] Jul[facility], MD       Future Appointments             In 3 weeks Juline Patch, MD  [facility]   In 3 weeks Juline Patch, MD [facility]              allopurinol (ZYLOPRIM) 100 MG tablet 90 tablet 0    Sig: Take 1 tablet (100 mg total) by mouth daily.     Endocrinology:  Gout Agents - allopurinol Failed - 12/16/2021 11:45 AM      Failed - Uric Acid in normal range and within 360 days    No results found for: POCURA, LABURIC       Failed - Cr in normal range and within 360 days    Creatinine, Ser  Date Value Ref Range Status  02/16/2020 6.46 (H) 0.61 - 1.24 mg/dL Final         Passed - Valid encounter within last 12 months    Recent Outpatient Visits           3 months ago Type 2 diabetes mellitus with diabetic nephropathy, without long-term current use of insulin ([place])   [facility], MD   7  months ago Chronic atrial fibrillation ([place])   [facility], MD   8 months ago Chronic atrial fibrillation [facility])   [facility] Juline Patch, MD   11 months ago Type 2 diabetes mellitus without complication, without long-term current use of insulin ([place])   [facility] Juline Patch, MD   1 year ago Type 2 diabetes mellitus with diabetic nephropathy, without long-term current use of insulin ([place])   [facility] Juline Patch, MD       Future Appointments             In 3 weeks Juline Patch, MD [facility]   In 3 weeks Juline Patch, MD [facility]             Passed - CBC within normal limits and completed in the last 12 months    WBC  Date Value Ref Range Status  09/12/2021 5.9 3.4 - 10.8 x10E3/uL Final  02/16/2020 6.2 4.0 - 10.5 K/uL Final   RBC  Date Value Ref Range Status  09/12/2021 2.98 (L) 4.14 - 5.80 x10E6/uL Final  02/16/2020 3.92 (L) 4.22 - 5.81 MIL/uL Final   Hemoglobin  Date Value Ref Range Status  09/12/2021 10.0 (L) 13.0 - 17.7 g/dL Final   Hematocrit  Date Value Ref Range Status   09/12/2021 30.8 (L) 37.5 - 51.0 % Final   MCHC  Date Value Ref Range Status  09/12/2021 32.5 31.5 - 35.7 g/dL Final  02/16/2020 34.0 30.0 - 36.0 g/dL Final   [facility]  Date Value Ref Range Status  09/12/2021 33.6 (H) 26.6 - 33.0 pg Final  02/16/2020 32.1 26.0 - 34.0 pg Final   MCV  Date Value Ref Range Status  09/12/2021 103 (H) 79 - 97 fL Final   No results found for: PLTCOUNTKUC, LABPLAT, POCPLA RDW  Date Value Ref Range Status  09/12/2021 14.1 11.6 - 15.4 % Final

## 2021-12-23 DIAGNOSIS — H1131 Conjunctival hemorrhage, right eye: Secondary | ICD-10-CM | POA: Diagnosis not present

## 2022-01-03 ENCOUNTER — Other Ambulatory Visit: Payer: Self-pay | Admitting: Family Medicine

## 2022-01-03 DIAGNOSIS — K219 Gastro-esophageal reflux disease without esophagitis: Secondary | ICD-10-CM

## 2022-01-05 NOTE — Telephone Encounter (Signed)
Requested Prescriptions  Pending Prescriptions Disp Refills  . pantoprazole (PROTONIX) 40 MG tablet [Pharmacy Med Name: PANTOPRAZOLE SOD DR 40 MG TAB] 90 tablet 0    Sig: TAKE 1 TABLET BY MOUTH EVERY DAY     Gastroenterology: Proton Pump Inhibitors Passed - 01/03/2022  9:05 AM      Passed - Valid encounter within last 12 months    Recent Outpatient Visits          3 months ago Type 2 diabetes mellitus with diabetic nephropathy, without long-term current use of insulin ([place])   [facility], MD   7 months ago Chronic atrial fibrillation [facility])   [facility], MD   8 months ago Chronic atrial fibrillation [facility])   [facility] Jul[facility], MD   12 months ago Type 2 diabetes mellitus without complication, without long-term current use of insulin ([place])   [facility]   1 year ago Type 2 diabetes mellitus with diabetic nephropathy, without long-term current use of insulin ([place])   [facility] Juline Patch, MD      Future Appointments            In 4 days Juline Patch, MD [facility], [place]   In 1 week Juline Patch, MD [facility], [facility]

## 2022-01-09 ENCOUNTER — Ambulatory Visit (INDEPENDENT_AMBULATORY_CARE_PROVIDER_SITE_OTHER): Payer: Medicare Other | Admitting: Family Medicine

## 2022-01-09 ENCOUNTER — Encounter: Payer: Self-pay | Admitting: Family Medicine

## 2022-01-09 VITALS — BP 130/78 | HR 70 | Ht 72.0 in | Wt 224.0 lb

## 2022-01-09 DIAGNOSIS — I10 Essential (primary) hypertension: Secondary | ICD-10-CM | POA: Diagnosis not present

## 2022-01-09 DIAGNOSIS — E1121 Type 2 diabetes mellitus with diabetic nephropathy: Secondary | ICD-10-CM | POA: Diagnosis not present

## 2022-01-09 DIAGNOSIS — E782 Mixed hyperlipidemia: Secondary | ICD-10-CM | POA: Diagnosis not present

## 2022-01-09 DIAGNOSIS — K219 Gastro-esophageal reflux disease without esophagitis: Secondary | ICD-10-CM

## 2022-01-09 DIAGNOSIS — E790 Hyperuricemia without signs of inflammatory arthritis and tophaceous disease: Secondary | ICD-10-CM

## 2022-01-09 MED ORDER — LOVASTATIN 20 MG PO TABS: 20.0000 mg | ORAL_TABLET | Freq: Every day | ORAL | 1 refills | Status: DC

## 2022-01-09 MED ORDER — AMLODIPINE BESYLATE 10 MG PO TABS: 10.0000 mg | ORAL_TABLET | Freq: Every day | ORAL | 1 refills | Status: DC

## 2022-01-09 MED ORDER — PANTOPRAZOLE SODIUM 40 MG PO TBEC: 40.0000 mg | DELAYED_RELEASE_TABLET | Freq: Every day | ORAL | 1 refills | Status: DC

## 2022-01-09 MED ORDER — ALLOPURINOL 100 MG PO TABS: 100.0000 mg | ORAL_TABLET | Freq: Every day | ORAL | 0 refills | Status: DC

## 2022-01-09 MED ORDER — VICTOZA 18 MG/3ML ~~LOC~~ SOPN: PEN_INJECTOR | SUBCUTANEOUS | 1 refills | Status: DC

## 2022-01-09 MED ORDER — METOPROLOL SUCCINATE ER 100 MG PO TB24: ORAL_TABLET | ORAL | 1 refills | Status: DC

## 2022-01-09 NOTE — Progress Notes (Signed)
Date:  01/09/2022   Name:  Mark Bauer   DOB:  [birth year]   MRN:  [id number]   Chief Complaint: Gastroesophageal Reflux, Hypertension, Hyperlipidemia, Diabetes, and Gout  Gastroesophageal Reflux He reports no abdominal pain, no chest pain, no coughing, no dysphagia, no heartburn, no nausea, no sore throat or no wheezing. This is a chronic problem. The current episode started more than 1 year ago. The problem has been gradually improving. The symptoms are aggravated by certain foods. Pertinent negatives include no fatigue, melena, muscle weakness, orthopnea or weight loss. He has tried a PPI for the symptoms. The treatment provided moderate relief.  Hypertension This is a chronic problem. The current episode started more than 1 year ago. The problem has been gradually improving since onset. The problem is controlled. Pertinent negatives include no blurred vision, chest pain, headaches, neck pain, orthopnea, palpitations, PND or shortness of breath. Risk factors for coronary artery disease include dyslipidemia, diabetes mellitus and obesity. Past treatments include beta blockers and calcium channel blockers. The current treatment provides moderate improvement. There are no compliance problems.  There is no history of angina, kidney disease, CVA, heart failure, PVD or retinopathy. There is no history of chronic renal disease, a hypertension causing med or renovascular disease.  Hyperlipidemia This is a chronic problem. The current episode started more than 1 year ago. The problem is controlled. Recent lipid tests were reviewed and are normal. Exacerbating diseases include diabetes and obesity. He has no history of chronic renal disease. Pertinent negatives include no chest pain, focal weakness, myalgias or shortness of breath. Current antihyperlipidemic treatment includes statins. The current treatment provides moderate improvement of lipids. There are no compliance problems.   Diabetes He presents  for his follow-up diabetic visit. He has type 2 diabetes mellitus. His disease course has been stable. Pertinent negatives for hypoglycemia include no dizziness, headaches or nervousness/anxiousness. Pertinent negatives for diabetes include no blurred vision, no chest pain, no fatigue, no foot ulcerations, no polydipsia, no polyuria, no visual change and no weight loss. There are no hypoglycemic complications. Symptoms are improving. There are no diabetic complications. Pertinent negatives for diabetic complications include no CVA, peripheral neuropathy, PVD or retinopathy. Current diabetic treatments: victoza. His weight is stable. He is following a generally healthy diet. Meal planning includes carbohydrate counting and avoidance of concentrated sweets. He participates in exercise daily. His home blood glucose trend is fluctuating minimally. His breakfast blood glucose is taken between 8-9 am. His breakfast blood glucose range is generally 90-110 mg/dl.    Lab Results  Component Value Date   NA 138 02/16/2020   K 4.7 02/23/2020   CO2 21 (L) 02/16/2020   GLUCOSE 109 (H) 02/16/2020   BUN 78 (H) 02/16/2020   CREATININE 6.46 (H) 02/16/2020   CALCIUM 9.6 02/16/2020   GFRNONAA 8 (L) 02/16/2020   Lab Results  Component Value Date   CHOL 100 05/02/2020   HDL 34 (L) 05/02/2020   LDLCALC 55 05/02/2020   TRIG 42 05/02/2020   CHOLHDL 3.7 12/16/2016   Lab Results  Component Value Date   TSH 3.040 01/09/2019   Lab Results  Component Value Date   HGBA1C 5.6 09/12/2021   Lab Results  Component Value Date   WBC 5.9 09/12/2021   HGB 10.0 (L) 09/12/2021   HCT 30.8 (L) 09/12/2021   MCV 103 (H) 09/12/2021   PLT 119 (L) 09/12/2021   Lab Results  Component Value Date   ALT 34 06/09/2019   AST  26 06/09/2019   ALKPHOS 75 06/09/2019   BILITOT 0.5 06/09/2019   No results found for: "25OHVITD2", "25OHVITD3", "VD25OH"   Review of Systems  Constitutional:  Negative for chills, fatigue, fever and  weight loss.  HENT:  Negative for drooling, ear discharge, ear pain and sore throat.   Eyes:  Negative for blurred vision.  Respiratory:  Negative for cough, shortness of breath and wheezing.   Cardiovascular:  Negative for chest pain, palpitations, orthopnea, leg swelling and PND.  Gastrointestinal:  Negative for abdominal pain, blood in stool, constipation, diarrhea, dysphagia, heartburn, melena and nausea.  Endocrine: Negative for polydipsia and polyuria.  Genitourinary:  Negative for dysuria, frequency, hematuria and urgency.  Musculoskeletal:  Negative for back pain, myalgias, muscle weakness and neck pain.  Skin:  Negative for rash.  Allergic/Immunologic: Negative for environmental allergies.  Neurological:  Negative for dizziness, focal weakness and headaches.  Hematological:  Does not bruise/bleed easily.  Psychiatric/Behavioral:  Negative for suicidal ideas. The patient is not nervous/anxious.     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   Caffeine Palpitations    palpatations    Past Surgical History:   Procedure Laterality Date   BASCILIC VEIN TRANSPOSITION Left 02/23/2020   Procedure: BASCILIC VEIN TRANSPOSITION (SINGLE STAGE);  Surgeon: Renford Dills, MD;  Location: [facility] ORS;  Service: Vascular;  Laterality: Left;   COLONOSCOPY  2012   hemorrhoids   melanoma removal     forehead    Social History   Tobacco Use   Smoking status: Never   Smokeless tobacco: Never  Vaping Use   Vaping Use: Never used  Substance Use Topics   Alcohol use: No   Drug use: No     Medication list has been reviewed and updated.  Current Meds  Medication Sig   allopurinol (ZYLOPRIM) 100 MG tablet Take 1 tablet (100 mg total) by mouth daily.   amLODipine (NORVASC) 10 MG tablet Take 1 tablet (10 mg total) by mouth daily.   apixaban (ELIQUIS) 2.5 MG TABS tablet Take 1 tablet (2.5 mg total) by mouth 2 (two) times daily. (Patient taking differently: Take 2.5 mg by mouth daily.)   b complex vitamins tablet Take 1 tablet by mouth daily.   blood glucose meter kit and supplies Dispense based on patient and insurance preference. Use up to four times daily as directed. (FOR ICD-10 E11.9).   Cholecalciferol 50 MCG (2000 UT) CAPS Take 2,000 Units by mouth daily.    FERROUS GLUCONATE IRON PO Take by mouth.   furosemide (LASIX) 20 MG tablet Take 20 mg by mouth daily.   Insulin Pen Needle (NOVOFINE PEN NEEDLE) 32G X  6 MM MISC USE AND DISCARD 1 PEN      NEEDLE SUBCUTANEOUSLY      DAILY AS DIRECTED   Lancets (ONETOUCH DELICA PLUS LANCET30G) MISC USE AS DIRECTED EVERY DAY   liraglutide (VICTOZA) 18 MG/3ML SOPN INJECT 1.8MG  SUBCUTANEOUSLYONCE DAILY   loratadine (CLARITIN) 10 MG tablet Take 10 mg by mouth at bedtime. otc   lovastatin (MEVACOR) 20 MG tablet Take 1 tablet (20 mg total) by mouth daily.   metoprolol succinate (TOPROL-XL) 100 MG 24 hr tablet TAKE 1 TABLET DAILY WITH OR IMMEDIATELY FOLLOWING A MEAL   ONETOUCH ULTRA test strip ONE TEST DAILY   pantoprazole (PROTONIX) 40 MG tablet TAKE 1 TABLET BY MOUTH  EVERY DAY   spironolactone (ALDACTONE) 25 MG tablet Take 25 mg by mouth daily.       01/09/2022   10:18 AM 09/12/2021   10:00 AM 05/12/2021    9:18 AM 01/08/2021    9:27 AM  GAD 7 : Generalized Anxiety Score  Nervous, Anxious, on Edge 0 0 0 1  Control/stop worrying 0 0 0 1  Worry too much - different things 0 0 0 0  Trouble relaxing 0 0 0 0  Restless 0 0 0 0  Easily annoyed or irritable 0 0 0 0  Afraid - awful might happen 0 0 0 0  Total GAD 7 Score 0 0 0 2  Anxiety Difficulty Not difficult at all Not difficult at all  Not difficult at all       01/09/2022   10:18 AM  Depression screen PHQ 2/9  Decreased Interest 1  Down, Depressed, Hopeless 1  PHQ - 2 Score 2  Altered sleeping 1  Tired, decreased energy 0  Change in appetite 0  Feeling bad or failure about yourself  0  Trouble concentrating 0  Moving slowly or fidgety/restless 0  Suicidal thoughts 0  PHQ-9 Score 3  Difficult doing work/chores Not difficult at all    BP Readings from Last 3 Encounters:  01/09/22 (!) 144/88  09/12/21 (!) 122/56  06/05/21 118/75    Physical Exam Vitals and nursing note reviewed.  HENT:     Head: Normocephalic.     Right Ear: External ear normal.     Left Ear: External ear normal.     Nose: Nose normal.  Eyes:     General: No scleral icterus.       Right eye: No discharge.        Left eye: No discharge.     Conjunctiva/sclera: Conjunctivae normal.     Pupils: Pupils are equal, round, and reactive to light.  Neck:     Thyroid: No thyromegaly.     Vascular: No JVD.     Trachea: No tracheal deviation.  Cardiovascular:     Rate and Rhythm: Normal rate and regular rhythm.     Heart sounds: Normal heart sounds, S1 normal and S2 normal. No murmur heard.    No systolic murmur is present.     No diastolic murmur is present.     No friction rub. No gallop. No S3 or S4 sounds.  Pulmonary:     Effort: No respiratory distress.     Breath sounds: Normal breath sounds. No wheezing or  rales.  Abdominal:     General: Bowel sounds are normal.     Palpations: Abdomen is soft. There is no mass.     Tenderness: There is no abdominal tenderness. There is no guarding or rebound.  Musculoskeletal:  General: No tenderness. Normal range of motion.     Cervical back: Normal range of motion and neck supple.  Lymphadenopathy:     Cervical: No cervical adenopathy.  Skin:    General: Skin is warm.     Findings: No rash.  Neurological:     Mental Status: He is alert.     Cranial Nerves: No cranial nerve deficit.     Wt Readings from Last 3 Encounters:  01/09/22 224 lb (101.6 kg)  09/12/21 228 lb (103.4 kg)  06/05/21 239 lb 6.4 oz (108.6 kg)    BP (!) 144/88   Pulse 70   Ht 6' (1.829 m)   Wt 224 lb (101.6 kg)   BMI 30.38 kg/m   Assessment and Plan:   1. Type 2 diabetes mellitus with diabetic nephropathy, without long-term current use of insulin (HCC) Chronic.  Controlled.  Stable.  Patient tolerating medications well and will continue for [place] 1.8 mg subcutaneous once a day.  We will check A1c and CMP for current status of control. - liraglutide (VICTOZA) 18 MG/3ML SOPN; INJECT 1.8MG  SUBCUTANEOUSLYONCE DAILY  Dispense: 27 mL; Refill: 1 - HgB A1c - Comprehensive Metabolic Panel (CMET)  2. Essential hypertension Chronic.  Controlled.  Stable.  Blood pressure today is 130/78.  Continue metoprolol XL 100 mg once a day and amlodipine 10 mg once a day. - amLODipine (NORVASC) 10 MG tablet; Take 1 tablet (10 mg total) by mouth daily.  Dispense: 90 tablet; Refill: 1 - metoprolol succinate (TOPROL-XL) 100 MG 24 hr tablet; TAKE 1 TABLET DAILY WITH OR IMMEDIATELY FOLLOWING A MEAL  Dispense: 90 tablet; Refill: 1 - Comprehensive Metabolic Panel (CMET)  3. Hyperuricemia Chronic.  Controlled.  Stable.  Continue allopurinol 100 mg once a day. - allopurinol (ZYLOPRIM) 100 MG tablet; Take 1 tablet (100 mg total) by mouth daily.  Dispense: 90 tablet; Refill: 0  4. Mixed  hyperlipidemia Chronic.  Controlled.  Stable.  Continue lovastatin 20 mg once a day. - lovastatin (MEVACOR) 20 MG tablet; Take 1 tablet (20 mg total) by mouth daily.  Dispense: 90 tablet; Refill: 1  5. Gastroesophageal reflux disease, unspecified whether esophagitis present Chronic.  Controlled.  Stable.  No breakthrough acid reflux nor dysphagia.  We will continue pantoprazole 40 mg once a day. - pantoprazole (PROTONIX) 40 MG tablet; Take 1 tablet (40 mg total) by mouth daily.  Dispense: 90 tablet; Refill: 1

## 2022-01-10 LAB — COMPREHENSIVE METABOLIC PANEL
ALT: 19 IU/L (ref 0–44)
AST: 18 IU/L (ref 0–40)
Albumin/Globulin Ratio: 2.2 (ref 1.2–2.2)
Albumin: 4.9 g/dL — ABNORMAL HIGH (ref 3.8–4.8)
Alkaline Phosphatase: 62 IU/L (ref 44–121)
BUN/Creatinine Ratio: 12 (ref 10–24)
BUN: 94 mg/dL (ref 8–27)
Bilirubin Total: 0.6 mg/dL (ref 0.0–1.2)
CO2: 16 mmol/L — ABNORMAL LOW (ref 20–29)
Calcium: 10.6 mg/dL — ABNORMAL HIGH (ref 8.6–10.2)
Chloride: 103 mmol/L (ref 96–106)
Creatinine, Ser: 8.17 mg/dL — ABNORMAL HIGH (ref 0.76–1.27)
Globulin, Total: 2.2 g/dL (ref 1.5–4.5)
Glucose: 95 mg/dL (ref 70–99)
Potassium: 4.9 mmol/L (ref 3.5–5.2)
Sodium: 141 mmol/L (ref 134–144)
Total Protein: 7.1 g/dL (ref 6.0–8.5)
eGFR: 7 mL/min/{1.73_m2} — ABNORMAL LOW (ref >59)

## 2022-01-10 LAB — HEMOGLOBIN A1C
Est. average glucose Bld gHb Est-mCnc: 111 mg/dL
Hgb A1c MFr Bld: 5.5 % (ref 4.8–5.6)

## 2022-01-12 ENCOUNTER — Ambulatory Visit: Payer: BLUE CROSS/BLUE SHIELD | Admitting: Family Medicine

## 2022-01-14 ENCOUNTER — Ambulatory Visit (INDEPENDENT_AMBULATORY_CARE_PROVIDER_SITE_OTHER): Payer: Medicare Other

## 2022-01-14 DIAGNOSIS — Z0000 Encounter for general adult medical examination without abnormal findings: Secondary | ICD-10-CM | POA: Diagnosis not present

## 2022-01-14 NOTE — Patient Instructions (Signed)
Mark Bauer , Thank you for taking time to come for your Medicare Wellness Visit. I appreciate your ongoing commitment to your health goals. Please review the following plan we discussed and let me know if I can assist you in the future.   Screening recommendations/referrals: Colonoscopy: done 2012. FOBT done 04/16/21 Recommended yearly ophthalmology/optometry visit for glaucoma screening and checkup Recommended yearly dental visit for hygiene and checkup  Vaccinations: Influenza vaccine: done 04/17/21 Pneumococcal vaccine: done 06/09/19 Tdap vaccine: done 07/26/17 Shingles vaccine: Shingrix discussed. Please contact your pharmacy for coverage information.  Covid-19: done 09/15/19, 10/11/19, 07/04/20 & 04/17/21  Advanced directives: Please bring a copy of your health care power of attorney and living will to the office at your convenience.   Conditions/risks identified: Keep up the great work!  Next appointment: Follow up in one year for your annual wellness visit.   Preventive Care 68 Years and Older, Male Preventive care refers to lifestyle choices and visits with your health care provider that can promote health and wellness. What does preventive care include? A yearly physical exam. This is also called an annual well check. Dental exams once or twice a year. Routine eye exams. Ask your health care provider how often you should have your eyes checked. Personal lifestyle choices, including: Daily care of your teeth and gums. Regular physical activity. Eating a healthy diet. Avoiding tobacco and drug use. Limiting alcohol use. Practicing safe sex. Taking low doses of aspirin every day. Taking vitamin and mineral supplements as recommended by your health care provider. What happens during an annual well check? The services and screenings done by your health care provider during your annual well check will depend on your age, overall health, lifestyle risk factors, and family history of  disease. Counseling  Your health care provider may ask you questions about your: Alcohol use. Tobacco use. Drug use. Emotional well-being. Home and relationship well-being. Sexual activity. Eating habits. History of falls. Memory and ability to understand (cognition). Work and work Statistician. Screening  You may have the following tests or measurements: Height, weight, and BMI. Blood pressure. Lipid and cholesterol levels. These may be checked every 5 years, or more frequently if you are over 59 years old. Skin check. Lung cancer screening. You may have this screening every year starting at age 75 if you have a 30-pack-year history of smoking and currently smoke or have quit within the past 15 years. Fecal occult blood test (FOBT) of the stool. You may have this test every year starting at age 25. Flexible sigmoidoscopy or colonoscopy. You may have a sigmoidoscopy every 5 years or a colonoscopy every 10 years starting at age 10. Prostate cancer screening. Recommendations will vary depending on your family history and other risks. Hepatitis C blood test. Hepatitis B blood test. Sexually transmitted disease (STD) testing. Diabetes screening. This is done by checking your blood sugar (glucose) after you have not eaten for a while (fasting). You may have this done every 1-3 years. Abdominal aortic aneurysm (AAA) screening. You may need this if you are a current or former smoker. Osteoporosis. You may be screened starting at age 1 if you are at high risk. Talk with your health care provider about your test results, treatment options, and if necessary, the need for more tests. Vaccines  Your health care provider may recommend certain vaccines, such as: Influenza vaccine. This is recommended every year. Tetanus, diphtheria, and acellular pertussis (Tdap, Td) vaccine. You may need a Td booster every 10 years. Zoster  vaccine. You may need this after age 57. Pneumococcal 13-valent  conjugate (PCV13) vaccine. One dose is recommended after age 61. Pneumococcal polysaccharide (PPSV23) vaccine. One dose is recommended after age 83. Talk to your health care provider about which screenings and vaccines you need and how often you need them. This information is not intended to replace advice given to you by your health care provider. Make sure you discuss any questions you have with your health care provider. Document Released: 08/02/2015 Document Revised: 03/25/2016 Document Reviewed: 05/07/2015 Elsevier Interactive Patient Education  2017 [place] Prevention in the Home Falls can cause injuries. They can happen to people of all ages. There are many things you can do to make your home safe and to help prevent falls. What can I do on the outside of my home? Regularly fix the edges of walkways and driveways and fix any cracks. Remove anything that might make you trip as you walk through a door, such as a raised step or threshold. Trim any bushes or trees on the path to your home. Use bright outdoor lighting. Clear any walking paths of anything that might make someone trip, such as rocks or tools. Regularly check to see if handrails are loose or broken. Make sure that both sides of any steps have handrails. Any raised decks and porches should have guardrails on the edges. Have any leaves, snow, or ice cleared regularly. Use sand or salt on walking paths during winter. Clean up any spills in your garage right away. This includes oil or grease spills. What can I do in the bathroom? Use night lights. Install grab bars by the toilet and in the tub and shower. Do not use towel bars as grab bars. Use non-skid mats or decals in the tub or shower. If you need to sit down in the shower, use a plastic, non-slip stool. Keep the floor dry. Clean up any water that spills on the floor as soon as it happens. Remove soap buildup in the tub or shower regularly. Attach bath mats  securely with double-sided non-slip rug tape. Do not have throw rugs and other things on the floor that can make you trip. What can I do in the bedroom? Use night lights. Make sure that you have a light by your bed that is easy to reach. Do not use any sheets or blankets that are too big for your bed. They should not hang down onto the floor. Have a firm chair that has side arms. You can use this for support while you get dressed. Do not have throw rugs and other things on the floor that can make you trip. What can I do in the kitchen? Clean up any spills right away. Avoid walking on wet floors. Keep items that you use a lot in easy-to-reach places. If you need to reach something above you, use a strong step stool that has a grab bar. Keep electrical cords out of the way. Do not use floor polish or wax that makes floors slippery. If you must use wax, use non-skid floor wax. Do not have throw rugs and other things on the floor that can make you trip. What can I do with my stairs? Do not leave any items on the stairs. Make sure that there are handrails on both sides of the stairs and use them. Fix handrails that are broken or loose. Make sure that handrails are as long as the stairways. Check any carpeting to make sure that it  is firmly attached to the stairs. Fix any carpet that is loose or worn. Avoid having throw rugs at the top or bottom of the stairs. If you do have throw rugs, attach them to the floor with carpet tape. Make sure that you have a light switch at the top of the stairs and the bottom of the stairs. If you do not have them, ask someone to add them for you. What else can I do to help prevent falls? Wear shoes that: Do not have high heels. Have rubber bottoms. Are comfortable and fit you well. Are closed at the toe. Do not wear sandals. If you use a stepladder: Make sure that it is fully opened. Do not climb a closed stepladder. Make sure that both sides of the stepladder  are locked into place. Ask someone to hold it for you, if possible. Clearly mark and make sure that you can see: Any grab bars or handrails. First and last steps. Where the edge of each step is. Use tools that help you move around (mobility aids) if they are needed. These include: Canes. Walkers. Scooters. Crutches. Turn on the lights when you go into a dark area. Replace any light bulbs as soon as they burn out. Set up your furniture so you have a clear path. Avoid moving your furniture around. If any of your floors are uneven, fix them. If there are any pets around you, be aware of where they are. Review your medicines with your doctor. Some medicines can make you feel dizzy. This can increase your chance of falling. Ask your doctor what other things that you can do to help prevent falls. This information is not intended to replace advice given to you by your health care provider. Make sure you discuss any questions you have with your health care provider. Document Released: 05/02/2009 Document Revised: 12/12/2015 Document Reviewed: 08/10/2014 Elsevier Interactive Patient Education  2017 [employer].

## 2022-01-14 NOTE — Progress Notes (Signed)
Subjective:   Mark Bauer is a 66 y.o. male who presents for Medicare Annual/Subsequent preventive examination.  Virtual Visit via Telephone Note  I connected with  Mark Bauer on 01/14/22 at  2:00 PM EDT by telephone and verified that I am speaking with the correct person using two identifiers.  Location: Patient: home Provider: [facility] Persons participating in the virtual visit: [place]   I discussed the limitations, risks, security and privacy concerns of performing an evaluation and management service by telephone and the availability of in person appointments. The patient expressed understanding and agreed to proceed.  Interactive audio and video telecommunications were attempted between this nurse and patient, however failed, due to patient having technical difficulties OR patient did not have access to video capability.  We continued and completed visit with audio only.  Some vital signs may be absent or patient reported.   Mark Marker, LPN   Review of Systems     Cardiac Risk Factors include: advanced age (>55mn, >>56women);diabetes mellitus;dyslipidemia;male gender;hypertension;obesity (BMI >30kg/m2)     Objective:    There were no vitals filed for this visit. There is no height or weight on file to calculate BMI.     01/14/2022    2:10 PM 01/13/2021    2:06 PM 02/16/2020    1:57 PM 01/10/2020    2:09 PM 07/18/2015    9:40 AM  Advanced Directives  Does Patient Have a Medical Advance Directive? _0   Would patient like information on creating a medical advance directive? Yes (MAU/Ambulatory/Procedural Areas - Information given) No - Patient declined No - Patient declined Yes (MAU/Ambulatory/Procedural Areas - Information given)     Current Medications (verified) Outpatient Encounter Medications as of 01/14/2022  Medication Sig   allopurinol (ZYLOPRIM) 100 MG tablet Take 1 tablet (100 mg total) by mouth daily.   amLODipine  (NORVASC) 10 MG tablet Take 1 tablet (10 mg total) by mouth daily.   apixaban (ELIQUIS) 2.5 MG TABS tablet Take 1 tablet (2.5 mg total) by mouth 2 (two) times daily.   b complex vitamins tablet Take 1 tablet by mouth daily.   blood glucose meter kit and supplies Dispense based on patient and insurance preference. Use up to four times daily as directed. (FOR ICD-10 E11.9).   Cholecalciferol 50 MCG (2000 UT) CAPS Take 2,000 Units by mouth daily.    FERROUS GLUCONATE IRON PO Take by mouth.   furosemide (LASIX) 20 MG tablet Take 20 mg by mouth daily.   Insulin Pen Needle (NOVOFINE PEN NEEDLE) 32G X 6 MM MISC USE AND DISCARD 1 PEN      NEEDLE SUBCUTANEOUSLY      DAILY AS DIRECTED   Lancets (ONETOUCH DELICA PLUS [id number] MISC USE AS DIRECTED EVERY DAY   liraglutide (VICTOZA) 18 MG/3ML SOPN INJECT 1.8MG SUBCUTANEOUSLYONCE DAILY   loratadine (CLARITIN) 10 MG tablet Take 10 mg by mouth at bedtime. otc   lovastatin (MEVACOR) 20 MG tablet Take 1 tablet (20 mg total) by mouth daily.   metoprolol succinate (TOPROL-XL) 100 MG 24 hr tablet TAKE 1 TABLET DAILY WITH OR IMMEDIATELY FOLLOWING A MEAL   ONETOUCH ULTRA test strip ONE TEST DAILY   pantoprazole (PROTONIX) 40 MG tablet Take 1 tablet (40 mg total) by mouth daily.   spironolactone (ALDACTONE) 25 MG tablet Take 25 mg by mouth daily.   No facility-administered encounter medications on file as of 01/14/2022.    Allergies (verified) Ciprofloxacin, Pollen extract, and Caffeine  History: Past Medical History:  Diagnosis Date   Allergy    Bilateral undescended testicles    Cancer ([place])    melanoma on forehead   Cataract    OU   Diabetes mellitus without complication ([place])    Dysrhythmia    a fib   GERD (gastroesophageal reflux disease)    Gout    Hepatitis    polycystic    Hypertension    Hypokalemia    Mixed hyperlipidemia    Polycystic kidney    Vitamin D deficiency    Past Surgical History:  Procedure Laterality Date   BASCILIC  VEIN TRANSPOSITION Left 02/23/2020   Procedure: BASCILIC VEIN TRANSPOSITION (SINGLE STAGE);  Surgeon: Katha Cabal, MD;  Location: [facility] ORS;  Service: Vascular;  Laterality: Left;   COLONOSCOPY  2012   hemorrhoids   melanoma removal     forehead   Family History  Problem Relation Age of Onset   Diabetes Mother    Heart disease Father    Social History   Socioeconomic History   Marital status: Single    Spouse name: Not on file   Number of children: 0   Years of education: Not on file   Highest education level: Associate degree: occupational, Hotel manager, or vocational program  Occupational History   Occupation: retired  Tobacco Use   Smoking status: Never   Smokeless tobacco: Never  Vaping Use   Vaping Use: Never used  Substance and Sexual Activity   Alcohol use: No   Drug use: No   Sexual activity: Never  Other Topics Concern   Not on file  Social History Narrative   Never married, no children, lives alone   Social Determinants of Health   Financial Resource Strain: Low Risk  (01/14/2022)   Overall Financial Resource Strain (CARDIA)    Difficulty of Paying Living Expenses: Not hard at all  Food Insecurity: No [place] (01/14/2022)   Hunger Vital Sign    Worried About Running Out of Food in the Last Year: Never true    [place] in the Last Year: Never true  Transportation Needs: No Transportation Needs (01/14/2022)   PRAPARE - Hydrologist (Medical): No    Lack of Transportation (Non-Medical): No  Physical Activity: Sufficiently Active (01/14/2022)   Exercise Vital Sign    Days of Exercise per Week: 5 days    Minutes of Exercise per Session: 30 min  Stress: No Stress Concern Present (01/14/2022)   [place]    Feeling of Stress : Not at all  Social Connections: Socially Isolated (01/14/2022)   Social Connection and Isolation Panel [NHANES]    Frequency of  Communication with Friends and Family: More than three times a week    Frequency of Social Gatherings with Friends and Family: Once a week    Attends Religious Services: Never    Marine scientist or Organizations: No    Attends Music therapist: Never    Marital Status: Never married    Tobacco Counseling Counseling given: Not Answered   Clinical Intake:  Pre-visit preparation completed: Yes  Pain : No/denies pain     Nutritional Risks: None Diabetes: Yes CBG done?: No Did pt. bring in CBG monitor from home?: No  How often do you need to have someone help you when you read instructions, pamphlets, or other written materials from your doctor or pharmacy?: 1 - Never  Nutrition Risk Assessment:  Has the patient had any N/V/D within the last 2 months?  Yes  Does the patient have any non-healing wounds?  No  Has the patient had any unintentional weight loss or weight gain?  No   Diabetes:  Is the patient diabetic?  Yes  If diabetic, was a CBG obtained today?  No Did the patient bring in their glucometer from home?  No  How often do you monitor your CBG's? 3 times weekly.   Financial Strains and Diabetes Management:  Are you having any financial strains with the device, your supplies or your medication? No .  Does the patient want to be seen by Chronic Care Management for management of their diabetes?  No  Would the patient like to be referred to a Nutritionist or for Diabetic Management?  No   Diabetic Exams:  Diabetic Eye Exam: Completed 12/17/21.   Diabetic Foot Exam: Completed 09/12/21.   Interpreter Needed?: No  Information entered by :: Mark Marker LPN   Activities of Daily Living    01/14/2022    2:11 PM  In your present state of health, do you have any difficulty performing the following activities:  Hearing? 0  Vision? 0  Difficulty concentrating or making decisions? 0  Walking or climbing stairs? 0  Dressing or bathing? 0  Doing  errands, shopping? 0  Preparing Food and eating ? N  Using the Toilet? N  In the past six months, have you accidently leaked urine? N  Do you have problems with loss of bowel control? N  Managing your Medications? N  Managing your Finances? N  Housekeeping or managing your Housekeeping? N    Patient Care Team: Juline Patch, MD as PCP - General (Family Medicine) Delana Meyer, Dolores Lory, MD (Vascular Surgery)  Indicate any recent Medical Services you may have received from other than [facility] providers in the past year (date may be approximate).     Assessment:   This is a routine wellness examination for Alondra.  Hearing/Vision screen Hearing Screening - Comments:: Pt denies hearing difficulty Vision Screening - Comments:: Annual vision screenings done at [facility]; [facility] [place] issues and exercise activities discussed: Current Exercise Habits: Home exercise routine, Type of exercise: walking, Time (Minutes): 30, Frequency (Times/Week): 5, Weekly Exercise (Minutes/Week): 150, Intensity: Moderate, Exercise limited by: None identified   Goals Addressed   None    Depression Screen    01/14/2022    2:09 PM 01/09/2022   10:18 AM 09/12/2021   10:00 AM 05/12/2021    9:17 AM 01/13/2021    2:05 PM 01/08/2021    9:26 AM 09/04/2020    9:01 AM  PHQ 2/9 Scores  PHQ - 2 Score 0 2 0 0 0 0 0  PHQ- 9 Score 0 3 0 0  0 0    Fall Risk    01/14/2022    2:11 PM 01/09/2022   10:18 AM 01/13/2021    2:08 PM 06/24/2020   10:55 AM 05/02/2020    8:05 AM  Fall Risk   Falls in the past year? 1 1 0 0 0  Number falls in past yr: 1 1 0    Injury with Fall? 1 1 0    Risk for fall due to : History of fall(s) No Fall Risks No Fall Risks    Follow up Falls prevention discussed Falls evaluation completed Falls prevention discussed Falls evaluation completed Falls evaluation completed    FALL RISK  PREVENTION PERTAINING TO THE HOME:  Any stairs in or around the home? Yes If so, are  there any without handrails? No  Home free of loose throw rugs in walkways, pet beds, electrical cords, etc? Yes  Adequate lighting in your home to reduce risk of falls? Yes   ASSISTIVE DEVICES UTILIZED TO PREVENT FALLS:  Life alert? No  Use of a cane, walker or w/c? No  Grab bars in the bathroom? No  Shower chair or bench in shower? Yes  Elevated toilet seat or a handicapped toilet? No   TIMED UP AND GO:  Was the test performed? No . Telephonic visit   Cognitive Function: Normal cognitive status assessed by direct observation by this Nurse Health Advisor. No abnormalities found.          Immunizations Immunization History  Administered Date(s) Administered   Influenza, High Dose Seasonal PF 05/04/2019, 04/17/2021   Influenza,inj,Quad PF,6+ Mos 04/23/2017, 05/02/2018   Influenza-Unspecified 05/04/2019, 04/29/2020   PFIZER(Purple Top)SARS-COV-2 Vaccination 09/15/2019, 10/11/2019, 07/04/2020   Pfizer Covid-19 Vaccine Bivalent Booster 64yr & up 04/17/2021   Pneumococcal Conjugate-13 06/09/2019   Pneumococcal Polysaccharide-23 12/27/2015   Tdap 07/26/2017    TDAP status: Up to date  Flu Vaccine status: Up to date  Pneumococcal vaccine status: Up to date  Covid-19 vaccine status: Completed vaccines  Qualifies for Shingles Vaccine? Yes   Zostavax completed No   Shingrix Completed?: No.    Education has been provided regarding the importance of this vaccine. Patient has been advised to call insurance company to determine out of pocket expense if they have not yet received this vaccine. Advised may also receive vaccine at local pharmacy or Health Dept. Verbalized acceptance and understanding.  Screening Tests Health Maintenance  Topic Date Due   Zoster Vaccines- Shingrix (1 of 2) Never done   COVID-19 Vaccine (5 - Pfizer series) 08/17/2021   Pneumonia Vaccine 68 Years old (3 - PPSV23 if available, else PCV20) 09/12/2022 (Originally 12/26/2020)   INFLUENZA VACCINE   02/17/2022   HEMOGLOBIN A1C  07/11/2022   FOOT EXAM  09/12/2022   URINE MICROALBUMIN  09/12/2022   OPHTHALMOLOGY EXAM  12/18/2022   TETANUS/TDAP  07/27/2027   Hepatitis C Screening  Completed   HPV VACCINES  Aged Out   COLONOSCOPY (Pts 45-442yrInsurance coverage will need to be confirmed)  Discontinued    Health Maintenance  Health Maintenance Due  Topic Date Due   Zoster Vaccines- Shingrix (1 of 2) Never done   COVID-19 Vaccine (5 - Pfizer series) 08/17/2021    Colorectal cancer screening: Type of screening: Colonoscopy. Completed 2012. Repeat every 10 years. Pt declines repeat screening colonoscopy. FOBT done 04/16/21  Lung Cancer Screening: (Low Dose CT Chest recommended if Age 10995-80ears, 30 pack-year currently smoking OR have quit w/in 15years.) does not qualify.  Additional Screening:  Hepatitis C Screening: does qualify; Completed 07/26/17  Vision Screening: Recommended annual ophthalmology exams for early detection of glaucoma and other disorders of the eye. Is the patient up to date with their annual eye exam?  Yes  Who is the provider or what is the name of the office in which the patient attends annual eye exams? Dr. ZaCoralyn Pear  Dental Screening: Recommended annual dental exams for proper oral hygiene  Community Resource Referral / Chronic Care Management: CRR required this visit?  No   CCM required this visit?  No      Plan:     I have personally reviewed and noted the following  in the patient's chart:   Medical and social history Use of alcohol, tobacco or illicit drugs  Current medications and supplements including opioid prescriptions. Patient is not currently taking opioid prescriptions. Functional ability and status Nutritional status Physical activity Advanced directives List of other physicians Hospitalizations, surgeries, and ER visits in previous 12 months Vitals Screenings to include cognitive, depression, and falls Referrals and  appointments  In addition, I have reviewed and discussed with patient certain preventive protocols, quality metrics, and best practice recommendations. A written personalized care plan for preventive services as well as general preventive health recommendations were provided to patient.     Mark Marker, LPN   [id number]   Nurse Notes: none

## 2022-01-19 NOTE — Progress Notes (Signed)
[facility] Note  01/27/2022     CHIEF COMPLAINT Patient presents for Retina Follow Up    HISTORY OF PRESENT ILLNESS: MELQUAN ERNSBERGER is a 66 y.o. male who presents to the clinic today for:   HPI     Retina Follow Up   Patient presents with  Other.  In right eye.  This started 1 year ago.  I, the attending physician,  performed the HPI with the patient and updated documentation appropriately.        Comments   Patient here for 1 year retina follow up for schisis OS, subretinal hemm OD. Patient states vision doing good. Got new glasses since last visit.      Last edited by Bernarda Caffey, MD on 01/27/2022 12:55 PM.    Patient states has pink eye a few weeks ago, otherwise stable. I see floaters in the right eye.    Referring physician: Agapito Games [place], [place] [address]  HISTORICAL INFORMATION:   Selected notes from the MEDICAL RECORD NUMBER Referred by Dr. Agapito Games for diabetic retinal eval LEE: 10/21/2020 BCVA OD: 20/25-2 OS: 20/25-2 Ocular Hx- ?DR PMH- DM, HTN, gout, last a1c was 5.7 in February 2022    CURRENT MEDICATIONS: No current outpatient medications on file. (Ophthalmic Drugs)   No current facility-administered medications for this visit. (Ophthalmic Drugs)   Current Outpatient Medications (Other)  Medication Sig   allopurinol (ZYLOPRIM) 100 MG tablet Take 1 tablet (100 mg total) by mouth daily.   amLODipine (NORVASC) 10 MG tablet TAKE 1 TABLET DAILY   apixaban (ELIQUIS) 2.5 MG TABS tablet Take 1 tablet (2.5 mg total) by mouth 2 (two) times daily.   b complex vitamins tablet Take 1 tablet by mouth daily.   blood glucose meter kit and supplies Dispense based on patient and insurance preference. Use up to four times daily as directed. (FOR ICD-10 E11.9).   Cholecalciferol 50 MCG (2000 UT) CAPS Take 2,000 Units by mouth daily.    FERROUS GLUCONATE IRON PO Take by mouth.   furosemide  (LASIX) 20 MG tablet Take 20 mg by mouth daily.   Insulin Pen Needle (NOVOFINE PEN NEEDLE) 32G X 6 MM MISC USE AND DISCARD 1 PEN      NEEDLE SUBCUTANEOUSLY      DAILY AS DIRECTED   Lancets (ONETOUCH DELICA PLUS [id number]) MISC USE AS DIRECTED EVERY DAY   liraglutide (VICTOZA) 18 MG/3ML SOPN INJECT 1.8MG SUBCUTANEOUSLYONCE DAILY   loratadine (CLARITIN) 10 MG tablet Take 10 mg by mouth at bedtime. otc   lovastatin (MEVACOR) 20 MG tablet Take 1 tablet (20 mg total) by mouth daily.   metoprolol succinate (TOPROL-XL) 100 MG 24 hr tablet TAKE 1 TABLET DAILY WITH OR IMMEDIATELY FOLLOWING A MEAL   ONETOUCH ULTRA test strip ONE TEST DAILY   pantoprazole (PROTONIX) 40 MG tablet Take 1 tablet (40 mg total) by mouth daily.   spironolactone (ALDACTONE) 25 MG tablet Take 25 mg by mouth daily.   No current facility-administered medications for this visit. (Other)   REVIEW OF SYSTEMS: ROS   Positive for: Gastrointestinal, Genitourinary, Endocrine, Cardiovascular, Eyes Negative for: Constitutional, Neurological, Skin, Musculoskeletal, HENT, Respiratory, Psychiatric, Allergic/Imm, Heme/Lymph Last edited by Theodore Demark, COA on 01/27/2022  9:58 AM.     ALLERGIES Allergies  Allergen Reactions   Ciprofloxacin Other (See Comments)    hallucination   Pollen Extract Other (See Comments)    Sneezing, watery eyes, etc.  Caffeine Palpitations    palpatations   PAST MEDICAL HISTORY Past Medical History:  Diagnosis Date   Allergy    Bilateral undescended testicles    Cancer ([place])    melanoma on forehead   Cataract    OU   Diabetes mellitus without complication ([place])    Dysrhythmia    a fib   GERD (gastroesophageal reflux disease)    Gout    Hepatitis    polycystic    Hypertension    Hypokalemia    Mixed hyperlipidemia    Polycystic kidney    Vitamin D deficiency    Past Surgical History:  Procedure Laterality Date   BASCILIC VEIN TRANSPOSITION Left 02/23/2020   Procedure: BASCILIC VEIN  TRANSPOSITION (SINGLE STAGE);  Surgeon: Katha Cabal, MD;  Location: [facility] ORS;  Service: Vascular;  Laterality: Left;   COLONOSCOPY  2012   hemorrhoids   melanoma removal     forehead    FAMILY HISTORY Family History  Problem Relation Age of Onset   Diabetes Mother    Heart disease Father     SOCIAL HISTORY Social History   Tobacco Use   Smoking status: Never   Smokeless tobacco: Never  Vaping Use   Vaping Use: Never used  Substance Use Topics   Alcohol use: No   Drug use: No       OPHTHALMIC EXAM:  Base Eye Exam     Visual Acuity (Snellen - Linear)       Right Left   Dist cc 20/20 -2 20/20 -2    Correction: Glasses  Has new glasses.        Tonometry (Tonopen, 9:55 AM)       Right Left   Pressure 11 11         Pupils       Dark Light Shape React APD   Right 3 2 Round Brisk None   Left 3 2 Round Brisk None         Visual Fields (Counting fingers)       Left Right    Full Full         Extraocular Movement       Right Left    Full, Ortho Full, Ortho         Neuro/Psych     Oriented x3: Yes   Mood/Affect: Normal         Dilation     Both eyes: 1.0% Mydriacyl, 2.5% Phenylephrine @ 9:55 AM           Slit Lamp and Fundus Exam     Slit Lamp Exam       Right Left   Lids/Lashes Dermatochalasis - upper lid, Meibomian gland dysfunction Dermatochalasis - upper lid, Meibomian gland dysfunction   Conjunctiva/Sclera White and quiet White and quiet   Cornea mild tear film debris, trace PEE mild tear film debris, trace PEE   Anterior Chamber Deep and quiet Deep and quiet   Iris Round and dilated Round and dilated   Lens 2+ Nuclear sclerosis, 2+ Cortical cataract 2+ Nuclear sclerosis, 2-3+ Cortical cataract   Anterior Vitreous Mild vitreous syneresis, Posterior vitreous detachment Mil vitreous syneresis         Fundus Exam       Right Left   Disc mild Pallor, Sharp rim, Compact mild Pallor, Sharp rim   C/D Ratio 0.2  0.4   Macula Flat, Blunted foveal reflex, RPE mottling and clumping Flat, Blunted foveal reflex, mild RPE mottling  and clumping, No heme or edema, fine drusen   Vessels attenuated, tortuous, AV crossing changes attenuated, tortuous, AV crossing changes   Periphery Attached, blonde fundus, prominent vortex veins, pigmented cystoid degeneration inferiorly Attached, shallow peripheral schisis-- stable, pigmented cystoid degeneration inferiorly, No RT/RD           Refraction     Wearing Rx       Sphere Cylinder Axis Add   Right -5.50 +1.25 064 +2.25   Left -3.50 +2.00 108 +2.25    Type: PAL           IMAGING AND PROCEDURES  Imaging and Procedures for 01/27/2022  OCT, Retina - OU - Both Eyes       Right Eye Quality was good. Central Foveal Thickness: 236. Progression has been stable. Findings include normal foveal contour, no IRF, no SRF (Foveal notch, focal ellipsoid irregularity and inner retinal hyper reflectivity along IT arcades -- stable).   Left Eye Quality was good. Central Foveal Thickness: 248. Progression has been stable. Findings include normal foveal contour, no IRF, no SRF, vitreomacular adhesion (Mild multilaminar schisis 360 periphery, sparing macula-- ? Mild improvement).   Notes *Images captured and stored on drive  Diagnosis / Impression:  NFP; no IRF/SRF OU OD: Foveal notch; focal ellipsoid irregularity and inner retinal hyper reflectivity along IT arcades --stable OS: Mild multilaminar schisis 360 periphery, sparing macula -- stable / mild improvement  Clinical management:  See below  Abbreviations: NFP - Normal foveal profile. CME - cystoid macular edema. PED - pigment epithelial detachment. IRF - intraretinal fluid. SRF - subretinal fluid. EZ - ellipsoid zone. ERM - epiretinal membrane. ORA - outer retinal atrophy. ORT - outer retinal tubulation. SRHM - subretinal hyper-reflective material. IRHM - intraretinal hyper-reflective material             ASSESSMENT/PLAN:    ICD-10-CM   1. 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 Subretinal hemorrhage of right eye  H35.61     3. Essential hypertension  I10     4. Hypertensive retinopathy of both eyes  H35.033 OCT, Retina - OU - Both Eyes    5. Combined forms of age-related cataract of both eyes  H25.813      1. Retinoschisis OS -- stable  - peripheral multilaminar schisis, sparing macula, stable -? improving-- identified on widefield OCT 07.11.23   - OCT shows shallow, multilaminar schisis in periphery only -- stable from prior  - no RT/RD on exam  - pt asymptomatic  - s/s of RD reviewed  - f/u 1 year, DFE, OCT  2. H/o Subretinal hemorrhage OD -- stably resolved  - incidental finding on routine eye exam by Dr. Marvel Plan  - pt asymptomatic  - focal subretinal heme along proximal IT arteriole OD  - OCT showed mild inner retinal hyperreflectivity and focal ellipsoid irregularity at focal heme site -- improved  - FA 4.13.22 without leakage, but +blockage at heme site -- not active  - pt with history of Afib, was on coumadin, now on eliquis -- history of difficulty controlling INR and easy bruising  - suspect focal subretinal heme is related to history of coumadin use and HTN  - not visually significant and not threatening vision  - discussed findings, prognosis  - no intervention recommended at this time  - monitor  - f/u 1 year, sooner prn -- DFE/OCT  3,4. Hypertensive retinopathy OU - discussed importance of tight BP control -  monitor  5. Mixed Cataract OU - The symptoms of cataract, surgical options, and treatments and risks were discussed with patient. - discussed diagnosis and progression - monitor  Ophthalmic Meds Ordered this visit:  No orders of the defined types were placed in this encounter.      Return in about 1 year (around 01/28/2023) for DFE, OCT.  There are no Patient Instructions on file for this visit.   Explained the  diagnoses, plan, and follow up with the patient and they expressed understanding.  Patient expressed understanding of the importance of proper follow up care.   This document serves as a record of services personally performed by Gardiner Sleeper, MD, PhD. It was created on their behalf by Renaldo Reel, [place] an ophthalmic technician. The creation of this record is the provider's dictation and/or activities during the visit.    Electronically signed by:  Renaldo Reel, COT  01/19/22 12:56 PM  This document serves as a record of services personally performed by Gardiner Sleeper, MD, PhD. It was created on their behalf by Renaldo Reel, [place] an ophthalmic technician. The creation of this record is the provider's dictation and/or activities during the visit.    Electronically signed by:  Renaldo Reel, COT  01/27/22 12:56 PM  Gardiner Sleeper, M.D., Ph.D. Diseases & Surgery of the Retina and Vitreous [facility]  I have reviewed the above documentation for accuracy and completeness, and I agree with the above. Gardiner Sleeper, M.D., Ph.D. 01/27/22 12:59 PM  Abbreviations: M myopia (nearsighted); A astigmatism; H hyperopia (farsighted); P presbyopia; Mrx spectacle prescription;  CTL contact lenses; OD right eye; OS left eye; OU both eyes  XT exotropia; ET esotropia; PEK punctate epithelial keratitis; PEE punctate epithelial erosions; DES dry eye syndrome; MGD meibomian gland dysfunction; ATs artificial tears; PFAT's preservative free artificial tears; Ensign nuclear sclerotic cataract; PSC posterior subcapsular cataract; ERM epi-retinal membrane; PVD posterior vitreous detachment; RD retinal detachment; DM diabetes mellitus; DR diabetic retinopathy; NPDR non-proliferative diabetic retinopathy; PDR proliferative diabetic retinopathy; CSME clinically significant macular edema; DME diabetic macular edema; dbh dot blot hemorrhages; CWS cotton wool spot; POAG primary open  angle glaucoma; C/D cup-to-disc ratio; HVF humphrey visual field; GVF goldmann visual field; OCT optical coherence tomography; IOP intraocular pressure; BRVO Branch retinal vein occlusion; CRVO central retinal vein occlusion; CRAO central retinal artery occlusion; BRAO branch retinal artery occlusion; RT retinal tear; SB scleral buckle; PPV pars plana vitrectomy; VH Vitreous hemorrhage; PRP panretinal laser photocoagulation; IVK intravitreal kenalog; VMT vitreomacular traction; MH Macular hole;  NVD neovascularization of the disc; NVE neovascularization elsewhere; AREDS age related eye disease study; ARMD age related macular degeneration; POAG primary open angle glaucoma; EBMD epithelial/anterior basement membrane dystrophy; ACIOL anterior chamber intraocular lens; IOL intraocular lens; PCIOL posterior chamber intraocular lens; Phaco/IOL phacoemulsification with intraocular lens placement; Brilliant photorefractive keratectomy; LASIK laser assisted in situ keratomileusis; HTN hypertension; DM diabetes mellitus; COPD chronic obstructive pulmonary disease

## 2022-01-20 ENCOUNTER — Other Ambulatory Visit: Payer: Self-pay | Admitting: Family Medicine

## 2022-01-20 DIAGNOSIS — I10 Essential (primary) hypertension: Secondary | ICD-10-CM

## 2022-01-27 ENCOUNTER — Encounter (INDEPENDENT_AMBULATORY_CARE_PROVIDER_SITE_OTHER): Payer: Self-pay | Admitting: Ophthalmology

## 2022-01-27 ENCOUNTER — Ambulatory Visit (INDEPENDENT_AMBULATORY_CARE_PROVIDER_SITE_OTHER): Payer: Medicare Other | Admitting: Ophthalmology

## 2022-01-27 DIAGNOSIS — H33102 Unspecified retinoschisis, left eye: Secondary | ICD-10-CM

## 2022-01-27 DIAGNOSIS — H35033 Hypertensive retinopathy, bilateral: Secondary | ICD-10-CM

## 2022-01-27 DIAGNOSIS — H3561 Retinal hemorrhage, right eye: Secondary | ICD-10-CM

## 2022-01-27 DIAGNOSIS — I10 Essential (primary) hypertension: Secondary | ICD-10-CM | POA: Diagnosis not present

## 2022-01-27 DIAGNOSIS — H25813 Combined forms of age-related cataract, bilateral: Secondary | ICD-10-CM

## 2022-02-02 DIAGNOSIS — N184 Chronic kidney disease, stage 4 (severe): Secondary | ICD-10-CM | POA: Diagnosis not present

## 2022-02-02 DIAGNOSIS — N2581 Secondary hyperparathyroidism of renal origin: Secondary | ICD-10-CM | POA: Diagnosis not present

## 2022-02-02 DIAGNOSIS — I10 Essential (primary) hypertension: Secondary | ICD-10-CM | POA: Diagnosis not present

## 2022-02-02 DIAGNOSIS — N185 Chronic kidney disease, stage 5: Secondary | ICD-10-CM | POA: Diagnosis not present

## 2022-02-02 DIAGNOSIS — M1A9XX0 Chronic gout, unspecified, without tophus (tophi): Secondary | ICD-10-CM | POA: Diagnosis not present

## 2022-02-02 DIAGNOSIS — D631 Anemia in chronic kidney disease: Secondary | ICD-10-CM | POA: Diagnosis not present

## 2022-02-02 DIAGNOSIS — Q613 Polycystic kidney, unspecified: Secondary | ICD-10-CM | POA: Diagnosis not present

## 2022-02-25 DIAGNOSIS — R252 Cramp and spasm: Secondary | ICD-10-CM | POA: Diagnosis not present

## 2022-02-25 DIAGNOSIS — D631 Anemia in chronic kidney disease: Secondary | ICD-10-CM | POA: Diagnosis not present

## 2022-02-25 DIAGNOSIS — I10 Essential (primary) hypertension: Secondary | ICD-10-CM | POA: Diagnosis not present

## 2022-02-25 DIAGNOSIS — M1A9XX0 Chronic gout, unspecified, without tophus (tophi): Secondary | ICD-10-CM | POA: Diagnosis not present

## 2022-02-25 DIAGNOSIS — Q613 Polycystic kidney, unspecified: Secondary | ICD-10-CM | POA: Diagnosis not present

## 2022-02-25 DIAGNOSIS — E876 Hypokalemia: Secondary | ICD-10-CM | POA: Diagnosis not present

## 2022-02-25 DIAGNOSIS — N184 Chronic kidney disease, stage 4 (severe): Secondary | ICD-10-CM | POA: Diagnosis not present

## 2022-02-25 DIAGNOSIS — Z6831 Body mass index (BMI) 31.0-31.9, adult: Secondary | ICD-10-CM | POA: Diagnosis not present

## 2022-02-25 DIAGNOSIS — N185 Chronic kidney disease, stage 5: Secondary | ICD-10-CM | POA: Diagnosis not present

## 2022-02-25 DIAGNOSIS — N2581 Secondary hyperparathyroidism of renal origin: Secondary | ICD-10-CM | POA: Diagnosis not present

## 2022-04-20 ENCOUNTER — Telehealth: Payer: Self-pay | Admitting: Nurse Practitioner

## 2022-04-20 ENCOUNTER — Encounter: Payer: Self-pay | Admitting: Family Medicine

## 2022-04-20 ENCOUNTER — Ambulatory Visit (INDEPENDENT_AMBULATORY_CARE_PROVIDER_SITE_OTHER): Payer: Medicare Other | Admitting: Family Medicine

## 2022-04-20 VITALS — BP 122/80 | HR 60 | Ht 72.0 in | Wt 228.0 lb

## 2022-04-20 DIAGNOSIS — I890 Lymphedema, not elsewhere classified: Secondary | ICD-10-CM | POA: Diagnosis not present

## 2022-04-20 DIAGNOSIS — H6993 Unspecified Eustachian tube disorder, bilateral: Secondary | ICD-10-CM

## 2022-04-20 DIAGNOSIS — Z23 Encounter for immunization: Secondary | ICD-10-CM | POA: Diagnosis not present

## 2022-04-20 DIAGNOSIS — I872 Venous insufficiency (chronic) (peripheral): Secondary | ICD-10-CM | POA: Diagnosis not present

## 2022-04-20 MED ORDER — TRIAMCINOLONE ACETONIDE 55 MCG/ACT NA AERO: 2.0000 | INHALATION_SPRAY | Freq: Every day | NASAL | 12 refills | Status: DC

## 2022-04-20 MED ORDER — TRIAMCINOLONE ACETONIDE 55 MCG/ACT NA AERO
2.0000 | INHALATION_SPRAY | Freq: Every day | NASAL | 12 refills | Status: DC
Start: 2022-04-20 — End: 2022-05-13

## 2022-04-20 NOTE — Patient Instructions (Signed)
Lymphedema ? ?Lymphedema is swelling that is caused by the abnormal collection of lymph in the tissues under the skin. Lymph is excess fluid from the tissues in your body that is removed through the lymphatic system. This system is part of your body's defense system (immune system) and includes lymph nodes and lymph vessels. The lymph vessels collect and carry the excess fluid, fats, proteins, and waste from the tissues of the body to the bloodstream. This system also works to clean and remove bacteria and waste products from the body. ?Lymphedema occurs when the lymphatic system is blocked. When the lymph vessels or lymph nodes are blocked or damaged, lymph does not drain properly. This causes an abnormal buildup of lymph, which leads to swelling in the affected area. This may include the trunk area, or an arm or leg. Lymphedema cannot be cured by medicines, but various methods can be used to help reduce the swelling. ?What are the causes? ?The cause of this condition depends on the type of lymphedema that you have. ?Primary lymphedema is caused by the absence of lymph vessels or having abnormal lymph vessels at birth. ?Secondary lymphedema occurs when lymph vessels are blocked or damaged. Secondary lymphedema is more common. Common causes of lymph vessel blockage include: ?Skin infection, such as cellulitis. ?Infection by parasites (filariasis). ?Injury. ?Radiation therapy. ?Cancer. ?Formation of scar tissue. ?Surgery. ?What are the signs or symptoms? ?Symptoms of this condition include: ?Swelling of the arm or leg. ?A heavy or tight feeling in the arm or leg. ?Swelling of the feet, toes, or fingers. Shoes or rings may fit more tightly than before. ?Redness of the skin over the affected area. ?Limited movement of the affected limb. ?Sensitivity to touch or discomfort in the affected limb. ?How is this diagnosed? ?This condition may be diagnosed based on: ?Your symptoms and medical history. ?A physical  exam. ?Bioimpedance spectroscopy. In this test, painless electrical currents are used to measure fluid levels in your body. ?Imaging tests, such as: ?MRI. ?CT scan. ?Duplex ultrasound. This test uses sound waves to produce images of the vessels and the blood flow on a screen. ?Lymphoscintigraphy. In this test, a low dose of a radioactive substance is injected to trace the flow of lymph through your lymph vessels. ?Lymphangiography. In this test, a contrast dye is injected into the lymph vessel to help show blockages. ?How is this treated? ? ?If an underlying condition is causing the lymphedema, that condition will be treated. For example, antibiotic medicines may be used to treat an infection. ?Treatment for this condition will depend on the cause of your lymphedema. Treatment may include: ?Complete decongestive therapy (CDT). This is done by a certified lymphedema therapist to reduce fluid congestion. This therapy includes: ?Skin care. ?Compression wrapping of the affected area. ?Manual lymph drainage. This is a special massage technique that promotes lymph drainage out of a limb. ?Specific exercises. Certain exercises can help fluid move out of the affected limb. ?Compression. Various methods may be used to apply pressure to the affected limb to reduce the swelling. They include: ?Wearing compression stockings or sleeves on the affected limb. ?Wrapping the affected limb with special bandages. ?Surgery. This is usually done for severe cases only. For example, surgery may be done if you have trouble moving the limb or if the swelling does not get better with other treatments. ?Follow these instructions at home: ?Self-care ?The affected area is more likely to become injured or infected. Take these steps to help prevent infection: ?Keep the affected   area clean and dry. ?Use approved creams or lotions to keep the skin moisturized. ?Protect your skin from cuts: ?Use gloves while cooking or gardening. ?Do not walk  barefoot. ?If you shave the affected area, use an electric razor. ?Do not wear tight clothes, shoes, or jewelry. ?Eat a healthy diet that includes a lot of fruits and vegetables. ?Activity ?Do exercises as told by your health care provider. ?Do not sit with your legs crossed. ?When possible, keep the affected limb raised (elevated) above the level of your heart. ?Avoid carrying things with an arm that is affected by lymphedema. ?General instructions ?Wear compression stockings or sleeves as told by your health care provider. ?Note any changes in size of the affected limb. You may be instructed to take regular measurements and keep track of them. ?Take over-the-counter and prescription medicines only as told by your health care provider. ?If you were prescribed an antibiotic medicine, take or apply it as told by your health care provider. Do not stop using the antibiotic even if you start to feel better or if your condition improves. ?Do not use heating pads or ice packs on the affected area. ?Avoid having blood draws, IV insertions, or blood pressure checks on the affected limb. ?Keep all follow-up visits. This is important. ?Contact a health care provider if you: ?Continue to have swelling in your limb. ?Have fluid leaking from the skin of your swollen limb. ?Have a cut that does not heal. ?Have redness or pain in the affected area. ?Develop purplish spots, rash, blisters, or sores (lesions) on your affected limb. ?Get help right away if you: ?Have new swelling in your limb that starts suddenly. ?Have shortness of breath or chest pain. ?Have a fever or chills. ?These symptoms may represent a serious problem that is an emergency. Do not wait to see if the symptoms will go away. Get medical help right away. Call your local emergency services (911 in the [place] Do not drive yourself to the hospital. ?Summary ?Lymphedema is swelling that is caused by the abnormal collection of lymph in the tissues under the  skin. ?Lymph is fluid from the tissues in your body that is removed through the lymphatic system. This system collects and carries excess fluid, fats, proteins, and wastes from the tissues of the body to the bloodstream. ?Lymphedema causes swelling, pain, and redness in the affected area. This may include the trunk area, or an arm or leg. ?Treatment for this condition may depend on the cause of your lymphedema. Treatment may include treating the underlying cause, complete decongestive therapy (CDT), compression methods, or surgery. ?This information is not intended to replace advice given to you by your health care provider. Make sure you discuss any questions you have with your health care provider. ?Document Revised: 05/01/2020 Document Reviewed: 05/01/2020 ?Elsevier Patient Education ? 2023 [employer] ? ?

## 2022-04-20 NOTE — Progress Notes (Signed)
Date:  04/20/2022   Name:  Mark Bauer   DOB:  08/16/1955   MRN:  [id number]   Chief Complaint: blisters on legs (Blisters on L) leg started 4 days ago when one popped and fluid ran down leg. Water stings and Balmex helps some. Leg is always discolored. No pitting edema) and Ear Fullness (Can't get it to "pop")  Patient is a 66 year old male who presents for a left leg swelling with new onset vescles. The patient reports the following problems: "blisters". Health maintenance has been reviewed up to date.    Ear Fullness  There is pain in the right ear. This is a new problem. The current episode started more than 1 month ago. The problem has been waxing and waning. There has been no fever. The pain is moderate. Associated symptoms include a rash and rhinorrhea. Pertinent negatives include no abdominal pain, coughing, diarrhea, ear discharge, headaches, hearing loss, neck pain or sore throat. He has tried nothing for the symptoms.    Lab Results  Component Value Date   NA 141 01/09/2022   K 4.9 01/09/2022   CO2 16 (L) 01/09/2022   GLUCOSE 95 01/09/2022   BUN 94 (HH) 01/09/2022   CREATININE 8.17 (H) 01/09/2022   CALCIUM 10.6 (H) 01/09/2022   EGFR 7 (L) 01/09/2022   GFRNONAA 8 (L) 02/16/2020   Lab Results  Component Value Date   CHOL 100 05/02/2020   HDL 34 (L) 05/02/2020   LDLCALC 55 05/02/2020   TRIG 42 05/02/2020   CHOLHDL 3.7 12/16/2016   Lab Results  Component Value Date   TSH 3.040 01/09/2019   Lab Results  Component Value Date   HGBA1C 5.5 01/09/2022   Lab Results  Component Value Date   WBC 5.9 09/12/2021   HGB 10.0 (L) 09/12/2021   HCT 30.8 (L) 09/12/2021   MCV 103 (H) 09/12/2021   PLT 119 (L) 09/12/2021   Lab Results  Component Value Date   ALT 19 01/09/2022   AST 18 01/09/2022   ALKPHOS 62 01/09/2022   BILITOT 0.6 01/09/2022   No results found for: "25OHVITD2", "25OHVITD3", "VD25OH"   Review of Systems  HENT:  Positive for rhinorrhea. Negative  for ear discharge, hearing loss, sore throat and trouble swallowing.   Respiratory:  Negative for cough and wheezing.   Cardiovascular:  Positive for leg swelling. Negative for chest pain and palpitations.  Gastrointestinal:  Negative for abdominal pain and diarrhea.  Endocrine: Negative for polydipsia and polyuria.  Musculoskeletal:  Negative for neck pain.  Skin:  Positive for rash and wound.  Neurological:  Negative for headaches.    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   Caffeine Palpitations    palpatations    Past Surgical History:  Procedure Laterality Date   BASCILIC VEIN TRANSPOSITION Left 02/23/2020   Procedure: BASCILIC VEIN TRANSPOSITION (SINGLE STAGE);  Surgeon: Katha Cabal, MD;  Location: [facility] ORS;  Service: Vascular;  Laterality: Left;   COLONOSCOPY  2012   hemorrhoids   melanoma removal     forehead    Social History   Tobacco Use   Smoking status: Never   Smokeless tobacco: Never  Vaping Use   Vaping Use: Never used  Substance Use Topics   Alcohol use: No   Drug use: No      Medication list has been reviewed and updated.  Current Meds  Medication Sig   allopurinol (ZYLOPRIM) 100 MG tablet Take 1 tablet (100 mg total) by mouth daily.   amLODipine (NORVASC) 10 MG tablet TAKE 1 TABLET DAILY   apixaban (ELIQUIS) 2.5 MG TABS tablet Take 1 tablet (2.5 mg total) by mouth 2 (two) times daily.   b complex vitamins tablet Take 1 tablet by mouth daily.   blood glucose meter kit and supplies Dispense based on patient and insurance preference. Use up to four times daily as directed. (FOR ICD-10 E11.9).   Cholecalciferol 50 MCG (2000 UT) CAPS Take 2,000 Units by mouth daily.    FERROUS GLUCONATE IRON PO Take by mouth.   furosemide (LASIX) 20 MG tablet Take 20 mg by mouth daily.   Insulin Pen Needle (NOVOFINE PEN NEEDLE) 32G X 6 MM MISC USE AND DISCARD 1 PEN      NEEDLE SUBCUTANEOUSLY      DAILY AS DIRECTED   Lancets (ONETOUCH DELICA PLUS [id number]) MISC USE AS DIRECTED EVERY DAY   liraglutide (VICTOZA) 18 MG/3ML SOPN INJECT 1.8MG SUBCUTANEOUSLYONCE DAILY   loratadine (CLARITIN) 10 MG tablet Take 10 mg by mouth at bedtime. otc   lovastatin (MEVACOR) 20 MG tablet Take 1 tablet (20 mg total) by mouth daily.   metoprolol succinate (TOPROL-XL) 100 MG 24 hr tablet TAKE 1 TABLET DAILY WITH OR IMMEDIATELY FOLLOWING A MEAL   ONETOUCH ULTRA test strip ONE TEST DAILY   pantoprazole (PROTONIX) 40 MG tablet Take 1 tablet (40 mg total) by mouth daily.   spironolactone (ALDACTONE) 25 MG tablet Take 25 mg by mouth daily.       04/20/2022    9:21 AM 01/09/2022   10:18 AM 09/12/2021   10:00 AM 05/12/2021    9:18 AM  GAD 7 : Generalized Anxiety Score  Nervous, Anxious, on Edge 0 0 0 0  Control/stop worrying 0 0 0 0  Worry too much - different things 0 0 0 0  Trouble relaxing 0 0 0 0  Restless 0 0 0 0  Easily annoyed or irritable 0 0 0 0  Afraid - awful might happen 0 0 0 0  Total GAD 7 Score 0 0 0 0  Anxiety Difficulty Not difficult at all Not difficult at all Not difficult at  all        04/20/2022    9:20 AM 01/14/2022    2:09 PM 01/09/2022   10:18 AM  Depression screen PHQ 2/9  Decreased Interest 0 0 1  Down, Depressed, Hopeless 0 0 1  PHQ - 2 Score 0 0 2  Altered sleeping 0 0 1  Tired, decreased energy 0 0 0  Change in appetite 0 0 0  Feeling bad or failure about yourself  0 0 0  Trouble  concentrating 0 0 0  Moving slowly or fidgety/restless 0 0 0  Suicidal thoughts 0 0 0  PHQ-9 Score 0 0 3  Difficult doing work/chores Not difficult at all Not difficult at all Not difficult at all    BP Readings from Last 3 Encounters:  04/20/22 122/80  01/09/22 130/78  09/12/21 (!) 122/56    Physical Exam Vitals and nursing note reviewed.  HENT:     Head: Normocephalic.     Right Ear: External ear normal. Tympanic membrane is retracted.     Left Ear: External ear normal. A middle ear effusion is present.     Nose: Nose normal.     Mouth/Throat:     Mouth: Mucous membranes are moist.  Eyes:     General: No scleral icterus.       Right eye: No discharge.        Left eye: No discharge.     Conjunctiva/sclera: Conjunctivae normal.     Pupils: Pupils are equal, round, and reactive to light.  Neck:     Thyroid: No thyromegaly.     Vascular: No JVD.     Trachea: No tracheal deviation.  Cardiovascular:     Rate and Rhythm: Normal rate and regular rhythm.     Heart sounds: Normal heart sounds. No murmur heard.    No friction rub. No gallop.  Pulmonary:     Effort: No respiratory distress.     Breath sounds: Normal breath sounds. No wheezing or rales.  Abdominal:     General: Bowel sounds are normal.     Palpations: Abdomen is soft. There is no mass.     Tenderness: There is no abdominal tenderness. There is no guarding or rebound.  Musculoskeletal:        General: No tenderness. Normal range of motion.     Cervical back: Normal range of motion and neck supple.  Lymphadenopathy:     Cervical: No cervical adenopathy.  Skin:    General: Skin is warm.      Findings: Lesion present. No rash.     Comments: Left leg with denuded vesicles  Neurological:     Mental Status: He is alert and oriented to person, place, and time.     Cranial Nerves: No cranial nerve deficit.     Deep Tendon Reflexes: Reflexes are normal and symmetric.     Wt Readings from Last 3 Encounters:  04/20/22 228 lb (103.4 kg)  01/09/22 224 lb (101.6 kg)  09/12/21 228 lb (103.4 kg)    BP 122/80   Pulse 60   Ht 6' (1.829 m)   Wt 228 lb (103.4 kg)   BMI 30.92 kg/m   Assessment and Plan: 1. Venous insufficiency Chronic.  Bilateral pitting edema.  Diabetic lipoidica noted.  There is a superficial blisters that have burst and have a denuded area without infection.  Patient's been instructed to keep them clean with Hibiclens over pHisoDerm pat dry and apply gauze dressing loosely.  We will refer to vascular surgery. - Ambulatory referral to Vascular Surgery  2. Lymphedema Lymphedema has also been noted and this will be addressed as well. - Ambulatory referral to Vascular Surgery  3. Eustachian tube dysfunction, bilateral Bilateral eustachian tube concerns with serous otitis on the left and retraction of the tympanic membrane on the right.  We will treat with Nasacort inhaler 2 sprays into nostril daily. - triamcinolone (NASACORT) 55 MCG/ACT AERO nasal inhaler; Place 2 sprays into the nose daily.  Dispense:  1 each; Refill: 12     Otilio Miu, MD

## 2022-04-20 NOTE — Telephone Encounter (Signed)
Received a call from the On Call nurse regarding patient's Nasacort spray not being at the pharmacy. Patient requesting it to be resent.  Medication sent in for patient.

## 2022-04-23 ENCOUNTER — Encounter: Payer: Self-pay | Admitting: Family Medicine

## 2022-04-27 ENCOUNTER — Encounter: Payer: Self-pay | Admitting: Family Medicine

## 2022-05-12 ENCOUNTER — Other Ambulatory Visit (INDEPENDENT_AMBULATORY_CARE_PROVIDER_SITE_OTHER): Payer: Self-pay | Admitting: Nurse Practitioner

## 2022-05-12 ENCOUNTER — Encounter: Payer: Self-pay | Admitting: Family Medicine

## 2022-05-12 ENCOUNTER — Ambulatory Visit (INDEPENDENT_AMBULATORY_CARE_PROVIDER_SITE_OTHER): Payer: Medicare Other | Admitting: Family Medicine

## 2022-05-12 VITALS — BP 128/60 | HR 69 | Ht 72.0 in | Wt 223.0 lb

## 2022-05-12 DIAGNOSIS — I872 Venous insufficiency (chronic) (peripheral): Secondary | ICD-10-CM

## 2022-05-12 DIAGNOSIS — E1121 Type 2 diabetes mellitus with diabetic nephropathy: Secondary | ICD-10-CM

## 2022-05-12 DIAGNOSIS — I890 Lymphedema, not elsewhere classified: Secondary | ICD-10-CM

## 2022-05-12 MED ORDER — NOVOFINE PEN NEEDLE 32G X 6 MM MISC: 1 refills | Status: DC

## 2022-05-12 MED ORDER — VICTOZA 18 MG/3ML ~~LOC~~ SOPN: PEN_INJECTOR | SUBCUTANEOUS | 1 refills | Status: DC

## 2022-05-12 NOTE — Progress Notes (Signed)
Date:  05/12/2022   Name:  Mark Bauer   DOB:  Apr 06, 1956   MRN:  [id number]   Chief Complaint: Diabetes  Diabetes He presents for his follow-up diabetic visit. He has type 2 diabetes mellitus. His disease course has been stable. There are no hypoglycemic associated symptoms. Pertinent negatives for diabetes include no blurred vision, no chest pain, no fatigue, no foot paresthesias, no foot ulcerations, no polydipsia, no polyphagia, no polyuria, no visual change, no weakness and no weight loss. There are no hypoglycemic complications. Symptoms are stable. There are no diabetic complications. Risk factors for coronary artery disease include hypertension. Current diabetic treatments: victoza. He is compliant with treatment all of the time. His weight is stable. He is following a generally healthy diet. Meal planning includes avoidance of concentrated sweets. He participates in exercise intermittently. His breakfast blood glucose is taken between 8-9 am. His breakfast blood glucose range is generally 90-110 mg/dl. An ACE inhibitor/angiotensin II receptor blocker is being taken.    Lab Results  Component Value Date   NA 141 01/09/2022   K 4.9 01/09/2022   CO2 16 (L) 01/09/2022   GLUCOSE 95 01/09/2022   BUN 94 (HH) 01/09/2022   CREATININE 8.17 (H) 01/09/2022   CALCIUM 10.6 (H) 01/09/2022   EGFR 7 (L) 01/09/2022   GFRNONAA 8 (L) 02/16/2020   Lab Results  Component Value Date   CHOL 100 05/02/2020   HDL 34 (L) 05/02/2020   LDLCALC 55 05/02/2020   TRIG 42 05/02/2020   CHOLHDL 3.7 12/16/2016   Lab Results  Component Value Date   TSH 3.040 01/09/2019   Lab Results  Component Value Date   HGBA1C 5.5 01/09/2022   Lab Results  Component Value Date   WBC 5.9 09/12/2021   HGB 10.0 (L) 09/12/2021   HCT 30.8 (L) 09/12/2021   MCV 103 (H) 09/12/2021   PLT 119 (L) 09/12/2021   Lab Results  Component Value Date   ALT 19 01/09/2022   AST 18 01/09/2022   ALKPHOS 62 01/09/2022    BILITOT 0.6 01/09/2022   No results found for: "25OHVITD2", "25OHVITD3", "VD25OH"   Review of Systems  Constitutional:  Negative for fatigue, unexpected weight change and weight loss.  HENT:  Negative for trouble swallowing.   Eyes:  Negative for blurred vision.  Respiratory:  Negative for chest tightness and shortness of breath.   Cardiovascular:  Positive for leg swelling. Negative for chest pain and palpitations.  Gastrointestinal:  Negative for blood in stool and constipation.  Endocrine: Negative for polydipsia, polyphagia and polyuria.  Genitourinary:  Negative for difficulty urinating.  Neurological:  Negative for weakness.    Patient Active Problem List   Diagnosis Date Noted   End stage renal disease ([place]) 06/04/2021   Anticoagulated 02/58/5277   Complication of vascular access for dialysis 06/06/2020   Fistula 02/23/2020   Polycystic liver disease 01/10/2020   Polycystic kidney disease 82/42/3536   Metabolic acidosis 14/43/1540   CKD stage 4 due to type 2 diabetes mellitus ([place]) 01/25/2018   Type 2 diabetes mellitus with diabetic nephropathy, without long-term current use of insulin ([place]) 01/25/2018   Secondary hyperparathyroidism of renal origin ([place]) 08/19/2017   Chronic atrial fibrillation ([place]) 01/06/2017   Essential hypertension 01/06/2017   Hyperuricemia 01/06/2017   Mixed hyperlipidemia 01/06/2017   Gastroesophageal reflux disease 01/06/2017   Obesity 12/26/2012    Allergies  Allergen Reactions   Ciprofloxacin Other (See Comments)    hallucination   Pollen Extract  Other (See Comments)    Sneezing, watery eyes, etc.   Caffeine Palpitations    palpatations    Past Surgical History:  Procedure Laterality Date   BASCILIC VEIN TRANSPOSITION Left 02/23/2020   Procedure: BASCILIC VEIN TRANSPOSITION (SINGLE STAGE);  Surgeon: Katha Cabal, MD;  Location: [facility] ORS;  Service: Vascular;  Laterality: Left;   COLONOSCOPY  2012   hemorrhoids   melanoma  removal     forehead    Social History   Tobacco Use   Smoking status: Never   Smokeless tobacco: Never  Vaping Use   Vaping Use: Never used  Substance Use Topics   Alcohol use: No   Drug use: No     Medication list has been reviewed and updated.  Current Meds  Medication Sig   allopurinol (ZYLOPRIM) 100 MG tablet Take 1 tablet (100 mg total) by mouth daily.   amLODipine (NORVASC) 10 MG tablet TAKE 1 TABLET DAILY   apixaban (ELIQUIS) 2.5 MG TABS tablet Take 1 tablet (2.5 mg total) by mouth 2 (two) times daily.   b complex vitamins tablet Take 1 tablet by mouth daily.   blood glucose meter kit and supplies Dispense based on patient and insurance preference. Use up to four times daily as directed. (FOR ICD-10 E11.9).   Cholecalciferol 50 MCG (2000 UT) CAPS Take 2,000 Units by mouth daily.    FERROUS GLUCONATE IRON PO Take by mouth.   furosemide (LASIX) 20 MG tablet Take 20 mg by mouth daily.   Insulin Pen Needle (NOVOFINE PEN NEEDLE) 32G X 6 MM MISC USE AND DISCARD 1 PEN      NEEDLE SUBCUTANEOUSLY      DAILY AS DIRECTED   Lancets (ONETOUCH DELICA PLUS [id number]) MISC USE AS DIRECTED EVERY DAY   liraglutide (VICTOZA) 18 MG/3ML SOPN INJECT 1.$RemoveBefor'8MG'[id number]$  SUBCUTANEOUSLYONCE DAILY   loratadine (CLARITIN) 10 MG tablet Take 10 mg by mouth at bedtime. otc   lovastatin (MEVACOR) 20 MG tablet Take 1 tablet (20 mg total) by mouth daily.   metoprolol succinate (TOPROL-XL) 100 MG 24 hr tablet TAKE 1 TABLET DAILY WITH OR IMMEDIATELY FOLLOWING A MEAL   ONETOUCH ULTRA test strip ONE TEST DAILY   pantoprazole (PROTONIX) 40 MG tablet Take 1 tablet (40 mg total) by mouth daily.   spironolactone (ALDACTONE) 25 MG tablet Take 25 mg by mouth daily.   triamcinolone (NASACORT) 55 MCG/ACT AERO nasal inhaler Place 2 sprays into the nose daily.       05/12/2022   10:06 AM 04/20/2022    9:21 AM 01/09/2022   10:18 AM 09/12/2021   10:00 AM  GAD 7 : Generalized Anxiety Score  Nervous, Anxious, on Edge 0 0 0 0   Control/stop worrying 1 0 0 0  Worry too much - different things 1 0 0 0  Trouble relaxing 0 0 0 0  Restless 0 0 0 0  Easily annoyed or irritable 0 0 0 0  Afraid - awful might happen 0 0 0 0  Total GAD 7 Score 2 0 0 0  Anxiety Difficulty Not difficult at all Not difficult at all Not difficult at all Not difficult at all       05/12/2022   10:05 AM 04/20/2022    9:20 AM 01/14/2022    2:09 PM  Depression screen PHQ 2/9  Decreased Interest 0 0 0  Down, Depressed, Hopeless 1 0 0  PHQ - 2 Score 1 0 0  Altered sleeping 0 0 0  Tired, decreased  energy 0 0 0  Change in appetite 0 0 0  Feeling bad or failure about yourself  1 0 0  Trouble concentrating 0 0 0  Moving slowly or fidgety/restless 0 0 0  Suicidal thoughts 0 0 0  PHQ-9 Score 2 0 0  Difficult doing work/chores Not difficult at all Not difficult at all Not difficult at all    BP Readings from Last 3 Encounters:  05/12/22 128/60  04/20/22 122/80  01/09/22 130/78    Physical Exam Vitals and nursing note reviewed.  HENT:     Head: Normocephalic.     Right Ear: Tympanic membrane and external ear normal.     Left Ear: Tympanic membrane and external ear normal.     Nose: Nose normal.  Eyes:     General: No scleral icterus.       Right eye: No discharge.        Left eye: No discharge.     Conjunctiva/sclera: Conjunctivae normal.     Pupils: Pupils are equal, round, and reactive to light.  Neck:     Thyroid: No thyromegaly.     Vascular: No JVD.     Trachea: No tracheal deviation.  Cardiovascular:     Rate and Rhythm: Normal rate and regular rhythm.     Heart sounds: Normal heart sounds. No murmur heard.    No friction rub. No gallop.  Pulmonary:     Effort: No respiratory distress.     Breath sounds: Normal breath sounds. No wheezing, rhonchi or rales.  Abdominal:     General: Bowel sounds are normal.     Palpations: Abdomen is soft. There is no hepatomegaly, splenomegaly or mass.     Tenderness: There is no  abdominal tenderness. There is no guarding or rebound.  Musculoskeletal:        General: No tenderness. Normal range of motion.     Cervical back: Normal range of motion and neck supple.  Lymphadenopathy:     Cervical: No cervical adenopathy.  Skin:    General: Skin is warm.     Findings: No rash.  Neurological:     Mental Status: He is alert and oriented to person, place, and time.     Cranial Nerves: No cranial nerve deficit.     Deep Tendon Reflexes: Reflexes are normal and symmetric.     Wt Readings from Last 3 Encounters:  05/12/22 223 lb (101.2 kg)  04/20/22 228 lb (103.4 kg)  01/09/22 224 lb (101.6 kg)    BP 128/60   Pulse 69   Ht 6' (1.829 m)   Wt 223 lb (101.2 kg)   SpO2 96%   BMI 30.24 kg/m   Assessment and Plan: 1. Type 2 diabetes mellitus with diabetic nephropathy, without long-term current use of insulin (HCC) Chronic.  Controlled.  Stable.  Continue Victoza 1.8 mg subcu once a day.  Check A1c for current level of control. - liraglutide (VICTOZA) 18 MG/3ML SOPN; INJECT 1.8MG SUBCUTANEOUSLYONCE DAILY  Dispense: 27 mL; Refill: 1 - HgB A1c - Insulin Pen Needle (NOVOFINE PEN NEEDLE) 32G X 6 MM MISC; USE AND DISCARD 1 PEN      NEEDLE SUBCUTANEOUSLY      DAILY AS DIRECTED  Dispense: 100 each; Refill: 1     Otilio Miu, MD

## 2022-05-13 ENCOUNTER — Ambulatory Visit (INDEPENDENT_AMBULATORY_CARE_PROVIDER_SITE_OTHER): Payer: Medicare Other | Admitting: Nurse Practitioner

## 2022-05-13 ENCOUNTER — Encounter (INDEPENDENT_AMBULATORY_CARE_PROVIDER_SITE_OTHER): Payer: Self-pay | Admitting: Nurse Practitioner

## 2022-05-13 ENCOUNTER — Ambulatory Visit (INDEPENDENT_AMBULATORY_CARE_PROVIDER_SITE_OTHER): Payer: Medicare Other

## 2022-05-13 VITALS — BP 113/71 | HR 89 | Resp 18 | Ht 72.0 in | Wt 222.6 lb

## 2022-05-13 DIAGNOSIS — L97909 Non-pressure chronic ulcer of unspecified part of unspecified lower leg with unspecified severity: Secondary | ICD-10-CM

## 2022-05-13 DIAGNOSIS — I83009 Varicose veins of unspecified lower extremity with ulcer of unspecified site: Secondary | ICD-10-CM | POA: Diagnosis not present

## 2022-05-13 DIAGNOSIS — N186 End stage renal disease: Secondary | ICD-10-CM

## 2022-05-13 DIAGNOSIS — E782 Mixed hyperlipidemia: Secondary | ICD-10-CM

## 2022-05-13 DIAGNOSIS — I872 Venous insufficiency (chronic) (peripheral): Secondary | ICD-10-CM

## 2022-05-13 DIAGNOSIS — I890 Lymphedema, not elsewhere classified: Secondary | ICD-10-CM

## 2022-05-13 DIAGNOSIS — I10 Essential (primary) hypertension: Secondary | ICD-10-CM

## 2022-05-13 LAB — HEMOGLOBIN A1C
Est. average glucose Bld gHb Est-mCnc: 111 mg/dL
Hgb A1c MFr Bld: 5.5 % (ref 4.8–5.6)

## 2022-05-18 ENCOUNTER — Encounter (INDEPENDENT_AMBULATORY_CARE_PROVIDER_SITE_OTHER): Payer: Self-pay | Admitting: Nurse Practitioner

## 2022-05-18 ENCOUNTER — Encounter (INDEPENDENT_AMBULATORY_CARE_PROVIDER_SITE_OTHER): Payer: Self-pay

## 2022-05-18 NOTE — Progress Notes (Signed)
Subjective:    Patient ID: Mark Bauer, male    DOB: 14-Sep-1955, 66 y.o.   MRN: [id number] No chief complaint on file.   Mark Bauer is a 66 year old male that returns today for 1 year evaluation of his hemodialysis access.  The patient has not yet begun dialysis.  His most recent creatinine in July was 7.6 with a GFR 7.  He does have significant lower extremity edema but no uremic symptoms.  He has also been referred by primary care provider for concern for lower extremity edema and ulcerations.  Currently the patient does not utilize medical grade compression regularly.  He does elevate his lower extremities.  He has been using topical dressings but these have not been helping the wounds greatly.  Currently there is no weeping of the ulcerated areas.  Today the patient has a flow volume of 2867.  No areas of significant stenosis.    Review of Systems  Cardiovascular:  Positive for leg swelling.  Skin:  Positive for wound.  All other systems reviewed and are negative.      Objective:   Physical Exam Vitals reviewed.  HENT:     Head: Normocephalic.  Cardiovascular:     Rate and Rhythm: Normal rate.     Pulses: Normal pulses.          Radial pulses are 2+ on the right side and 2+ on the left side.     Arteriovenous access: Left arteriovenous access is present.    Comments: Good thrill and bruit Pulmonary:     Effort: Pulmonary effort is normal.  Musculoskeletal:     Right lower leg: 2+ Pitting Edema present.     Left lower leg: 2+ Pitting Edema present.  Skin:    General: Skin is warm and dry.  Neurological:     Mental Status: He is alert and oriented to person, place, and time.  Psychiatric:        Mood and Affect: Mood normal.        Behavior: Behavior normal.        Thought Content: Thought content normal.        Judgment: Judgment normal.     BP 113/71 (BP Location: Right Arm)   Pulse 89   Resp 18   Ht 6' (1.829 m)   Wt 222 lb 9.6 oz (101 kg)   BMI 30.19  kg/m   Past Medical History:  Diagnosis Date   Allergy    Bilateral undescended testicles    Cancer ([place])    melanoma on forehead   Cataract    OU   Diabetes mellitus without complication (HCC)    Dysrhythmia    a fib   GERD (gastroesophageal reflux disease)    Gout    Hepatitis    polycystic    Hypertension    Hypokalemia    Mixed hyperlipidemia    Polycystic kidney    Vitamin D deficiency     Social History   Socioeconomic History   Marital status: Single    Spouse name: Not on file   Number of children: 0   Years of education: Not on file   Highest education level: Associate degree: occupational, Hotel manager, or vocational program  Occupational History   Occupation: retired  Tobacco Use   Smoking status: Never   Smokeless tobacco: Never  Vaping Use   Vaping Use: Never used  Substance and Sexual Activity   Alcohol use: No   Drug use: No  Sexual activity: Never  Other Topics Concern   Not on file  Social History Narrative   Never married, no children, lives alone   Social Determinants of Health   Financial Resource Strain: Low Risk  (01/14/2022)   Overall Financial Resource Strain (CARDIA)    Difficulty of Paying Living Expenses: Not hard at all  Food Insecurity: No Food Insecurity (01/14/2022)   Hunger Vital Sign    Worried About Running Out of Food in the Last Year: Never true    Ran Out of Food in the Last Year: Never true  Transportation Needs: No Transportation Needs (01/14/2022)   PRAPARE - Hydrologist (Medical): No    Lack of Transportation (Non-Medical): No  Physical Activity: Sufficiently Active (01/14/2022)   Exercise Vital Sign    Days of Exercise per Week: 5 days    Minutes of Exercise per Session: 30 min  Stress: No Stress Concern Present (01/14/2022)   [place]    Feeling of Stress : Not at all  Social Connections: Socially Isolated (01/14/2022)    Social Connection and Isolation Panel [NHANES]    Frequency of Communication with Friends and Family: More than three times a week    Frequency of Social Gatherings with Friends and Family: Once a week    Attends Religious Services: Never    Marine scientist or Organizations: No    Attends Archivist Meetings: Never    Marital Status: Never married  Intimate Partner Violence: Not At Risk (01/14/2022)   Humiliation, Afraid, Rape, and Kick questionnaire    Fear of Current or Ex-Partner: No    Emotionally Abused: No    Physically Abused: No    Sexually Abused: No    Past Surgical History:  Procedure Laterality Date   [place] Left 02/23/2020   Procedure: [place] (SINGLE STAGE);  Surgeon: Katha Cabal, MD;  Location: [facility] ORS;  Service: Vascular;  Laterality: Left;   COLONOSCOPY  2012   hemorrhoids   melanoma removal     forehead    Family History  Problem Relation Age of Onset   Diabetes Mother    Heart disease Father     Allergies  Allergen Reactions   Ciprofloxacin Other (See Comments)    hallucination   Pollen Extract Other (See Comments)    Sneezing, watery eyes, etc.   Caffeine Palpitations    palpatations       Latest Ref Rng & Units 09/12/2021   10:36 AM 02/16/2020    2:34 PM 01/09/2019   12:06 PM  CBC  WBC 3.4 - 10.8 x10E3/uL 5.9  6.2  7.1   Hemoglobin 13.0 - 17.7 g/dL 10.0  12.6  13.1   Hematocrit 37.5 - 51.0 % 30.8  37.1  38.2   Platelets 150 - 450 x10E3/uL 119  157  172       CMP     Component Value Date/Time   NA 141 01/09/2022 1048   K 4.9 01/09/2022 1048   CL 103 01/09/2022 1048   CO2 16 (L) 01/09/2022 1048   GLUCOSE 95 01/09/2022 1048   GLUCOSE 109 (H) 02/16/2020 1434   BUN 94 (HH) 01/09/2022 1048   CREATININE 8.17 (H) 01/09/2022 1048   CALCIUM 10.6 (H) 01/09/2022 1048   PROT 7.1 01/09/2022 1048   ALBUMIN 4.9 (H) 01/09/2022 1048   AST 18 01/09/2022 1048   ALT 19 01/09/2022 1048  ALKPHOS 62 01/09/2022 1048   BILITOT 0.6 01/09/2022 1048   GFRNONAA 8 (L) 02/16/2020 1434   GFRAA 9 (L) 02/16/2020 1434     No results found.     Assessment & Plan:   1. End stage renal disease ([place]) The patient currently has not yet begun dialysis however noninvasive studies today show that he currently does have adequate access for the left brachiobasilic transposition.  We will continue to follow the patient's dialysis access annually unless he begins dialysis in which case we will need to evaluate every 6 months.  2. Venous ulcer ([place]) The patient has significant swelling in the lower extremity with the development of small wounds and ulcerations.  The swelling is causing him to have delayed wound healing.  We discussed how in order to heal these wounds he needs compression.  Under wraps would be helpful for the patient however he does not wish to wear these at this time.  I suggested use of bandages with medical grade compression stockings.  They should be placed first thing in the morning and remove before bedtime.  The patient should also elevate his lower extremity when not active.  Overall, the patient is end-stage renal dialysis levels with his kidney function and his swelling overall likely will improve drastically until he begins dialysis.  3. Essential hypertension Continue antihypertensive medications as already ordered, these medications have been reviewed and there are no changes at this time.   4. Mixed hyperlipidemia Continue statin as ordered and reviewed, no changes at this time    Current Outpatient Medications on File Prior to Visit  Medication Sig Dispense Refill   allopurinol (ZYLOPRIM) 100 MG tablet Take 1 tablet (100 mg total) by mouth daily. 90 tablet 0   amLODipine (NORVASC) 10 MG tablet TAKE 1 TABLET DAILY 90 tablet 1   apixaban (ELIQUIS) 2.5 MG TABS tablet Take 1 tablet (2.5 mg total) by mouth 2 (two) times daily. 60 tablet 11   blood glucose meter kit  and supplies Dispense based on patient and insurance preference. Use up to four times daily as directed. (FOR ICD-10 E11.9). 1 each 0   Cholecalciferol 50 MCG (2000 UT) CAPS Take 2,000 Units by mouth daily.      cyanocobalamin 1000 MCG tablet Take 1,000 mcg by mouth daily.     FERROUS GLUCONATE IRON PO Take by mouth.     furosemide (LASIX) 20 MG tablet Take 20 mg by mouth daily.     Insulin Pen Needle (NOVOFINE PEN NEEDLE) 32G X 6 MM MISC USE AND DISCARD 1 PEN      NEEDLE SUBCUTANEOUSLY      DAILY AS DIRECTED 100 each 1   Lancets (ONETOUCH DELICA PLUS [id number]) MISC USE AS DIRECTED EVERY DAY     liraglutide (VICTOZA) 18 MG/3ML SOPN INJECT 1.8MG SUBCUTANEOUSLYONCE DAILY 27 mL 1   loratadine (CLARITIN) 10 MG tablet Take 10 mg by mouth at bedtime. otc     lovastatin (MEVACOR) 20 MG tablet Take 1 tablet (20 mg total) by mouth daily. 90 tablet 1   metoprolol succinate (TOPROL-XL) 100 MG 24 hr tablet TAKE 1 TABLET DAILY WITH OR IMMEDIATELY FOLLOWING A MEAL 90 tablet 1   ONETOUCH ULTRA test strip ONE TEST DAILY 100 strip 0   pantoprazole (PROTONIX) 40 MG tablet Take 1 tablet (40 mg total) by mouth daily. 90 tablet 1   spironolactone (ALDACTONE) 25 MG tablet Take 25 mg by mouth daily.     No current facility-administered medications on  file prior to visit.    There are no Patient Instructions on file for this visit. No follow-ups on file.   Kris Hartmann, NP

## 2022-05-20 ENCOUNTER — Telehealth: Payer: Self-pay | Admitting: Family Medicine

## 2022-05-20 NOTE — Telephone Encounter (Signed)
Patient states he received an email from [facility] stating Insulin Pen Needle (NOVOFINE PEN NEEDLE) 32G X 6 MM MISC are on hold and it dont give a reason why, patient wanted to inform PCP

## 2022-05-21 ENCOUNTER — Other Ambulatory Visit: Payer: Self-pay

## 2022-05-21 DIAGNOSIS — E1121 Type 2 diabetes mellitus with diabetic nephropathy: Secondary | ICD-10-CM

## 2022-05-21 MED ORDER — NOVOFINE PEN NEEDLE 32G X 6 MM MISC: 0 refills | Status: DC

## 2022-05-22 DIAGNOSIS — I10 Essential (primary) hypertension: Secondary | ICD-10-CM | POA: Diagnosis not present

## 2022-05-22 DIAGNOSIS — N185 Chronic kidney disease, stage 5: Secondary | ICD-10-CM | POA: Diagnosis not present

## 2022-05-22 DIAGNOSIS — E876 Hypokalemia: Secondary | ICD-10-CM | POA: Diagnosis not present

## 2022-05-22 DIAGNOSIS — Q613 Polycystic kidney, unspecified: Secondary | ICD-10-CM | POA: Diagnosis not present

## 2022-05-22 DIAGNOSIS — M1A9XX0 Chronic gout, unspecified, without tophus (tophi): Secondary | ICD-10-CM | POA: Diagnosis not present

## 2022-05-22 DIAGNOSIS — N2581 Secondary hyperparathyroidism of renal origin: Secondary | ICD-10-CM | POA: Diagnosis not present

## 2022-05-22 DIAGNOSIS — N184 Chronic kidney disease, stage 4 (severe): Secondary | ICD-10-CM | POA: Diagnosis not present

## 2022-05-22 DIAGNOSIS — D631 Anemia in chronic kidney disease: Secondary | ICD-10-CM | POA: Diagnosis not present

## 2022-05-22 DIAGNOSIS — R252 Cramp and spasm: Secondary | ICD-10-CM | POA: Diagnosis not present

## 2022-05-27 ENCOUNTER — Other Ambulatory Visit: Payer: Self-pay | Admitting: Family Medicine

## 2022-05-27 DIAGNOSIS — E1121 Type 2 diabetes mellitus with diabetic nephropathy: Secondary | ICD-10-CM

## 2022-05-27 DIAGNOSIS — E11319 Type 2 diabetes mellitus with unspecified diabetic retinopathy without macular edema: Secondary | ICD-10-CM | POA: Diagnosis not present

## 2022-05-27 LAB — HM DIABETES EYE EXAM

## 2022-05-27 NOTE — Telephone Encounter (Signed)
Copied from [place] [id number]. Topic: General - Other >> May 27, 2022  1:48 PM Everette C wrote: Reason for CRM: Medication Refill - Medication:Insulin Pen Needle (NOVOFINE PEN NEEDLE) 32G X 6 MM MISC [[id number]]  Has the patient contacted their pharmacy? Yes.   (Agent: If no, request that the patient contact the pharmacy for the refill. If patient does not wish to contact the pharmacy document the reason why and proceed with request.) (Agent: If yes, when and what did the pharmacy advise?)  Preferred Pharmacy (with phone number or street name): [facility] [place] [place] [facility] [address] [place] [address] Phone: [phone] Fax: [phone] Hours: Not open 24 hour   Has the patient been seen for an appointment in the last year OR does the patient have an upcoming appointment? Yes.    Agent: Please be advised that RX refills may take up to 3 business days. We ask that you follow-up with your pharmacy.

## 2022-05-27 NOTE — Telephone Encounter (Signed)
Unable to refill per protocol, Rx was refilled 05/21/22 for 100, request is too soon.E-Prescribing Status: Receipt confirmed by pharmacy (05/21/2022  7:18 AM EDT). Will refuse.  Requested Prescriptions  Pending Prescriptions Disp Refills   Insulin Pen Needle (NOVOFINE PEN NEEDLE) 32G X 6 MM MISC 100 each 0    Sig: USE AND DISCARD 1 PEN      NEEDLE SUBCUTANEOUSLY      DAILY AS DIRECTED     Endocrinology: Diabetes - Testing Supplies Passed - 05/27/2022  2:15 PM      Passed - Valid encounter within last 12 months    Recent Outpatient Visits           2 weeks ago Type 2 diabetes mellitus with diabetic nephropathy, without long-term current use of insulin ([place])   [place] [facility] at [place], Deanna C, MD   1 month ago Venous insufficiency   [place] [facility] at [facility], MD   4 months ago Type 2 diabetes mellitus with diabetic nephropathy, without long-term current use of insulin ([place])   [place] [facility] at [facility], MD   8 months ago Type 2 diabetes mellitus with diabetic nephropathy, without long-term current use of insulin ([place])   [place] [facility] at [place], Deanna C, MD   1 year ago Chronic atrial fibrillation [facility])    Primary Care and Sports Medicine at [place], [place], MD       Future Appointments             In 3 months Juline Patch, MD [facility] at [facility], [facility]

## 2022-06-03 DIAGNOSIS — Q613 Polycystic kidney, unspecified: Secondary | ICD-10-CM | POA: Diagnosis not present

## 2022-06-03 DIAGNOSIS — D631 Anemia in chronic kidney disease: Secondary | ICD-10-CM | POA: Diagnosis not present

## 2022-06-03 DIAGNOSIS — E876 Hypokalemia: Secondary | ICD-10-CM | POA: Diagnosis not present

## 2022-06-03 DIAGNOSIS — N2581 Secondary hyperparathyroidism of renal origin: Secondary | ICD-10-CM | POA: Diagnosis not present

## 2022-06-03 DIAGNOSIS — R252 Cramp and spasm: Secondary | ICD-10-CM | POA: Diagnosis not present

## 2022-06-03 DIAGNOSIS — M1A9XX0 Chronic gout, unspecified, without tophus (tophi): Secondary | ICD-10-CM | POA: Diagnosis not present

## 2022-06-03 DIAGNOSIS — I10 Essential (primary) hypertension: Secondary | ICD-10-CM | POA: Diagnosis not present

## 2022-06-03 DIAGNOSIS — N184 Chronic kidney disease, stage 4 (severe): Secondary | ICD-10-CM | POA: Diagnosis not present

## 2022-06-03 DIAGNOSIS — N185 Chronic kidney disease, stage 5: Secondary | ICD-10-CM | POA: Diagnosis not present

## 2022-06-06 ENCOUNTER — Other Ambulatory Visit: Payer: Self-pay | Admitting: Family Medicine

## 2022-06-06 DIAGNOSIS — E790 Hyperuricemia without signs of inflammatory arthritis and tophaceous disease: Secondary | ICD-10-CM

## 2022-06-08 ENCOUNTER — Ambulatory Visit (INDEPENDENT_AMBULATORY_CARE_PROVIDER_SITE_OTHER): Payer: Medicare Other | Admitting: Vascular Surgery

## 2022-06-08 ENCOUNTER — Encounter (INDEPENDENT_AMBULATORY_CARE_PROVIDER_SITE_OTHER): Payer: Medicare Other

## 2022-06-08 ENCOUNTER — Encounter (INDEPENDENT_AMBULATORY_CARE_PROVIDER_SITE_OTHER): Payer: Self-pay

## 2022-06-10 ENCOUNTER — Ambulatory Visit (INDEPENDENT_AMBULATORY_CARE_PROVIDER_SITE_OTHER): Payer: Medicare Other | Admitting: Nurse Practitioner

## 2022-06-10 ENCOUNTER — Encounter (INDEPENDENT_AMBULATORY_CARE_PROVIDER_SITE_OTHER): Payer: Self-pay | Admitting: Nurse Practitioner

## 2022-06-10 VITALS — BP 135/77 | HR 99 | Ht 72.0 in | Wt 225.0 lb

## 2022-06-10 DIAGNOSIS — I10 Essential (primary) hypertension: Secondary | ICD-10-CM

## 2022-06-10 DIAGNOSIS — N186 End stage renal disease: Secondary | ICD-10-CM | POA: Diagnosis not present

## 2022-06-10 DIAGNOSIS — L97909 Non-pressure chronic ulcer of unspecified part of unspecified lower leg with unspecified severity: Secondary | ICD-10-CM | POA: Diagnosis not present

## 2022-06-10 DIAGNOSIS — I83009 Varicose veins of unspecified lower extremity with ulcer of unspecified site: Secondary | ICD-10-CM

## 2022-06-10 DIAGNOSIS — I890 Lymphedema, not elsewhere classified: Secondary | ICD-10-CM

## 2022-06-10 NOTE — Progress Notes (Signed)
Subjective:    Patient ID: Mark Bauer, male    DOB: 1955-11-29, 66 y.o.   MRN: [id number] No chief complaint on file.   Mark Bauer is a 66 year old male that returns today for 1 year evaluation of his hemodialysis access.  The patient has not yet begun dialysis.  His most recent creatinine in July was 7.6 with a GFR 7.  He previously had some significant lower extremity edema with open wounds.  Since the patient's last visit he began to utilize medical grade compression stockings daily.  Today all wounds are healed.  The swelling is drastically improved.  Currently there is no weeping of the ulcerated areas.      Review of Systems  Cardiovascular:  Negative for leg swelling.  Skin:  Negative for wound.  All other systems reviewed and are negative.      Objective:   Physical Exam Vitals reviewed.  HENT:     Head: Normocephalic.  Cardiovascular:     Rate and Rhythm: Normal rate.  Pulmonary:     Effort: Pulmonary effort is normal.  Skin:    General: Skin is warm and dry.  Neurological:     Mental Status: He is alert and oriented to person, place, and time.  Psychiatric:        Mood and Affect: Mood normal.        Behavior: Behavior normal.        Thought Content: Thought content normal.        Judgment: Judgment normal.     BP 135/77 (BP Location: Right Arm)   Pulse 99   Ht 6' (1.829 m)   Wt 225 lb (102.1 kg)   SpO2 (!) 17%   BMI 30.52 kg/m   Past Medical History:  Diagnosis Date   Allergy    Bilateral undescended testicles    Cancer ([place])    melanoma on forehead   Cataract    OU   Diabetes mellitus without complication (HCC)    Dysrhythmia    a fib   GERD (gastroesophageal reflux disease)    Gout    Hepatitis    polycystic    Hypertension    Hypokalemia    Mixed hyperlipidemia    Polycystic kidney    Vitamin D deficiency     Social History   Socioeconomic History   Marital status: Single    Spouse name: Not on file   Number of  children: 0   Years of education: Not on file   Highest education level: Associate degree: occupational, Hotel manager, or vocational program  Occupational History   Occupation: retired  Tobacco Use   Smoking status: Never   Smokeless tobacco: Never  Vaping Use   Vaping Use: Never used  Substance and Sexual Activity   Alcohol use: No   Drug use: No   Sexual activity: Never  Other Topics Concern   Not on file  Social History Narrative   Never married, no children, lives alone   Social Determinants of Health   Financial Resource Strain: Low Risk  (01/14/2022)   Overall Financial Resource Strain (CARDIA)    Difficulty of Paying Living Expenses: Not hard at all  Food Insecurity: No Food Insecurity (01/14/2022)   Hunger Vital Sign    Worried About Running Out of Food in the Last Year: Never true    Ran Out of Food in the Last Year: Never true  Transportation Needs: No Transportation Needs (01/14/2022)   PRAPARE - Transportation  Lack of Transportation (Medical): No    Lack of Transportation (Non-Medical): No  Physical Activity: Sufficiently Active (01/14/2022)   Exercise Vital Sign    Days of Exercise per Week: 5 days    Minutes of Exercise per Session: 30 min  Stress: No Stress Concern Present (01/14/2022)   [place]    Feeling of Stress : Not at all  Social Connections: Socially Isolated (01/14/2022)   Social Connection and Isolation Panel [NHANES]    Frequency of Communication with Friends and Family: More than three times a week    Frequency of Social Gatherings with Friends and Family: Once a week    Attends Religious Services: Never    Marine scientist or Organizations: No    Attends Archivist Meetings: Never    Marital Status: Never married  Intimate Partner Violence: Not At Risk (01/14/2022)   Humiliation, Afraid, Rape, and Kick questionnaire    Fear of Current or Ex-Partner: No     Emotionally Abused: No    Physically Abused: No    Sexually Abused: No    Past Surgical History:  Procedure Laterality Date   [place] Left 02/23/2020   Procedure: [place] (SINGLE STAGE);  Surgeon: Katha Cabal, MD;  Location: [facility] ORS;  Service: Vascular;  Laterality: Left;   COLONOSCOPY  2012   hemorrhoids   melanoma removal     forehead    Family History  Problem Relation Age of Onset   Diabetes Mother    Heart disease Father     Allergies  Allergen Reactions   Ciprofloxacin Other (See Comments)    hallucination   Pollen Extract Other (See Comments)    Sneezing, watery eyes, etc.   Caffeine Palpitations    palpatations       Latest Ref Rng & Units 09/12/2021   10:36 AM 02/16/2020    2:34 PM 01/09/2019   12:06 PM  CBC  WBC 3.4 - 10.8 x10E3/uL 5.9  6.2  7.1   Hemoglobin 13.0 - 17.7 g/dL 10.0  12.6  13.1   Hematocrit 37.5 - 51.0 % 30.8  37.1  38.2   Platelets 150 - 450 x10E3/uL 119  157  172       CMP     Component Value Date/Time   NA 141 01/09/2022 1048   K 4.9 01/09/2022 1048   CL 103 01/09/2022 1048   CO2 16 (L) 01/09/2022 1048   GLUCOSE 95 01/09/2022 1048   GLUCOSE 109 (H) 02/16/2020 1434   BUN 94 (HH) 01/09/2022 1048   CREATININE 8.17 (H) 01/09/2022 1048   CALCIUM 10.6 (H) 01/09/2022 1048   PROT 7.1 01/09/2022 1048   ALBUMIN 4.9 (H) 01/09/2022 1048   AST 18 01/09/2022 1048   ALT 19 01/09/2022 1048   ALKPHOS 62 01/09/2022 1048   BILITOT 0.6 01/09/2022 1048   GFRNONAA 8 (L) 02/16/2020 1434   GFRAA 9 (L) 02/16/2020 1434     No results found.     Assessment & Plan:   1. End stage renal disease [facility]) Patient recently had noninvasive studies which showed adequate access for dialysis.  Patient notes that he may be needing to start dialysis soon.  If this is true, the patient's upper extremity access is adequate for use currently.  We will continue to reevaluate his hemodialysis access in 6 months.  2.  Lymphedema The patient has been going to diligently wear medical grade compression stockings  and this is resolved and swelling very well.  Patient is advised to continue with medical grade compression, elevation and activity.  3. Venous ulcer ([place]) This has resolved  4. Essential hypertension Continue antihypertensive medications as already ordered, these medications have been reviewed and there are no changes at this time.   Current Outpatient Medications on File Prior to Visit  Medication Sig Dispense Refill   allopurinol (ZYLOPRIM) 100 MG tablet TAKE 1 TABLET BY MOUTH EVERY DAY 90 tablet 0   amLODipine (NORVASC) 10 MG tablet TAKE 1 TABLET DAILY 90 tablet 1   apixaban (ELIQUIS) 2.5 MG TABS tablet Take 1 tablet (2.5 mg total) by mouth 2 (two) times daily. 60 tablet 11   blood glucose meter kit and supplies Dispense based on patient and insurance preference. Use up to four times daily as directed. (FOR ICD-10 E11.9). 1 each 0   Cholecalciferol 50 MCG (2000 UT) CAPS Take 2,000 Units by mouth daily.      cyanocobalamin 1000 MCG tablet Take 1,000 mcg by mouth daily.     FERROUS GLUCONATE IRON PO Take by mouth.     furosemide (LASIX) 20 MG tablet Take 20 mg by mouth daily.     Insulin Pen Needle (NOVOFINE PEN NEEDLE) 32G X 6 MM MISC USE AND DISCARD 1 PEN      NEEDLE SUBCUTANEOUSLY      DAILY AS DIRECTED 100 each 0   Lancets (ONETOUCH DELICA PLUS [id number]) MISC USE AS DIRECTED EVERY DAY     liraglutide (VICTOZA) 18 MG/3ML SOPN INJECT 1.8MG SUBCUTANEOUSLYONCE DAILY 27 mL 1   loratadine (CLARITIN) 10 MG tablet Take 10 mg by mouth at bedtime. otc     lovastatin (MEVACOR) 20 MG tablet Take 1 tablet (20 mg total) by mouth daily. 90 tablet 1   metoprolol succinate (TOPROL-XL) 100 MG 24 hr tablet TAKE 1 TABLET DAILY WITH OR IMMEDIATELY FOLLOWING A MEAL 90 tablet 1   ONETOUCH ULTRA test strip ONE TEST DAILY 100 strip 0   pantoprazole (PROTONIX) 40 MG tablet Take 1 tablet (40 mg total) by mouth daily.  90 tablet 1   spironolactone (ALDACTONE) 25 MG tablet Take 25 mg by mouth daily.     No current facility-administered medications on file prior to visit.    There are no Patient Instructions on file for this visit. No follow-ups on file.   Kris Hartmann, NP

## 2022-06-11 ENCOUNTER — Encounter (INDEPENDENT_AMBULATORY_CARE_PROVIDER_SITE_OTHER): Payer: Self-pay | Admitting: Nurse Practitioner

## 2022-08-04 ENCOUNTER — Encounter: Payer: Self-pay | Admitting: Family Medicine

## 2022-08-04 ENCOUNTER — Telehealth: Payer: Self-pay

## 2022-08-04 NOTE — Telephone Encounter (Signed)
Pt called to ask what was going on with his victoza- I explained that I did the PA for it and the ins. Denied it. He isgoing to call the pharm and see if they can tell him anything else. If not, we will start Ozempic

## 2022-08-06 ENCOUNTER — Telehealth: Payer: Self-pay

## 2022-08-06 NOTE — Telephone Encounter (Signed)
Mark Bauer called and talked to Dr Ronnald Ramp today concerning the denial for pt's victoza. After speaking with Bauer- I gave her the number to insurance as well as pt's member ID to call them. She is going to try to call ins. To see if she can get somewhere with them. I called pt to ask him to give [place] a call tomorrow if he hasn't heard from [place] or Bauer.

## 2022-08-07 ENCOUNTER — Telehealth: Payer: Self-pay

## 2022-08-07 NOTE — Telephone Encounter (Signed)
Called pt- Mark Bauer has not yet reached pt concerning Victoza/ insurance PA- I will call pt on Monday to see if he has heard from someone.

## 2022-08-10 ENCOUNTER — Telehealth: Payer: Self-pay

## 2022-08-10 ENCOUNTER — Other Ambulatory Visit: Payer: Self-pay

## 2022-08-10 DIAGNOSIS — E1121 Type 2 diabetes mellitus with diabetic nephropathy: Secondary | ICD-10-CM

## 2022-08-10 MED ORDER — TRULICITY 0.75 MG/0.5ML ~~LOC~~ SOAJ: 0.7500 mg | SUBCUTANEOUS | 1 refills | Status: DC

## 2022-08-10 NOTE — Telephone Encounter (Signed)
After speaking to [facility] [place]/ nephrology- she stated she was unable to get insurance to approve the [place] as well. She has advised Jones to switch him over to Trulicity once a week. I sent in prescription and notified pt. We will see if insurance will pay for this. If not, we may have to send to endocrinology

## 2022-08-10 NOTE — Telephone Encounter (Signed)
I called Mark Bauer's office to see if she has heard anything from insurance company concerning pt's victoza. I had to leave a message with the receptionist. I will call Mark Bauer later to see if he has heard anything at the end of the day. As of right now, he has not

## 2022-08-20 ENCOUNTER — Other Ambulatory Visit: Payer: Self-pay | Admitting: Family Medicine

## 2022-08-20 DIAGNOSIS — I10 Essential (primary) hypertension: Secondary | ICD-10-CM

## 2022-08-20 NOTE — Telephone Encounter (Signed)
Requested Prescriptions  Pending Prescriptions Disp Refills   metoprolol succinate (TOPROL-XL) 100 MG 24 hr tablet [Pharmacy Med Name: METOPROLOL SUCC ER 100 MG TAB] 90 tablet 0    Sig: TAKE 1 TABLET DAILY WITH OR IMMEDIATELY FOLLOWING A MEAL     Cardiovascular:  Beta Blockers Passed - 08/20/2022  1:29 AM      Passed - Last BP in normal range    BP Readings from Last 1 Encounters:  06/10/22 135/77         Passed - Last Heart Rate in normal range    Pulse Readings from Last 1 Encounters:  06/10/22 99         Passed - Valid encounter within last 6 months    Recent Outpatient Visits           3 months ago Type 2 diabetes mellitus with diabetic nephropathy, without long-term current use of insulin ([place])   [place] [facility] at [place], Deanna C, MD   4 months ago Venous insufficiency   [place] [facility] at [facility], MD   7 months ago Type 2 diabetes mellitus with diabetic nephropathy, without long-term current use of insulin ([place])   [place] [facility] at [facility], MD   11 months ago Type 2 diabetes mellitus with diabetic nephropathy, without long-term current use of insulin ([place])   [place] [facility] at [place], [place], MD   1 year ago Chronic atrial fibrillation [facility])   [place] at [place], [place], MD       Future Appointments             In 3 weeks Juline Patch, MD [place] at [facility]

## 2022-09-02 ENCOUNTER — Other Ambulatory Visit: Payer: Self-pay | Admitting: Family Medicine

## 2022-09-02 DIAGNOSIS — N2581 Secondary hyperparathyroidism of renal origin: Secondary | ICD-10-CM | POA: Diagnosis not present

## 2022-09-02 DIAGNOSIS — N185 Chronic kidney disease, stage 5: Secondary | ICD-10-CM | POA: Diagnosis not present

## 2022-09-02 DIAGNOSIS — E782 Mixed hyperlipidemia: Secondary | ICD-10-CM

## 2022-09-02 NOTE — Telephone Encounter (Signed)
Requested medication (s) are due for refill today - yes  Requested medication (s) are on the active medication list -yes  Future visit scheduled yes  Last refill: 01/09/22 #90 1RF  Notes to clinic: fails lab protocol- over 1 year- 2021  Requested Prescriptions  Pending Prescriptions Disp Refills   lovastatin (MEVACOR) 20 MG tablet [Pharmacy Med Name: LOVASTATIN 20 MG TABLET] 90 tablet 1    Sig: TAKE 1 TABLET BY MOUTH EVERY DAY     Cardiovascular:  Antilipid - Statins 2 Failed - 09/02/2022  2:42 AM      Failed - Cr in normal range and within 360 days    Creatinine, Ser  Date Value Ref Range Status  01/09/2022 8.17 (H) 0.76 - 1.27 mg/dL Final         Failed - Lipid Panel in normal range within the last 12 months    Cholesterol, Total  Date Value Ref Range Status  05/02/2020 100 100 - 199 mg/dL Final   LDL Chol Calc (NIH)  Date Value Ref Range Status  05/02/2020 55 0 - 99 mg/dL Final   HDL  Date Value Ref Range Status  05/02/2020 34 (L) >39 mg/dL Final   Triglycerides  Date Value Ref Range Status  05/02/2020 42 0 - 149 mg/dL Final         Passed - Patient is not pregnant      Passed - Valid encounter within last 12 months    Recent Outpatient Visits           3 months ago Type 2 diabetes mellitus with diabetic nephropathy, without long-term current use of insulin ([place])   [place] [facility] at [place], Deanna C, MD   4 months ago Venous insufficiency   [place] [facility] at [facility], MD   7 months ago Type 2 diabetes mellitus with diabetic nephropathy, without long-term current use of insulin ([place])   [place] [facility] at [facility], MD   11 months ago Type 2 diabetes mellitus with diabetic nephropathy, without long-term current use of insulin ([place])   [place] [facility] at [place], Deanna C, MD   1  year ago Chronic atrial fibrillation [facility])    [facility] Medicine at [facility], MD       Future Appointments             In 1 week Juline Patch, MD [facility] at [facility], [facility]               Requested Prescriptions  Pending Prescriptions Disp Refills   lovastatin (MEVACOR) 20 MG tablet [Pharmacy Med Name: LOVASTATIN 20 MG TABLET] 90 tablet 1    Sig: TAKE 1 TABLET BY MOUTH EVERY DAY     Cardiovascular:  Antilipid - Statins 2 Failed - 09/02/2022  2:42 AM      Failed - Cr in normal range and within 360 days    Creatinine, Ser  Date Value Ref Range Status  01/09/2022 8.17 (H) 0.76 - 1.27 mg/dL Final         Failed - Lipid Panel in normal range within the last 12 months    Cholesterol, Total  Date Value Ref Range Status  05/02/2020 100 100 - 199 mg/dL Final   LDL Chol Calc (NIH)  Date Value Ref Range Status  05/02/2020 55 0 - 99 mg/dL Final   HDL  Date Value Ref Range Status  05/02/2020 34 (L) >39 mg/dL Final   Triglycerides  Date Value Ref Range Status  05/02/2020 42 0 - 149 mg/dL Final         Passed - Patient is not pregnant      Passed - Valid encounter within last 12 months    Recent Outpatient Visits           3 months ago Type 2 diabetes mellitus with diabetic nephropathy, without long-term current use of insulin ([place])   [place] [facility] at [place], Deanna C, MD   4 months ago Venous insufficiency   [place] [facility] at [facility], MD   7 months ago Type 2 diabetes mellitus with diabetic nephropathy, without long-term current use of insulin ([place])   [place] [facility] at [facility], MD   11 months ago Type 2 diabetes mellitus with diabetic nephropathy, without long-term current use of insulin ([place])    Primary [facility] & Sports  Medicine at [place], Deanna C, MD   1 year ago Chronic atrial fibrillation [facility])   [place] at [place], [place], MD       Future Appointments             In 1 week Juline Patch, MD [place] at [facility], [facility]

## 2022-09-03 ENCOUNTER — Other Ambulatory Visit: Payer: Self-pay | Admitting: Family Medicine

## 2022-09-03 DIAGNOSIS — E790 Hyperuricemia without signs of inflammatory arthritis and tophaceous disease: Secondary | ICD-10-CM

## 2022-09-03 NOTE — Telephone Encounter (Signed)
Requested medication (s) are due for refill today:   Yes  Requested medication (s) are on the active medication list:   Yes  Future visit scheduled:   Yes 09/14/2022   Last ordered: 06/08/2022 #90, 0 refills  Returned because uric acid level due per protocol.      Requested Prescriptions  Pending Prescriptions Disp Refills   allopurinol (ZYLOPRIM) 100 MG tablet [Pharmacy Med Name: ALLOPURINOL 100 MG TABLET] 90 tablet 0    Sig: TAKE 1 TABLET BY [place] DAY     Endocrinology:  Gout Agents - allopurinol Failed - 09/03/2022  1:27 AM      Failed - Uric Acid in normal range and within 360 days    No results found for: "POCURA", "LABURIC"       Failed - Cr in normal range and within 360 days    Creatinine, Ser  Date Value Ref Range Status  01/09/2022 8.17 (H) 0.76 - 1.27 mg/dL Final         Passed - Valid encounter within last 12 months    Recent Outpatient Visits           3 months ago Type 2 diabetes mellitus with diabetic nephropathy, without long-term current use of insulin ([place])   [place] [facility] at [place], Deanna C, MD   4 months ago Venous insufficiency   [place] [facility] at [place], Deanna C, MD   7 months ago Type 2 diabetes mellitus with diabetic nephropathy, without long-term current use of insulin ([place])   [place] [facility] at [place], Deanna C, MD   11 months ago Type 2 diabetes mellitus with diabetic nephropathy, without long-term current use of insulin ([place])   [place] [facility] at [place], Deanna C, MD   1 year ago Chronic atrial fibrillation [facility])   [place] at [place], Deanna C, MD       Future Appointments             In 1 week Juline Patch, MD [facility] at [facility] [place]  within normal limits and completed in the last 12 months    WBC  Date Value Ref Range Status  09/12/2021 5.9 3.4 - 10.8 x10E3/uL Final  02/16/2020 6.2 4.0 - 10.5 K/uL Final   RBC  Date Value Ref Range Status  09/12/2021 2.98 (L) 4.14 - 5.80 x10E6/uL Final  02/16/2020 3.92 (L) 4.22 - 5.81 MIL/uL Final   Hemoglobin  Date Value Ref Range Status  09/12/2021 10.0 (L) 13.0 - 17.7 g/dL Final   Hematocrit  Date Value Ref Range Status  09/12/2021 30.8 (L) 37.5 - 51.0 % Final   MCHC  Date Value Ref Range Status  09/12/2021 32.5 31.5 - 35.7 g/dL Final  02/16/2020 34.0 30.0 - 36.0 g/dL Final   [facility]  Date Value Ref Range Status  09/12/2021 33.6 (H) 26.6 - 33.0 pg Final  02/16/2020 32.1 26.0 - 34.0 pg Final   MCV  Date Value Ref Range Status  09/12/2021 103 (H) 79 - 97 fL Final   No results found for: "PLTCOUNTKUC", "LABPLAT", "POCPLA" RDW  Date Value Ref Range Status  09/12/2021 14.1 11.6 - 15.4 % Final

## 2022-09-14 ENCOUNTER — Other Ambulatory Visit: Payer: Self-pay

## 2022-09-14 ENCOUNTER — Ambulatory Visit: Payer: Self-pay

## 2022-09-14 ENCOUNTER — Ambulatory Visit (INDEPENDENT_AMBULATORY_CARE_PROVIDER_SITE_OTHER): Payer: Medicare Other | Admitting: Family Medicine

## 2022-09-14 ENCOUNTER — Encounter: Payer: Self-pay | Admitting: Family Medicine

## 2022-09-14 VITALS — BP 110/60 | HR 88 | Ht 72.0 in | Wt 285.0 lb

## 2022-09-14 DIAGNOSIS — E1121 Type 2 diabetes mellitus with diabetic nephropathy: Secondary | ICD-10-CM

## 2022-09-14 DIAGNOSIS — R609 Edema, unspecified: Secondary | ICD-10-CM | POA: Diagnosis not present

## 2022-09-14 DIAGNOSIS — N184 Chronic kidney disease, stage 4 (severe): Secondary | ICD-10-CM | POA: Diagnosis not present

## 2022-09-14 DIAGNOSIS — E1122 Type 2 diabetes mellitus with diabetic chronic kidney disease: Secondary | ICD-10-CM

## 2022-09-14 DIAGNOSIS — K219 Gastro-esophageal reflux disease without esophagitis: Secondary | ICD-10-CM

## 2022-09-14 DIAGNOSIS — E782 Mixed hyperlipidemia: Secondary | ICD-10-CM | POA: Diagnosis not present

## 2022-09-14 DIAGNOSIS — I10 Essential (primary) hypertension: Secondary | ICD-10-CM

## 2022-09-14 DIAGNOSIS — E790 Hyperuricemia without signs of inflammatory arthritis and tophaceous disease: Secondary | ICD-10-CM

## 2022-09-14 DIAGNOSIS — R635 Abnormal weight gain: Secondary | ICD-10-CM | POA: Diagnosis not present

## 2022-09-14 MED ORDER — PANTOPRAZOLE SODIUM 40 MG PO TBEC: 40.0000 mg | DELAYED_RELEASE_TABLET | Freq: Every day | ORAL | 1 refills | Status: DC

## 2022-09-14 MED ORDER — METOPROLOL SUCCINATE ER 100 MG PO TB24: ORAL_TABLET | ORAL | 1 refills | Status: DC

## 2022-09-14 MED ORDER — ALLOPURINOL 100 MG PO TABS: 100.0000 mg | ORAL_TABLET | Freq: Every day | ORAL | 1 refills | Status: DC

## 2022-09-14 MED ORDER — LOVASTATIN 20 MG PO TABS: 20.0000 mg | ORAL_TABLET | Freq: Every day | ORAL | 1 refills | Status: DC

## 2022-09-14 MED ORDER — FUROSEMIDE 20 MG PO TABS: 20.0000 mg | ORAL_TABLET | Freq: Every day | ORAL | 0 refills | Status: DC

## 2022-09-14 MED ORDER — TRULICITY 0.75 MG/0.5ML ~~LOC~~ SOAJ: 0.7500 mg | SUBCUTANEOUS | 1 refills | Status: DC

## 2022-09-14 MED ORDER — AMLODIPINE BESYLATE 10 MG PO TABS: 10.0000 mg | ORAL_TABLET | Freq: Every day | ORAL | 1 refills | Status: DC

## 2022-09-14 NOTE — Telephone Encounter (Signed)
  Pt stated he saw PCP today and Dr.Jones and the nurse thought he wasn't taking the medication furosemide (LASIX) 20 MG tablet was told to double it when he got home; however, he is taking it. He stated that the one he is not taking is medication torsemide (DEMADEX) 10 MG tablet he said directions, saying to take as needed.      Chief Complaint: Pt. Seen today - thought he had not been taking his Lasix, but he has been taking it. States he has not been taking the Torsemide. Please advise. Symptoms: n/a Frequency: n/a Pertinent Negatives: Patient denies n/a Disposition: []$ ED /[]$ Urgent Care (no appt availability in office) / []$ Appointment(In office/virtual)/ []$  [facility]/ []$ Home Care/ []$ Refused Recommended Disposition /[]$ [facility]/ [x]$  Follow-up with PCP Additional Notes: Please advise pt.  Answer Assessment - Initial Assessment Questions 1. NAME of MEDICINE: "What medicine(s) are you calling about?"     Has been taking Lasix 1 pill daily. Has NOT been taken Torsemide. 2. QUESTION: "What is your question?" (e.g., double dose of medicine, side effect)     Pt. Thought he had not been taking his Lasix. 3. PRESCRIBER: "Who prescribed the medicine?" Reason: if prescribed by specialist, call should be referred to that group.     Dr. Ronnald Ramp 4. SYMPTOMS: "Do you have any symptoms?" If Yes, ask: "What symptoms are you having?"  "How bad are the symptoms (e.g., mild, moderate, severe)     N/a 5. PREGNANCY:  "Is there any chance that you are pregnant?" "When was your last menstrual period?"     N/a  Protocols used: Medication Question Call-A-AH

## 2022-09-14 NOTE — Progress Notes (Signed)
Date:  09/14/2022   Name:  Mark Bauer   DOB:  06-12-1956   MRN:  [id number]   Chief Complaint: Gout, Hypertension, Hyperlipidemia, Gastroesophageal Reflux, Diabetes, Cough ([place]), and Shortness of Breath  Hypertension This is a chronic problem. The current episode started more than 1 year ago. The problem has been waxing and waning since onset. The problem is controlled. Associated symptoms include peripheral edema and shortness of breath. Pertinent negatives include no anxiety, blurred vision, chest pain, orthopnea, palpitations or PND. There are no associated agents to hypertension. Past treatments include diuretics, beta blockers and calcium channel blockers. The current treatment provides moderate improvement. Hypertensive end-organ damage includes heart failure. There is no history of angina, kidney disease, CAD/MI, CVA, left ventricular hypertrophy, PVD or retinopathy. There is no history of chronic renal disease, a hypertension causing med or renovascular disease.  Hyperlipidemia This is a chronic problem. The current episode started more than 1 year ago. The problem is controlled. Recent lipid tests were reviewed and are normal. He has no history of chronic renal disease, diabetes, hypothyroidism, liver disease, obesity or nephrotic syndrome. Associated symptoms include shortness of breath. Pertinent negatives include no chest pain. Current antihyperlipidemic treatment includes statins. The current treatment provides moderate improvement of lipids. There are no compliance problems.  Risk factors for coronary artery disease include dyslipidemia, diabetes mellitus and hypertension.  Gastroesophageal Reflux He complains of coughing and heartburn. He reports no chest pain or no wheezing. This is a chronic problem. The problem has been gradually improving. The symptoms are aggravated by certain foods. He has tried a PPI for the symptoms. The treatment provided moderate relief.   Diabetes He presents for his follow-up diabetic visit. He has type 2 diabetes mellitus. His disease course has been stable. Pertinent negatives for diabetes include no blurred vision, no chest pain, no polydipsia and no polyuria. Symptoms are worsening. Pertinent negatives for diabetic complications include no CVA, PVD or retinopathy. His weight is stable. He is following a generally healthy diet. Meal planning includes avoidance of concentrated sweets and carbohydrate counting. His breakfast blood glucose range is generally 90-110 mg/dl.  Cough This is a new problem. The current episode started more than 1 month ago. The problem has been gradually improving. The cough is Productive of sputum. Associated symptoms include heartburn and shortness of breath. Pertinent negatives include no chest pain or wheezing.  Shortness of Breath This is a chronic problem. The current episode started more than 1 month ago. The problem occurs intermittently. The problem has been gradually worsening. Associated symptoms include leg swelling. Pertinent negatives include no chest pain, orthopnea, PND, sputum production or wheezing. He has tried nothing for the symptoms. His past medical history is significant for a heart failure.    Lab Results  Component Value Date   NA 141 01/09/2022   K 4.9 01/09/2022   CO2 16 (L) 01/09/2022   GLUCOSE 95 01/09/2022   BUN 94 (HH) 01/09/2022   CREATININE 8.17 (H) 01/09/2022   CALCIUM 10.6 (H) 01/09/2022   EGFR 7 (L) 01/09/2022   GFRNONAA 8 (L) 02/16/2020   Lab Results  Component Value Date   CHOL 100 05/02/2020   HDL 34 (L) 05/02/2020   LDLCALC 55 05/02/2020   TRIG 42 05/02/2020   CHOLHDL 3.7 12/16/2016   Lab Results  Component Value Date   TSH 3.040 01/09/2019   Lab Results  Component Value Date   HGBA1C 5.5 05/12/2022   Lab Results  Component Value  Date   WBC 5.9 09/12/2021   HGB 10.0 (L) 09/12/2021   HCT 30.8 (L) 09/12/2021   MCV 103 (H) 09/12/2021   PLT  119 (L) 09/12/2021   Lab Results  Component Value Date   ALT 19 01/09/2022   AST 18 01/09/2022   ALKPHOS 62 01/09/2022   BILITOT 0.6 01/09/2022   No results found for: "25OHVITD2", "25OHVITD3", "VD25OH"   Review of Systems  Constitutional:  Positive for unexpected weight change.       55 lb weight gain  Eyes:  Negative for blurred vision.  Respiratory:  Positive for cough and shortness of breath. Negative for apnea, sputum production, chest tightness, wheezing and stridor.   Cardiovascular:  Positive for leg swelling. Negative for chest pain, palpitations, orthopnea and PND.  Gastrointestinal:  Positive for heartburn.  Endocrine: Negative for polydipsia and polyuria.    Patient Active Problem List   Diagnosis Date Noted   End stage renal disease ([place]) 06/04/2021   Anticoagulated [id number]   Complication of vascular access for dialysis 06/06/2020   Fistula 02/23/2020   Polycystic liver disease 01/10/2020   Polycystic kidney disease [id number]   Metabolic acidosis [id number]   CKD stage 4 due to type 2 diabetes mellitus ([place]) 01/25/2018   Type 2 diabetes mellitus with diabetic nephropathy, without long-term current use of insulin ([place]) 01/25/2018   Secondary hyperparathyroidism of renal origin ([place]) 08/19/2017   Chronic atrial fibrillation ([place]) 01/06/2017   Essential hypertension 01/06/2017   Hyperuricemia 01/06/2017   Mixed hyperlipidemia 01/06/2017   Gastroesophageal reflux disease 01/06/2017   Obesity 12/26/2012    Allergies  Allergen Reactions   Ciprofloxacin Other (See Comments)    hallucination   Pollen Extract Other (See Comments)    Sneezing, watery eyes, etc.   Caffeine Palpitations    palpatations    Past Surgical History:  Procedure Laterality Date   BASCILIC VEIN TRANSPOSITION Left 02/23/2020   Procedure: BASCILIC VEIN TRANSPOSITION (SINGLE STAGE);  Surgeon: Katha Cabal, MD;  Location: [facility] ORS;  Service: Vascular;  Laterality: Left;    COLONOSCOPY  2012   hemorrhoids   melanoma removal     forehead    Social History   Tobacco Use   Smoking status: Never   Smokeless tobacco: Never  Vaping Use   Vaping Use: Never used  Substance Use Topics   Alcohol use: No   Drug use: No     Medication list has been reviewed and updated.  Current Meds  Medication Sig   allopurinol (ZYLOPRIM) 100 MG tablet TAKE 1 TABLET BY MOUTH EVERY DAY   amLODipine (NORVASC) 10 MG tablet TAKE 1 TABLET DAILY   apixaban (ELIQUIS) 2.5 MG TABS tablet Take 1 tablet (2.5 mg total) by mouth 2 (two) times daily.   blood glucose meter kit and supplies Dispense based on patient and insurance preference. Use up to four times daily as directed. (FOR ICD-10 E11.9).   Cholecalciferol 50 MCG (2000 UT) CAPS Take 2,000 Units by mouth daily.    cyanocobalamin 1000 MCG tablet Take 1,000 mcg by mouth daily.   Dulaglutide (TRULICITY) A999333 0000000 SOPN Inject 0.75 mg into the skin once a week.   FERROUS GLUCONATE IRON PO Take by mouth.   Insulin Pen Needle (NOVOFINE PEN NEEDLE) 32G X 6 MM MISC USE AND DISCARD 1 PEN      NEEDLE SUBCUTANEOUSLY      DAILY AS DIRECTED   Lancets (ONETOUCH DELICA PLUS Q000111Q) MISC USE AS DIRECTED EVERY DAY   loratadine (  CLARITIN) 10 MG tablet Take 10 mg by mouth at bedtime. otc   lovastatin (MEVACOR) 20 MG tablet TAKE 1 TABLET BY MOUTH EVERY DAY   metoprolol succinate (TOPROL-XL) 100 MG 24 hr tablet TAKE 1 TABLET DAILY WITH OR IMMEDIATELY FOLLOWING A MEAL   ONETOUCH ULTRA test strip ONE TEST DAILY   pantoprazole (PROTONIX) 40 MG tablet Take 1 tablet (40 mg total) by mouth daily.   spironolactone (ALDACTONE) 25 MG tablet Take 25 mg by mouth daily.       09/14/2022    9:48 AM 05/12/2022   10:06 AM 04/20/2022    9:21 AM 01/09/2022   10:18 AM  GAD 7 : Generalized Anxiety Score  Nervous, Anxious, on Edge 0 0 0 0  Control/stop worrying 0 1 0 0  Worry too much - different things 0 1 0 0  Trouble relaxing 0 0 0 0  Restless 0 0 0  0  Easily annoyed or irritable 0 0 0 0  Afraid - awful might happen 0 0 0 0  Total GAD 7 Score 0 2 0 0  Anxiety Difficulty Not difficult at all Not difficult at all Not difficult at all Not difficult at all       09/14/2022    9:48 AM 05/12/2022   10:05 AM 04/20/2022    9:20 AM  Depression screen PHQ 2/9  Decreased Interest 0 0 0  Down, Depressed, Hopeless 0 1 0  PHQ - 2 Score 0 1 0  Altered sleeping 0 0 0  Tired, decreased energy 0 0 0  Change in appetite 0 0 0  Feeling bad or failure about yourself  0 1 0  Trouble concentrating 0 0 0  Moving slowly or fidgety/restless 0 0 0  Suicidal thoughts 0 0 0  PHQ-9 Score 0 2 0  Difficult doing work/chores Not difficult at all Not difficult at all Not difficult at all    BP Readings from Last 3 Encounters:  09/14/22 110/60  06/10/22 135/77  05/13/22 113/71    Physical Exam Vitals and nursing note reviewed.  HENT:     Head: Normocephalic.     Right Ear: External ear normal.     Left Ear: External ear normal.     Nose: Nose normal.  Eyes:     General: No scleral icterus.       Right eye: No discharge.        Left eye: No discharge.     Conjunctiva/sclera: Conjunctivae normal.     Pupils: Pupils are equal, round, and reactive to light.  Neck:     Thyroid: No thyromegaly.     Vascular: No hepatojugular reflux or JVD.     Trachea: No tracheal deviation.  Cardiovascular:     Rate and Rhythm: Normal rate and regular rhythm.     Heart sounds: Normal heart sounds, S1 normal and S2 normal. No murmur heard.    No systolic murmur is present.     No diastolic murmur is present.     No friction rub. No gallop. No S3 or S4 sounds.  Pulmonary:     Effort: No respiratory distress.     Breath sounds: No stridor. Examination of the right-lower field reveals decreased breath sounds. Examination of the left-lower field reveals decreased breath sounds. Decreased breath sounds present. No wheezing, rhonchi or rales.  Abdominal:     General:  Bowel sounds are normal.     Palpations: Abdomen is soft. There is no mass.  Tenderness: There is no abdominal tenderness. There is no guarding or rebound.  Musculoskeletal:        General: No tenderness. Normal range of motion.     Cervical back: Normal range of motion and neck supple.     Right lower leg: 3+ Pitting Edema present.     Left lower leg: 3+ Pitting Edema present.  Lymphadenopathy:     Cervical: No cervical adenopathy.  Skin:    General: Skin is warm.     Findings: No rash.  Neurological:     Mental Status: He is alert and oriented to person, place, and time.     Cranial Nerves: No cranial nerve deficit.     Deep Tendon Reflexes: Reflexes are normal and symmetric.     Wt Readings from Last 3 Encounters:  09/14/22 285 lb (129.3 kg)  06/10/22 225 lb (102.1 kg)  05/13/22 222 lb 9.6 oz (101 kg)    BP 110/60   Pulse 88   Ht 6' (1.829 m)   Wt 285 lb (129.3 kg)   SpO2 93% Comment: 88 when came in  BMI 38.65 kg/m   Assessment and Plan: 1. Weight gain with edema New onset.  Persistent over several months.  Stable.  But with increasing shortness of breath and 50 pound weight gain.  I think this is due to his decreased renal function rather than of a cardiac concern.  I have instructed him to resume his Lasix at 40 mg in the morning and 20 mg in the evening.  Patient has an app coming appointment with Dr. Di Kindle but will call her office to let them know of the significant amount of weight gain and whether or not preparation is needed may need to be made for dialysis on relatively short-term initiation.  2. CKD stage 4 due to type 2 diabetes mellitus (HCC) Chronic.  Followed by [facility] nephrology.  Patient will contact prior to his appointment later this week as to need for preparation for initiation of dialysis due to his stage IV end-stage renal disease.  3. Type 2 diabetes mellitus with diabetic nephropathy, without long-term current use of insulin (HCC) Chronic.   Controlled.  Stable.  Recently initiated Trulicity but has had normal fasting blood sugars.  Will check A1c for current status of control as well as microalbuminuria and comprehensive metabolic panel. - Dulaglutide (TRULICITY) A999333 0000000 SOPN; Inject 0.75 mg into the skin once a week.  Dispense: 2 mL; Refill: 1 - HgB A1c - Microalbumin / creatinine urine ratio - Comprehensive Metabolic Panel (CMET)  4. Hyperuricemia Chronic.  Controlled.  Stable.  Continue allopurinol 100 mg daily.- allopurinol (ZYLOPRIM) 100 MG tablet; Take 1 tablet (100 mg total) by mouth daily.  Dispense: 90 tablet; Refill: 1  5. Essential hypertension Chronic.  Controlled.  Blood pressure today is 110/60.  Continue amlodipine 10 mg once a day as well as metoprolol XL 100 mg once a day. - amLODipine (NORVASC) 10 MG tablet; Take 1 tablet (10 mg total) by mouth daily.  Dispense: 90 tablet; Refill: 1 - metoprolol succinate (TOPROL-XL) 100 MG 24 hr tablet; Take with or immediately following a meal.  Dispense: 90 tablet; Refill: 1 - Comprehensive Metabolic Panel (CMET)  6. Mixed hyperlipidemia Chronic.  Controlled.  Stable.  Continue lovastatin 20 mg once a day. - lovastatin (MEVACOR) 20 MG tablet; Take 1 tablet (20 mg total) by mouth daily.  Dispense: 90 tablet; Refill: 1 - Lipid Panel With LDL/HDL Ratio  7. Gastroesophageal reflux disease,  unspecified whether esophagitis present Chronic.  Controlled.  Stable.  Continue pantoprazole 40 mg once a day. - pantoprazole (PROTONIX) 40 MG tablet; Take 1 tablet (40 mg total) by mouth daily.  Dispense: 90 tablet; Refill: 1    Otilio Miu, MD

## 2022-09-15 LAB — COMPREHENSIVE METABOLIC PANEL
ALT: 14 IU/L (ref 0–44)
AST: 22 IU/L (ref 0–40)
Albumin/Globulin Ratio: 2.9 — ABNORMAL HIGH (ref 1.2–2.2)
Albumin: 4.3 g/dL (ref 3.9–4.9)
Alkaline Phosphatase: 60 IU/L (ref 44–121)
BUN/Creatinine Ratio: 11 (ref 10–24)
BUN: 101 mg/dL (ref 8–27)
Bilirubin Total: 0.8 mg/dL (ref 0.0–1.2)
CO2: 19 mmol/L — ABNORMAL LOW (ref 20–29)
Calcium: 10.4 mg/dL — ABNORMAL HIGH (ref 8.6–10.2)
Chloride: 104 mmol/L (ref 96–106)
Creatinine, Ser: 9.53 mg/dL — ABNORMAL HIGH (ref 0.76–1.27)
Globulin, Total: 1.5 g/dL (ref 1.5–4.5)
Glucose: 91 mg/dL (ref 70–99)
Potassium: 5 mmol/L (ref 3.5–5.2)
Sodium: 144 mmol/L (ref 134–144)
Total Protein: 5.8 g/dL — ABNORMAL LOW (ref 6.0–8.5)
eGFR: 5 mL/min/{1.73_m2} — ABNORMAL LOW (ref >59)

## 2022-09-15 LAB — MICROALBUMIN / CREATININE URINE RATIO
Creatinine, Urine: 57.7 mg/dL
Microalb/Creat Ratio: 307 mg/g creat — ABNORMAL HIGH (ref 0–29)
Microalbumin, Urine: 177.3 ug/mL

## 2022-09-15 LAB — LIPID PANEL WITH LDL/HDL RATIO
Cholesterol, Total: 83 mg/dL — ABNORMAL LOW (ref 100–199)
HDL: 32 mg/dL — ABNORMAL LOW (ref >39)
LDL Chol Calc (NIH): 37 mg/dL (ref 0–99)
LDL/HDL Ratio: 1.2 ratio (ref 0.0–3.6)
Triglycerides: 62 mg/dL (ref 0–149)
VLDL Cholesterol Cal: 14 mg/dL (ref 5–40)

## 2022-09-15 LAB — HEMOGLOBIN A1C
Est. average glucose Bld gHb Est-mCnc: 120 mg/dL
Hgb A1c MFr Bld: 5.8 % — ABNORMAL HIGH (ref 4.8–5.6)

## 2022-09-16 DIAGNOSIS — N184 Chronic kidney disease, stage 4 (severe): Secondary | ICD-10-CM | POA: Diagnosis not present

## 2022-09-16 DIAGNOSIS — M1A9XX0 Chronic gout, unspecified, without tophus (tophi): Secondary | ICD-10-CM | POA: Diagnosis not present

## 2022-09-16 DIAGNOSIS — Q613 Polycystic kidney, unspecified: Secondary | ICD-10-CM | POA: Diagnosis not present

## 2022-09-16 DIAGNOSIS — R5381 Other malaise: Secondary | ICD-10-CM | POA: Diagnosis not present

## 2022-09-16 DIAGNOSIS — N185 Chronic kidney disease, stage 5: Secondary | ICD-10-CM | POA: Diagnosis not present

## 2022-09-16 DIAGNOSIS — N2581 Secondary hyperparathyroidism of renal origin: Secondary | ICD-10-CM | POA: Diagnosis not present

## 2022-09-16 DIAGNOSIS — R252 Cramp and spasm: Secondary | ICD-10-CM | POA: Diagnosis not present

## 2022-09-16 DIAGNOSIS — I10 Essential (primary) hypertension: Secondary | ICD-10-CM | POA: Diagnosis not present

## 2022-09-16 DIAGNOSIS — D631 Anemia in chronic kidney disease: Secondary | ICD-10-CM | POA: Diagnosis not present

## 2022-09-29 ENCOUNTER — Other Ambulatory Visit (INDEPENDENT_AMBULATORY_CARE_PROVIDER_SITE_OTHER): Payer: Self-pay | Admitting: Vascular Surgery

## 2022-10-02 DIAGNOSIS — N2581 Secondary hyperparathyroidism of renal origin: Secondary | ICD-10-CM | POA: Diagnosis not present

## 2022-10-02 DIAGNOSIS — Z992 Dependence on renal dialysis: Secondary | ICD-10-CM | POA: Diagnosis not present

## 2022-10-02 DIAGNOSIS — N186 End stage renal disease: Secondary | ICD-10-CM | POA: Diagnosis not present

## 2022-10-02 DIAGNOSIS — D509 Iron deficiency anemia, unspecified: Secondary | ICD-10-CM | POA: Diagnosis not present

## 2022-10-05 ENCOUNTER — Other Ambulatory Visit: Payer: Self-pay | Admitting: Family Medicine

## 2022-10-05 ENCOUNTER — Telehealth: Payer: Self-pay | Admitting: Family Medicine

## 2022-10-05 DIAGNOSIS — N2581 Secondary hyperparathyroidism of renal origin: Secondary | ICD-10-CM | POA: Diagnosis not present

## 2022-10-05 DIAGNOSIS — D509 Iron deficiency anemia, unspecified: Secondary | ICD-10-CM | POA: Diagnosis not present

## 2022-10-05 DIAGNOSIS — E1121 Type 2 diabetes mellitus with diabetic nephropathy: Secondary | ICD-10-CM

## 2022-10-05 DIAGNOSIS — N186 End stage renal disease: Secondary | ICD-10-CM | POA: Diagnosis not present

## 2022-10-05 DIAGNOSIS — Z992 Dependence on renal dialysis: Secondary | ICD-10-CM | POA: Diagnosis not present

## 2022-10-05 NOTE — Telephone Encounter (Signed)
Eliezer Bottom, Pts niece is calling to report that she is coming into town to assist the patient today with dialysis. Gregary Signs would like to drop off [place] handicap [place] paperwork today for completion. Gregary Signs is aware of the 48-72 hours turn around time as she is a NP. However, she  is from out of town and would like to return the paperwork to the [facility] today while she is in town.  Gregary Signs CB- [phone]

## 2022-10-06 NOTE — Telephone Encounter (Signed)
Requested Prescriptions  Pending Prescriptions Disp Refills   TRULICITY [id number] SOPN [Pharmacy Med Name: TRULICITY [id number] ML PEN]  1    Sig: INJECT 0.75 MG SUBCUTANEOUSLY ONE TIME PER WEEK     Endocrinology:  Diabetes - GLP-1 Receptor Agonists Passed - 10/05/2022  1:46 AM      Passed - HBA1C is between 0 and 7.9 and within 180 days    Hgb A1c MFr Bld  Date Value Ref Range Status  09/14/2022 5.8 (H) 4.8 - 5.6 % Final    Comment:             Prediabetes: 5.7 - 6.4          Diabetes: >6.4          Glycemic control for adults with diabetes: <7.0          Passed - Valid encounter within last 6 months    Recent Outpatient Visits           3 weeks ago Weight gain with edema   [place] [facility] at [facility], MD   4 months ago Type 2 diabetes mellitus with diabetic nephropathy, without long-term current use of insulin ([place])   [place] [facility] at [place], Deanna C, MD   5 months ago Venous insufficiency   [place] [facility] at [facility], MD   9 months ago Type 2 diabetes mellitus with diabetic nephropathy, without long-term current use of insulin ([place])   [place] [facility] at [place], Deanna C, MD   1 year ago Type 2 diabetes mellitus with diabetic nephropathy, without long-term current use of insulin ([place])    [facility] at [place], [place], MD       Future Appointments             In 3 months Juline Patch, MD [place] at [facility], [facility]

## 2022-10-07 DIAGNOSIS — D509 Iron deficiency anemia, unspecified: Secondary | ICD-10-CM | POA: Diagnosis not present

## 2022-10-07 DIAGNOSIS — N186 End stage renal disease: Secondary | ICD-10-CM | POA: Diagnosis not present

## 2022-10-07 DIAGNOSIS — Z992 Dependence on renal dialysis: Secondary | ICD-10-CM | POA: Diagnosis not present

## 2022-10-07 DIAGNOSIS — N2581 Secondary hyperparathyroidism of renal origin: Secondary | ICD-10-CM | POA: Diagnosis not present

## 2022-10-09 DIAGNOSIS — D509 Iron deficiency anemia, unspecified: Secondary | ICD-10-CM | POA: Diagnosis not present

## 2022-10-09 DIAGNOSIS — N2581 Secondary hyperparathyroidism of renal origin: Secondary | ICD-10-CM | POA: Diagnosis not present

## 2022-10-09 DIAGNOSIS — Z992 Dependence on renal dialysis: Secondary | ICD-10-CM | POA: Diagnosis not present

## 2022-10-09 DIAGNOSIS — N186 End stage renal disease: Secondary | ICD-10-CM | POA: Diagnosis not present

## 2022-10-12 DIAGNOSIS — Z992 Dependence on renal dialysis: Secondary | ICD-10-CM | POA: Diagnosis not present

## 2022-10-12 DIAGNOSIS — N186 End stage renal disease: Secondary | ICD-10-CM | POA: Diagnosis not present

## 2022-10-12 DIAGNOSIS — N2581 Secondary hyperparathyroidism of renal origin: Secondary | ICD-10-CM | POA: Diagnosis not present

## 2022-10-12 DIAGNOSIS — D509 Iron deficiency anemia, unspecified: Secondary | ICD-10-CM | POA: Diagnosis not present

## 2022-10-14 DIAGNOSIS — D509 Iron deficiency anemia, unspecified: Secondary | ICD-10-CM | POA: Diagnosis not present

## 2022-10-14 DIAGNOSIS — Z992 Dependence on renal dialysis: Secondary | ICD-10-CM | POA: Diagnosis not present

## 2022-10-14 DIAGNOSIS — N2581 Secondary hyperparathyroidism of renal origin: Secondary | ICD-10-CM | POA: Diagnosis not present

## 2022-10-14 DIAGNOSIS — N186 End stage renal disease: Secondary | ICD-10-CM | POA: Diagnosis not present

## 2022-10-16 DIAGNOSIS — N186 End stage renal disease: Secondary | ICD-10-CM | POA: Diagnosis not present

## 2022-10-16 DIAGNOSIS — D509 Iron deficiency anemia, unspecified: Secondary | ICD-10-CM | POA: Diagnosis not present

## 2022-10-16 DIAGNOSIS — N2581 Secondary hyperparathyroidism of renal origin: Secondary | ICD-10-CM | POA: Diagnosis not present

## 2022-10-16 DIAGNOSIS — Z992 Dependence on renal dialysis: Secondary | ICD-10-CM | POA: Diagnosis not present

## 2022-10-18 DIAGNOSIS — Z992 Dependence on renal dialysis: Secondary | ICD-10-CM | POA: Diagnosis not present

## 2022-10-18 DIAGNOSIS — N186 End stage renal disease: Secondary | ICD-10-CM | POA: Diagnosis not present

## 2022-10-19 DIAGNOSIS — N186 End stage renal disease: Secondary | ICD-10-CM | POA: Diagnosis not present

## 2022-10-19 DIAGNOSIS — Z992 Dependence on renal dialysis: Secondary | ICD-10-CM | POA: Diagnosis not present

## 2022-10-19 DIAGNOSIS — Z23 Encounter for immunization: Secondary | ICD-10-CM | POA: Diagnosis not present

## 2022-10-19 DIAGNOSIS — N2581 Secondary hyperparathyroidism of renal origin: Secondary | ICD-10-CM | POA: Diagnosis not present

## 2022-10-19 DIAGNOSIS — D509 Iron deficiency anemia, unspecified: Secondary | ICD-10-CM | POA: Diagnosis not present

## 2022-10-19 DIAGNOSIS — D631 Anemia in chronic kidney disease: Secondary | ICD-10-CM | POA: Diagnosis not present

## 2022-10-21 DIAGNOSIS — Z23 Encounter for immunization: Secondary | ICD-10-CM | POA: Diagnosis not present

## 2022-10-21 DIAGNOSIS — N186 End stage renal disease: Secondary | ICD-10-CM | POA: Diagnosis not present

## 2022-10-21 DIAGNOSIS — Z992 Dependence on renal dialysis: Secondary | ICD-10-CM | POA: Diagnosis not present

## 2022-10-21 DIAGNOSIS — D509 Iron deficiency anemia, unspecified: Secondary | ICD-10-CM | POA: Diagnosis not present

## 2022-10-21 DIAGNOSIS — N2581 Secondary hyperparathyroidism of renal origin: Secondary | ICD-10-CM | POA: Diagnosis not present

## 2022-10-21 DIAGNOSIS — D631 Anemia in chronic kidney disease: Secondary | ICD-10-CM | POA: Diagnosis not present

## 2022-10-23 DIAGNOSIS — D509 Iron deficiency anemia, unspecified: Secondary | ICD-10-CM | POA: Diagnosis not present

## 2022-10-23 DIAGNOSIS — N186 End stage renal disease: Secondary | ICD-10-CM | POA: Diagnosis not present

## 2022-10-23 DIAGNOSIS — Z992 Dependence on renal dialysis: Secondary | ICD-10-CM | POA: Diagnosis not present

## 2022-10-23 DIAGNOSIS — Z23 Encounter for immunization: Secondary | ICD-10-CM | POA: Diagnosis not present

## 2022-10-23 DIAGNOSIS — N2581 Secondary hyperparathyroidism of renal origin: Secondary | ICD-10-CM | POA: Diagnosis not present

## 2022-10-23 DIAGNOSIS — D631 Anemia in chronic kidney disease: Secondary | ICD-10-CM | POA: Diagnosis not present

## 2022-10-26 DIAGNOSIS — N186 End stage renal disease: Secondary | ICD-10-CM | POA: Diagnosis not present

## 2022-10-26 DIAGNOSIS — Z23 Encounter for immunization: Secondary | ICD-10-CM | POA: Diagnosis not present

## 2022-10-26 DIAGNOSIS — D509 Iron deficiency anemia, unspecified: Secondary | ICD-10-CM | POA: Diagnosis not present

## 2022-10-26 DIAGNOSIS — Z992 Dependence on renal dialysis: Secondary | ICD-10-CM | POA: Diagnosis not present

## 2022-10-26 DIAGNOSIS — D631 Anemia in chronic kidney disease: Secondary | ICD-10-CM | POA: Diagnosis not present

## 2022-10-26 DIAGNOSIS — N2581 Secondary hyperparathyroidism of renal origin: Secondary | ICD-10-CM | POA: Diagnosis not present

## 2022-10-28 ENCOUNTER — Other Ambulatory Visit: Payer: Self-pay | Admitting: Family Medicine

## 2022-10-28 DIAGNOSIS — N2581 Secondary hyperparathyroidism of renal origin: Secondary | ICD-10-CM | POA: Diagnosis not present

## 2022-10-28 DIAGNOSIS — D631 Anemia in chronic kidney disease: Secondary | ICD-10-CM | POA: Diagnosis not present

## 2022-10-28 DIAGNOSIS — E1121 Type 2 diabetes mellitus with diabetic nephropathy: Secondary | ICD-10-CM

## 2022-10-28 DIAGNOSIS — Z992 Dependence on renal dialysis: Secondary | ICD-10-CM | POA: Diagnosis not present

## 2022-10-28 DIAGNOSIS — D509 Iron deficiency anemia, unspecified: Secondary | ICD-10-CM | POA: Diagnosis not present

## 2022-10-28 DIAGNOSIS — N186 End stage renal disease: Secondary | ICD-10-CM | POA: Diagnosis not present

## 2022-10-28 DIAGNOSIS — Z23 Encounter for immunization: Secondary | ICD-10-CM | POA: Diagnosis not present

## 2022-10-29 NOTE — Telephone Encounter (Signed)
Requested Prescriptions  Pending Prescriptions Disp Refills   TRULICITY 0.75 MG/0.5ML SOPN [Pharmacy Med Name: TRULICITY 0.75 MG/0.5 ML PEN] 6 mL 0    Sig: INJECT 0.75 MG SUBCUTANEOUSLY ONE TIME PER WEEK     Endocrinology:  Diabetes - GLP-1 Receptor Agonists Passed - 10/28/2022 10:47 PM      Passed - HBA1C is between 0 and 7.9 and within 180 days    Hgb A1c MFr Bld  Date Value Ref Range Status  09/14/2022 5.8 (H) 4.8 - 5.6 % Final    Comment:             Prediabetes: 5.7 - 6.4          Diabetes: >6.4          Glycemic control for adults with diabetes: <7.0          Passed - Valid encounter within last 6 months    Recent Outpatient Visits           1 month ago Weight gain with edema   [place] [facility] at [facility], MD   5 months ago Type 2 diabetes mellitus with diabetic nephropathy, without long-term current use of insulin (H[facility])   [place] [facility] at [facility], MD   6 months ago Venous insufficiency   [place] [facility] at [facility], MD   9 months ago Type 2 diabetes mellitus with diabetic nephropathy, without long-term current use of insulin (HCC)   [place] [facility] at [facility], MD   1 year ago Type 2 diabetes mellitus with diabetic nephropathy, without long-term current use of insulin (HCC)   [place] [facility] at [facility], MD       Future Appointments             In 2 months Duanne Limerick, MD [facility] at [facility], [facility]

## 2022-10-30 DIAGNOSIS — Z992 Dependence on renal dialysis: Secondary | ICD-10-CM | POA: Diagnosis not present

## 2022-10-30 DIAGNOSIS — N2581 Secondary hyperparathyroidism of renal origin: Secondary | ICD-10-CM | POA: Diagnosis not present

## 2022-10-30 DIAGNOSIS — N186 End stage renal disease: Secondary | ICD-10-CM | POA: Diagnosis not present

## 2022-10-30 DIAGNOSIS — D509 Iron deficiency anemia, unspecified: Secondary | ICD-10-CM | POA: Diagnosis not present

## 2022-10-30 DIAGNOSIS — D631 Anemia in chronic kidney disease: Secondary | ICD-10-CM | POA: Diagnosis not present

## 2022-10-30 DIAGNOSIS — Z23 Encounter for immunization: Secondary | ICD-10-CM | POA: Diagnosis not present

## 2022-11-02 DIAGNOSIS — Z992 Dependence on renal dialysis: Secondary | ICD-10-CM | POA: Diagnosis not present

## 2022-11-02 DIAGNOSIS — N2581 Secondary hyperparathyroidism of renal origin: Secondary | ICD-10-CM | POA: Diagnosis not present

## 2022-11-02 DIAGNOSIS — N186 End stage renal disease: Secondary | ICD-10-CM | POA: Diagnosis not present

## 2022-11-02 DIAGNOSIS — D631 Anemia in chronic kidney disease: Secondary | ICD-10-CM | POA: Diagnosis not present

## 2022-11-02 DIAGNOSIS — D509 Iron deficiency anemia, unspecified: Secondary | ICD-10-CM | POA: Diagnosis not present

## 2022-11-02 DIAGNOSIS — Z23 Encounter for immunization: Secondary | ICD-10-CM | POA: Diagnosis not present

## 2022-11-04 DIAGNOSIS — D509 Iron deficiency anemia, unspecified: Secondary | ICD-10-CM | POA: Diagnosis not present

## 2022-11-04 DIAGNOSIS — Z23 Encounter for immunization: Secondary | ICD-10-CM | POA: Diagnosis not present

## 2022-11-04 DIAGNOSIS — Z992 Dependence on renal dialysis: Secondary | ICD-10-CM | POA: Diagnosis not present

## 2022-11-04 DIAGNOSIS — N186 End stage renal disease: Secondary | ICD-10-CM | POA: Diagnosis not present

## 2022-11-04 DIAGNOSIS — D631 Anemia in chronic kidney disease: Secondary | ICD-10-CM | POA: Diagnosis not present

## 2022-11-04 DIAGNOSIS — N2581 Secondary hyperparathyroidism of renal origin: Secondary | ICD-10-CM | POA: Diagnosis not present

## 2022-11-06 DIAGNOSIS — D509 Iron deficiency anemia, unspecified: Secondary | ICD-10-CM | POA: Diagnosis not present

## 2022-11-06 DIAGNOSIS — N186 End stage renal disease: Secondary | ICD-10-CM | POA: Diagnosis not present

## 2022-11-06 DIAGNOSIS — N2581 Secondary hyperparathyroidism of renal origin: Secondary | ICD-10-CM | POA: Diagnosis not present

## 2022-11-06 DIAGNOSIS — D631 Anemia in chronic kidney disease: Secondary | ICD-10-CM | POA: Diagnosis not present

## 2022-11-06 DIAGNOSIS — Z23 Encounter for immunization: Secondary | ICD-10-CM | POA: Diagnosis not present

## 2022-11-06 DIAGNOSIS — Z992 Dependence on renal dialysis: Secondary | ICD-10-CM | POA: Diagnosis not present

## 2022-11-09 DIAGNOSIS — N2581 Secondary hyperparathyroidism of renal origin: Secondary | ICD-10-CM | POA: Diagnosis not present

## 2022-11-09 DIAGNOSIS — Z23 Encounter for immunization: Secondary | ICD-10-CM | POA: Diagnosis not present

## 2022-11-09 DIAGNOSIS — D509 Iron deficiency anemia, unspecified: Secondary | ICD-10-CM | POA: Diagnosis not present

## 2022-11-09 DIAGNOSIS — Z992 Dependence on renal dialysis: Secondary | ICD-10-CM | POA: Diagnosis not present

## 2022-11-09 DIAGNOSIS — N186 End stage renal disease: Secondary | ICD-10-CM | POA: Diagnosis not present

## 2022-11-09 DIAGNOSIS — D631 Anemia in chronic kidney disease: Secondary | ICD-10-CM | POA: Diagnosis not present

## 2022-11-11 DIAGNOSIS — Z992 Dependence on renal dialysis: Secondary | ICD-10-CM | POA: Diagnosis not present

## 2022-11-11 DIAGNOSIS — N2581 Secondary hyperparathyroidism of renal origin: Secondary | ICD-10-CM | POA: Diagnosis not present

## 2022-11-11 DIAGNOSIS — D631 Anemia in chronic kidney disease: Secondary | ICD-10-CM | POA: Diagnosis not present

## 2022-11-11 DIAGNOSIS — N186 End stage renal disease: Secondary | ICD-10-CM | POA: Diagnosis not present

## 2022-11-11 DIAGNOSIS — D509 Iron deficiency anemia, unspecified: Secondary | ICD-10-CM | POA: Diagnosis not present

## 2022-11-11 DIAGNOSIS — Z23 Encounter for immunization: Secondary | ICD-10-CM | POA: Diagnosis not present

## 2022-11-13 DIAGNOSIS — D631 Anemia in chronic kidney disease: Secondary | ICD-10-CM | POA: Diagnosis not present

## 2022-11-13 DIAGNOSIS — D509 Iron deficiency anemia, unspecified: Secondary | ICD-10-CM | POA: Diagnosis not present

## 2022-11-13 DIAGNOSIS — N186 End stage renal disease: Secondary | ICD-10-CM | POA: Diagnosis not present

## 2022-11-13 DIAGNOSIS — Z992 Dependence on renal dialysis: Secondary | ICD-10-CM | POA: Diagnosis not present

## 2022-11-13 DIAGNOSIS — N2581 Secondary hyperparathyroidism of renal origin: Secondary | ICD-10-CM | POA: Diagnosis not present

## 2022-11-13 DIAGNOSIS — Z23 Encounter for immunization: Secondary | ICD-10-CM | POA: Diagnosis not present

## 2022-11-16 DIAGNOSIS — D631 Anemia in chronic kidney disease: Secondary | ICD-10-CM | POA: Diagnosis not present

## 2022-11-16 DIAGNOSIS — Z23 Encounter for immunization: Secondary | ICD-10-CM | POA: Diagnosis not present

## 2022-11-16 DIAGNOSIS — D509 Iron deficiency anemia, unspecified: Secondary | ICD-10-CM | POA: Diagnosis not present

## 2022-11-16 DIAGNOSIS — N2581 Secondary hyperparathyroidism of renal origin: Secondary | ICD-10-CM | POA: Diagnosis not present

## 2022-11-16 DIAGNOSIS — N186 End stage renal disease: Secondary | ICD-10-CM | POA: Diagnosis not present

## 2022-11-16 DIAGNOSIS — Z992 Dependence on renal dialysis: Secondary | ICD-10-CM | POA: Diagnosis not present

## 2022-11-17 DIAGNOSIS — Q612 Polycystic kidney, adult type: Secondary | ICD-10-CM | POA: Diagnosis not present

## 2022-11-17 DIAGNOSIS — Z992 Dependence on renal dialysis: Secondary | ICD-10-CM | POA: Diagnosis not present

## 2022-11-17 DIAGNOSIS — N186 End stage renal disease: Secondary | ICD-10-CM | POA: Diagnosis not present

## 2022-11-18 DIAGNOSIS — N186 End stage renal disease: Secondary | ICD-10-CM | POA: Diagnosis not present

## 2022-11-18 DIAGNOSIS — N2581 Secondary hyperparathyroidism of renal origin: Secondary | ICD-10-CM | POA: Diagnosis not present

## 2022-11-18 DIAGNOSIS — D509 Iron deficiency anemia, unspecified: Secondary | ICD-10-CM | POA: Diagnosis not present

## 2022-11-18 DIAGNOSIS — D631 Anemia in chronic kidney disease: Secondary | ICD-10-CM | POA: Diagnosis not present

## 2022-11-18 DIAGNOSIS — Z23 Encounter for immunization: Secondary | ICD-10-CM | POA: Diagnosis not present

## 2022-11-18 DIAGNOSIS — Z992 Dependence on renal dialysis: Secondary | ICD-10-CM | POA: Diagnosis not present

## 2022-11-20 DIAGNOSIS — Z992 Dependence on renal dialysis: Secondary | ICD-10-CM | POA: Diagnosis not present

## 2022-11-20 DIAGNOSIS — D631 Anemia in chronic kidney disease: Secondary | ICD-10-CM | POA: Diagnosis not present

## 2022-11-20 DIAGNOSIS — D509 Iron deficiency anemia, unspecified: Secondary | ICD-10-CM | POA: Diagnosis not present

## 2022-11-20 DIAGNOSIS — N186 End stage renal disease: Secondary | ICD-10-CM | POA: Diagnosis not present

## 2022-11-20 DIAGNOSIS — N2581 Secondary hyperparathyroidism of renal origin: Secondary | ICD-10-CM | POA: Diagnosis not present

## 2022-11-23 DIAGNOSIS — D631 Anemia in chronic kidney disease: Secondary | ICD-10-CM | POA: Diagnosis not present

## 2022-11-23 DIAGNOSIS — N186 End stage renal disease: Secondary | ICD-10-CM | POA: Diagnosis not present

## 2022-11-23 DIAGNOSIS — Z992 Dependence on renal dialysis: Secondary | ICD-10-CM | POA: Diagnosis not present

## 2022-11-23 DIAGNOSIS — D509 Iron deficiency anemia, unspecified: Secondary | ICD-10-CM | POA: Diagnosis not present

## 2022-11-23 DIAGNOSIS — N2581 Secondary hyperparathyroidism of renal origin: Secondary | ICD-10-CM | POA: Diagnosis not present

## 2022-11-25 ENCOUNTER — Other Ambulatory Visit (INDEPENDENT_AMBULATORY_CARE_PROVIDER_SITE_OTHER): Payer: Self-pay | Admitting: Nurse Practitioner

## 2022-11-25 DIAGNOSIS — N2581 Secondary hyperparathyroidism of renal origin: Secondary | ICD-10-CM | POA: Diagnosis not present

## 2022-11-25 DIAGNOSIS — D509 Iron deficiency anemia, unspecified: Secondary | ICD-10-CM | POA: Diagnosis not present

## 2022-11-25 DIAGNOSIS — N186 End stage renal disease: Secondary | ICD-10-CM | POA: Diagnosis not present

## 2022-11-25 DIAGNOSIS — Z992 Dependence on renal dialysis: Secondary | ICD-10-CM | POA: Diagnosis not present

## 2022-11-25 DIAGNOSIS — D631 Anemia in chronic kidney disease: Secondary | ICD-10-CM | POA: Diagnosis not present

## 2022-11-26 ENCOUNTER — Ambulatory Visit (INDEPENDENT_AMBULATORY_CARE_PROVIDER_SITE_OTHER): Payer: Medicare Other

## 2022-11-26 ENCOUNTER — Ambulatory Visit (INDEPENDENT_AMBULATORY_CARE_PROVIDER_SITE_OTHER): Payer: Medicare Other | Admitting: Nurse Practitioner

## 2022-11-26 ENCOUNTER — Encounter (INDEPENDENT_AMBULATORY_CARE_PROVIDER_SITE_OTHER): Payer: Self-pay | Admitting: Nurse Practitioner

## 2022-11-26 VITALS — BP 123/60 | HR 96 | Resp 18 | Ht 72.0 in | Wt 188.6 lb

## 2022-11-26 DIAGNOSIS — I10 Essential (primary) hypertension: Secondary | ICD-10-CM | POA: Diagnosis not present

## 2022-11-26 DIAGNOSIS — N186 End stage renal disease: Secondary | ICD-10-CM | POA: Diagnosis not present

## 2022-11-26 DIAGNOSIS — I890 Lymphedema, not elsewhere classified: Secondary | ICD-10-CM | POA: Diagnosis not present

## 2022-11-27 ENCOUNTER — Encounter (INDEPENDENT_AMBULATORY_CARE_PROVIDER_SITE_OTHER): Payer: Self-pay | Admitting: Nurse Practitioner

## 2022-11-27 DIAGNOSIS — Z992 Dependence on renal dialysis: Secondary | ICD-10-CM | POA: Diagnosis not present

## 2022-11-27 DIAGNOSIS — D631 Anemia in chronic kidney disease: Secondary | ICD-10-CM | POA: Diagnosis not present

## 2022-11-27 DIAGNOSIS — D509 Iron deficiency anemia, unspecified: Secondary | ICD-10-CM | POA: Diagnosis not present

## 2022-11-27 DIAGNOSIS — N186 End stage renal disease: Secondary | ICD-10-CM | POA: Diagnosis not present

## 2022-11-27 DIAGNOSIS — N2581 Secondary hyperparathyroidism of renal origin: Secondary | ICD-10-CM | POA: Diagnosis not present

## 2022-11-27 NOTE — Progress Notes (Signed)
Subjective:    Patient ID: Mark Bauer, male    DOB: 1955-12-05, 67 y.o.   MRN: [id number] Chief Complaint  Patient presents with   New Patient (Initial Visit)    NP HDA/consult. excessive bleeding post tx.    The patient returns to the office for follow up regarding a problem with their dialysis access.   The patient notes a significant increase in bleeding time after decannulation.    The patient denies hand pain or other symptoms consistent with steal phenomena.  No significant arm swelling.  The patient denies redness or swelling at the access site. The patient denies fever or chills at home or while on dialysis.  The patient notes swelling in his lower extremities is much improved compared to prior to the start of dialysis.  No recent shortening of the patient's walking distance or new symptoms consistent with claudication.  No history of rest pain symptoms. No new ulcers or wounds of the lower extremities have occurred.  The patient denies amaurosis fugax or recent TIA symptoms. There are no recent neurological changes noted. There is no history of DVT, PE or superficial thrombophlebitis. No recent episodes of angina or shortness of breath documented.   Duplex ultrasound of the AV access shows a patent access.  The previously noted stenosis is significantly increased compared to last study.  Flow volume today is 1401 cc/min (previous flow volume was 2867 cc/min)    Review of Systems  All other systems reviewed and are negative.      Objective:   Physical Exam Vitals reviewed.  HENT:     Head: Normocephalic.  Cardiovascular:     Rate and Rhythm: Normal rate.     Pulses:          Radial pulses are 2+ on the left side.     Arteriovenous access: Left arteriovenous access is present.    Comments: Good thrill and bruit, however pulsatile Pulmonary:     Effort: Pulmonary effort is normal.  Skin:    General: Skin is warm and dry.  Neurological:     Mental Status:  He is alert and oriented to person, place, and time.  Psychiatric:        Mood and Affect: Mood normal.        Behavior: Behavior normal.        Thought Content: Thought content normal.        Judgment: Judgment normal.     BP 123/60 (BP Location: Right Arm)   Pulse 96   Resp 18   Ht 6' (1.829 m)   Wt 188 lb 9.6 oz (85.5 kg)   BMI 25.58 kg/m   Past Medical History:  Diagnosis Date   Allergy    Bilateral undescended testicles    Cancer (HCC)    melanoma on forehead   Cataract    OU   Diabetes mellitus without complication (HCC)    Dysrhythmia    a fib   GERD (gastroesophageal reflux disease)    Gout    Hepatitis    polycystic    Hypertension    Hypokalemia    Mixed hyperlipidemia    Polycystic kidney    Vitamin D deficiency     Social History   Socioeconomic History   Marital status: Single    Spouse name: Not on file   Number of children: 0   Years of education: Not on file   Highest education level: Associate degree: occupational, Scientist, product/process development, or vocational program  Occupational History   Occupation: retired  Tobacco Use   Smoking status: Never   Smokeless tobacco: Never  Vaping Use   Vaping Use: Never used  Substance and Sexual Activity   Alcohol use: No   Drug use: No   Sexual activity: Never  Other Topics Concern   Not on file  Social History Narrative   Never married, no children, lives alone   Social Determinants of Health   Financial Resource Strain: Low Risk  (01/14/2022)   Overall Financial Resource Strain (CARDIA)    Difficulty of Paying Living Expenses: Not hard at all  Food Insecurity: No Food Insecurity (01/14/2022)   Hunger Vital Sign    Worried About Running Out of Food in the Last Year: Never true    Ran Out of Food in the Last Year: Never true  Transportation Needs: No Transportation Needs (01/14/2022)   PRAPARE - Administrator, Civil Service (Medical): No    Lack of Transportation (Non-Medical): No  Physical  Activity: Sufficiently Active (01/14/2022)   Exercise Vital Sign    Days of Exercise per Week: 5 days    Minutes of Exercise per Session: 30 min  Stress: No Stress Concern Present (01/14/2022)   [employer] of Occupational Health - Occupational Stress Questionnaire    Feeling of Stress : Not at all  Social Connections: Socially Isolated (01/14/2022)   Social Connection and Isolation Panel [NHANES]    Frequency of Communication with Friends and Family: More than three times a week    Frequency of Social Gatherings with Friends and Family: Once a week    Attends Religious Services: Never    Database administrator or Organizations: No    Attends Banker Meetings: Never    Marital Status: Never married  Intimate Partner Violence: Not At Risk (01/14/2022)   Humiliation, Afraid, Rape, and Kick questionnaire    Fear of Current or Ex-Partner: No    Emotionally Abused: No    Physically Abused: No    Sexually Abused: No    Past Surgical History:  Procedure Laterality Date   BASCILIC VEIN TRANSPOSITION Left 02/23/2020   Procedure: BASCILIC VEIN TRANSPOSITION (SINGLE STAGE);  Surgeon: Renford Dills, MD;  Location: [facility] ORS;  Service: Vascular;  Laterality: Left;   COLONOSCOPY  2012   hemorrhoids   melanoma removal     forehead    Family History  Problem Relation Age of Onset   Diabetes Mother    Heart disease Father     Allergies  Allergen Reactions   Ciprofloxacin Other (See Comments)    hallucination   Pollen Extract Other (See Comments)    Sneezing, watery eyes, etc.   Caffeine Palpitations    palpatations       Latest Ref Rng & Units 09/12/2021   10:36 AM 02/16/2020    2:34 PM 01/09/2019   12:06 PM  CBC  WBC 3.4 - 10.8 x10E3/uL 5.9  6.2  7.1   Hemoglobin 13.0 - 17.7 g/dL 78.4  69.6  29.5   Hematocrit 37.5 - 51.0 % 30.8  37.1  38.2   Platelets 150 - 450 x10E3/uL 119  157  172       CMP     Component Value Date/Time   NA 144 09/14/2022 1120   K  5.0 09/14/2022 1120   CL 104 09/14/2022 1120   CO2 19 (L) 09/14/2022 1120   GLUCOSE 91 09/14/2022 1120   GLUCOSE 109 (H) 02/16/2020 1434  BUN 101 (HH) 09/14/2022 1120   CREATININE 9.53 (H) 09/14/2022 1120   CALCIUM 10.4 (H) 09/14/2022 1120   PROT 5.8 (L) 09/14/2022 1120   ALBUMIN 4.3 09/14/2022 1120   AST 22 09/14/2022 1120   ALT 14 09/14/2022 1120   ALKPHOS 60 09/14/2022 1120   BILITOT 0.8 09/14/2022 1120   GFRNONAA 8 (L) 02/16/2020 1434   GFRAA 9 (L) 02/16/2020 1434     No results found.     Assessment & Plan:   1. End stage renal disease (HCC) Recommend:  The patient is experiencing increasing problems with their dialysis access.  Patient should have a fistulagram with the intention for intervention.  The intention for intervention is to restore appropriate flow and prevent thrombosis and possible loss of the access.  As well as improve the quality of dialysis therapy.  The risks, benefits and alternative therapies were reviewed in detail with the patient.  All questions were answered.  The patient agrees to proceed with angio/intervention.    The patient will follow up with me in the office after the procedure.   2. Lymphedema Much improved following dialysis initiation currently well-controlled  3. Essential hypertension Continue antihypertensive medications as already ordered, these medications have been reviewed and there are no changes at this time.   Current Outpatient Medications on File Prior to Visit  Medication Sig Dispense Refill   allopurinol (ZYLOPRIM) 100 MG tablet Take 1 tablet (100 mg total) by mouth daily. 90 tablet 1   blood glucose meter kit and supplies Dispense based on patient and insurance preference. Use up to four times daily as directed. (FOR ICD-10 E11.9). 1 each 0   Cholecalciferol 50 MCG (2000 UT) CAPS Take 2,000 Units by mouth daily.      cyanocobalamin 1000 MCG tablet Take 1,000 mcg by mouth daily.     Dulaglutide (TRULICITY) 0.75  MG/0.5ML SOPN INJECT 0.75 MG SUBCUTANEOUSLY ONE TIME PER WEEK 6 mL 0   ELIQUIS 2.5 MG TABS tablet TAKE 1 TABLET BY MOUTH TWICE A DAY 60 tablet 11   furosemide (LASIX) 20 MG tablet Take 1 tablet (20 mg total) by mouth daily. 30 tablet 0   Lancets (ONETOUCH DELICA PLUS LANCET30G) MISC USE AS DIRECTED EVERY DAY     loratadine (CLARITIN) 10 MG tablet Take 10 mg by mouth at bedtime. otc     lovastatin (MEVACOR) 20 MG tablet Take 1 tablet (20 mg total) by mouth daily. 90 tablet 1   metoprolol succinate (TOPROL-XL) 100 MG 24 hr tablet Take with or immediately following a meal. 90 tablet 1   ONETOUCH ULTRA test strip ONE TEST DAILY 100 strip 0   pantoprazole (PROTONIX) 40 MG tablet Take 1 tablet (40 mg total) by mouth daily. 90 tablet 1   spironolactone (ALDACTONE) 25 MG tablet Take 25 mg by mouth daily.     amLODipine (NORVASC) 10 MG tablet Take 1 tablet (10 mg total) by mouth daily. 90 tablet 1   FERROUS GLUCONATE IRON PO Take by mouth.     Insulin Pen Needle (NOVOFINE PEN NEEDLE) 32G X 6 MM MISC USE AND DISCARD 1 PEN      NEEDLE SUBCUTANEOUSLY      DAILY AS DIRECTED 100 each 0   patiromer (VELTASSA) 8.4 g packet Take by mouth. Lester Eden (Patient not taking: Reported on 09/14/2022)     No current facility-administered medications on file prior to visit.    There are no Patient Instructions on file for this visit. No follow-ups  on file.   Kris Hartmann, NP

## 2022-11-27 NOTE — H&P (View-Only) (Signed)
 Subjective:    Patient ID: Mark Bauer, male    DOB: 01/13/1956, 66 y.o.   MRN: [id number] Chief Complaint  Patient presents with   New Patient (Initial Visit)    NP HDA/consult. excessive bleeding post tx.    The patient returns to the office for follow up regarding a problem with their dialysis access.   The patient notes a significant increase in bleeding time after decannulation.    The patient denies hand pain or other symptoms consistent with steal phenomena.  No significant arm swelling.  The patient denies redness or swelling at the access site. The patient denies fever or chills at home or while on dialysis.  The patient notes swelling in his lower extremities is much improved compared to prior to the start of dialysis.  No recent shortening of the patient's walking distance or new symptoms consistent with claudication.  No history of rest pain symptoms. No new ulcers or wounds of the lower extremities have occurred.  The patient denies amaurosis fugax or recent TIA symptoms. There are no recent neurological changes noted. There is no history of DVT, PE or superficial thrombophlebitis. No recent episodes of angina or shortness of breath documented.   Duplex ultrasound of the AV access shows a patent access.  The previously noted stenosis is significantly increased compared to last study.  Flow volume today is 1401 cc/min (previous flow volume was 2867 cc/min)    Review of Systems  All other systems reviewed and are negative.      Objective:   Physical Exam Vitals reviewed.  HENT:     Head: Normocephalic.  Cardiovascular:     Rate and Rhythm: Normal rate.     Pulses:          Radial pulses are 2+ on the left side.     Arteriovenous access: Left arteriovenous access is present.    Comments: Good thrill and bruit, however pulsatile Pulmonary:     Effort: Pulmonary effort is normal.  Skin:    General: Skin is warm and dry.  Neurological:     Mental Status:  He is alert and oriented to person, place, and time.  Psychiatric:        Mood and Affect: Mood normal.        Behavior: Behavior normal.        Thought Content: Thought content normal.        Judgment: Judgment normal.     BP 123/60 (BP Location: Right Arm)   Pulse 96   Resp 18   Ht 6' (1.829 m)   Wt 188 lb 9.6 oz (85.5 kg)   BMI 25.58 kg/m   Past Medical History:  Diagnosis Date   Allergy    Bilateral undescended testicles    Cancer (HCC)    melanoma on forehead   Cataract    OU   Diabetes mellitus without complication (HCC)    Dysrhythmia    a fib   GERD (gastroesophageal reflux disease)    Gout    Hepatitis    polycystic    Hypertension    Hypokalemia    Mixed hyperlipidemia    Polycystic kidney    Vitamin D deficiency     Social History   Socioeconomic History   Marital status: Single    Spouse name: Not on file   Number of children: 0   Years of education: Not on file   Highest education level: Associate degree: occupational, technical, or vocational program    Occupational History   Occupation: retired  Tobacco Use   Smoking status: Never   Smokeless tobacco: Never  Vaping Use   Vaping Use: Never used  Substance and Sexual Activity   Alcohol use: No   Drug use: No   Sexual activity: Never  Other Topics Concern   Not on file  Social History Narrative   Never married, no children, lives alone   Social Determinants of Health   Financial Resource Strain: Low Risk  (01/14/2022)   Overall Financial Resource Strain (CARDIA)    Difficulty of Paying Living Expenses: Not hard at all  Food Insecurity: No Food Insecurity (01/14/2022)   Hunger Vital Sign    Worried About Running Out of Food in the Last Year: Never true    Ran Out of Food in the Last Year: Never true  Transportation Needs: No Transportation Needs (01/14/2022)   PRAPARE - Transportation    Lack of Transportation (Medical): No    Lack of Transportation (Non-Medical): No  Physical  Activity: Sufficiently Active (01/14/2022)   Exercise Vital Sign    Days of Exercise per Week: 5 days    Minutes of Exercise per Session: 30 min  Stress: No Stress Concern Present (01/14/2022)   [employer]ational [employer] - Occupational Stress Questionnaire    Feeling of Stress : Not at all  Social Connections: Socially Isolated (01/14/2022)   Social Connection and Isolation Panel [NHANES]    Frequency of Communication with Friends and Family: More than three times a week    Frequency of Social Gatherings with Friends and Family: Once a week    Attends Religious Services: Never    Active Member of Clubs or Organizations: No    Attends Club or Organization Meetings: Never    Marital Status: Never married  Intimate Partner Violence: Not At Risk (01/14/2022)   Humiliation, Afraid, Rape, and Kick questionnaire    Fear of Current or Ex-Partner: No    Emotionally Abused: No    Physically Abused: No    Sexually Abused: No    Past Surgical History:  Procedure Laterality Date   BASCILIC VEIN TRANSPOSITION Left 02/23/2020   Procedure: BASCILIC VEIN TRANSPOSITION (SINGLE STAGE);  Surgeon: Schnier, Gregory G, MD;  Location: [facility] ORS;  Service: Vascular;  Laterality: Left;   COLONOSCOPY  2012   hemorrhoids   melanoma removal     forehead    Family History  Problem Relation Age of Onset   Diabetes Mother    Heart disease Father     Allergies  Allergen Reactions   Ciprofloxacin Other (See Comments)    hallucination   Pollen Extract Other (See Comments)    Sneezing, watery eyes, etc.   Caffeine Palpitations    palpatations       Latest Ref Rng & Units 09/12/2021   10:36 AM 02/16/2020    2:34 PM 01/09/2019   12:06 PM  CBC  WBC 3.4 - 10.8 x10E3/uL 5.9  6.2  7.1   Hemoglobin 13.0 - 17.7 g/dL 10.0  12.6  13.1   Hematocrit 37.5 - 51.0 % 30.8  37.1  38.2   Platelets 150 - 450 x10E3/uL 119  157  172       CMP     Component Value Date/Time   NA 144 09/14/2022 1120   K  5.0 09/14/2022 1120   CL 104 09/14/2022 1120   CO2 19 (L) 09/14/2022 1120   GLUCOSE 91 09/14/2022 1120   GLUCOSE 109 (H) 02/16/2020 1434     BUN 101 (HH) 09/14/2022 1120   CREATININE 9.53 (H) 09/14/2022 1120   CALCIUM 10.4 (H) 09/14/2022 1120   PROT 5.8 (L) 09/14/2022 1120   ALBUMIN 4.3 09/14/2022 1120   AST 22 09/14/2022 1120   ALT 14 09/14/2022 1120   ALKPHOS 60 09/14/2022 1120   BILITOT 0.8 09/14/2022 1120   GFRNONAA 8 (L) 02/16/2020 1434   GFRAA 9 (L) 02/16/2020 1434     No results found.     Assessment & Plan:   1. End stage renal disease (HCC) Recommend:  The patient is experiencing increasing problems with their dialysis access.  Patient should have a fistulagram with the intention for intervention.  The intention for intervention is to restore appropriate flow and prevent thrombosis and possible loss of the access.  As well as improve the quality of dialysis therapy.  The risks, benefits and alternative therapies were reviewed in detail with the patient.  All questions were answered.  The patient agrees to proceed with angio/intervention.    The patient will follow up with me in the office after the procedure.   2. Lymphedema Much improved following dialysis initiation currently well-controlled  3. Essential hypertension Continue antihypertensive medications as already ordered, these medications have been reviewed and there are no changes at this time.   Current Outpatient Medications on File Prior to Visit  Medication Sig Dispense Refill   allopurinol (ZYLOPRIM) 100 MG tablet Take 1 tablet (100 mg total) by mouth daily. 90 tablet 1   blood glucose meter kit and supplies Dispense based on patient and insurance preference. Use up to four times daily as directed. (FOR ICD-10 E11.9). 1 each 0   Cholecalciferol 50 MCG (2000 UT) CAPS Take 2,000 Units by mouth daily.      cyanocobalamin 1000 MCG tablet Take 1,000 mcg by mouth daily.     Dulaglutide (TRULICITY) 0.75  MG/0.5ML SOPN INJECT 0.75 MG SUBCUTANEOUSLY ONE TIME PER WEEK 6 mL 0   ELIQUIS 2.5 MG TABS tablet TAKE 1 TABLET BY MOUTH TWICE A DAY 60 tablet 11   furosemide (LASIX) 20 MG tablet Take 1 tablet (20 mg total) by mouth daily. 30 tablet 0   Lancets (ONETOUCH DELICA PLUS LANCET30G) MISC USE AS DIRECTED EVERY DAY     loratadine (CLARITIN) 10 MG tablet Take 10 mg by mouth at bedtime. otc     lovastatin (MEVACOR) 20 MG tablet Take 1 tablet (20 mg total) by mouth daily. 90 tablet 1   metoprolol succinate (TOPROL-XL) 100 MG 24 hr tablet Take with or immediately following a meal. 90 tablet 1   ONETOUCH ULTRA test strip ONE TEST DAILY 100 strip 0   pantoprazole (PROTONIX) 40 MG tablet Take 1 tablet (40 mg total) by mouth daily. 90 tablet 1   spironolactone (ALDACTONE) 25 MG tablet Take 25 mg by mouth daily.     amLODipine (NORVASC) 10 MG tablet Take 1 tablet (10 mg total) by mouth daily. 90 tablet 1   FERROUS GLUCONATE IRON PO Take by mouth.     Insulin Pen Needle (NOVOFINE PEN NEEDLE) 32G X 6 MM MISC USE AND DISCARD 1 PEN      NEEDLE SUBCUTANEOUSLY      DAILY AS DIRECTED 100 each 0   patiromer (VELTASSA) 8.4 g packet Take by mouth. Lucy Menefee (Patient not taking: Reported on 09/14/2022)     No current facility-administered medications on file prior to visit.    There are no Patient Instructions on file for this visit. No follow-ups   on file.   Daniele Dillow E Jaedan Huttner, NP   

## 2022-11-30 DIAGNOSIS — N2581 Secondary hyperparathyroidism of renal origin: Secondary | ICD-10-CM | POA: Diagnosis not present

## 2022-11-30 DIAGNOSIS — D631 Anemia in chronic kidney disease: Secondary | ICD-10-CM | POA: Diagnosis not present

## 2022-11-30 DIAGNOSIS — D509 Iron deficiency anemia, unspecified: Secondary | ICD-10-CM | POA: Diagnosis not present

## 2022-11-30 DIAGNOSIS — Z992 Dependence on renal dialysis: Secondary | ICD-10-CM | POA: Diagnosis not present

## 2022-11-30 DIAGNOSIS — N186 End stage renal disease: Secondary | ICD-10-CM | POA: Diagnosis not present

## 2022-12-01 LAB — HM DIABETES EYE EXAM

## 2022-12-02 DIAGNOSIS — D509 Iron deficiency anemia, unspecified: Secondary | ICD-10-CM | POA: Diagnosis not present

## 2022-12-02 DIAGNOSIS — N186 End stage renal disease: Secondary | ICD-10-CM | POA: Diagnosis not present

## 2022-12-02 DIAGNOSIS — D631 Anemia in chronic kidney disease: Secondary | ICD-10-CM | POA: Diagnosis not present

## 2022-12-02 DIAGNOSIS — Z992 Dependence on renal dialysis: Secondary | ICD-10-CM | POA: Diagnosis not present

## 2022-12-02 DIAGNOSIS — N2581 Secondary hyperparathyroidism of renal origin: Secondary | ICD-10-CM | POA: Diagnosis not present

## 2022-12-04 DIAGNOSIS — N186 End stage renal disease: Secondary | ICD-10-CM | POA: Diagnosis not present

## 2022-12-04 DIAGNOSIS — D631 Anemia in chronic kidney disease: Secondary | ICD-10-CM | POA: Diagnosis not present

## 2022-12-04 DIAGNOSIS — Z992 Dependence on renal dialysis: Secondary | ICD-10-CM | POA: Diagnosis not present

## 2022-12-04 DIAGNOSIS — N2581 Secondary hyperparathyroidism of renal origin: Secondary | ICD-10-CM | POA: Diagnosis not present

## 2022-12-04 DIAGNOSIS — D509 Iron deficiency anemia, unspecified: Secondary | ICD-10-CM | POA: Diagnosis not present

## 2022-12-07 DIAGNOSIS — Z992 Dependence on renal dialysis: Secondary | ICD-10-CM | POA: Diagnosis not present

## 2022-12-07 DIAGNOSIS — N186 End stage renal disease: Secondary | ICD-10-CM | POA: Diagnosis not present

## 2022-12-07 DIAGNOSIS — N2581 Secondary hyperparathyroidism of renal origin: Secondary | ICD-10-CM | POA: Diagnosis not present

## 2022-12-07 DIAGNOSIS — D509 Iron deficiency anemia, unspecified: Secondary | ICD-10-CM | POA: Diagnosis not present

## 2022-12-07 DIAGNOSIS — D631 Anemia in chronic kidney disease: Secondary | ICD-10-CM | POA: Diagnosis not present

## 2022-12-08 ENCOUNTER — Telehealth (INDEPENDENT_AMBULATORY_CARE_PROVIDER_SITE_OTHER): Payer: Self-pay

## 2022-12-08 NOTE — Telephone Encounter (Signed)
Spoke with the patient and he is scheduled with Dr. Gilda Crease for a left arm fistulagram on 12/15/22 with a 12:00 pm arrival time to the [facility]. Pre-procedure instructions were discussed and will be sent to Mychart.

## 2022-12-09 ENCOUNTER — Ambulatory Visit (INDEPENDENT_AMBULATORY_CARE_PROVIDER_SITE_OTHER): Payer: Medicare Other | Admitting: Nurse Practitioner

## 2022-12-09 ENCOUNTER — Encounter (INDEPENDENT_AMBULATORY_CARE_PROVIDER_SITE_OTHER): Payer: Medicare Other

## 2022-12-09 DIAGNOSIS — D509 Iron deficiency anemia, unspecified: Secondary | ICD-10-CM | POA: Diagnosis not present

## 2022-12-09 DIAGNOSIS — N186 End stage renal disease: Secondary | ICD-10-CM | POA: Diagnosis not present

## 2022-12-09 DIAGNOSIS — N2581 Secondary hyperparathyroidism of renal origin: Secondary | ICD-10-CM | POA: Diagnosis not present

## 2022-12-09 DIAGNOSIS — D631 Anemia in chronic kidney disease: Secondary | ICD-10-CM | POA: Diagnosis not present

## 2022-12-09 DIAGNOSIS — Z992 Dependence on renal dialysis: Secondary | ICD-10-CM | POA: Diagnosis not present

## 2022-12-11 DIAGNOSIS — D509 Iron deficiency anemia, unspecified: Secondary | ICD-10-CM | POA: Diagnosis not present

## 2022-12-11 DIAGNOSIS — Z992 Dependence on renal dialysis: Secondary | ICD-10-CM | POA: Diagnosis not present

## 2022-12-11 DIAGNOSIS — N186 End stage renal disease: Secondary | ICD-10-CM | POA: Diagnosis not present

## 2022-12-11 DIAGNOSIS — D631 Anemia in chronic kidney disease: Secondary | ICD-10-CM | POA: Diagnosis not present

## 2022-12-11 DIAGNOSIS — N2581 Secondary hyperparathyroidism of renal origin: Secondary | ICD-10-CM | POA: Diagnosis not present

## 2022-12-14 DIAGNOSIS — Z992 Dependence on renal dialysis: Secondary | ICD-10-CM | POA: Diagnosis not present

## 2022-12-14 DIAGNOSIS — D631 Anemia in chronic kidney disease: Secondary | ICD-10-CM | POA: Diagnosis not present

## 2022-12-14 DIAGNOSIS — N2581 Secondary hyperparathyroidism of renal origin: Secondary | ICD-10-CM | POA: Diagnosis not present

## 2022-12-14 DIAGNOSIS — D509 Iron deficiency anemia, unspecified: Secondary | ICD-10-CM | POA: Diagnosis not present

## 2022-12-14 DIAGNOSIS — N186 End stage renal disease: Secondary | ICD-10-CM | POA: Diagnosis not present

## 2022-12-15 ENCOUNTER — Encounter: Payer: Self-pay | Admitting: Vascular Surgery

## 2022-12-15 ENCOUNTER — Encounter
Admission: RE | Disposition: A | Discharge status: Home / Self Care | Payer: Self-pay | Source: Home / Self Care | Attending: Vascular Surgery

## 2022-12-15 ENCOUNTER — Ambulatory Visit
Admission: RE | Admit: 2022-12-15 | Discharge: 2022-12-15 | Disposition: A | Discharge status: Home / Self Care | Payer: Medicare Other | Attending: Vascular Surgery | Admitting: Vascular Surgery

## 2022-12-15 ENCOUNTER — Other Ambulatory Visit: Payer: Self-pay

## 2022-12-15 DIAGNOSIS — I120 Hypertensive chronic kidney disease with stage 5 chronic kidney disease or end stage renal disease: Secondary | ICD-10-CM | POA: Insufficient documentation

## 2022-12-15 DIAGNOSIS — Z992 Dependence on renal dialysis: Secondary | ICD-10-CM | POA: Diagnosis not present

## 2022-12-15 DIAGNOSIS — T82858A Stenosis of vascular prosthetic devices, implants and grafts, initial encounter: Secondary | ICD-10-CM | POA: Diagnosis not present

## 2022-12-15 DIAGNOSIS — Y832 Surgical operation with anastomosis, bypass or graft as the cause of abnormal reaction of the patient, or of later complication, without mention of misadventure at the time of the procedure: Secondary | ICD-10-CM | POA: Diagnosis not present

## 2022-12-15 DIAGNOSIS — N186 End stage renal disease: Secondary | ICD-10-CM | POA: Insufficient documentation

## 2022-12-15 DIAGNOSIS — E1122 Type 2 diabetes mellitus with diabetic chronic kidney disease: Secondary | ICD-10-CM | POA: Diagnosis not present

## 2022-12-15 HISTORY — PX: A/V FISTULAGRAM: CATH118298

## 2022-12-15 HISTORY — DX: Cystic disease of liver: Q44.6

## 2022-12-15 LAB — GLUCOSE, CAPILLARY: Glucose-Capillary: 88 mg/dL (ref 70–99)

## 2022-12-15 LAB — POTASSIUM (ARMC VASCULAR LAB ONLY): Potassium (ARMC vascular lab): 3.5 mmol/L (ref 3.5–5.1)

## 2022-12-15 SURGERY — A/V FISTULAGRAM
Anesthesia: Moderate Sedation | Laterality: Left

## 2022-12-15 MED ORDER — MIDAZOLAM HCL 2 MG/ML PO SYRP: 8.0000 mg | ORAL_SOLUTION | Freq: Once | ORAL | Status: DC | PRN

## 2022-12-15 MED ORDER — MIDAZOLAM HCL 5 MG/5ML IJ SOLN
INTRAMUSCULAR | Status: AC
  Filled 2022-12-15: qty 5

## 2022-12-15 MED ORDER — CEFAZOLIN SODIUM-DEXTROSE 1-4 GM/50ML-% IV SOLN
INTRAVENOUS | Status: AC
  Filled 2022-12-15: qty 50

## 2022-12-15 MED ORDER — DIPHENHYDRAMINE HCL 50 MG/ML IJ SOLN: 50.0000 mg | Freq: Once | INTRAMUSCULAR | Status: DC | PRN

## 2022-12-15 MED ORDER — HYDROMORPHONE HCL 1 MG/ML IJ SOLN: 1.0000 mg | Freq: Once | INTRAMUSCULAR | Status: DC | PRN

## 2022-12-15 MED ORDER — HEPARIN SODIUM (PORCINE) 1000 UNIT/ML IJ SOLN
INTRAMUSCULAR | Status: AC
  Filled 2022-12-15: qty 10

## 2022-12-15 MED ORDER — CEFAZOLIN SODIUM-DEXTROSE 1-4 GM/50ML-% IV SOLN
1.0000 g | INTRAVENOUS | Status: AC
  Administered 2022-12-15: 1 g via INTRAVENOUS

## 2022-12-15 MED ORDER — METHYLPREDNISOLONE SODIUM SUCC 125 MG IJ SOLR: 125.0000 mg | Freq: Once | INTRAMUSCULAR | Status: DC | PRN

## 2022-12-15 MED ORDER — MIDAZOLAM HCL 2 MG/2ML IJ SOLN
INTRAMUSCULAR | Status: DC | PRN
  Administered 2022-12-15 (×2): 1 mg via INTRAVENOUS

## 2022-12-15 MED ORDER — SODIUM CHLORIDE 0.9 % IV SOLN: INTRAVENOUS | Status: DC

## 2022-12-15 MED ORDER — HEPARIN SODIUM (PORCINE) 1000 UNIT/ML IJ SOLN
INTRAMUSCULAR | Status: DC | PRN
  Administered 2022-12-15: 5000 [IU] via INTRAVENOUS

## 2022-12-15 MED ORDER — ONDANSETRON HCL 4 MG/2ML IJ SOLN: 4.0000 mg | Freq: Four times a day (QID) | INTRAMUSCULAR | Status: DC | PRN

## 2022-12-15 MED ORDER — FENTANYL CITRATE (PF) 100 MCG/2ML IJ SOLN
INTRAMUSCULAR | Status: AC
  Filled 2022-12-15: qty 2

## 2022-12-15 MED ORDER — FENTANYL CITRATE (PF) 100 MCG/2ML IJ SOLN
INTRAMUSCULAR | Status: DC | PRN
  Administered 2022-12-15: 50 ug via INTRAVENOUS

## 2022-12-15 MED ORDER — FAMOTIDINE 20 MG PO TABS: 40.0000 mg | ORAL_TABLET | Freq: Once | ORAL | Status: DC | PRN

## 2022-12-15 SURGICAL SUPPLY — 18 items
BALLN DORADO 8X60X80 (BALLOONS) ×1
BALLN ULTRVRSE 10X40X75 (BALLOONS) ×1
BALLOON DORADO 8X60X80 (BALLOONS) IMPLANT
BALLOON ULTRVRSE 10X40X75 (BALLOONS) IMPLANT
COVER PROBE ULTRASOUND 5X96 (MISCELLANEOUS) IMPLANT
DRAPE BRACHIAL (DRAPES) IMPLANT
GOWN STRL REUS W/ TWL LRG LVL3 (GOWN DISPOSABLE) ×1 IMPLANT
GOWN STRL REUS W/TWL LRG LVL3 (GOWN DISPOSABLE) ×1
KIT ENCORE 26 ADVANTAGE (KITS) IMPLANT
NDL ENTRY 21GA 7CM ECHOTIP (NEEDLE) IMPLANT
NEEDLE ENTRY 21GA 7CM ECHOTIP (NEEDLE) ×1 IMPLANT
PACK ANGIOGRAPHY (CUSTOM PROCEDURE TRAY) ×1 IMPLANT
SET INTRO CAPELLA COAXIAL (SET/KITS/TRAYS/PACK) IMPLANT
SHEATH BRITE TIP 6FRX5.5 (SHEATH) IMPLANT
SHEATH BRITE TIP 7FRX5.5 (SHEATH) IMPLANT
STENT COVERA FLARED 9X60X80 (Permanent Stent) IMPLANT
SUT MNCRL AB 4-0 PS2 18 (SUTURE) IMPLANT
WIRE SUPRACORE 190CM (WIRE) IMPLANT

## 2022-12-15 NOTE — Op Note (Signed)
OPERATIVE NOTE   PROCEDURE: Contrast injection left basilic transposition AV access Percutaneous transluminal angioplasty and stent placement peripheral segment left basilic transposition  PRE-OPERATIVE DIAGNOSIS: Complication of dialysis access                                                       End Stage Renal Disease  POST-OPERATIVE DIAGNOSIS: same as above   SURGEON: Renford Dills, M.D.  ANESTHESIA: Conscious sedation was administered under my direct supervision by the interventional radiology RN. IV Versed plus fentanyl were utilized. Continuous ECG, pulse oximetry and blood pressure was monitored throughout the entire procedure.  Conscious sedation was for a total of 22 minutes.  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 male who  presents with malfunctioning left basilic transposition AV access.  The patient is scheduled for angiography with possible intervention of the AV access.  The patient is aware the risks include but are not limited to: bleeding, infection, thrombosis of the cannulated access, and possible anaphylactic reaction to the contrast.  The patient acknowledges if the access can not be salvaged a tunneled catheter will be needed and will be placed during this procedure.  The patient is aware of the risks of the procedure and elects to proceed with the angiogram and intervention.  DESCRIPTION: After full informed written consent was obtained, the patient was brought back to the Special Procedure suite and placed supine position.  Appropriate cardiopulmonary monitors were placed.  The left arm was prepped and draped in the standard fashion.  Appropriate timeout is called. The left basilic transposition was cannulated with a micropuncture needle.  Cannulation was performed  with ultrasound guidance. Ultrasound was placed in a sterile sleeve, the AV access was interrogated and noted to be echolucent and compressible indicating patency. Image was recorded for the permanent record. The puncture is performed under continuous ultrasound visualization.   The microwire was advanced and the needle was exchanged for  a microsheath.  The J-wire was then advanced and a 6 Fr sheath inserted.  Hand injections were completed to image the access from the arterial anastomosis through the entire access.  The central venous structures were also imaged by hand injections.  Based on the images,  5000 units of heparin was given and a wire was negotiated through the strictures within the venous portion of the graft.  An 9 mm x 60 mm Covera was deployed across the stenoses and postdilated with an 8 mm Dorado balloon and then a 10 mm Ultraverse balloon inflated to 16 atm for approximately 1 minute.  Follow-up imaging demonstrates complete resolution of the stricture with rapid flow of contrast through the graft, the central venous anatomy is preserved.  A 4-0 Monocryl purse-string suture was sewn around the sheath.  The sheath was removed and light pressure was applied.  A sterile bandage was applied to the puncture site.   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  Axillary subclavian and Ondemet and superior vena cava are widely patent.  Arterial inflow is widely patent.  After angioplasty and stent  placement there is less than 5% residual stenosis with rapid flow of contrast through the fistula.  COMPLICATIONS: None  CONDITION: [name], M.D [facility] Office: [phone]  12/15/2022 2:24 PM

## 2022-12-15 NOTE — Interval H&P Note (Signed)
History and Physical Interval Note:  12/15/2022 1:36 PM  Mark Bauer  has presented today for surgery, with the diagnosis of L arm fistulagram   End Stage Renal.  The various methods of treatment have been discussed with the patient and family. After consideration of risks, benefits and other options for treatment, the patient has consented to  Procedure(s): A/V Fistulagram (Left) as a surgical intervention.  The patient's history has been reviewed, patient examined, no change in status, stable for surgery.  I have reviewed the patient's chart and labs.  Questions were answered to the patient's satisfaction.     Levora Dredge

## 2022-12-16 ENCOUNTER — Encounter: Payer: Self-pay | Admitting: Vascular Surgery

## 2022-12-16 DIAGNOSIS — Z992 Dependence on renal dialysis: Secondary | ICD-10-CM | POA: Diagnosis not present

## 2022-12-16 DIAGNOSIS — N2581 Secondary hyperparathyroidism of renal origin: Secondary | ICD-10-CM | POA: Diagnosis not present

## 2022-12-16 DIAGNOSIS — D631 Anemia in chronic kidney disease: Secondary | ICD-10-CM | POA: Diagnosis not present

## 2022-12-16 DIAGNOSIS — N186 End stage renal disease: Secondary | ICD-10-CM | POA: Diagnosis not present

## 2022-12-16 DIAGNOSIS — D509 Iron deficiency anemia, unspecified: Secondary | ICD-10-CM | POA: Diagnosis not present

## 2022-12-18 DIAGNOSIS — D509 Iron deficiency anemia, unspecified: Secondary | ICD-10-CM | POA: Diagnosis not present

## 2022-12-18 DIAGNOSIS — Z992 Dependence on renal dialysis: Secondary | ICD-10-CM | POA: Diagnosis not present

## 2022-12-18 DIAGNOSIS — Q612 Polycystic kidney, adult type: Secondary | ICD-10-CM | POA: Diagnosis not present

## 2022-12-18 DIAGNOSIS — N2581 Secondary hyperparathyroidism of renal origin: Secondary | ICD-10-CM | POA: Diagnosis not present

## 2022-12-18 DIAGNOSIS — N186 End stage renal disease: Secondary | ICD-10-CM | POA: Diagnosis not present

## 2022-12-18 DIAGNOSIS — D631 Anemia in chronic kidney disease: Secondary | ICD-10-CM | POA: Diagnosis not present

## 2022-12-21 DIAGNOSIS — Z992 Dependence on renal dialysis: Secondary | ICD-10-CM | POA: Diagnosis not present

## 2022-12-21 DIAGNOSIS — N2581 Secondary hyperparathyroidism of renal origin: Secondary | ICD-10-CM | POA: Diagnosis not present

## 2022-12-21 DIAGNOSIS — N186 End stage renal disease: Secondary | ICD-10-CM | POA: Diagnosis not present

## 2022-12-21 DIAGNOSIS — D631 Anemia in chronic kidney disease: Secondary | ICD-10-CM | POA: Diagnosis not present

## 2022-12-21 DIAGNOSIS — Z23 Encounter for immunization: Secondary | ICD-10-CM | POA: Diagnosis not present

## 2022-12-23 DIAGNOSIS — N186 End stage renal disease: Secondary | ICD-10-CM | POA: Diagnosis not present

## 2022-12-23 DIAGNOSIS — Z23 Encounter for immunization: Secondary | ICD-10-CM | POA: Diagnosis not present

## 2022-12-23 DIAGNOSIS — D631 Anemia in chronic kidney disease: Secondary | ICD-10-CM | POA: Diagnosis not present

## 2022-12-23 DIAGNOSIS — Z992 Dependence on renal dialysis: Secondary | ICD-10-CM | POA: Diagnosis not present

## 2022-12-23 DIAGNOSIS — N2581 Secondary hyperparathyroidism of renal origin: Secondary | ICD-10-CM | POA: Diagnosis not present

## 2022-12-25 DIAGNOSIS — N2581 Secondary hyperparathyroidism of renal origin: Secondary | ICD-10-CM | POA: Diagnosis not present

## 2022-12-25 DIAGNOSIS — Z992 Dependence on renal dialysis: Secondary | ICD-10-CM | POA: Diagnosis not present

## 2022-12-25 DIAGNOSIS — Z23 Encounter for immunization: Secondary | ICD-10-CM | POA: Diagnosis not present

## 2022-12-25 DIAGNOSIS — N186 End stage renal disease: Secondary | ICD-10-CM | POA: Diagnosis not present

## 2022-12-25 DIAGNOSIS — D631 Anemia in chronic kidney disease: Secondary | ICD-10-CM | POA: Diagnosis not present

## 2022-12-28 DIAGNOSIS — N2581 Secondary hyperparathyroidism of renal origin: Secondary | ICD-10-CM | POA: Diagnosis not present

## 2022-12-28 DIAGNOSIS — D631 Anemia in chronic kidney disease: Secondary | ICD-10-CM | POA: Diagnosis not present

## 2022-12-28 DIAGNOSIS — N186 End stage renal disease: Secondary | ICD-10-CM | POA: Diagnosis not present

## 2022-12-28 DIAGNOSIS — Z23 Encounter for immunization: Secondary | ICD-10-CM | POA: Diagnosis not present

## 2022-12-28 DIAGNOSIS — Z992 Dependence on renal dialysis: Secondary | ICD-10-CM | POA: Diagnosis not present

## 2022-12-30 DIAGNOSIS — D631 Anemia in chronic kidney disease: Secondary | ICD-10-CM | POA: Diagnosis not present

## 2022-12-30 DIAGNOSIS — N186 End stage renal disease: Secondary | ICD-10-CM | POA: Diagnosis not present

## 2022-12-30 DIAGNOSIS — Z23 Encounter for immunization: Secondary | ICD-10-CM | POA: Diagnosis not present

## 2022-12-30 DIAGNOSIS — N2581 Secondary hyperparathyroidism of renal origin: Secondary | ICD-10-CM | POA: Diagnosis not present

## 2022-12-30 DIAGNOSIS — Z992 Dependence on renal dialysis: Secondary | ICD-10-CM | POA: Diagnosis not present

## 2023-01-01 DIAGNOSIS — N2581 Secondary hyperparathyroidism of renal origin: Secondary | ICD-10-CM | POA: Diagnosis not present

## 2023-01-01 DIAGNOSIS — N186 End stage renal disease: Secondary | ICD-10-CM | POA: Diagnosis not present

## 2023-01-01 DIAGNOSIS — Z23 Encounter for immunization: Secondary | ICD-10-CM | POA: Diagnosis not present

## 2023-01-01 DIAGNOSIS — Z992 Dependence on renal dialysis: Secondary | ICD-10-CM | POA: Diagnosis not present

## 2023-01-01 DIAGNOSIS — D631 Anemia in chronic kidney disease: Secondary | ICD-10-CM | POA: Diagnosis not present

## 2023-01-04 DIAGNOSIS — Z992 Dependence on renal dialysis: Secondary | ICD-10-CM | POA: Diagnosis not present

## 2023-01-04 DIAGNOSIS — D631 Anemia in chronic kidney disease: Secondary | ICD-10-CM | POA: Diagnosis not present

## 2023-01-04 DIAGNOSIS — N186 End stage renal disease: Secondary | ICD-10-CM | POA: Diagnosis not present

## 2023-01-04 DIAGNOSIS — Z23 Encounter for immunization: Secondary | ICD-10-CM | POA: Diagnosis not present

## 2023-01-04 DIAGNOSIS — N2581 Secondary hyperparathyroidism of renal origin: Secondary | ICD-10-CM | POA: Diagnosis not present

## 2023-01-05 ENCOUNTER — Ambulatory Visit
Admission: RE | Admit: 2023-01-05 | Discharge: 2023-01-05 | Disposition: A | Discharge status: Home / Self Care | Payer: Medicare Other | Source: Ambulatory Visit | Attending: Family Medicine | Admitting: Family Medicine

## 2023-01-05 ENCOUNTER — Encounter: Payer: Self-pay | Admitting: Family Medicine

## 2023-01-05 ENCOUNTER — Ambulatory Visit
Admission: RE | Admit: 2023-01-05 | Discharge: 2023-01-05 | Disposition: A | Discharge status: Home / Self Care | Payer: Medicare Other | Attending: Family Medicine | Admitting: Family Medicine

## 2023-01-05 ENCOUNTER — Ambulatory Visit (INDEPENDENT_AMBULATORY_CARE_PROVIDER_SITE_OTHER): Payer: Medicare Other | Admitting: Family Medicine

## 2023-01-05 VITALS — BP 110/70 | HR 70 | Ht 72.0 in | Wt 159.0 lb

## 2023-01-05 DIAGNOSIS — M25561 Pain in right knee: Secondary | ICD-10-CM | POA: Diagnosis not present

## 2023-01-05 DIAGNOSIS — M21371 Foot drop, right foot: Secondary | ICD-10-CM

## 2023-01-05 DIAGNOSIS — M19071 Primary osteoarthritis, right ankle and foot: Secondary | ICD-10-CM | POA: Diagnosis not present

## 2023-01-05 DIAGNOSIS — R600 Localized edema: Secondary | ICD-10-CM | POA: Diagnosis not present

## 2023-01-05 DIAGNOSIS — R300 Dysuria: Secondary | ICD-10-CM | POA: Diagnosis not present

## 2023-01-05 DIAGNOSIS — M25571 Pain in right ankle and joints of right foot: Secondary | ICD-10-CM | POA: Diagnosis not present

## 2023-01-05 MED ORDER — PREDNISONE 50 MG PO TABS
50.0000 mg | ORAL_TABLET | Freq: Every day | ORAL | 0 refills | Status: DC
Start: 2023-01-05 — End: 2023-01-14

## 2023-01-05 MED ORDER — GABAPENTIN 100 MG PO CAPS
100.0000 mg | ORAL_CAPSULE | Freq: Every day | ORAL | 0 refills | Status: DC
Start: 2023-01-05 — End: 2023-01-14

## 2023-01-05 NOTE — Assessment & Plan Note (Signed)
Patient presents with acute and atraumatic right foot drop/inability to dorsiflex.  Noted upon awakening on 6/16, denies any trauma or change in activity in the preceding days.  Denies any similar symptomatology in the past.  Does have known lumbosacral degenerative changes as seen on x-rays dated 05/31/2014, additionally benign right foot and ankle x-rays from 02/16/2011.  Examination today with findings of chronic venous stasis about the lower leg, negative Homans, nontender fibular head with no crepitus, positive modified straight leg raise on the right, inability to dorsiflex against resistance and against gravity, all other motions intact, otherwise sensorimotor reassuring.  Patient with acute right foot drop in the setting of known lumbosacral spondyloarthropathy, plan as follows:  - Updated x-rays ordered today - Prednisone course - Nightly gabapentin (discussed prolonged activity due to ESRD on dialysis), can trial this if tolerable - Status update next week when patient returns to see PCP Dr. Yetta Barre for annual physical - If no progress noted, referral to neurology and EMG studies to be coordinated - If progressing, can consider formal versus home-based PT, modify gabapentin accordingly

## 2023-01-05 NOTE — Progress Notes (Signed)
     Primary Care / Sports Medicine Office Visit  Patient Information:  Patient ID: Mark Bauer, male DOB: November 12, 1955 Age: 67 y.o. MRN: [id number]   Mark Bauer is a pleasant 67 y.o. male presenting with the following:  Chief Complaint  Patient presents with   Foot Pain    Right, started Sunday woke up and it was "froze"     Vitals:   01/05/23 1438  BP: 110/70  Pulse: 70  SpO2: 99%   Vitals:   01/05/23 1438  Weight: 159 lb (72.1 kg)  Height: 6' (1.829 m)   Body mass index is 21.56 kg/m.  PERIPHERAL VASCULAR CATHETERIZATION  Result Date: 12/15/2022 See surgical note for result.    Independent interpretation of notes and tests performed by another provider:   None  Procedures performed:   None  Pertinent History, Exam, Impression, and Recommendations:   Mark Bauer was seen today for foot pain.  Right foot drop Overview: Date of onset: 01/03/2023  Assessment & Plan: Patient presents with acute and atraumatic right foot drop/inability to dorsiflex.  Noted upon awakening on 6/16, denies any trauma or change in activity in the preceding days.  Denies any similar symptomatology in the past.  Does have known lumbosacral degenerative changes as seen on x-rays dated 05/31/2014, additionally benign right foot and ankle x-rays from 02/16/2011.  Examination today with findings of chronic venous stasis about the lower leg, negative Homans, nontender fibular head with no crepitus, positive modified straight leg raise on the right, inability to dorsiflex against resistance and against gravity, all other motions intact, otherwise sensorimotor reassuring.  Patient with acute right foot drop in the setting of known lumbosacral spondyloarthropathy, plan as follows:  - Updated x-rays ordered today - Prednisone course - Nightly gabapentin (discussed prolonged activity due to ESRD on dialysis), can trial this if tolerable - Status update next week when patient returns to see PCP  Dr. Yetta Barre for annual physical - If no progress noted, referral to neurology and EMG studies to be coordinated - If progressing, can consider formal versus home-based PT, modify gabapentin accordingly  Orders: -     predniSONE; Take 1 tablet (50 mg total) by mouth daily.  Dispense: 5 tablet; Refill: 0 -     Gabapentin; Take 1 capsule (100 mg total) by mouth at bedtime.  Dispense: 14 capsule; Refill: 0 -     DG Lumbar Spine Complete; Future -     DG Foot Complete Right; Future -     DG Ankle Complete Right; Future -     DG Knee Complete 4 Views Right; Future     Orders & Medications Meds ordered this encounter  Medications   predniSONE (DELTASONE) 50 MG tablet    Sig: Take 1 tablet (50 mg total) by mouth daily.    Dispense:  5 tablet    Refill:  0   gabapentin (NEURONTIN) 100 MG capsule    Sig: Take 1 capsule (100 mg total) by mouth at bedtime.    Dispense:  14 capsule    Refill:  0   Orders Placed This Encounter  Procedures   DG Lumbar Spine Complete   DG Foot Complete Right   DG Ankle Complete Right   DG Knee Complete 4 Views Right     No follow-ups on file.     Jerrol Banana, MD, [facility]   Primary Care Sports Medicine Primary Care and Sports Medicine at [facility]

## 2023-01-05 NOTE — Patient Instructions (Signed)
-   Obtain x-rays today - Start prednisone (steroid), take for full course - Dose gabapentin (nerve medication) every evening, side effect can be drowsiness - Provide status update next week during your return visit to see Dr. Yetta Barre - Contact us for any question/concerns between now and then

## 2023-01-06 ENCOUNTER — Telehealth: Payer: Self-pay | Admitting: Family Medicine

## 2023-01-06 DIAGNOSIS — N186 End stage renal disease: Secondary | ICD-10-CM | POA: Diagnosis not present

## 2023-01-06 DIAGNOSIS — Z23 Encounter for immunization: Secondary | ICD-10-CM | POA: Diagnosis not present

## 2023-01-06 DIAGNOSIS — N2581 Secondary hyperparathyroidism of renal origin: Secondary | ICD-10-CM | POA: Diagnosis not present

## 2023-01-06 DIAGNOSIS — Z992 Dependence on renal dialysis: Secondary | ICD-10-CM | POA: Diagnosis not present

## 2023-01-06 DIAGNOSIS — D631 Anemia in chronic kidney disease: Secondary | ICD-10-CM | POA: Diagnosis not present

## 2023-01-06 NOTE — Telephone Encounter (Signed)
Copied from CRM [id number]. Topic: Medicare AWV >> Jan 06, 2023  2:24 PM Payton Doughty wrote: Reason for CRM: LM 01/06/2023 to schedule AWV   Verlee Rossetti; Care Guide Ambulatory Clinical Support [facility] Direct Dial: [phone]

## 2023-01-08 DIAGNOSIS — D631 Anemia in chronic kidney disease: Secondary | ICD-10-CM | POA: Diagnosis not present

## 2023-01-08 DIAGNOSIS — N186 End stage renal disease: Secondary | ICD-10-CM | POA: Diagnosis not present

## 2023-01-08 DIAGNOSIS — N2581 Secondary hyperparathyroidism of renal origin: Secondary | ICD-10-CM | POA: Diagnosis not present

## 2023-01-08 DIAGNOSIS — Z992 Dependence on renal dialysis: Secondary | ICD-10-CM | POA: Diagnosis not present

## 2023-01-08 DIAGNOSIS — Z23 Encounter for immunization: Secondary | ICD-10-CM | POA: Diagnosis not present

## 2023-01-11 ENCOUNTER — Other Ambulatory Visit (INDEPENDENT_AMBULATORY_CARE_PROVIDER_SITE_OTHER): Payer: Self-pay | Admitting: Vascular Surgery

## 2023-01-11 DIAGNOSIS — D631 Anemia in chronic kidney disease: Secondary | ICD-10-CM | POA: Diagnosis not present

## 2023-01-11 DIAGNOSIS — N186 End stage renal disease: Secondary | ICD-10-CM

## 2023-01-11 DIAGNOSIS — N2581 Secondary hyperparathyroidism of renal origin: Secondary | ICD-10-CM | POA: Diagnosis not present

## 2023-01-11 DIAGNOSIS — Z992 Dependence on renal dialysis: Secondary | ICD-10-CM | POA: Diagnosis not present

## 2023-01-11 DIAGNOSIS — Z23 Encounter for immunization: Secondary | ICD-10-CM | POA: Diagnosis not present

## 2023-01-13 DIAGNOSIS — N2581 Secondary hyperparathyroidism of renal origin: Secondary | ICD-10-CM | POA: Diagnosis not present

## 2023-01-13 DIAGNOSIS — D631 Anemia in chronic kidney disease: Secondary | ICD-10-CM | POA: Diagnosis not present

## 2023-01-13 DIAGNOSIS — Z23 Encounter for immunization: Secondary | ICD-10-CM | POA: Diagnosis not present

## 2023-01-13 DIAGNOSIS — Z992 Dependence on renal dialysis: Secondary | ICD-10-CM | POA: Diagnosis not present

## 2023-01-13 DIAGNOSIS — N186 End stage renal disease: Secondary | ICD-10-CM | POA: Diagnosis not present

## 2023-01-14 ENCOUNTER — Ambulatory Visit (INDEPENDENT_AMBULATORY_CARE_PROVIDER_SITE_OTHER): Payer: Medicare Other | Admitting: Family Medicine

## 2023-01-14 ENCOUNTER — Telehealth: Payer: Self-pay | Admitting: Family Medicine

## 2023-01-14 ENCOUNTER — Encounter: Payer: Self-pay | Admitting: Family Medicine

## 2023-01-14 ENCOUNTER — Encounter (INDEPENDENT_AMBULATORY_CARE_PROVIDER_SITE_OTHER): Payer: Self-pay | Admitting: Nurse Practitioner

## 2023-01-14 ENCOUNTER — Ambulatory Visit (INDEPENDENT_AMBULATORY_CARE_PROVIDER_SITE_OTHER): Payer: Medicare Other | Admitting: Nurse Practitioner

## 2023-01-14 ENCOUNTER — Ambulatory Visit (INDEPENDENT_AMBULATORY_CARE_PROVIDER_SITE_OTHER): Payer: Medicare Other

## 2023-01-14 VITALS — BP 99/60 | HR 103 | Resp 18 | Ht 72.0 in | Wt 162.4 lb

## 2023-01-14 VITALS — BP 120/62 | HR 88 | Ht 72.0 in | Wt 163.0 lb

## 2023-01-14 DIAGNOSIS — I890 Lymphedema, not elsewhere classified: Secondary | ICD-10-CM | POA: Diagnosis not present

## 2023-01-14 DIAGNOSIS — K219 Gastro-esophageal reflux disease without esophagitis: Secondary | ICD-10-CM

## 2023-01-14 DIAGNOSIS — E1121 Type 2 diabetes mellitus with diabetic nephropathy: Secondary | ICD-10-CM

## 2023-01-14 DIAGNOSIS — I10 Essential (primary) hypertension: Secondary | ICD-10-CM

## 2023-01-14 DIAGNOSIS — N186 End stage renal disease: Secondary | ICD-10-CM

## 2023-01-14 DIAGNOSIS — Z7985 Long-term (current) use of injectable non-insulin antidiabetic drugs: Secondary | ICD-10-CM

## 2023-01-14 DIAGNOSIS — M21371 Foot drop, right foot: Secondary | ICD-10-CM

## 2023-01-14 MED ORDER — PANTOPRAZOLE SODIUM 40 MG PO TBEC: 40.0000 mg | DELAYED_RELEASE_TABLET | Freq: Every day | ORAL | 1 refills | Status: DC

## 2023-01-14 MED ORDER — TRULICITY 0.75 MG/0.5ML ~~LOC~~ SOAJ: SUBCUTANEOUS | 1 refills | Status: DC

## 2023-01-14 NOTE — Progress Notes (Signed)
Subjective:    Patient ID: Mark Bauer, male    DOB: [birth year], 67 y.o.   MRN: [id number] Chief Complaint  Patient presents with   Follow-up    Follow up 6 month HDA    The patient returns to the office for followup status post intervention of their dialysis access on 12/15/2022 of the left brachiobasilic AV fistula.   Following the intervention the access function has significantly improved, with better flow rates and improved KT/V. The patient has not been experiencing increased bleeding times following decannulation and the patient denies increased recirculation. The patient denies an increase in arm swelling. At the present time the patient denies hand pain.  No recent shortening of the patient's walking distance or new symptoms consistent with claudication.  No history of rest pain symptoms. No new ulcers or wounds of the lower extremities have occurred.  The patient denies amaurosis fugax or recent TIA symptoms. There are no recent neurological changes noted. There is no history of DVT, PE or superficial thrombophlebitis. No recent episodes of angina or shortness of breath documented.   Duplex ultrasound of the AV access shows a patent access.  The previously noted stenosis is improved compared to last study.  Flow volume today is 3267 cc/min (previous flow volume was 1401 cc/min)       Review of Systems  Cardiovascular:  Positive for leg swelling.  Hematological:  Does not bruise/bleed easily.  All other systems reviewed and are negative.      Objective:   Physical Exam Vitals reviewed.  HENT:     Head: Normocephalic.  Cardiovascular:     Rate and Rhythm: Normal rate.     Pulses:          Radial pulses are 2+ on the left side.     Arteriovenous access: Left arteriovenous access is present.    Comments: Good thrill and bruit Pulmonary:     Effort: Pulmonary effort is normal.  Skin:    General: Skin is warm and dry.  Neurological:     Mental Status: He is  alert and oriented to person, place, and time.  Psychiatric:        Mood and Affect: Mood normal.        Behavior: Behavior normal.        Thought Content: Thought content normal.        Judgment: Judgment normal.     BP 99/60 (BP Location: Right Arm)   Pulse (!) 103   Resp 18   Ht 6' (1.829 m)   Wt 162 lb 6.4 oz (73.7 kg)   BMI 22.03 kg/m   Past Medical History:  Diagnosis Date   Allergy    Bilateral undescended testicles    Cancer (HCC)    melanoma on forehead   Cataract    OU   Diabetes mellitus without complication (HCC)    Dysrhythmia    a fib   GERD (gastroesophageal reflux disease)    Gout    Hepatitis    polycystic    Hypertension    Hypokalemia    Mixed hyperlipidemia    Polycystic kidney    Polycystic liver disease    Vitamin D deficiency     Social History   Socioeconomic History   Marital status: Single    Spouse name: Not on file   Number of children: 0   Years of education: Not on file   Highest education level: Associate degree: occupational, Scientist, product/process development, or vocational program  Occupational History   Occupation: retired  Tobacco Use   Smoking status: Never   Smokeless tobacco: Never  Vaping Use   Vaping Use: Never used  Substance and Sexual Activity   Alcohol use: No   Drug use: No   Sexual activity: Never  Other Topics Concern   Not on file  Social History Narrative   Never married, no children, lives alone.   Social Determinants of Health   Financial Resource Strain: Low Risk  (01/10/2023)   Overall Financial Resource Strain (CARDIA)    Difficulty of Paying Living Expenses: Not very hard  Food Insecurity: No Food Insecurity (01/10/2023)   Hunger Vital Sign    Worried About Running Out of Food in the Last Year: Never true    Ran Out of Food in the Last Year: Never true  Transportation Needs: No Transportation Needs (01/10/2023)   PRAPARE - Administrator, Civil Service (Medical): No    Lack of Transportation  (Non-Medical): No  Physical Activity: Insufficiently Active (01/10/2023)   Exercise Vital Sign    Days of Exercise per Week: 2 days    Minutes of Exercise per Session: 10 min  Stress: No Stress Concern Present (01/10/2023)   [employer] of Occupational Health - Occupational Stress Questionnaire    Feeling of Stress : Not at all  Social Connections: Unknown (01/10/2023)   Social Connection and Isolation Panel [NHANES]    Frequency of Communication with Friends and Family: More than three times a week    Frequency of Social Gatherings with Friends and Family: Patient declined    Attends Religious Services: Patient declined    Active Member of Clubs or Organizations: No    Attends Banker Meetings: Never    Marital Status: Never married  Intimate Partner Violence: Not At Risk (01/14/2022)   Humiliation, Afraid, Rape, and Kick questionnaire    Fear of Current or Ex-Partner: No    Emotionally Abused: No    Physically Abused: No    Sexually Abused: No    Past Surgical History:  Procedure Laterality Date   A/V FISTULAGRAM Left 12/15/2022   Procedure: A/V Fistulagram;  Surgeon: Renford Dills, MD;  Location: [facility] INVASIVE [facility];  Service: Cardiovascular;  Laterality: Left;   BASCILIC VEIN TRANSPOSITION Left 02/23/2020   Procedure: BASCILIC VEIN TRANSPOSITION (SINGLE STAGE);  Surgeon: Renford Dills, MD;  Location: [facility] ORS;  Service: Vascular;  Laterality: Left;   COLONOSCOPY  2012   hemorrhoids   melanoma removal     forehead    Family History  Problem Relation Age of Onset   Diabetes Mother    Heart disease Father     Allergies  Allergen Reactions   Ciprofloxacin Other (See Comments)    hallucination   Pollen Extract Other (See Comments)    Sneezing, watery eyes, etc.   Caffeine Palpitations    palpatations       Latest Ref Rng & Units 09/12/2021   10:36 AM 02/16/2020    2:34 PM 01/09/2019   12:06 PM  CBC  WBC 3.4 - 10.8 x10E3/uL 5.9  6.2  7.1    Hemoglobin 13.0 - 17.7 g/dL 16.1  09.6  04.5   Hematocrit 37.5 - 51.0 % 30.8  37.1  38.2   Platelets 150 - 450 x10E3/uL 119  157  172       CMP     Component Value Date/Time   NA 144 09/14/2022 1120   K 5.0 09/14/2022 1120  CL 104 09/14/2022 1120   CO2 19 (L) 09/14/2022 1120   GLUCOSE 91 09/14/2022 1120   GLUCOSE 109 (H) 02/16/2020 1434   BUN 101 (HH) 09/14/2022 1120   CREATININE 9.53 (H) 09/14/2022 1120   CALCIUM 10.4 (H) 09/14/2022 1120   PROT 5.8 (L) 09/14/2022 1120   ALBUMIN 4.3 09/14/2022 1120   AST 22 09/14/2022 1120   ALT 14 09/14/2022 1120   ALKPHOS 60 09/14/2022 1120   BILITOT 0.8 09/14/2022 1120   EGFR 5 (L) 09/14/2022 1120   GFRNONAA 8 (L) 02/16/2020 1434     No results found.     Assessment & Plan:   1. ESRD (end stage renal disease) (HCC) Recommend:  The patient is doing well and currently has adequate dialysis access. The patient's dialysis center is not reporting any access issues. Flow pattern is stable when compared to the prior ultrasound.  The patient should have a duplex ultrasound of the dialysis access in 6 months. The patient will follow-up with me in the office after each ultrasound    2. Lymphedema The patient's lower extremity edema is doing fairly well.  However he has developed foot drop in the right lower extremity.  I discussed that swelling may worsen due to the foot drop so he should be sure to continue with conservative therapies.  He has an upcoming appointment with his neurologist.  3. Essential hypertension Continue antihypertensive medications as already ordered, these medications have been reviewed and there are no changes at this time.  4. Type 2 diabetes mellitus with diabetic nephropathy, without long-term current use of insulin (HCC) Continue hypoglycemic medications as already ordered, these medications have been reviewed and there are no changes at this time.  Hgb A1C to be monitored as already arranged by primary  service   Current Outpatient Medications on File Prior to Visit  Medication Sig Dispense Refill   allopurinol (ZYLOPRIM) 100 MG tablet Take 1 tablet (100 mg total) by mouth daily. 90 tablet 1   amoxicillin-clavulanate (AUGMENTIN) 500-125 MG tablet Take 500 mg by mouth daily.     blood glucose meter kit and supplies Dispense based on patient and insurance preference. Use up to four times daily as directed. (FOR ICD-10 E11.9). 1 each 0   cyanocobalamin 1000 MCG tablet Take 1,000 mcg by mouth daily.     Dulaglutide (TRULICITY) 0.75 MG/0.5ML SOPN INJECT 0.75 MG SUBCUTANEOUSLY ONE TIME PER WEEK 6 mL 1   ELIQUIS 2.5 MG TABS tablet TAKE 1 TABLET BY MOUTH TWICE A DAY 60 tablet 11   furosemide (LASIX) 20 MG tablet Take 1 tablet (20 mg total) by mouth daily. 30 tablet 0   Lancets (ONETOUCH DELICA PLUS LANCET30G) MISC USE AS DIRECTED EVERY DAY     loratadine (CLARITIN) 10 MG tablet Take 10 mg by mouth at bedtime. otc     lovastatin (MEVACOR) 20 MG tablet Take 1 tablet (20 mg total) by mouth daily. 90 tablet 1   ONETOUCH ULTRA test strip ONE TEST DAILY 100 strip 0   pantoprazole (PROTONIX) 40 MG tablet Take 1 tablet (40 mg total) by mouth daily. 90 tablet 1   spironolactone (ALDACTONE) 25 MG tablet Take 25 mg by mouth daily.     No current facility-administered medications on file prior to visit.    There are no Patient Instructions on file for this visit. No follow-ups on file.   Georgiana Spinner, NP

## 2023-01-14 NOTE — Telephone Encounter (Signed)
    Primary Care / Sports Medicine Note  Patient Information:  Patient ID: Mark Bauer, male DOB: [birth year] Age: 67 y.o. MRN: [id number]    Had an opportunity to see patient today as he was visiting his PCP Dr. Yetta Barre to assess the patient's interval clinical picture following our visit on 01/05/2023. He dosed prednisone and took gabapentin without response, he continues to demonstrate right foot drop.   Plan as follows: - Order EMG - Place referral to Neurology - Patient opted to defer brace / boot citing that he is doing well with ambulation  Will follow peripherally on this issue.  Jerrol Banana, MD, [facility]   Primary Care Sports Medicine Primary Care and Sports Medicine at [facility]

## 2023-01-14 NOTE — Progress Notes (Signed)
Date:  01/14/2023   Name:  Mark Bauer   DOB:  [birth year]   MRN:  [id number]   Chief Complaint: Prediabetes and Gastroesophageal Reflux  Gastroesophageal Reflux He reports no abdominal pain, no chest pain, no coughing, no dysphagia, no heartburn, no nausea, no sore throat or no wheezing. This is a chronic problem. The current episode started more than 1 year ago. The problem occurs constantly. The problem has been gradually improving. The symptoms are aggravated by certain foods. Pertinent negatives include no melena, muscle weakness or weight loss. There are no known risk factors. He has tried a PPI for the symptoms. The treatment provided moderate relief.  Diabetes He presents for his follow-up diabetic visit. He has type 2 diabetes mellitus. His disease course has been stable. There are no hypoglycemic associated symptoms. Pertinent negatives for hypoglycemia include no dizziness, headaches or nervousness/anxiousness. There are no diabetic associated symptoms. Pertinent negatives for diabetes include no chest pain, no polydipsia and no weight loss. There are no hypoglycemic complications. Symptoms are stable. There are no diabetic complications. There are no known risk factors for coronary artery disease. When asked about current treatments, none were reported. He is following a generally healthy diet. Meal planning includes avoidance of concentrated sweets and carbohydrate counting. He participates in exercise intermittently. An ACE inhibitor/angiotensin II receptor blocker is being taken.    Lab Results  Component Value Date   NA 144 09/14/2022   K 5.0 09/14/2022   CO2 19 (L) 09/14/2022   GLUCOSE 91 09/14/2022   BUN 101 (HH) 09/14/2022   CREATININE 9.53 (H) 09/14/2022   CALCIUM 10.4 (H) 09/14/2022   EGFR 5 (L) 09/14/2022   GFRNONAA 8 (L) 02/16/2020   Lab Results  Component Value Date   CHOL 83 (L) 09/14/2022   HDL 32 (L) 09/14/2022   LDLCALC 37 09/14/2022   TRIG 62  09/14/2022   CHOLHDL 3.7 12/16/2016   Lab Results  Component Value Date   TSH 3.040 01/09/2019   Lab Results  Component Value Date   HGBA1C 5.8 (H) 09/14/2022   Lab Results  Component Value Date   WBC 5.9 09/12/2021   HGB 10.0 (L) 09/12/2021   HCT 30.8 (L) 09/12/2021   MCV 103 (H) 09/12/2021   PLT 119 (L) 09/12/2021   Lab Results  Component Value Date   ALT 14 09/14/2022   AST 22 09/14/2022   ALKPHOS 60 09/14/2022   BILITOT 0.8 09/14/2022   No results found for: "25OHVITD2", "25OHVITD3", "VD25OH"   Review of Systems  Constitutional:  Negative for chills, fever and weight loss.  HENT:  Negative for drooling, ear discharge, ear pain and sore throat.   Respiratory:  Negative for cough, shortness of breath and wheezing.   Cardiovascular:  Negative for chest pain, palpitations and leg swelling.  Gastrointestinal:  Negative for abdominal pain, blood in stool, constipation, diarrhea, dysphagia, heartburn, melena and nausea.  Endocrine: Negative for polydipsia.  Genitourinary:  Negative for dysuria, frequency, hematuria and urgency.  Musculoskeletal:  Negative for back pain, myalgias, muscle weakness and neck pain.  Skin:  Negative for rash.  Allergic/Immunologic: Negative for environmental allergies.  Neurological:  Negative for dizziness and headaches.  Hematological:  Does not bruise/bleed easily.  Psychiatric/Behavioral:  Negative for suicidal ideas. The patient is not nervous/anxious.     Patient Active Problem List   Diagnosis Date Noted   Right foot drop 01/05/2023   End stage renal disease (HCC) 06/04/2021   Anticoagulated 05/12/2021  Complication of vascular access for dialysis 06/06/2020   Fistula 02/23/2020   Polycystic liver disease 01/10/2020   Polycystic kidney disease 03/01/2019   Metabolic acidosis 09/13/2018   CKD stage 4 due to type 2 diabetes mellitus (HCC) 01/25/2018   Type 2 diabetes mellitus with diabetic nephropathy, without long-term current use  of insulin (HCC) 01/25/2018   Secondary hyperparathyroidism of renal origin (HCC) 08/19/2017   Chronic atrial fibrillation (HCC) 01/06/2017   Essential hypertension 01/06/2017   Hyperuricemia 01/06/2017   Mixed hyperlipidemia 01/06/2017   Gastroesophageal reflux disease 01/06/2017   Obesity 12/26/2012    Allergies  Allergen Reactions   Ciprofloxacin Other (See Comments)    hallucination   Pollen Extract Other (See Comments)    Sneezing, watery eyes, etc.   Caffeine Palpitations    palpatations    Past Surgical History:  Procedure Laterality Date   A/V FISTULAGRAM Left 12/15/2022   Procedure: A/V Fistulagram;  Surgeon: Renford Dills, MD;  Location: [facility];  Service: Cardiovascular;  Laterality: Left;   BASCILIC VEIN TRANSPOSITION Left 02/23/2020   Procedure: BASCILIC VEIN TRANSPOSITION (SINGLE STAGE);  Surgeon: Renford Dills, MD;  Location: [facility] ORS;  Service: Vascular;  Laterality: Left;   COLONOSCOPY  2012   hemorrhoids   melanoma removal     forehead    Social History   Tobacco Use   Smoking status: Never   Smokeless tobacco: Never  Vaping Use   Vaping Use: Never used  Substance Use Topics   Alcohol use: No   Drug use: No     Medication list has been reviewed and updated.  Current Meds  Medication Sig   allopurinol (ZYLOPRIM) 100 MG tablet Take 1 tablet (100 mg total) by mouth daily.   blood glucose meter kit and supplies Dispense based on patient and insurance preference. Use up to four times daily as directed. (FOR ICD-10 E11.9).   cyanocobalamin 1000 MCG tablet Take 1,000 mcg by mouth daily.   Dulaglutide (TRULICITY) 0.75 MG/0.5ML SOPN INJECT 0.75 MG SUBCUTANEOUSLY ONE TIME PER WEEK   ELIQUIS 2.5 MG TABS tablet TAKE 1 TABLET BY MOUTH TWICE A DAY   furosemide (LASIX) 20 MG tablet Take 1 tablet (20 mg total) by mouth daily.   Lancets (ONETOUCH DELICA PLUS LANCET30G) MISC USE AS DIRECTED EVERY DAY   loratadine (CLARITIN) 10 MG tablet Take  10 mg by mouth at bedtime. otc   lovastatin (MEVACOR) 20 MG tablet Take 1 tablet (20 mg total) by mouth daily.   ONETOUCH ULTRA test strip ONE TEST DAILY   pantoprazole (PROTONIX) 40 MG tablet Take 1 tablet (40 mg total) by mouth daily.   spironolactone (ALDACTONE) 25 MG tablet Take 25 mg by mouth daily.       01/14/2023   10:42 AM 09/14/2022    9:48 AM 05/12/2022   10:06 AM 04/20/2022    9:21 AM  GAD 7 : Generalized Anxiety Score  Nervous, Anxious, on Edge 0 0 0 0  Control/stop worrying 0 0 1 0  Worry too much - different things 0 0 1 0  Trouble relaxing 0 0 0 0  Restless 0 0 0 0  Easily annoyed or irritable 0 0 0 0  Afraid - awful might happen 0 0 0 0  Total GAD 7 Score 0 0 2 0  Anxiety Difficulty Not difficult at all Not difficult at all Not difficult at all Not difficult at all       01/14/2023   10:42 AM  09/14/2022    9:48 AM 05/12/2022   10:05 AM  Depression screen PHQ 2/9  Decreased Interest 0 0 0  Down, Depressed, Hopeless 1 0 1  PHQ - 2 Score 1 0 1  Altered sleeping 0 0 0  Tired, decreased energy 0 0 0  Change in appetite 0 0 0  Feeling bad or failure about yourself  0 0 1  Trouble concentrating 0 0 0  Moving slowly or fidgety/restless 0 0 0  Suicidal thoughts 0 0 0  PHQ-9 Score 1 0 2  Difficult doing work/chores Not difficult at all Not difficult at all Not difficult at all    BP Readings from Last 3 Encounters:  01/14/23 120/62  01/05/23 110/70  12/15/22 122/67    Physical Exam Vitals and nursing note reviewed.  HENT:     Head: Normocephalic.     Right Ear: Tympanic membrane and external ear normal.     Left Ear: Tympanic membrane and external ear normal.     Nose: Nose normal. No congestion or rhinorrhea.     Mouth/Throat:     Mouth: Mucous membranes are moist.     Pharynx: No oropharyngeal exudate.  Eyes:     General: No scleral icterus.       Right eye: No discharge.        Left eye: No discharge.     Conjunctiva/sclera: Conjunctivae normal.      Pupils: Pupils are equal, round, and reactive to light.  Neck:     Thyroid: No thyromegaly.     Vascular: No JVD.     Trachea: No tracheal deviation.  Cardiovascular:     Rate and Rhythm: Normal rate and regular rhythm.     Heart sounds: Normal heart sounds. No murmur heard.    No friction rub. No gallop.  Pulmonary:     Effort: No respiratory distress.     Breath sounds: Normal breath sounds. No wheezing or rales.  Abdominal:     General: Bowel sounds are normal.     Palpations: Abdomen is soft. There is no mass.     Tenderness: There is no abdominal tenderness. There is no guarding or rebound.  Musculoskeletal:        General: No tenderness. Normal range of motion.     Cervical back: Normal range of motion and neck supple.  Lymphadenopathy:     Cervical: No cervical adenopathy.  Skin:    General: Skin is warm.     Findings: No rash.  Neurological:     Mental Status: He is alert.     Cranial Nerves: No cranial nerve deficit.     Motor: Weakness present.     Deep Tendon Reflexes: Reflexes are normal and symmetric.     Wt Readings from Last 3 Encounters:  01/14/23 163 lb (73.9 kg)  01/05/23 159 lb (72.1 kg)  12/15/22 159 lb 9.8 oz (72.4 kg)    BP 120/62   Pulse 88   Ht 6' (1.829 m)   Wt 163 lb (73.9 kg)   SpO2 96%   BMI 22.11 kg/m   Assessment and Plan: 1. Type 2 diabetes mellitus with diabetic nephropathy, without long-term current use of insulin (HCC) Chronic.  Controlled.  Stable.  Continue Trulicity 0.7 5 subcutaneous once a week.  Will check A1c for current status of control.  Recently patient's been on prednisone for symptomatic foot drop.  Pending A1c we will make adjustments on his Trulicity otherwise we will await nerve conduction  studies to determine if we proceed in a neurologic versus CKD etiology. - Dulaglutide (TRULICITY) 0.75 MG/0.5ML SOPN; INJECT 0.75 MG SUBCUTANEOUSLY ONE TIME PER WEEK  Dispense: 6 mL; Refill: 1 - HgB A1c  2. Gastroesophageal  reflux disease, unspecified whether esophagitis present Chronic.  Controlled.  Stable.  Currently stable on Protonix 40 mg daily.  Will recheck and 6 months. - pantoprazole (PROTONIX) 40 MG tablet; Take 1 tablet (40 mg total) by mouth daily.  Dispense: 90 tablet; Refill: 1     Elizabeth Sauer, MD

## 2023-01-15 DIAGNOSIS — Z992 Dependence on renal dialysis: Secondary | ICD-10-CM | POA: Diagnosis not present

## 2023-01-15 DIAGNOSIS — N2581 Secondary hyperparathyroidism of renal origin: Secondary | ICD-10-CM | POA: Diagnosis not present

## 2023-01-15 DIAGNOSIS — Z23 Encounter for immunization: Secondary | ICD-10-CM | POA: Diagnosis not present

## 2023-01-15 DIAGNOSIS — N186 End stage renal disease: Secondary | ICD-10-CM | POA: Diagnosis not present

## 2023-01-15 DIAGNOSIS — D631 Anemia in chronic kidney disease: Secondary | ICD-10-CM | POA: Diagnosis not present

## 2023-01-15 LAB — HEMOGLOBIN A1C
Est. average glucose Bld gHb Est-mCnc: 100 mg/dL
Hgb A1c MFr Bld: 5.1 % (ref 4.8–5.6)

## 2023-01-17 DIAGNOSIS — N186 End stage renal disease: Secondary | ICD-10-CM | POA: Diagnosis not present

## 2023-01-17 DIAGNOSIS — Q612 Polycystic kidney, adult type: Secondary | ICD-10-CM | POA: Diagnosis not present

## 2023-01-17 DIAGNOSIS — Z992 Dependence on renal dialysis: Secondary | ICD-10-CM | POA: Diagnosis not present

## 2023-01-18 DIAGNOSIS — D631 Anemia in chronic kidney disease: Secondary | ICD-10-CM | POA: Diagnosis not present

## 2023-01-18 DIAGNOSIS — N186 End stage renal disease: Secondary | ICD-10-CM | POA: Diagnosis not present

## 2023-01-18 DIAGNOSIS — N2581 Secondary hyperparathyroidism of renal origin: Secondary | ICD-10-CM | POA: Diagnosis not present

## 2023-01-18 DIAGNOSIS — D509 Iron deficiency anemia, unspecified: Secondary | ICD-10-CM | POA: Diagnosis not present

## 2023-01-18 DIAGNOSIS — Z992 Dependence on renal dialysis: Secondary | ICD-10-CM | POA: Diagnosis not present

## 2023-01-20 ENCOUNTER — Other Ambulatory Visit: Payer: Self-pay | Admitting: Family Medicine

## 2023-01-20 DIAGNOSIS — E782 Mixed hyperlipidemia: Secondary | ICD-10-CM

## 2023-01-20 DIAGNOSIS — D509 Iron deficiency anemia, unspecified: Secondary | ICD-10-CM | POA: Diagnosis not present

## 2023-01-20 DIAGNOSIS — Z992 Dependence on renal dialysis: Secondary | ICD-10-CM | POA: Diagnosis not present

## 2023-01-20 DIAGNOSIS — N186 End stage renal disease: Secondary | ICD-10-CM | POA: Diagnosis not present

## 2023-01-20 DIAGNOSIS — N2581 Secondary hyperparathyroidism of renal origin: Secondary | ICD-10-CM | POA: Diagnosis not present

## 2023-01-20 DIAGNOSIS — D631 Anemia in chronic kidney disease: Secondary | ICD-10-CM | POA: Diagnosis not present

## 2023-01-20 NOTE — Progress Notes (Addendum)
[facility] - Clinic Note  02/02/2023     CHIEF COMPLAINT Patient presents for Retina Follow Up    HISTORY OF PRESENT ILLNESS: Mark Bauer is a 67 y.o. male who presents to the clinic today for:   HPI     Retina Follow Up   Patient presents with  Other.  In right eye.  Duration of 1 year.  I, the attending physician,  performed the HPI with the patient and updated documentation appropriately.        Comments   Patient feels that the vision is the same. He is not using eye drops. His blood sugar was 86. His A1C is 5.1 about 3 weeks ago.       Last edited by Rennis Chris, MD on 02/04/2023  8:31 PM.    Patient states that he is not having any vision issues at this time. He feels that the eyes are doing well.    Referring physician: Thereasa Solo OD [address] [place], [place] [address]  HISTORICAL INFORMATION:   Selected notes from the MEDICAL RECORD NUMBER Referred by Dr. Thereasa Solo for diabetic retinal eval LEE: 10/21/2020 BCVA OD: 20/25-2 OS: 20/25-2 Ocular Hx- ?DR PMH- DM, HTN, gout, last a1c was 5.7 in February 2022    CURRENT MEDICATIONS: No current outpatient medications on file. (Ophthalmic Drugs)   No current facility-administered medications for this visit. (Ophthalmic Drugs)   Current Outpatient Medications (Other)  Medication Sig   allopurinol (ZYLOPRIM) 100 MG tablet Take 1 tablet (100 mg total) by mouth daily.   amoxicillin-clavulanate (AUGMENTIN) 500-125 MG tablet Take 500 mg by mouth daily. (Patient not taking: Reported on 01/28/2023)   blood glucose meter kit and supplies Dispense based on patient and insurance preference. Use up to four times daily as directed. (FOR ICD-10 E11.9).   cyanocobalamin 1000 MCG tablet Take 1,000 mcg by mouth daily.   Dulaglutide (TRULICITY) 0.75 MG/0.5ML SOPN INJECT 0.75 MG SUBCUTANEOUSLY ONE TIME PER WEEK   ELIQUIS 2.5 MG TABS tablet TAKE 1 TABLET BY MOUTH TWICE A DAY    furosemide (LASIX) 20 MG tablet Take 1 tablet (20 mg total) by mouth daily.   Lancets (ONETOUCH DELICA PLUS LANCET30G) MISC USE AS DIRECTED EVERY DAY   loratadine (CLARITIN) 10 MG tablet Take 10 mg by mouth at bedtime. otc   lovastatin (MEVACOR) 20 MG tablet Take 1 tablet (20 mg total) by mouth daily.   ONETOUCH ULTRA test strip ONE TEST DAILY   pantoprazole (PROTONIX) 40 MG tablet Take 1 tablet (40 mg total) by mouth daily.   spironolactone (ALDACTONE) 25 MG tablet Take 25 mg by mouth daily.   No current facility-administered medications for this visit. (Other)   REVIEW OF SYSTEMS: ROS   Positive for: Gastrointestinal, Genitourinary, Endocrine, Cardiovascular, Eyes Negative for: Constitutional, Neurological, Skin, Musculoskeletal, HENT, Respiratory, Psychiatric, Allergic/Imm, Heme/Lymph Last edited by Charlette Caffey, COT on 02/02/2023  9:35 AM.      ALLERGIES Allergies  Allergen Reactions   Ciprofloxacin Other (See Comments)    hallucination  Other Reaction(s): Unknown   Pollen Extract Other (See Comments)    Sneezing, watery eyes, etc.  Other Reaction(s): Unknown   Caffeine Palpitations    palpatations   PAST MEDICAL HISTORY Past Medical History:  Diagnosis Date   Allergy    Bilateral undescended testicles    Cancer (HCC)    melanoma on forehead   Cataract    OU   Diabetes mellitus without complication (HCC)  Dysrhythmia    a fib   GERD (gastroesophageal reflux disease)    Gout    Hepatitis    polycystic    Hypertension    Hypokalemia    Mixed hyperlipidemia    Polycystic kidney    Polycystic liver disease    Vitamin D deficiency    Past Surgical History:  Procedure Laterality Date   A/V FISTULAGRAM Left 12/15/2022   Procedure: A/V Fistulagram;  Surgeon: Renford Dills, MD;  Location: [facility];  Service: Cardiovascular;  Laterality: Left;   BASCILIC VEIN TRANSPOSITION Left 02/23/2020   Procedure: BASCILIC VEIN TRANSPOSITION (SINGLE  STAGE);  Surgeon: Renford Dills, MD;  Location: [facility] ORS;  Service: Vascular;  Laterality: Left;   COLONOSCOPY  2012   hemorrhoids   melanoma removal     forehead   FAMILY HISTORY Family History  Problem Relation Age of Onset   Diabetes Mother    Heart disease Father    SOCIAL HISTORY Social History   Tobacco Use   Smoking status: Never   Smokeless tobacco: Never  Vaping Use   Vaping status: Never Used  Substance Use Topics   Alcohol use: No   Drug use: No       OPHTHALMIC EXAM:  Base Eye Exam     Visual Acuity (Snellen - Linear)       Right Left   Dist cc 20/20 +2 20/20 -1    Correction: Glasses         Tonometry (Tonopen, 9:37 AM)       Right Left   Pressure 9 8         Pupils       Dark Light Shape React APD   Right 3 2 Round Brisk None   Left 3 2 Round Brisk None         Visual Fields       Left Right    Full Full         Extraocular Movement       Right Left    Full, Ortho Full, Ortho         Neuro/Psych     Oriented x3: Yes   Mood/Affect: Normal         Dilation     Both eyes: 1.0% Mydriacyl, 2.5% Phenylephrine @ 9:35 AM           Slit Lamp and Fundus Exam     Slit Lamp Exam       Right Left   Lids/Lashes Dermatochalasis - upper lid, Meibomian gland dysfunction Dermatochalasis - upper lid, Meibomian gland dysfunction   Conjunctiva/Sclera White and quiet White and quiet   Cornea mild tear film debris mild tear film debris, trace PEE   Anterior Chamber Deep and clear Deep and clear   Iris Round and dilated Round and dilated   Lens 2+ Nuclear sclerosis, 2-3+ Cortical cataract 2+ Nuclear sclerosis, 2-3+ Cortical cataract   Anterior Vitreous Mild vitreous syneresis, Posterior vitreous detachment Mil vitreous syneresis         Fundus Exam       Right Left   Disc mild Pallor, Sharp rim, Compact mild Pallor, Sharp rim   C/D Ratio 0.2 0.4   Macula Flat, Blunted foveal reflex, RPE mottling and clumping  Flat, Blunted foveal reflex, mild RPE mottling and clumping, No heme or edema, fine drusen   Vessels attenuated, tortuous, AV crossing changes attenuated, tortuous, AV crossing changes   Periphery Attached, blonde fundus, prominent  vortex veins, pigmented cystoid degeneration inferiorly, trace reticular degeneration Attached, shallow peripheral schisis-- stable, pigmented cystoid degeneration inferiorly, No RT/RD, mild reticular degeneration           Refraction     Wearing Rx       Sphere Cylinder Axis Add   Right -5.50 +1.25 064 +2.25   Left -3.50 +2.00 108 +2.25    Type: PAL           IMAGING AND PROCEDURES  Imaging and Procedures for 02/02/2023  OCT, Retina - OU - Both Eyes       Right Eye Quality was good. Central Foveal Thickness: 232. Progression has been stable. Findings include normal foveal contour, no IRF, no SRF (Foveal notch, focal ellipsoid irregularity and inner retinal hyper reflectivity along IT arcades -- stable).   Left Eye Quality was good. Central Foveal Thickness: 246. Progression has been stable. Findings include normal foveal contour, no IRF, no SRF, vitreomacular adhesion (Mild multilaminar schisis 360 periphery, sparing macula-- stable).   Notes *Images captured and stored on drive  Diagnosis / Impression:  NFP; no IRF/SRF OU OD: Foveal notch; focal ellipsoid irregularity and inner retinal hyper reflectivity along IT arcades --stable OS: Mild multilaminar schisis 360 periphery, sparing macula -- stable  Clinical management:  See below  Abbreviations: NFP - Normal foveal profile. CME - cystoid macular edema. PED - pigment epithelial detachment. IRF - intraretinal fluid. SRF - subretinal fluid. EZ - ellipsoid zone. ERM - epiretinal membrane. ORA - outer retinal atrophy. ORT - outer retinal tubulation. SRHM - subretinal hyper-reflective material. IRHM - intraretinal hyper-reflective material            ASSESSMENT/PLAN:    ICD-10-CM   1.  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 Subretinal hemorrhage of right eye  H35.61     3. Diabetes mellitus type 2 without retinopathy (HCC)  E11.9     4. Long-term current use of injectable noninsulin antidiabetic medication  Z79.85     5. Essential hypertension  I10     6. Hypertensive retinopathy of both eyes  H35.033     7. Combined forms of age-related cataract of both eyes  H25.813      1. Retinoschisis OS -- stable - peripheral multilaminar schisis, sparing macula, stable - identified on widefield OCT 07.11.23  - OCT shows shallow, multilaminar schisis in periphery only -- stable from prior  - no RT/RD on exam  - pt asymptomatic  - s/s of RD reviewed  - f/u 1 year, DFE, OCT  2. H/o Subretinal hemorrhage OD -- stably resolved  - incidental finding on routine eye exam by Dr. Senaida Ores  - pt asymptomatic  - focal subretinal heme along proximal IT arteriole OD - OCT showed mild inner retinal hyperreflectivity and focal ellipsoid irregularity at focal heme site -- improved  - FA 4.13.22 without leakage, but +blockage at heme site -- not active - pt with history of Afib, was on coumadin, now on eliquis -- history of difficulty controlling INR and easy bruising  - suspect focal subretinal heme is related to history of coumadin use and HTN  - not visually significant and not threatening vision  - discussed findings, prognosis  - no intervention recommended at this time  - monitor  - f/u 1 year, sooner prn -- DFE/OCT  3,4. Diabetes mellitus, type 2 without retinopathy - The incidence, risk factors for progression, natural history and treatment options for diabetic  retinopathy  were discussed with patient.   - The need for close monitoring of blood glucose, blood pressure, and serum lipids, avoiding cigarette or any type of tobacco, and the need for long term follow up was also discussed with patient. - f/u in 1 year, sooner prn  5,6. Hypertensive  retinopathy OU - discussed importance of tight BP control - monitor  7. Mixed Cataract OU - The symptoms of cataract, surgical options, and treatments and risks were discussed with patient. - discussed diagnosis and progression - monitor  Ophthalmic Meds Ordered this visit:  No orders of the defined types were placed in this encounter.    Return in about 1 year (around 02/02/2024) for f/u Schisis, DFE, OCT.  There are no Patient Instructions on file for this visit.   Explained the diagnoses, plan, and follow up with the patient and they expressed understanding.  Patient expressed understanding of the importance of proper follow up care.  This document serves as a record of services personally performed by Karie Chimera, MD, PhD. It was created on their behalf by Gerilyn Nestle, COT an ophthalmic technician. The creation of this record is the provider's dictation and/or activities during the visit.    Electronically signed by:  Charlette Caffey, COT  02/04/23 8:35 PM  Karie Chimera, M.D., Ph.D. Diseases & Surgery of the Retina and Vitreous [facility]  I have reviewed the above documentation for accuracy and completeness, and I agree with the above. Karie Chimera, M.D., Ph.D. 02/04/23 8:35 PM  Abbreviations: M myopia (nearsighted); A astigmatism; H hyperopia (farsighted); P presbyopia; Mrx spectacle prescription;  CTL contact lenses; OD right eye; OS left eye; OU both eyes  XT exotropia; ET esotropia; PEK punctate epithelial keratitis; PEE punctate epithelial erosions; DES dry eye syndrome; MGD meibomian gland dysfunction; ATs artificial tears; PFAT's preservative free artificial tears; NSC nuclear sclerotic cataract; PSC posterior subcapsular cataract; ERM epi-retinal membrane; PVD posterior vitreous detachment; RD retinal detachment; DM diabetes mellitus; DR diabetic retinopathy; NPDR non-proliferative diabetic retinopathy; PDR proliferative diabetic  retinopathy; CSME clinically significant macular edema; DME diabetic macular edema; dbh dot blot hemorrhages; CWS cotton wool spot; POAG primary open angle glaucoma; C/D cup-to-disc ratio; HVF humphrey visual field; GVF goldmann visual field; OCT optical coherence tomography; IOP intraocular pressure; BRVO Branch retinal vein occlusion; CRVO central retinal vein occlusion; CRAO central retinal artery occlusion; BRAO branch retinal artery occlusion; RT retinal tear; SB scleral buckle; PPV pars plana vitrectomy; VH Vitreous hemorrhage; PRP panretinal laser photocoagulation; IVK intravitreal kenalog; VMT vitreomacular traction; MH Macular hole;  NVD neovascularization of the disc; NVE neovascularization elsewhere; AREDS age related eye disease study; ARMD age related macular degeneration; POAG primary open angle glaucoma; EBMD epithelial/anterior basement membrane dystrophy; ACIOL anterior chamber intraocular lens; IOL intraocular lens; PCIOL posterior chamber intraocular lens; Phaco/IOL phacoemulsification with intraocular lens placement; PRK photorefractive keratectomy; LASIK laser assisted in situ keratomileusis; HTN hypertension; DM diabetes mellitus; COPD chronic obstructive pulmonary disease

## 2023-01-20 NOTE — Telephone Encounter (Signed)
Rx was sent to pharmacy on 09/14/22 #90/1.   Requested Prescriptions  Refused Prescriptions Disp Refills   lovastatin (MEVACOR) 20 MG tablet [Pharmacy Med Name: LOVASTATIN 20 MG TABLET] 90 tablet 1    Sig: TAKE 1 TABLET BY MOUTH EVERY DAY     Cardiovascular:  Antilipid - Statins 2 Failed - 01/20/2023  4:04 PM      Failed - Cr in normal range and within 360 days    Creatinine, Ser  Date Value Ref Range Status  09/14/2022 9.53 (H) 0.76 - 1.27 mg/dL Final         Failed - Lipid Panel in normal range within the last 12 months    Cholesterol, Total  Date Value Ref Range Status  09/14/2022 83 (L) 100 - 199 mg/dL Final   LDL Chol Calc (NIH)  Date Value Ref Range Status  09/14/2022 37 0 - 99 mg/dL Final   HDL  Date Value Ref Range Status  09/14/2022 32 (L) >39 mg/dL Final   Triglycerides  Date Value Ref Range Status  09/14/2022 62 0 - 149 mg/dL Final         Passed - Patient is not pregnant      Passed - Valid encounter within last 12 months    Recent Outpatient Visits           6 days ago Type 2 diabetes mellitus with diabetic nephropathy, without long-term current use of insulin (HCC)   [place] [facility], [facility]   2 weeks ago Right foot drop   [place] [facility], Ocie Bob, MD   4 months ago Weight gain with edema   [facility], MD   8 months ago Type 2 diabetes mellitus with diabetic nephropathy, without long-term current use of insulin (HCC)   [place] [facility], MD   9 months ago Venous insufficiency   [place] [facility], MD

## 2023-01-22 DIAGNOSIS — D631 Anemia in chronic kidney disease: Secondary | ICD-10-CM | POA: Diagnosis not present

## 2023-01-22 DIAGNOSIS — D509 Iron deficiency anemia, unspecified: Secondary | ICD-10-CM | POA: Diagnosis not present

## 2023-01-22 DIAGNOSIS — N186 End stage renal disease: Secondary | ICD-10-CM | POA: Diagnosis not present

## 2023-01-22 DIAGNOSIS — N2581 Secondary hyperparathyroidism of renal origin: Secondary | ICD-10-CM | POA: Diagnosis not present

## 2023-01-22 DIAGNOSIS — Z992 Dependence on renal dialysis: Secondary | ICD-10-CM | POA: Diagnosis not present

## 2023-01-25 DIAGNOSIS — N186 End stage renal disease: Secondary | ICD-10-CM | POA: Diagnosis not present

## 2023-01-25 DIAGNOSIS — N2581 Secondary hyperparathyroidism of renal origin: Secondary | ICD-10-CM | POA: Diagnosis not present

## 2023-01-25 DIAGNOSIS — Z992 Dependence on renal dialysis: Secondary | ICD-10-CM | POA: Diagnosis not present

## 2023-01-25 DIAGNOSIS — D509 Iron deficiency anemia, unspecified: Secondary | ICD-10-CM | POA: Diagnosis not present

## 2023-01-25 DIAGNOSIS — D631 Anemia in chronic kidney disease: Secondary | ICD-10-CM | POA: Diagnosis not present

## 2023-01-27 DIAGNOSIS — D631 Anemia in chronic kidney disease: Secondary | ICD-10-CM | POA: Diagnosis not present

## 2023-01-27 DIAGNOSIS — Z992 Dependence on renal dialysis: Secondary | ICD-10-CM | POA: Diagnosis not present

## 2023-01-27 DIAGNOSIS — N186 End stage renal disease: Secondary | ICD-10-CM | POA: Diagnosis not present

## 2023-01-27 DIAGNOSIS — D509 Iron deficiency anemia, unspecified: Secondary | ICD-10-CM | POA: Diagnosis not present

## 2023-01-27 DIAGNOSIS — N2581 Secondary hyperparathyroidism of renal origin: Secondary | ICD-10-CM | POA: Diagnosis not present

## 2023-01-28 ENCOUNTER — Ambulatory Visit (INDEPENDENT_AMBULATORY_CARE_PROVIDER_SITE_OTHER): Payer: Medicare Other

## 2023-01-28 VITALS — Ht 72.0 in | Wt 162.0 lb

## 2023-01-28 DIAGNOSIS — Z0000 Encounter for general adult medical examination without abnormal findings: Secondary | ICD-10-CM | POA: Diagnosis not present

## 2023-01-28 NOTE — Progress Notes (Signed)
Subjective:   Mark Bauer is a 67 y.o. male who presents for Medicare Annual/Subsequent preventive examination.  Visit Complete: Virtual  I connected with  Velora Mediate on 01/28/23 by a audio enabled telemedicine application and verified that I am speaking with the correct person using two identifiers.  Patient Location: Home  Provider Location: Office/Clinic  I discussed the limitations of evaluation and management by telemedicine. The patient expressed understanding and agreed to proceed.  Review of Systems     Cardiac Risk Factors include: advanced age (>81men, >67 women);diabetes mellitus;dyslipidemia;male gender;hypertension;sedentary lifestyle     Objective:    Today's Vitals   01/28/23 1042  Weight: 162 lb (73.5 kg)  Height: 6' (1.829 m)   Body mass index is 21.97 kg/m.     01/28/2023   10:33 AM 12/15/2022   12:57 PM 01/14/2022    2:10 PM 01/13/2021    2:06 PM 02/16/2020    1:57 PM 01/10/2020    2:09 PM 07/18/2015    9:40 AM  Advanced Directives  Does Patient Have a Medical Advance Directive? No No No No No No No  Would patient like information on creating a medical advance directive? No - Patient declined No - Patient declined Yes (MAU/Ambulatory/Procedural Areas - Information given) No - Patient declined No - Patient declined Yes (MAU/Ambulatory/Procedural Areas - Information given)     Current Medications (verified) Outpatient Encounter Medications as of 01/28/2023  Medication Sig   allopurinol (ZYLOPRIM) 100 MG tablet Take 1 tablet (100 mg total) by mouth daily.   blood glucose meter kit and supplies Dispense based on patient and insurance preference. Use up to four times daily as directed. (FOR ICD-10 E11.9).   cyanocobalamin 1000 MCG tablet Take 1,000 mcg by mouth daily.   Dulaglutide (TRULICITY) 0.75 MG/0.5ML SOPN INJECT 0.75 MG SUBCUTANEOUSLY ONE TIME PER WEEK   ELIQUIS 2.5 MG TABS tablet TAKE 1 TABLET BY MOUTH TWICE A DAY   furosemide (LASIX) 20 MG  tablet Take 1 tablet (20 mg total) by mouth daily.   Lancets (ONETOUCH DELICA PLUS LANCET30G) MISC USE AS DIRECTED EVERY DAY   loratadine (CLARITIN) 10 MG tablet Take 10 mg by mouth at bedtime. otc   lovastatin (MEVACOR) 20 MG tablet Take 1 tablet (20 mg total) by mouth daily.   ONETOUCH ULTRA test strip ONE TEST DAILY   pantoprazole (PROTONIX) 40 MG tablet Take 1 tablet (40 mg total) by mouth daily.   spironolactone (ALDACTONE) 25 MG tablet Take 25 mg by mouth daily.   amoxicillin-clavulanate (AUGMENTIN) 500-125 MG tablet Take 500 mg by mouth daily. (Patient not taking: Reported on 01/28/2023)   No facility-administered encounter medications on file as of 01/28/2023.    Allergies (verified) Ciprofloxacin, Pollen extract, and Caffeine   History: Past Medical History:  Diagnosis Date   Allergy    Bilateral undescended testicles    Cancer (HCC)    melanoma on forehead   Cataract    OU   Diabetes mellitus without complication (HCC)    Dysrhythmia    a fib   GERD (gastroesophageal reflux disease)    Gout    Hepatitis    polycystic    Hypertension    Hypokalemia    Mixed hyperlipidemia    Polycystic kidney    Polycystic liver disease    Vitamin D deficiency    Past Surgical History:  Procedure Laterality Date   A/V FISTULAGRAM Left 12/15/2022   Procedure: A/V Fistulagram;  Surgeon: Renford Dills, MD;  Location: [facility] INVASIVE CV LAB;  Service: Cardiovascular;  Laterality: Left;   BASCILIC VEIN TRANSPOSITION Left 02/23/2020   Procedure: BASCILIC VEIN TRANSPOSITION (SINGLE STAGE);  Surgeon: Renford Dills, MD;  Location: [facility] ORS;  Service: Vascular;  Laterality: Left;   COLONOSCOPY  2012   hemorrhoids   melanoma removal     forehead   Family History  Problem Relation Age of Onset   Diabetes Mother    Heart disease Father    Social History   Socioeconomic History   Marital status: Single    Spouse name: Not on file   Number of children: 0   Years of education:  Not on file   Highest education level: Associate degree: occupational, Scientist, product/process development, or vocational program  Occupational History   Occupation: retired  Tobacco Use   Smoking status: Never   Smokeless tobacco: Never  Vaping Use   Vaping status: Never Used  Substance and Sexual Activity   Alcohol use: No   Drug use: No   Sexual activity: Never  Other Topics Concern   Not on file  Social History Narrative   Never married, no children, lives alone.   Social Determinants of Health   Financial Resource Strain: Low Risk  (01/28/2023)   Overall Financial Resource Strain (CARDIA)    Difficulty of Paying Living Expenses: Not very hard  Food Insecurity: No Food Insecurity (01/28/2023)   Hunger Vital Sign    Worried About Running Out of Food in the Last Year: Never true    Ran Out of Food in the Last Year: Never true  Transportation Needs: No Transportation Needs (01/28/2023)   PRAPARE - Administrator, Civil Service (Medical): No    Lack of Transportation (Non-Medical): No  Physical Activity: Insufficiently Active (01/28/2023)   Exercise Vital Sign    Days of Exercise per Week: 2 days    Minutes of Exercise per Session: 30 min  Stress: No Stress Concern Present (01/28/2023)   [employer] of Occupational Health - Occupational Stress Questionnaire    Feeling of Stress : Only a little  Social Connections: Socially Isolated (01/28/2023)   Social Connection and Isolation Panel [NHANES]    Frequency of Communication with Friends and Family: More than three times a week    Frequency of Social Gatherings with Friends and Family: Patient declined    Attends Religious Services: Never    Database administrator or Organizations: No    Attends Engineer, structural: Never    Marital Status: Never married    Tobacco Counseling Counseling given: Not Answered   Clinical Intake:  Pre-visit preparation completed: Yes  Pain : No/denies pain     Nutritional Risks:  None Diabetes: Yes CBG done?: No Did pt. bring in CBG monitor from home?: No  How often do you need to have someone help you when you read instructions, pamphlets, or other written materials from your doctor or pharmacy?: 1 - Never  Interpreter Needed?: No  Information entered by :: Kennedy Bucker, LPN   Activities of Daily Living    01/28/2023   10:33 AM 01/24/2023   12:47 PM  In your present state of health, do you have any difficulty performing the following activities:  Hearing? 0 0  Vision? 0 0  Difficulty concentrating or making decisions? 0 0  Walking or climbing stairs? 0 0  Dressing or bathing? 0 0  Doing errands, shopping? 0 0  Preparing Food and eating ? N N  Using the  Toilet? N N  In the past six months, have you accidently leaked urine? N N  Do you have problems with loss of bowel control? N N  Managing your Medications? N N  Managing your Finances? N N  Housekeeping or managing your Housekeeping? N N    Patient Care Team: Duanne Limerick, MD as PCP - General (Family Medicine) Gilda Crease, Latina Craver, MD (Vascular Surgery)  Indicate any recent Medical Services you may have received from other than [facility] providers in the past year (date may be approximate).     Assessment:   This is a routine wellness examination for [place].  Hearing/Vision screen Hearing Screening - Comments:: No aids Vision Screening - Comments:: Wears glasses- Dr.Richardson   Dietary issues and exercise activities discussed:     Goals Addressed             This Visit's Progress    DIET - EAT MORE FRUITS AND VEGETABLES         Depression Screen    01/28/2023   10:29 AM 01/14/2023   10:42 AM 09/14/2022    9:48 AM 05/12/2022   10:05 AM 04/20/2022    9:20 AM 01/14/2022    2:09 PM 01/09/2022   10:18 AM  PHQ 2/9 Scores  PHQ - 2 Score 1 1 0 1 0 0 2  PHQ- 9 Score 1 1 0 2 0 0 3    Fall Risk    01/28/2023   10:33 AM 01/24/2023   12:47 PM 01/14/2023   10:42 AM 09/14/2022    9:47 AM  05/12/2022   10:05 AM  Fall Risk   Falls in the past year? 0 0 0 0 0  Number falls in past yr: 0 0 0 0 0  Injury with Fall? 0 0 0 0 0  Risk for fall due to : No Fall Risks  No Fall Risks Impaired balance/gait No Fall Risks  Follow up Falls prevention discussed;Falls evaluation completed  Falls evaluation completed Falls evaluation completed;Falls prevention discussed Falls evaluation completed    MEDICARE RISK AT HOME:  Medicare Risk at Home - 01/28/23 1034     Any stairs in or around the home? Yes    If so, are there any without handrails? No    Home free of loose throw rugs in walkways, pet beds, electrical cords, etc? Yes    Adequate lighting in your home to reduce risk of falls? Yes    Life alert? No    Use of a cane, walker or w/c? No    Grab bars in the bathroom? Yes    Shower chair or bench in shower? No    Elevated toilet seat or a handicapped toilet? Yes             TIMED UP AND GO:  Was the test performed?  No    Cognitive Function:        01/28/2023   10:35 AM  6CIT Screen  What Year? 0 points  What month? 0 points  What time? 0 points  Count back from 20 0 points  Months in reverse 0 points  Repeat phrase 0 points  Total Score 0 points    Immunizations Immunization History  Administered Date(s) Administered   Hepb-cpg 10/23/2022, 11/30/2022, 12/21/2022   Influenza, High Dose Seasonal PF 05/04/2019, 04/17/2021, 04/20/2022   Influenza,inj,Quad PF,6+ Mos 04/23/2017, 05/02/2018   Influenza-Unspecified 05/04/2019, 04/29/2020, 04/20/2022   PFIZER(Purple Top)SARS-COV-2 Vaccination 09/15/2019, 10/11/2019, 07/04/2020, 04/20/2022   PNEUMOCOCCAL CONJUGATE-20 04/20/2022,  12/02/2022   Pfizer Covid-19 Firefighter Booster 31yrs & up 04/17/2021   Pneumococcal Conjugate-13 06/09/2019   Pneumococcal Polysaccharide-23 12/27/2015   Tdap 07/26/2017   Unspecified SARS-COV-2 Vaccination 04/20/2022    TDAP status: Up to date  Flu Vaccine status: Up to  date  Pneumococcal vaccine status: Up to date  Covid-19 vaccine status: Completed vaccines  Qualifies for Shingles Vaccine? Yes   Zostavax completed No   Shingrix Completed?: No.    Education has been provided regarding the importance of this vaccine. Patient has been advised to call insurance company to determine out of pocket expense if they have not yet received this vaccine. Advised may also receive vaccine at local pharmacy or Health Dept. Verbalized acceptance and understanding.  Screening Tests Health Maintenance  Topic Date Due   COVID-19 Vaccine (6 - 2023-24 season) 01/30/2023 (Originally 06/15/2022)   Zoster Vaccines- Shingrix (1 of 2) 04/16/2023 (Originally 12/18/2003)   INFLUENZA VACCINE  02/18/2023   HEMOGLOBIN A1C  07/16/2023   OPHTHALMOLOGY EXAM  12/01/2023   FOOT EXAM  01/14/2024   Medicare Annual Wellness (AWV)  01/28/2024   DTaP/Tdap/Td (2 - Td or Tdap) 07/27/2027   Pneumonia Vaccine 60+ Years old  Completed   Hepatitis C Screening  Completed   HPV VACCINES  Aged Out   Colonoscopy  Discontinued    Health Maintenance  There are no preventive care reminders to display for this patient.   Colorectal cancer screening: Type of screening: Cologuard. Completed AGO PER PATIENT. Repeat every 3 years  Lung Cancer Screening: (Low Dose CT Chest recommended if Age 59-80 years, 20 pack-year currently smoking OR have quit w/in 15years.) does not qualify.   Additional Screening:  Hepatitis C Screening: does qualify; Completed 07/26/17  Vision Screening: Recommended annual ophthalmology exams for early detection of glaucoma and other disorders of the eye. Is the patient up to date with their annual eye exam?  Yes  Who is the provider or what is the name of the office in which the patient attends annual eye exams? Dr.Richardson If pt is not established with a provider, would they like to be referred to a provider to establish care? No .   Dental Screening:  Recommended annual dental exams for proper oral hygiene  Diabetic Foot Exam: Diabetic Foot Exam: Completed 01/14/23  Community Resource Referral / Chronic Care Management: CRR required this visit?  No   CCM required this visit?  No     Plan:     I have personally reviewed and noted the following in the patient's chart:   Medical and social history Use of alcohol, tobacco or illicit drugs  Current medications and supplements including opioid prescriptions. Patient is not currently taking opioid prescriptions. Functional ability and status Nutritional status Physical activity Advanced directives List of other physicians Hospitalizations, surgeries, and ER visits in previous 12 months Vitals Screenings to include cognitive, depression, and falls Referrals and appointments  In addition, I have reviewed and discussed with patient certain preventive protocols, quality metrics, and best practice recommendations. A written personalized care plan for preventive services as well as general preventive health recommendations were provided to patient.     Hal Hope, LPN   10/26/8117   After Visit Summary: (MyChart) Due to this being a telephonic visit, the after visit summary with patients personalized plan was offered to patient via MyChart   Nurse Notes: none

## 2023-01-28 NOTE — Patient Instructions (Signed)
Mark Bauer , Thank you for taking time to come for your Medicare Wellness Visit. I appreciate your ongoing commitment to your health goals. Please review the following plan we discussed and let me know if I can assist you in the future.   These are the goals we discussed:  Goals      DIET - EAT MORE FRUITS AND VEGETABLES     Monitor and Manage My Blood Sugar-Diabetes Type 2     Timeframe:  Long-Range Goal Priority:  High Start Date:                             Expected End Date:                       Follow Up Date 2 month follow up    - check blood sugar at prescribed times - check blood sugar if I feel it is too high or too low - take the blood sugar meter to all doctor visits    Why is this important?   Checking your blood sugar at home helps to keep it from getting very high or very low.  Writing the results in a diary or log helps the doctor know how to care for you.  Your blood sugar log should have the time, date and the results.  Also, write down the amount of insulin or other medicine that you take.  Other information, like what you ate, exercise done and how you were feeling, will also be helpful.     Notes:      Pharmacy care plan     CARE PLAN ENTRY (see longitudinal plan of care for additional care plan information)  Current Barriers:  Chronic Disese Management support, education, and care coordination needs related to Hypertension, Hyperlipidemia, Diabetes, Atrial Fibrillation, GERD, and Gout   Hypertension/A fib BP Readings from Last 3 Encounters:  09/04/20 120/80  06/24/20 130/62  06/06/20 103/66   Pharmacist Clinical Goal(s): Over the next 90 days, patient will work with PharmD and providers to achieve BP goal <130/80 Current regimen:  Warfarin 4 mg qd (adjusted based on INR) Spironolactone 25 mg Metoprolol succinate Amlodipine 10 mg qd Torsemide 10 mg qd prn swelling ( not needing currently) Interventions: Provided diet and exercise  counseling. Reviewed signs/symptoms of bleeding Patient self care activities - Over the next 90 days, patient will: Check BP 3-4 times weekly, document, and provide at future appointments Ensure daily salt intake < 2300 mg/day  Hyperlipidemia Lab Results  Component Value Date/Time   LDLCALC 55 05/02/2020 08:49 AM   Pharmacist Clinical Goal(s): Over the next 90 days, patient will work with PharmD and providers to maintain LDL goal < 70 Current regimen:  Lovastatin 20 mg qd Interventions: Provided diet and exercise counseling. Patient self care activities - Over the next 90 days, patient will: Continue heart healthy, low-sodium diet. Attend all scheduled appointments  Diabetes Lab Results  Component Value Date/Time   HGBA1C 5.7 (H) 09/04/2020 09:28 AM   HGBA1C 6.1 (H) 05/02/2020 08:49 AM   Pharmacist Clinical Goal(s): Over the next 90 days, patient will work with PharmD and providers to maintain A1c goal <7% Current regimen:  Victoza 1.8mg  daily  Interventions: Collaborate with prescriber to send in RX for new meter and supplies Will initiate PAP for Victoza to help alleviate financial burden. Patient self care activities - Over the next 90 days, patient will: Check blood  sugar once daily, document, and provide at future appointments Contact provider with any episodes of hypoglycemia  Medication management Pharmacist Clinical Goal(s): Over the next 90 days, patient will work with PharmD and providers to maintain optimal medication adherence Current pharmacy: [facility] Comprehensive medication review performed. Continue current medication management strategy Patient self care activities - Over the next 90 days, patient will: Focus on medication adherence by fill dates Take medications as prescribed Report any questions or concerns to PharmD and/or provider(s)  Please see past updates related to this goal by clicking on the "Past Updates" button in the  selected goal       Track and Manage My Blood Pressure-Hypertension     Timeframe:  Long-Range Goal Priority:  High Start Date:                             Expected End Date:                       Follow Up Date 2 month follow up   - check blood pressure 3 times per week - write blood pressure results in a log or diary    Why is this important?   You won't feel high blood pressure, but it can still hurt your blood vessels.  High blood pressure can cause heart or kidney problems. It can also cause a stroke.  Making lifestyle changes like losing a little weight or eating less salt will help.  Checking your blood pressure at home and at different times of the day can help to control blood pressure.  If the doctor prescribes medicine remember to take it the way the doctor ordered.  Call the office if you cannot afford the medicine or if there are questions about it.     Notes:         This is a list of the screening recommended for you and due dates:  Health Maintenance  Topic Date Due   COVID-19 Vaccine (6 - 2023-24 season) 01/30/2023*   Zoster (Shingles) Vaccine (1 of 2) 04/16/2023*   Flu Shot  02/18/2023   Hemoglobin A1C  07/16/2023   Eye exam for diabetics  12/01/2023   Complete foot exam   01/14/2024   Medicare Annual Wellness Visit  01/28/2024   DTaP/Tdap/Td vaccine (2 - Td or Tdap) 07/27/2027   Pneumonia Vaccine  Completed   Hepatitis C Screening  Completed   HPV Vaccine  Aged Out   Colon Cancer Screening  Discontinued  *Topic was postponed. The date shown is not the original due date.    Advanced directives: no  Conditions/risks identified: none  Next appointment: Follow up in one year for your annual wellness visit. 02/02/24 @ 2:00 pm by phone  Preventive Care 65 Years and Older, Male  Preventive care refers to lifestyle choices and visits with your health care provider that can promote health and wellness. What does preventive care include? A yearly  physical exam. This is also called an annual well check. Dental exams once or twice a year. Routine eye exams. Ask your health care provider how often you should have your eyes checked. Personal lifestyle choices, including: Daily care of your teeth and gums. Regular physical activity. Eating a healthy diet. Avoiding tobacco and drug use. Limiting alcohol use. Practicing safe sex. Taking low doses of aspirin every day. Taking vitamin and mineral supplements as recommended by your health care provider.  What happens during an annual well check? The services and screenings done by your health care provider during your annual well check will depend on your age, overall health, lifestyle risk factors, and family history of disease. Counseling  Your health care provider may ask you questions about your: Alcohol use. Tobacco use. Drug use. Emotional well-being. Home and relationship well-being. Sexual activity. Eating habits. History of falls. Memory and ability to understand (cognition). Work and work Astronomer. Screening  You may have the following tests or measurements: Height, weight, and BMI. Blood pressure. Lipid and cholesterol levels. These may be checked every 5 years, or more frequently if you are over 21 years old. Skin check. Lung cancer screening. You may have this screening every year starting at age 91 if you have a 30-pack-year history of smoking and currently smoke or have quit within the past 15 years. Fecal occult blood test (FOBT) of the stool. You may have this test every year starting at age 16. Flexible sigmoidoscopy or colonoscopy. You may have a sigmoidoscopy every 5 years or a colonoscopy every 10 years starting at age 37. Prostate cancer screening. Recommendations will vary depending on your family history and other risks. Hepatitis C blood test. Hepatitis B blood test. Sexually transmitted disease (STD) testing. Diabetes screening. This is done by  checking your blood sugar (glucose) after you have not eaten for a while (fasting). You may have this done every 1-3 years. Abdominal aortic aneurysm (AAA) screening. You may need this if you are a current or former smoker. Osteoporosis. You may be screened starting at age 60 if you are at high risk. Talk with your health care provider about your test results, treatment options, and if necessary, the need for more tests. Vaccines  Your health care provider may recommend certain vaccines, such as: Influenza vaccine. This is recommended every year. Tetanus, diphtheria, and acellular pertussis (Tdap, Td) vaccine. You may need a Td booster every 10 years. Zoster vaccine. You may need this after age 17. Pneumococcal 13-valent conjugate (PCV13) vaccine. One dose is recommended after age 65. Pneumococcal polysaccharide (PPSV23) vaccine. One dose is recommended after age 32. Talk to your health care provider about which screenings and vaccines you need and how often you need them. This information is not intended to replace advice given to you by your health care provider. Make sure you discuss any questions you have with your health care provider. Document Released: 08/02/2015 Document Revised: 03/25/2016 Document Reviewed: 05/07/2015 Elsevier Interactive Patient Education  2017 [employer].  Fall Prevention in the Home Falls can cause injuries. They can happen to people of all ages. There are many things you can do to make your home safe and to help prevent falls. What can I do on the outside of my home? Regularly fix the edges of walkways and driveways and fix any cracks. Remove anything that might make you trip as you walk through a door, such as a raised step or threshold. Trim any bushes or trees on the path to your home. Use bright outdoor lighting. Clear any walking paths of anything that might make someone trip, such as rocks or tools. Regularly check to see if handrails are loose or  broken. Make sure that both sides of any steps have handrails. Any raised decks and porches should have guardrails on the edges. Have any leaves, snow, or ice cleared regularly. Use sand or salt on walking paths during winter. Clean up any spills in your garage right away. This includes  oil or grease spills. What can I do in the bathroom? Use night lights. Install grab bars by the toilet and in the tub and shower. Do not use towel bars as grab bars. Use non-skid mats or decals in the tub or shower. If you need to sit down in the shower, use a plastic, non-slip stool. Keep the floor dry. Clean up any water that spills on the floor as soon as it happens. Remove soap buildup in the tub or shower regularly. Attach bath mats securely with double-sided non-slip rug tape. Do not have throw rugs and other things on the floor that can make you trip. What can I do in the bedroom? Use night lights. Make sure that you have a light by your bed that is easy to reach. Do not use any sheets or blankets that are too big for your bed. They should not hang down onto the floor. Have a firm chair that has side arms. You can use this for support while you get dressed. Do not have throw rugs and other things on the floor that can make you trip. What can I do in the kitchen? Clean up any spills right away. Avoid walking on wet floors. Keep items that you use a lot in easy-to-reach places. If you need to reach something above you, use a strong step stool that has a grab bar. Keep electrical cords out of the way. Do not use floor polish or wax that makes floors slippery. If you must use wax, use non-skid floor wax. Do not have throw rugs and other things on the floor that can make you trip. What can I do with my stairs? Do not leave any items on the stairs. Make sure that there are handrails on both sides of the stairs and use them. Fix handrails that are broken or loose. Make sure that handrails are as long as  the stairways. Check any carpeting to make sure that it is firmly attached to the stairs. Fix any carpet that is loose or worn. Avoid having throw rugs at the top or bottom of the stairs. If you do have throw rugs, attach them to the floor with carpet tape. Make sure that you have a light switch at the top of the stairs and the bottom of the stairs. If you do not have them, ask someone to add them for you. What else can I do to help prevent falls? Wear shoes that: Do not have high heels. Have rubber bottoms. Are comfortable and fit you well. Are closed at the toe. Do not wear sandals. If you use a stepladder: Make sure that it is fully opened. Do not climb a closed stepladder. Make sure that both sides of the stepladder are locked into place. Ask someone to hold it for you, if possible. Clearly mark and make sure that you can see: Any grab bars or handrails. First and last steps. Where the edge of each step is. Use tools that help you move around (mobility aids) if they are needed. These include: Canes. Walkers. Scooters. Crutches. Turn on the lights when you go into a dark area. Replace any light bulbs as soon as they burn out. Set up your furniture so you have a clear path. Avoid moving your furniture around. If any of your floors are uneven, fix them. If there are any pets around you, be aware of where they are. Review your medicines with your doctor. Some medicines can make you feel dizzy. This can  increase your chance of falling. Ask your doctor what other things that you can do to help prevent falls. This information is not intended to replace advice given to you by your health care provider. Make sure you discuss any questions you have with your health care provider. Document Released: 05/02/2009 Document Revised: 12/12/2015 Document Reviewed: 08/10/2014 Elsevier Interactive Patient Education  2017 [employer].

## 2023-01-29 ENCOUNTER — Encounter (INDEPENDENT_AMBULATORY_CARE_PROVIDER_SITE_OTHER): Payer: Medicare Other | Admitting: Ophthalmology

## 2023-01-29 DIAGNOSIS — Z992 Dependence on renal dialysis: Secondary | ICD-10-CM | POA: Diagnosis not present

## 2023-01-29 DIAGNOSIS — D509 Iron deficiency anemia, unspecified: Secondary | ICD-10-CM | POA: Diagnosis not present

## 2023-01-29 DIAGNOSIS — N186 End stage renal disease: Secondary | ICD-10-CM | POA: Diagnosis not present

## 2023-01-29 DIAGNOSIS — N2581 Secondary hyperparathyroidism of renal origin: Secondary | ICD-10-CM | POA: Diagnosis not present

## 2023-01-29 DIAGNOSIS — D631 Anemia in chronic kidney disease: Secondary | ICD-10-CM | POA: Diagnosis not present

## 2023-02-01 DIAGNOSIS — D509 Iron deficiency anemia, unspecified: Secondary | ICD-10-CM | POA: Diagnosis not present

## 2023-02-01 DIAGNOSIS — Z992 Dependence on renal dialysis: Secondary | ICD-10-CM | POA: Diagnosis not present

## 2023-02-01 DIAGNOSIS — D631 Anemia in chronic kidney disease: Secondary | ICD-10-CM | POA: Diagnosis not present

## 2023-02-01 DIAGNOSIS — N2581 Secondary hyperparathyroidism of renal origin: Secondary | ICD-10-CM | POA: Diagnosis not present

## 2023-02-01 DIAGNOSIS — N186 End stage renal disease: Secondary | ICD-10-CM | POA: Diagnosis not present

## 2023-02-02 ENCOUNTER — Ambulatory Visit (INDEPENDENT_AMBULATORY_CARE_PROVIDER_SITE_OTHER): Payer: Medicare Other | Admitting: Ophthalmology

## 2023-02-02 DIAGNOSIS — E119 Type 2 diabetes mellitus without complications: Secondary | ICD-10-CM

## 2023-02-02 DIAGNOSIS — I10 Essential (primary) hypertension: Secondary | ICD-10-CM | POA: Diagnosis not present

## 2023-02-02 DIAGNOSIS — H33102 Unspecified retinoschisis, left eye: Secondary | ICD-10-CM | POA: Diagnosis not present

## 2023-02-02 DIAGNOSIS — Z7985 Long-term (current) use of injectable non-insulin antidiabetic drugs: Secondary | ICD-10-CM

## 2023-02-02 DIAGNOSIS — H35033 Hypertensive retinopathy, bilateral: Secondary | ICD-10-CM

## 2023-02-02 DIAGNOSIS — H3561 Retinal hemorrhage, right eye: Secondary | ICD-10-CM | POA: Diagnosis not present

## 2023-02-02 DIAGNOSIS — H25813 Combined forms of age-related cataract, bilateral: Secondary | ICD-10-CM | POA: Diagnosis not present

## 2023-02-03 DIAGNOSIS — N186 End stage renal disease: Secondary | ICD-10-CM | POA: Diagnosis not present

## 2023-02-03 DIAGNOSIS — D509 Iron deficiency anemia, unspecified: Secondary | ICD-10-CM | POA: Diagnosis not present

## 2023-02-03 DIAGNOSIS — N2581 Secondary hyperparathyroidism of renal origin: Secondary | ICD-10-CM | POA: Diagnosis not present

## 2023-02-03 DIAGNOSIS — Z992 Dependence on renal dialysis: Secondary | ICD-10-CM | POA: Diagnosis not present

## 2023-02-03 DIAGNOSIS — D631 Anemia in chronic kidney disease: Secondary | ICD-10-CM | POA: Diagnosis not present

## 2023-02-04 ENCOUNTER — Encounter (INDEPENDENT_AMBULATORY_CARE_PROVIDER_SITE_OTHER): Payer: Self-pay | Admitting: Ophthalmology

## 2023-02-05 DIAGNOSIS — N2581 Secondary hyperparathyroidism of renal origin: Secondary | ICD-10-CM | POA: Diagnosis not present

## 2023-02-05 DIAGNOSIS — Z992 Dependence on renal dialysis: Secondary | ICD-10-CM | POA: Diagnosis not present

## 2023-02-05 DIAGNOSIS — D509 Iron deficiency anemia, unspecified: Secondary | ICD-10-CM | POA: Diagnosis not present

## 2023-02-05 DIAGNOSIS — N186 End stage renal disease: Secondary | ICD-10-CM | POA: Diagnosis not present

## 2023-02-05 DIAGNOSIS — D631 Anemia in chronic kidney disease: Secondary | ICD-10-CM | POA: Diagnosis not present

## 2023-02-08 DIAGNOSIS — Z992 Dependence on renal dialysis: Secondary | ICD-10-CM | POA: Diagnosis not present

## 2023-02-08 DIAGNOSIS — N2581 Secondary hyperparathyroidism of renal origin: Secondary | ICD-10-CM | POA: Diagnosis not present

## 2023-02-08 DIAGNOSIS — N186 End stage renal disease: Secondary | ICD-10-CM | POA: Diagnosis not present

## 2023-02-08 DIAGNOSIS — D631 Anemia in chronic kidney disease: Secondary | ICD-10-CM | POA: Diagnosis not present

## 2023-02-08 DIAGNOSIS — D509 Iron deficiency anemia, unspecified: Secondary | ICD-10-CM | POA: Diagnosis not present

## 2023-02-09 DIAGNOSIS — Z7689 Persons encountering health services in other specified circumstances: Secondary | ICD-10-CM | POA: Diagnosis not present

## 2023-02-09 DIAGNOSIS — R202 Paresthesia of skin: Secondary | ICD-10-CM | POA: Diagnosis not present

## 2023-02-09 DIAGNOSIS — R200 Anesthesia of skin: Secondary | ICD-10-CM | POA: Diagnosis not present

## 2023-02-09 DIAGNOSIS — M21371 Foot drop, right foot: Secondary | ICD-10-CM | POA: Diagnosis not present

## 2023-02-10 ENCOUNTER — Other Ambulatory Visit: Payer: Self-pay | Admitting: Physician Assistant

## 2023-02-10 DIAGNOSIS — D631 Anemia in chronic kidney disease: Secondary | ICD-10-CM | POA: Diagnosis not present

## 2023-02-10 DIAGNOSIS — M21371 Foot drop, right foot: Secondary | ICD-10-CM

## 2023-02-10 DIAGNOSIS — D509 Iron deficiency anemia, unspecified: Secondary | ICD-10-CM | POA: Diagnosis not present

## 2023-02-10 DIAGNOSIS — Z992 Dependence on renal dialysis: Secondary | ICD-10-CM | POA: Diagnosis not present

## 2023-02-10 DIAGNOSIS — N186 End stage renal disease: Secondary | ICD-10-CM | POA: Diagnosis not present

## 2023-02-10 DIAGNOSIS — N2581 Secondary hyperparathyroidism of renal origin: Secondary | ICD-10-CM | POA: Diagnosis not present

## 2023-02-12 DIAGNOSIS — D631 Anemia in chronic kidney disease: Secondary | ICD-10-CM | POA: Diagnosis not present

## 2023-02-12 DIAGNOSIS — D509 Iron deficiency anemia, unspecified: Secondary | ICD-10-CM | POA: Diagnosis not present

## 2023-02-12 DIAGNOSIS — N2581 Secondary hyperparathyroidism of renal origin: Secondary | ICD-10-CM | POA: Diagnosis not present

## 2023-02-12 DIAGNOSIS — N186 End stage renal disease: Secondary | ICD-10-CM | POA: Diagnosis not present

## 2023-02-12 DIAGNOSIS — Z992 Dependence on renal dialysis: Secondary | ICD-10-CM | POA: Diagnosis not present

## 2023-02-15 DIAGNOSIS — D509 Iron deficiency anemia, unspecified: Secondary | ICD-10-CM | POA: Diagnosis not present

## 2023-02-15 DIAGNOSIS — N186 End stage renal disease: Secondary | ICD-10-CM | POA: Diagnosis not present

## 2023-02-15 DIAGNOSIS — D631 Anemia in chronic kidney disease: Secondary | ICD-10-CM | POA: Diagnosis not present

## 2023-02-15 DIAGNOSIS — N2581 Secondary hyperparathyroidism of renal origin: Secondary | ICD-10-CM | POA: Diagnosis not present

## 2023-02-15 DIAGNOSIS — Z992 Dependence on renal dialysis: Secondary | ICD-10-CM | POA: Diagnosis not present

## 2023-02-17 DIAGNOSIS — D631 Anemia in chronic kidney disease: Secondary | ICD-10-CM | POA: Diagnosis not present

## 2023-02-17 DIAGNOSIS — N186 End stage renal disease: Secondary | ICD-10-CM | POA: Diagnosis not present

## 2023-02-17 DIAGNOSIS — Q612 Polycystic kidney, adult type: Secondary | ICD-10-CM | POA: Diagnosis not present

## 2023-02-17 DIAGNOSIS — N2581 Secondary hyperparathyroidism of renal origin: Secondary | ICD-10-CM | POA: Diagnosis not present

## 2023-02-17 DIAGNOSIS — Z992 Dependence on renal dialysis: Secondary | ICD-10-CM | POA: Diagnosis not present

## 2023-02-17 DIAGNOSIS — D509 Iron deficiency anemia, unspecified: Secondary | ICD-10-CM | POA: Diagnosis not present

## 2023-02-19 DIAGNOSIS — Z23 Encounter for immunization: Secondary | ICD-10-CM | POA: Diagnosis not present

## 2023-02-19 DIAGNOSIS — N186 End stage renal disease: Secondary | ICD-10-CM | POA: Diagnosis not present

## 2023-02-19 DIAGNOSIS — D509 Iron deficiency anemia, unspecified: Secondary | ICD-10-CM | POA: Diagnosis not present

## 2023-02-19 DIAGNOSIS — Z992 Dependence on renal dialysis: Secondary | ICD-10-CM | POA: Diagnosis not present

## 2023-02-19 DIAGNOSIS — D631 Anemia in chronic kidney disease: Secondary | ICD-10-CM | POA: Diagnosis not present

## 2023-02-19 DIAGNOSIS — N2581 Secondary hyperparathyroidism of renal origin: Secondary | ICD-10-CM | POA: Diagnosis not present

## 2023-02-22 DIAGNOSIS — N2581 Secondary hyperparathyroidism of renal origin: Secondary | ICD-10-CM | POA: Diagnosis not present

## 2023-02-22 DIAGNOSIS — D509 Iron deficiency anemia, unspecified: Secondary | ICD-10-CM | POA: Diagnosis not present

## 2023-02-22 DIAGNOSIS — Z23 Encounter for immunization: Secondary | ICD-10-CM | POA: Diagnosis not present

## 2023-02-22 DIAGNOSIS — N186 End stage renal disease: Secondary | ICD-10-CM | POA: Diagnosis not present

## 2023-02-22 DIAGNOSIS — Z992 Dependence on renal dialysis: Secondary | ICD-10-CM | POA: Diagnosis not present

## 2023-02-22 DIAGNOSIS — D631 Anemia in chronic kidney disease: Secondary | ICD-10-CM | POA: Diagnosis not present

## 2023-02-24 DIAGNOSIS — Z992 Dependence on renal dialysis: Secondary | ICD-10-CM | POA: Diagnosis not present

## 2023-02-24 DIAGNOSIS — N2581 Secondary hyperparathyroidism of renal origin: Secondary | ICD-10-CM | POA: Diagnosis not present

## 2023-02-24 DIAGNOSIS — N186 End stage renal disease: Secondary | ICD-10-CM | POA: Diagnosis not present

## 2023-02-24 DIAGNOSIS — D509 Iron deficiency anemia, unspecified: Secondary | ICD-10-CM | POA: Diagnosis not present

## 2023-02-24 DIAGNOSIS — Z23 Encounter for immunization: Secondary | ICD-10-CM | POA: Diagnosis not present

## 2023-02-24 DIAGNOSIS — D631 Anemia in chronic kidney disease: Secondary | ICD-10-CM | POA: Diagnosis not present

## 2023-02-25 ENCOUNTER — Ambulatory Visit: Payer: Medicare Other | Attending: Physician Assistant

## 2023-02-25 ENCOUNTER — Other Ambulatory Visit: Payer: Self-pay

## 2023-02-25 DIAGNOSIS — M21371 Foot drop, right foot: Secondary | ICD-10-CM | POA: Insufficient documentation

## 2023-02-25 DIAGNOSIS — M6281 Muscle weakness (generalized): Secondary | ICD-10-CM | POA: Diagnosis not present

## 2023-02-25 NOTE — Therapy (Addendum)
OUTPATIENT PHYSICAL THERAPY THORACOLUMBAR EVALUATION   Patient Name: Mark Bauer MRN: [id number] DOB:12-15-1955, 67 y.o., male Today's Date: 02/26/2023  END OF SESSION:  PT End of Session - 02/25/23 1230     Visit Number 1    Number of Visits 7    Date for PT Re-Evaluation 04/08/23    PT Start Time 1030    PT Stop Time 1113    PT Time Calculation (min) 43 min    Activity Tolerance Patient tolerated treatment well             Past Medical History:  Diagnosis Date   Allergy    Bilateral undescended testicles    Cancer (HCC)    melanoma on forehead   Cataract    OU   Diabetes mellitus without complication (HCC)    Dysrhythmia    a fib   GERD (gastroesophageal reflux disease)    Gout    Hepatitis    polycystic    Hypertension    Hypokalemia    Mixed hyperlipidemia    Polycystic kidney    Polycystic liver disease    Vitamin D deficiency    Past Surgical History:  Procedure Laterality Date   A/V FISTULAGRAM Left 12/15/2022   Procedure: A/V Fistulagram;  Surgeon: Renford Dills, MD;  Location: [facility];  Service: Cardiovascular;  Laterality: Left;   BASCILIC VEIN TRANSPOSITION Left 02/23/2020   Procedure: BASCILIC VEIN TRANSPOSITION (SINGLE STAGE);  Surgeon: Renford Dills, MD;  Location: [facility] ORS;  Service: Vascular;  Laterality: Left;   COLONOSCOPY  2012   hemorrhoids   melanoma removal     forehead   Patient Active Problem List   Diagnosis Date Noted   Right foot drop 01/05/2023   End stage renal disease (HCC) 06/04/2021   Anticoagulated 05/12/2021   Complication of vascular access for dialysis 06/06/2020   Fistula 02/23/2020   Polycystic liver disease 01/10/2020   Polycystic kidney disease 03/01/2019   Metabolic acidosis 09/13/2018   CKD stage 4 due to type 2 diabetes mellitus (HCC) 01/25/2018   Type 2 diabetes mellitus with diabetic nephropathy, without long-term current use of insulin (HCC) 01/25/2018   Secondary  hyperparathyroidism of renal origin (HCC) 08/19/2017   Chronic atrial fibrillation (HCC) 01/06/2017   Essential hypertension 01/06/2017   Hyperuricemia 01/06/2017   Mixed hyperlipidemia 01/06/2017   Gastroesophageal reflux disease 01/06/2017   Obesity 12/26/2012    PCP: Duanne Limerick, MD  REFERRING PROVIDER: Bridgette Habermann, PA-C  REFERRING DIAG: [phone] (ICD-10-CM) - Right foot drop   Rationale for Evaluation and Treatment: Rehabilitation  THERAPY DIAG:  Foot drop, right  Muscle weakness (generalized)  ONSET DATE: June, 2024  SUBJECTIVE:  SUBJECTIVE STATEMENT: Pt woke up one morning, unable to DF his R foot. Insidious onset with no MOI specific to his current problem.   PERTINENT HISTORY:  Patient presenting with foot drop which began 1.5 months ago. States this began suddenly. States that he went to bed and woke up with symptoms. Symptoms came on abruptly. No known trigger. No recent injuries. States over the past month and a half symptoms of gotten about "3% better". He is able to very slightly dorsiflex the right foot. States that he has adjusted his walking to compensate for this. Walking without assistance. No recent falls. He feels generally steady. Does report numbness and tingling in bilateral lower extremities. States that this began about 6 to 8 months ago after being diagnosed with peripheral neuropathy. This is limited to the feet. It is constant. Can be worse in the evenings. Pt denies radicular pain or sensory changes. Does have prior history of low back issues including herniated disc.  PAIN:  Are you having pain? Yes: NPRS scale: 0/10 Pain location: No pain, neuropathy related pain at dorsum of bilat feet 5-6/10. Pain description: N/A Aggravating factors: N/A Relieving factors:  Medications (paragaba)  PRECAUTIONS: None  RED FLAGS: Bowel or bladder incontinence: No and Spinal tumors: No   WEIGHT BEARING RESTRICTIONS: No  FALLS:  Has patient fallen in last 6 months? No and "stumbles"  OCCUPATION: Retired, Art gallery manager   PLOF: Independent  PATIENT GOALS: "Improve the foot drop"  NEXT MD VISIT: September, 2024 (Neurologist)   OBJECTIVE:   DIAGNOSTIC FINDINGS:  MRI of the brain w/o contrast, and lumbar spine scheduled for 03/04/2023   PATIENT SURVEYS:  FOTO 73/79  SCREENING FOR RED FLAGS: Bowel or bladder incontinence: No Spinal tumors: No Cauda equina syndrome: No  COGNITION: Overall cognitive status: Within functional limits for tasks assessed     SENSATION: UQNS: No sensation differences between R/L  LQNS: No sensation differences between R/L. Neuropathy of bilat feet noted 2/2 diabetes.   MUSCLE LENGTH: Gastroc-soleus complex tightness bilat. Not formally measured.   POSTURE: No Significant postural limitations  PALPATION: No TTP noted.    CERVICAL AROM: Cervical screen all AROM WNL grossly, no increased pain w/ movement. No indication to further test AROM.   LOWER EXTREMITY ROM:     Active  Right eval Left eval  Hip flexion    Hip extension    Hip abduction    Hip adduction    Hip internal rotation    Hip external rotation    Knee flexion    Knee extension    Ankle dorsiflexion 9 30  Ankle plantarflexion [facility] WFL  Ankle inversion [facility]  Ankle eversion WFL WFL   (Blank rows = not tested)  LOWER EXTREMITY MMT:    MMT Right eval Left eval  Hip flexion 4+ 4  Hip extension    Hip abduction    Hip adduction    Hip internal rotation 4+ 4+  Hip external rotation 4+ 4+  Knee flexion 4+ 4+  Knee extension 4+ 4+  Ankle dorsiflexion 2 4+  Ankle plantarflexion 4+ 4+  Ankle inversion 4+ 4+  Ankle eversion 4+ 4+   (Blank rows = not tested)  ANKLE JOINT MOBILITY: Mild hypomobility A/P noted w/ talocrural mobilization R  >L, no concordant pain noted.   LUMBAR SPECIAL TESTS:  FABER test: Negative  FUNCTIONAL TESTS:    FGA: 22/30 (please see flowsheets)      SLS: RLE: 5.5 sec, LLE: 10 sec  TODAY'S TREATMENT:  DATE: 02/26/23  Reviewed HEP, prognosis, and POC.     PATIENT EDUCATION:  Education details: HEP Person educated: Patient Education method: Explanation, Facilities manager, and Handouts Education comprehension: verbalized understanding  HOME EXERCISE PROGRAM: Access Code: [email or web address] URL: [email or web address] Date: 02/26/2023 Prepared by: Ronnie Derby  Exercises - Seated Ankle Circles  - 1 x daily - 7 x weekly - 2 sets - 15 reps - Gastroc Stretch with Foot at Wall  - 1 x daily - 7 x weekly - 1 sets - 3 reps - 30 hold - Long Sitting Calf Stretch with Strap  - 1 x daily - 7 x weekly - 3 sets - 10 reps - Seated Ankle Dorsiflexion AROM  - 1 x daily - 7 x weekly - 3 sets - 6-8 reps - Standing March with Counter Support  - 1 x daily - 7 x weekly - 3 sets - 8 reps  ASSESSMENT:  CLINICAL IMPRESSION: Patient is a 67 y.o. M who was seen today for physical therapy evaluation and treatment for R foot drop. Pt's R foot drop developed with insidious onset, and has slightly improved since beginning 2 months ago. Pt presents with decreased R foot DF AROM and MMT, however full R foot PROM present w/ mild talocrural joint hypomobility and gastroc-soleus complex muscle tightness. LQNS/ UQNS negative for any neurological concerns, apart from neuropathic related sensation changes 2/2 diabetes. Pt notes decreased SLS time RLE worse than LLE, signifying decreased R ankle stability. FGA performed, pt scoring a 22/30 on this assessment indicating decreased fall risk, however presenting opportunities to improve balance for increased safety during functional activity. Pt would continue to  benefit from skilled PT interventions to address pain, strength and balance deficits to improve QoL and return to PLOF.   OBJECTIVE IMPAIRMENTS: decreased mobility, decreased ROM, decreased strength, and hypomobility.   ACTIVITY LIMITATIONS: carrying heavy objects  PARTICIPATION LIMITATIONS: N/A  PERSONAL FACTORS: Age, Time since onset of injury/illness/exacerbation, and 3+ comorbidities: CKD, HTN, Type 2 diabetes, lymphedema  are also affecting patient's functional outcome.   REHAB POTENTIAL: Good  CLINICAL DECISION MAKING: Stable/uncomplicated  EVALUATION COMPLEXITY: Low   GOALS: Goals reviewed with patient? Yes  SHORT TERM GOALS: Target date: 03/25/2023  Pt will be IND with HEP to increase R LE functional mobility in ankle DF.  Baseline: HEP given to pt 02/25/23 Goal status: INITIAL   LONG TERM GOALS: Target date: 04/08/2023  Pt will improve FOTO to target score to demonstrate clinically significant improvement in functional mobility. Baseline: 02/25/2023: 73/79 Goal status: INITIAL  2.  Pt will improve FGA score by 4 points to display clinically significant improvement in balance.  Baseline: 02/25/2023: 22/30  Goal status: INITIAL  3.  Pt will improve R LE DF AROM to 30 deg (equal to L LE DF) to enhance toe clearance ability and reduce risk of falls.   Baseline: 02/25/2023: 9 deg. Goal status: INITIAL  PLAN:  PT FREQUENCY: 1x/week  PT DURATION: 6 weeks  PLANNED INTERVENTIONS: Therapeutic exercises, Therapeutic activity, Neuromuscular re-education, Balance training, Gait training, Patient/Family education, Self Care, Joint mobilization, Joint manipulation, Stair training, Dry Needling, Electrical stimulation, Spinal manipulation, Spinal mobilization, Cryotherapy, and Moist heat.  PLAN FOR NEXT SESSION: Review HEP, progress exercises to improve R LE DF AROM and MMT.  Lovie Macadamia, SPT Physical Therapist- [place]  [facility]   Clyde. Fairly IV, PT, DPT Physical Therapist- [place]  [facility] 02/26/2023, 11:20 AM

## 2023-02-26 DIAGNOSIS — N186 End stage renal disease: Secondary | ICD-10-CM | POA: Diagnosis not present

## 2023-02-26 DIAGNOSIS — D631 Anemia in chronic kidney disease: Secondary | ICD-10-CM | POA: Diagnosis not present

## 2023-02-26 DIAGNOSIS — D509 Iron deficiency anemia, unspecified: Secondary | ICD-10-CM | POA: Diagnosis not present

## 2023-02-26 DIAGNOSIS — Z992 Dependence on renal dialysis: Secondary | ICD-10-CM | POA: Diagnosis not present

## 2023-02-26 DIAGNOSIS — Z23 Encounter for immunization: Secondary | ICD-10-CM | POA: Diagnosis not present

## 2023-02-26 DIAGNOSIS — N2581 Secondary hyperparathyroidism of renal origin: Secondary | ICD-10-CM | POA: Diagnosis not present

## 2023-02-26 NOTE — Addendum Note (Signed)
Addended by: Georgia Duff IV on: 02/26/2023 11:22 AM   Modules accepted: Orders

## 2023-03-01 DIAGNOSIS — N186 End stage renal disease: Secondary | ICD-10-CM | POA: Diagnosis not present

## 2023-03-01 DIAGNOSIS — N2581 Secondary hyperparathyroidism of renal origin: Secondary | ICD-10-CM | POA: Diagnosis not present

## 2023-03-01 DIAGNOSIS — D509 Iron deficiency anemia, unspecified: Secondary | ICD-10-CM | POA: Diagnosis not present

## 2023-03-01 DIAGNOSIS — Z23 Encounter for immunization: Secondary | ICD-10-CM | POA: Diagnosis not present

## 2023-03-01 DIAGNOSIS — D631 Anemia in chronic kidney disease: Secondary | ICD-10-CM | POA: Diagnosis not present

## 2023-03-01 DIAGNOSIS — Z992 Dependence on renal dialysis: Secondary | ICD-10-CM | POA: Diagnosis not present

## 2023-03-02 ENCOUNTER — Ambulatory Visit: Payer: Medicare Other

## 2023-03-02 DIAGNOSIS — M21371 Foot drop, right foot: Secondary | ICD-10-CM

## 2023-03-02 DIAGNOSIS — M6281 Muscle weakness (generalized): Secondary | ICD-10-CM | POA: Diagnosis not present

## 2023-03-02 NOTE — Therapy (Addendum)
OUTPATIENT PHYSICAL THERAPY THORACOLUMBAR TRATMENT   Patient Name: Mark Bauer MRN: [id number] DOB:Sep 22, 1955, 67 y.o., male Today's Date: 03/02/2023  END OF SESSION:  PT End of Session - 03/02/23 1110     Visit Number 2    Number of Visits 7    Date for PT Re-Evaluation 04/08/23    PT Start Time 1030    PT Stop Time 1110    PT Time Calculation (min) 40 min    Activity Tolerance Patient tolerated treatment well              Past Medical History:  Diagnosis Date   Allergy    Bilateral undescended testicles    Cancer (HCC)    melanoma on forehead   Cataract    OU   Diabetes mellitus without complication (HCC)    Dysrhythmia    a fib   GERD (gastroesophageal reflux disease)    Gout    Hepatitis    polycystic    Hypertension    Hypokalemia    Mixed hyperlipidemia    Polycystic kidney    Polycystic liver disease    Vitamin D deficiency    Past Surgical History:  Procedure Laterality Date   A/V FISTULAGRAM Left 12/15/2022   Procedure: A/V Fistulagram;  Surgeon: Renford Dills, MD;  Location: [facility];  Service: Cardiovascular;  Laterality: Left;   BASCILIC VEIN TRANSPOSITION Left 02/23/2020   Procedure: BASCILIC VEIN TRANSPOSITION (SINGLE STAGE);  Surgeon: Renford Dills, MD;  Location: [facility];  Service: Vascular;  Laterality: Left;   COLONOSCOPY  2012   hemorrhoids   melanoma removal     forehead   Patient Active Problem List   Diagnosis Date Noted   Right foot drop 01/05/2023   End stage renal disease (HCC) 06/04/2021   Anticoagulated 05/12/2021   Complication of vascular access for dialysis 06/06/2020   Fistula 02/23/2020   Polycystic liver disease 01/10/2020   Polycystic kidney disease 03/01/2019   Metabolic acidosis 09/13/2018   CKD stage 4 due to type 2 diabetes mellitus (HCC) 01/25/2018   Type 2 diabetes mellitus with diabetic nephropathy, without long-term current use of insulin (HCC) 01/25/2018   Secondary  hyperparathyroidism of renal origin (HCC) 08/19/2017   Chronic atrial fibrillation (HCC) 01/06/2017   Essential hypertension 01/06/2017   Hyperuricemia 01/06/2017   Mixed hyperlipidemia 01/06/2017   Gastroesophageal reflux disease 01/06/2017   Obesity 12/26/2012    PCP: Duanne Limerick, MD  REFERRING PROVIDER: Bridgette Habermann, PA-C  REFERRING DIAG: [phone] (ICD-10-CM) - Right foot drop   Rationale for Evaluation and Treatment: Rehabilitation  THERAPY DIAG:  Muscle weakness (generalized)  Foot drop, right  ONSET DATE: June, 2024  SUBJECTIVE:  SUBJECTIVE STATEMENT: Pt reports no pain in the RLE at today's session. States there has not been any significant changes in the RLE since the last session and he has been compliant with his HEP, however reports pain in great toe with gastroc stretch with foot at wall.   PERTINENT HISTORY:  Patient presenting with foot drop which began 1.5 months ago. States this began suddenly. States that he went to bed and woke up with symptoms. Symptoms came on abruptly. No known trigger. No recent injuries. States over the past month and a half symptoms of gotten about "3% better". He is able to very slightly dorsiflex the right foot. States that he has adjusted his walking to compensate for this. Walking without assistance. No recent falls. He feels generally steady. Does report numbness and tingling in bilateral lower extremities. States that this began about 6 to 8 months ago after being diagnosed with peripheral neuropathy. This is limited to the feet. It is constant. Can be worse in the evenings. Pt denies radicular pain or sensory changes. Does have prior history of low back issues including herniated disc.  PAIN:  Are you having pain? Yes: NPRS scale: 0/10 Pain location:  No pain, neuropathy related pain at dorsum of bilat feet 5-6/10. Pain description: N/A Aggravating factors: N/A Relieving factors: Medications (paragaba)  PRECAUTIONS: None  RED FLAGS: Bowel or bladder incontinence: No and Spinal tumors: No   WEIGHT BEARING RESTRICTIONS: No  FALLS:  Has patient fallen in last 6 months? No and "stumbles"  OCCUPATION: Retired, Art gallery manager   PLOF: Independent  PATIENT GOALS: "Improve the foot drop"  NEXT MD VISIT: September, 2024 (Neurologist)   OBJECTIVE:   DIAGNOSTIC FINDINGS:  MRI of the brain w/o contrast, and lumbar spine scheduled for 03/04/2023   PATIENT SURVEYS:  FOTO 73/79  SCREENING FOR RED FLAGS: Bowel or bladder incontinence: No Spinal tumors: No Cauda equina syndrome: No  COGNITION: Overall cognitive status: Within functional limits for tasks assessed     SENSATION: UQNS: No sensation differences between R/L  LQNS: No sensation differences between R/L. Neuropathy of bilat feet noted 2/2 diabetes.   MUSCLE LENGTH: Gastroc-soleus complex tightness bilat. Not formally measured.   POSTURE: No Significant postural limitations  PALPATION: No TTP noted.    CERVICAL AROM: Cervical screen all AROM WNL grossly, no increased pain w/ movement. No indication to further test AROM.   LOWER EXTREMITY ROM:     Active  Right eval Left eval Right  03/02/23  Hip flexion     Hip extension     Hip abduction     Hip adduction     Hip internal rotation     Hip external rotation     Knee flexion     Knee extension     Ankle dorsiflexion 9 30 12   Ankle plantarflexion [facility] WFL   Ankle inversion [facility] WFL   Ankle eversion WFL WFL    (Blank rows = not tested)  LOWER EXTREMITY MMT:    MMT Right eval Left eval  Hip flexion 4+ 4  Hip extension    Hip abduction    Hip adduction    Hip internal rotation 4+ 4+  Hip external rotation 4+ 4+  Knee flexion 4+ 4+  Knee extension 4+ 4+  Ankle dorsiflexion 2 4+  Ankle plantarflexion  4+ 4+  Ankle inversion 4+ 4+  Ankle eversion 4+ 4+   (Blank rows = not tested)  ANKLE JOINT MOBILITY: Mild hypomobility A/P noted w/ talocrural mobilization R >  L, no concordant pain noted.   LUMBAR SPECIAL TESTS:  FABER test: Negative  FUNCTIONAL TESTS:    FGA: 22/30 (please see flowsheets)     SLS: RLE: 5.5 sec, LLE: 10 sec  TODAY'S TREATMENT:                                                                                                                              DATE: 03/02/23   Reviewed HEP:  - Seated Ankle Circles  - 1 x daily - 7 x weekly - 2 sets - 15 reps -  Gastroc Stretch with Foot at Wall : Discontinued this exercise d/t lack of dorsiflexion AROM to achieve foot on wall positioning causing increased discomfort to great toe replaced with long sitting - Long Sitting Calf Stretch with Strap  - 1 x daily - 7 x weekly - 3 sets - 10 reps - Seated Ankle Dorsiflexion AROM  - 1 x daily - 7 x weekly - 3 sets - 8 reps - Standing March with Counter Support  - 1 x daily - 7 x weekly - 3 sets - 8 reps bilat LE's, v/c to emphasize DF w/ marching on RLE.   Seated BAPS board lvl 2 DF/PF 1 x65min, increased to lvl 3 DF/PF 2 x52min  Long sitting RLE Inversion/ Eversion w/ GTB 3 x10 (stabilizing of RLE at knee to prevent other muscular compensation)   RLE CKC ankle DF mobilization with movement with mob belt at talocrural joint: 2x10.    PATIENT EDUCATION:  Education details: HEP Person educated: Patient Education method: Explanation, Facilities manager, and Handouts Education comprehension: verbalized understanding  HOME EXERCISE PROGRAM: Access Code: [email or web address] URL: [email or web address] Date: 02/26/2023 Prepared by: Mark Bauer  Exercises - Seated Ankle Circles  - 1 x daily - 7 x weekly - 2 sets - 15 reps - Gastroc Stretch with Foot at Wall  - 1 x daily - 7 x weekly - 1 sets - 3 reps - 30 hold - Long Sitting Calf Stretch with Strap  - 1 x daily - 7 x weekly - 3 sets  - 10 reps - Seated Ankle Dorsiflexion AROM  - 1 x daily - 7 x weekly - 3 sets - 6-8 reps - Standing March with Counter Support  - 1 x daily - 7 x weekly - 3 sets - 8 reps  ASSESSMENT:  CLINICAL IMPRESSION:  Session focused on reviewing pt's HEP, and improving RLE AROM and strength. Upon reviewing HEP, adjustments were made to this plan to assist in decreasing great toe pain associated w/ gastroc stretch with R foot at the wall. Pt displayed improvements in RLE DF ROM noted with an increase of 3 deg at today's session. Pt continues to note decreased strength w/ RLE displayed with difficulty when trialing inversion/ eversion BAPS board exercises at today's session, noting increased compensation from proximal RLE. Pt would continue to benefit from skilled PT interventions to address remaining strength, and ROM deficits  in the R foot.  OBJECTIVE IMPAIRMENTS: decreased mobility, decreased ROM, decreased strength, and hypomobility.   ACTIVITY LIMITATIONS: carrying heavy objects  PARTICIPATION LIMITATIONS: N/A  PERSONAL FACTORS: Age, Time since onset of injury/illness/exacerbation, and 3+ comorbidities: CKD, HTN, Type 2 diabetes, lymphedema  are also affecting patient's functional outcome.   REHAB POTENTIAL: Good  CLINICAL DECISION MAKING: Stable/uncomplicated  EVALUATION COMPLEXITY: Low   GOALS: Goals reviewed with patient? Yes  SHORT TERM GOALS: Target date: 03/25/2023  Pt will be IND with HEP to increase R LE functional mobility in ankle DF.  Baseline: HEP given to pt 02/25/23 Goal status: INITIAL   LONG TERM GOALS: Target date: 04/08/2023  Pt will improve FOTO to target score to demonstrate clinically significant improvement in functional mobility. Baseline: 02/25/2023: 73/79 Goal status: INITIAL  2.  Pt will improve FGA score by 4 points to display clinically significant improvement in balance.  Baseline: 02/25/2023: 22/30  Goal status: INITIAL  3.  Pt will improve R LE DF  AROM to 30 deg (equal to L LE DF) to enhance toe clearance ability and reduce risk of falls.   Baseline: 02/25/2023: 9 deg. Goal status: INITIAL  PLAN:  PT FREQUENCY: 1x/week  PT DURATION: 6 weeks  PLANNED INTERVENTIONS: Therapeutic exercises, Therapeutic activity, Neuromuscular re-education, Balance training, Gait training, Patient/Family education, Self Care, Joint mobilization, Joint manipulation, Stair training, Dry Needling, Electrical stimulation, Spinal manipulation, Spinal mobilization, Cryotherapy, and Moist heat.  PLAN FOR NEXT SESSION: Progress exercises to improve R LE DF AROM and MMT, update HEP.   Lovie Macadamia, SPT   Delphia Grates. Fairly IV, PT, DPT Physical Therapist- [place]  [facility] 03/02/2023, 1:03 PM

## 2023-03-03 DIAGNOSIS — Z23 Encounter for immunization: Secondary | ICD-10-CM | POA: Diagnosis not present

## 2023-03-03 DIAGNOSIS — N2581 Secondary hyperparathyroidism of renal origin: Secondary | ICD-10-CM | POA: Diagnosis not present

## 2023-03-03 DIAGNOSIS — Z992 Dependence on renal dialysis: Secondary | ICD-10-CM | POA: Diagnosis not present

## 2023-03-03 DIAGNOSIS — D509 Iron deficiency anemia, unspecified: Secondary | ICD-10-CM | POA: Diagnosis not present

## 2023-03-03 DIAGNOSIS — D631 Anemia in chronic kidney disease: Secondary | ICD-10-CM | POA: Diagnosis not present

## 2023-03-03 DIAGNOSIS — N186 End stage renal disease: Secondary | ICD-10-CM | POA: Diagnosis not present

## 2023-03-04 ENCOUNTER — Ambulatory Visit
Admission: RE | Admit: 2023-03-04 | Discharge: 2023-03-04 | Disposition: A | Discharge status: Home / Self Care | Payer: Medicare Other | Source: Ambulatory Visit | Attending: Physician Assistant | Admitting: Physician Assistant

## 2023-03-04 DIAGNOSIS — M5126 Other intervertebral disc displacement, lumbar region: Secondary | ICD-10-CM | POA: Diagnosis not present

## 2023-03-04 DIAGNOSIS — M21379 Foot drop, unspecified foot: Secondary | ICD-10-CM | POA: Diagnosis not present

## 2023-03-04 DIAGNOSIS — M21371 Foot drop, right foot: Secondary | ICD-10-CM

## 2023-03-04 DIAGNOSIS — M47816 Spondylosis without myelopathy or radiculopathy, lumbar region: Secondary | ICD-10-CM | POA: Diagnosis not present

## 2023-03-05 DIAGNOSIS — Z992 Dependence on renal dialysis: Secondary | ICD-10-CM | POA: Diagnosis not present

## 2023-03-05 DIAGNOSIS — D509 Iron deficiency anemia, unspecified: Secondary | ICD-10-CM | POA: Diagnosis not present

## 2023-03-05 DIAGNOSIS — N2581 Secondary hyperparathyroidism of renal origin: Secondary | ICD-10-CM | POA: Diagnosis not present

## 2023-03-05 DIAGNOSIS — Z23 Encounter for immunization: Secondary | ICD-10-CM | POA: Diagnosis not present

## 2023-03-05 DIAGNOSIS — N186 End stage renal disease: Secondary | ICD-10-CM | POA: Diagnosis not present

## 2023-03-05 DIAGNOSIS — D631 Anemia in chronic kidney disease: Secondary | ICD-10-CM | POA: Diagnosis not present

## 2023-03-08 DIAGNOSIS — Z992 Dependence on renal dialysis: Secondary | ICD-10-CM | POA: Diagnosis not present

## 2023-03-08 DIAGNOSIS — N2581 Secondary hyperparathyroidism of renal origin: Secondary | ICD-10-CM | POA: Diagnosis not present

## 2023-03-08 DIAGNOSIS — D631 Anemia in chronic kidney disease: Secondary | ICD-10-CM | POA: Diagnosis not present

## 2023-03-08 DIAGNOSIS — N186 End stage renal disease: Secondary | ICD-10-CM | POA: Diagnosis not present

## 2023-03-08 DIAGNOSIS — D509 Iron deficiency anemia, unspecified: Secondary | ICD-10-CM | POA: Diagnosis not present

## 2023-03-08 DIAGNOSIS — Z23 Encounter for immunization: Secondary | ICD-10-CM | POA: Diagnosis not present

## 2023-03-10 DIAGNOSIS — D631 Anemia in chronic kidney disease: Secondary | ICD-10-CM | POA: Diagnosis not present

## 2023-03-10 DIAGNOSIS — N186 End stage renal disease: Secondary | ICD-10-CM | POA: Diagnosis not present

## 2023-03-10 DIAGNOSIS — D509 Iron deficiency anemia, unspecified: Secondary | ICD-10-CM | POA: Diagnosis not present

## 2023-03-10 DIAGNOSIS — Z23 Encounter for immunization: Secondary | ICD-10-CM | POA: Diagnosis not present

## 2023-03-10 DIAGNOSIS — Z992 Dependence on renal dialysis: Secondary | ICD-10-CM | POA: Diagnosis not present

## 2023-03-10 DIAGNOSIS — N2581 Secondary hyperparathyroidism of renal origin: Secondary | ICD-10-CM | POA: Diagnosis not present

## 2023-03-11 ENCOUNTER — Ambulatory Visit: Payer: Medicare Other

## 2023-03-11 DIAGNOSIS — M6281 Muscle weakness (generalized): Secondary | ICD-10-CM

## 2023-03-11 DIAGNOSIS — M21371 Foot drop, right foot: Secondary | ICD-10-CM

## 2023-03-11 NOTE — Therapy (Addendum)
OUTPATIENT PHYSICAL THERAPY THORACOLUMBAR TREATMENT   Patient Name: Mark Bauer MRN: [id number] DOB:Jul 23, 1955, 68 y.o., male Today's Date: 03/11/2023  END OF SESSION:  PT End of Session - 03/11/23 1115     Visit Number 3    Number of Visits 7    Date for PT Re-Evaluation 04/08/23    PT Start Time 1115    PT Stop Time 1154    PT Time Calculation (min) 39 min    Activity Tolerance Patient tolerated treatment well              Past Medical History:  Diagnosis Date   Allergy    Bilateral undescended testicles    Cancer (HCC)    melanoma on forehead   Cataract    OU   Diabetes mellitus without complication (HCC)    Dysrhythmia    a fib   GERD (gastroesophageal reflux disease)    Gout    Hepatitis    polycystic    Hypertension    Hypokalemia    Mixed hyperlipidemia    Polycystic kidney    Polycystic liver disease    Vitamin D deficiency    Past Surgical History:  Procedure Laterality Date   A/V FISTULAGRAM Left 12/15/2022   Procedure: A/V Fistulagram;  Surgeon: Renford Dills, MD;  Location: [facility];  Service: Cardiovascular;  Laterality: Left;   BASCILIC VEIN TRANSPOSITION Left 02/23/2020   Procedure: BASCILIC VEIN TRANSPOSITION (SINGLE STAGE);  Surgeon: Renford Dills, MD;  Location: [facility];  Service: Vascular;  Laterality: Left;   COLONOSCOPY  2012   hemorrhoids   melanoma removal     forehead   Patient Active Problem List   Diagnosis Date Noted   Right foot drop 01/05/2023   End stage renal disease (HCC) 06/04/2021   Anticoagulated 05/12/2021   Complication of vascular access for dialysis 06/06/2020   Fistula 02/23/2020   Polycystic liver disease 01/10/2020   Polycystic kidney disease 03/01/2019   Metabolic acidosis 09/13/2018   CKD stage 4 due to type 2 diabetes mellitus (HCC) 01/25/2018   Type 2 diabetes mellitus with diabetic nephropathy, without long-term current use of insulin (HCC) 01/25/2018   Secondary  hyperparathyroidism of renal origin (HCC) 08/19/2017   Chronic atrial fibrillation (HCC) 01/06/2017   Essential hypertension 01/06/2017   Hyperuricemia 01/06/2017   Mixed hyperlipidemia 01/06/2017   Gastroesophageal reflux disease 01/06/2017   Obesity 12/26/2012    PCP: Duanne Limerick, MD  REFERRING PROVIDER: Bridgette Habermann, PA-C  REFERRING DIAG: [phone] (ICD-10-CM) - Right foot drop   Rationale for Evaluation and Treatment: Rehabilitation  THERAPY DIAG:  Foot drop, right  Muscle weakness (generalized)  ONSET DATE: June, 2024  SUBJECTIVE:  SUBJECTIVE STATEMENT: Pt reports no pain in the RLE at today's session. States there has not been any significant changes in the RLE since the last session. Pt reports compliance w/ HEP.    PERTINENT HISTORY:  Patient presenting with foot drop which began 1.5 months ago. States this began suddenly. States that he went to bed and woke up with symptoms. Symptoms came on abruptly. No known trigger. No recent injuries. States over the past month and a half symptoms of gotten about "3% better". He is able to very slightly dorsiflex the right foot. States that he has adjusted his walking to compensate for this. Walking without assistance. No recent falls. He feels generally steady. Does report numbness and tingling in bilateral lower extremities. States that this began about 6 to 8 months ago after being diagnosed with peripheral neuropathy. This is limited to the feet. It is constant. Can be worse in the evenings. Pt denies radicular pain or sensory changes. Does have prior history of low back issues including herniated disc.  PAIN:  Are you having pain? Yes: NPRS scale: 0/10 Pain location: No pain, neuropathy related pain at dorsum of bilat feet 5-6/10. Pain  description: N/A Aggravating factors: N/A Relieving factors: Medications (paragaba)  PRECAUTIONS: None  RED FLAGS: Bowel or bladder incontinence: No and Spinal tumors: No   WEIGHT BEARING RESTRICTIONS: No  FALLS:  Has patient fallen in last 6 months? No and "stumbles"  OCCUPATION: Retired, Art gallery manager   PLOF: Independent  PATIENT GOALS: "Improve the foot drop"  NEXT MD VISIT: September, 2024 (Neurologist)   OBJECTIVE:   DIAGNOSTIC FINDINGS:  MRI of the brain w/o contrast, and lumbar spine scheduled for 03/04/2023   PATIENT SURVEYS:  FOTO 73/79  SCREENING FOR RED FLAGS: Bowel or bladder incontinence: No Spinal tumors: No Cauda equina syndrome: No  COGNITION: Overall cognitive status: Within functional limits for tasks assessed     SENSATION: UQNS: No sensation differences between R/L  LQNS: No sensation differences between R/L. Neuropathy of bilat feet noted 2/2 diabetes.   MUSCLE LENGTH: Gastroc-soleus complex tightness bilat. Not formally measured.   POSTURE: No Significant postural limitations  PALPATION: No TTP noted.    CERVICAL AROM: Cervical screen all AROM WNL grossly, no increased pain w/ movement. No indication to further test AROM.   LOWER EXTREMITY ROM:     Active  Right eval Left eval Right  03/02/23  Hip flexion     Hip extension     Hip abduction     Hip adduction     Hip internal rotation     Hip external rotation     Knee flexion     Knee extension     Ankle dorsiflexion 9 30 12   Ankle plantarflexion [facility] WFL   Ankle inversion [facility] WFL   Ankle eversion WFL WFL    (Blank rows = not tested)  LOWER EXTREMITY MMT:    MMT Right eval Left eval  Hip flexion 4+ 4  Hip extension    Hip abduction    Hip adduction    Hip internal rotation 4+ 4+  Hip external rotation 4+ 4+  Knee flexion 4+ 4+  Knee extension 4+ 4+  Ankle dorsiflexion 2 4+  Ankle plantarflexion 4+ 4+  Ankle inversion 4+ 4+  Ankle eversion 4+ 4+   (Blank rows =  not tested)  ANKLE JOINT MOBILITY: Mild hypomobility A/P noted w/ talocrural mobilization R >L, no concordant pain noted.   LUMBAR SPECIAL TESTS:  FABER test: Negative  FUNCTIONAL TESTS:    FGA: 22/30 (please see flowsheets)     SLS: RLE: 5.5 sec, LLE: 10 sec  TODAY'S TREATMENT:                                                                                                                              DATE: 03/11/23   Walking lvl 1.0 no incline, for gait assessment of R foot DF with ambulation x 2 minutes, Walking 300' over level ground to continue gait assessment. (See clinical impression for details).  Bilat gastroc stretch off of stair w/ bilat UE support 3 x 30sec   Seated BAPS board lvl 4 DF/PF 3 x36min  Seated BAPS board lvl 2 Inversion/Eversion 3 x79min  Long sitting RLE DF/ Inversion/ Eversion w/ GTB 1 x10 (stabilizing of RLE at knee to prevent other muscular compensation during Inversion/ Eversion)   Long sitting RLE DF/ Inversion/ Eversion w/ RTB 1 x10 (stabilizing of RLE at knee to prevent other muscular compensation during Inversion/ Eversion). Able to perform through greater ranges.   TRX Squats x10, multimodal cueing needed to facilitate exercise pt able to achieve CKC deep squat noting appropriate joint mobility in R ankle DF.   Airex pad:   Tandem stance RLE/ LLE forward 2 x30sec (increased difficulty with RLE posterior, noting moderate use of UE support). CGA.  Romberg stance RLE/ LLE forward 2 x30sec. CGA.   SLS RLE/ LLE 1 x15sec (Increased difficulty on RLE, needing moderate UE support for both). CGA.   SLS on ground 1 x15 sec each (Increased difficulty on RLE, needing moderate UE support for both). CGA.  PATIENT EDUCATION:  Education details: HEP Person educated: Patient Education method: Explanation, Facilities manager, and Handouts Education comprehension: verbalized understanding  HOME EXERCISE PROGRAM: Access Code: [email or web address] URL:  [email or web address] Date: 02/26/2023 Prepared by: Ronnie Derby  Exercises - Seated Ankle Circles  - 1 x daily - 7 x weekly - 2 sets - 15 reps - Gastroc Stretch with Foot at Wall  - 1 x daily - 7 x weekly - 1 sets - 3 reps - 30 hold - Long Sitting Calf Stretch with Strap  - 1 x daily - 7 x weekly - 3 sets - 10 reps - Seated Ankle Dorsiflexion AROM  - 1 x daily - 7 x weekly - 3 sets - 6-8 reps - Standing March with Counter Support  - 1 x daily - 7 x weekly - 3 sets - 8 reps  ASSESSMENT:  CLINICAL IMPRESSION:  Session focused on gait assessment and improving R foot/ankle strength and ROM. Pt noted difficulty ambulating on treadmill, so deferred to overground walking noting decreased step width, decreased RLE heel strike, and decreased R foot toe clearance w/ ambulation. Pt displays improvements in R foot/ankle DF ROM noted with exercise progression at today's session however, pt continues to note difficulty with R ankle inversion/ eversion showing compensation of proximal muscles to achieve movement. Pt's bilat ankle stability was also  assessed today, noting decreased proprioception and weakness in bilat ankles leading to regular ankle and hip righting reactions to correct balance. Pt would continue to benefit from skilled PT interventions to address remaining strength, ROM and balance deficits and return to PLOF.   OBJECTIVE IMPAIRMENTS: decreased mobility, decreased ROM, decreased strength, and hypomobility.   ACTIVITY LIMITATIONS: carrying heavy objects  PARTICIPATION LIMITATIONS: N/A  PERSONAL FACTORS: Age, Time since onset of injury/illness/exacerbation, and 3+ comorbidities: CKD, HTN, Type 2 diabetes, lymphedema  are also affecting patient's functional outcome.   REHAB POTENTIAL: Good  CLINICAL DECISION MAKING: Stable/uncomplicated  EVALUATION COMPLEXITY: Low   GOALS: Goals reviewed with patient? Yes  SHORT TERM GOALS: Target date: 03/25/2023  Pt will be IND  with HEP to increase R LE functional mobility in ankle DF.  Baseline: HEP given to pt 02/25/23 Goal status: INITIAL   LONG TERM GOALS: Target date: 04/08/2023  Pt will improve FOTO to target score to demonstrate clinically significant improvement in functional mobility. Baseline: 02/25/2023: 73/79 Goal status: INITIAL  2.  Pt will improve FGA score by 4 points to display clinically significant improvement in balance.  Baseline: 02/25/2023: 22/30  Goal status: INITIAL  3.  Pt will improve R LE DF AROM to 30 deg (equal to L LE DF) to enhance toe clearance ability and reduce risk of falls.   Baseline: 02/25/2023: 9 deg. Goal status: INITIAL  PLAN:  PT FREQUENCY: 1x/week  PT DURATION: 6 weeks  PLANNED INTERVENTIONS: Therapeutic exercises, Therapeutic activity, Neuromuscular re-education, Balance training, Gait training, Patient/Family education, Self Care, Joint mobilization, Joint manipulation, Stair training, Dry Needling, Electrical stimulation, Spinal manipulation, Spinal mobilization, Cryotherapy, and Moist heat.  PLAN FOR NEXT SESSION: Progress exercises to improve R LE DF AROM and MMT, incorporate balance and proprioception exercises. Update HEP.   Lovie Macadamia, SPT   Delphia Grates. Fairly IV, PT, DPT Physical Therapist- [place]  [facility] 03/11/2023, 2:07 PM

## 2023-03-12 DIAGNOSIS — Z992 Dependence on renal dialysis: Secondary | ICD-10-CM | POA: Diagnosis not present

## 2023-03-12 DIAGNOSIS — Z23 Encounter for immunization: Secondary | ICD-10-CM | POA: Diagnosis not present

## 2023-03-12 DIAGNOSIS — N2581 Secondary hyperparathyroidism of renal origin: Secondary | ICD-10-CM | POA: Diagnosis not present

## 2023-03-12 DIAGNOSIS — D509 Iron deficiency anemia, unspecified: Secondary | ICD-10-CM | POA: Diagnosis not present

## 2023-03-12 DIAGNOSIS — D631 Anemia in chronic kidney disease: Secondary | ICD-10-CM | POA: Diagnosis not present

## 2023-03-12 DIAGNOSIS — N186 End stage renal disease: Secondary | ICD-10-CM | POA: Diagnosis not present

## 2023-03-15 DIAGNOSIS — D631 Anemia in chronic kidney disease: Secondary | ICD-10-CM | POA: Diagnosis not present

## 2023-03-15 DIAGNOSIS — N186 End stage renal disease: Secondary | ICD-10-CM | POA: Diagnosis not present

## 2023-03-15 DIAGNOSIS — Z23 Encounter for immunization: Secondary | ICD-10-CM | POA: Diagnosis not present

## 2023-03-15 DIAGNOSIS — D509 Iron deficiency anemia, unspecified: Secondary | ICD-10-CM | POA: Diagnosis not present

## 2023-03-15 DIAGNOSIS — Z992 Dependence on renal dialysis: Secondary | ICD-10-CM | POA: Diagnosis not present

## 2023-03-15 DIAGNOSIS — N2581 Secondary hyperparathyroidism of renal origin: Secondary | ICD-10-CM | POA: Diagnosis not present

## 2023-03-16 DIAGNOSIS — R200 Anesthesia of skin: Secondary | ICD-10-CM | POA: Diagnosis not present

## 2023-03-16 DIAGNOSIS — R202 Paresthesia of skin: Secondary | ICD-10-CM | POA: Diagnosis not present

## 2023-03-17 DIAGNOSIS — D509 Iron deficiency anemia, unspecified: Secondary | ICD-10-CM | POA: Diagnosis not present

## 2023-03-17 DIAGNOSIS — Z992 Dependence on renal dialysis: Secondary | ICD-10-CM | POA: Diagnosis not present

## 2023-03-17 DIAGNOSIS — N186 End stage renal disease: Secondary | ICD-10-CM | POA: Diagnosis not present

## 2023-03-17 DIAGNOSIS — D631 Anemia in chronic kidney disease: Secondary | ICD-10-CM | POA: Diagnosis not present

## 2023-03-17 DIAGNOSIS — Z23 Encounter for immunization: Secondary | ICD-10-CM | POA: Diagnosis not present

## 2023-03-17 DIAGNOSIS — N2581 Secondary hyperparathyroidism of renal origin: Secondary | ICD-10-CM | POA: Diagnosis not present

## 2023-03-18 ENCOUNTER — Ambulatory Visit: Payer: Medicare Other

## 2023-03-18 DIAGNOSIS — M21371 Foot drop, right foot: Secondary | ICD-10-CM | POA: Diagnosis not present

## 2023-03-18 DIAGNOSIS — M6281 Muscle weakness (generalized): Secondary | ICD-10-CM

## 2023-03-18 NOTE — Therapy (Addendum)
OUTPATIENT PHYSICAL THERAPY THORACOLUMBAR TREATMENT   Patient Name: Mark Bauer MRN: [id number] DOB:[birth year], 67 y.o., male Today's Date: 03/18/2023  END OF SESSION:  PT End of Session - 03/18/23 1025     Visit Number 4    Number of Visits 7    Date for PT Re-Evaluation 04/08/23    PT Start Time 1029    PT Stop Time 1110    PT Time Calculation (min) 41 min    Activity Tolerance Patient tolerated treatment well              Past Medical History:  Diagnosis Date   Allergy    Bilateral undescended testicles    Cancer (HCC)    melanoma on forehead   Cataract    OU   Diabetes mellitus without complication (HCC)    Dysrhythmia    a fib   GERD (gastroesophageal reflux disease)    Gout    Hepatitis    polycystic    Hypertension    Hypokalemia    Mixed hyperlipidemia    Polycystic kidney    Polycystic liver disease    Vitamin D deficiency    Past Surgical History:  Procedure Laterality Date   A/V FISTULAGRAM Left 12/15/2022   Procedure: A/V Fistulagram;  Surgeon: Renford Dills, MD;  Location: [facility];  Service: Cardiovascular;  Laterality: Left;   BASCILIC VEIN TRANSPOSITION Left 02/23/2020   Procedure: BASCILIC VEIN TRANSPOSITION (SINGLE STAGE);  Surgeon: Renford Dills, MD;  Location: [facility];  Service: Vascular;  Laterality: Left;   COLONOSCOPY  2012   hemorrhoids   melanoma removal     forehead   Patient Active Problem List   Diagnosis Date Noted   Right foot drop 01/05/2023   End stage renal disease (HCC) 06/04/2021   Anticoagulated 05/12/2021   Complication of vascular access for dialysis 06/06/2020   Fistula 02/23/2020   Polycystic liver disease 01/10/2020   Polycystic kidney disease 03/01/2019   Metabolic acidosis 09/13/2018   CKD stage 4 due to type 2 diabetes mellitus (HCC) 01/25/2018   Type 2 diabetes mellitus with diabetic nephropathy, without long-term current use of insulin (HCC) 01/25/2018   Secondary  hyperparathyroidism of renal origin (HCC) 08/19/2017   Chronic atrial fibrillation (HCC) 01/06/2017   Essential hypertension 01/06/2017   Hyperuricemia 01/06/2017   Mixed hyperlipidemia 01/06/2017   Gastroesophageal reflux disease 01/06/2017   Obesity 12/26/2012    PCP: Duanne Limerick, MD  REFERRING PROVIDER: Bridgette Habermann, PA-C  REFERRING DIAG: [phone] (ICD-10-CM) - Right foot drop   Rationale for Evaluation and Treatment: Rehabilitation  THERAPY DIAG:  Foot drop, right  Muscle weakness (generalized)  ONSET DATE: June, 2024  SUBJECTIVE:  SUBJECTIVE STATEMENT: Pt reports no pain in the RLE at today's session. Pt brought in printed copy of MRI results of the lumbar spine, stating that he has had a herniated disc for 15 years with no associated pain. No notable changes over the weekend.   PERTINENT HISTORY:  Patient presenting with foot drop which began 1.5 months ago. States this began suddenly. States that he went to bed and woke up with symptoms. Symptoms came on abruptly. No known trigger. No recent injuries. States over the past month and a half symptoms of gotten about "3% better". He is able to very slightly dorsiflex the right foot. States that he has adjusted his walking to compensate for this. Walking without assistance. No recent falls. He feels generally steady. Does report numbness and tingling in bilateral lower extremities. States that this began about 6 to 8 months ago after being diagnosed with peripheral neuropathy. This is limited to the feet. It is constant. Can be worse in the evenings. Pt denies radicular pain or sensory changes. Does have prior history of low back issues including herniated disc.  PAIN:  Are you having pain? Yes: NPRS scale: 0/10 Pain location: No pain,  neuropathy related pain at dorsum of bilat feet 5-6/10. Pain description: N/A Aggravating factors: N/A Relieving factors: Medications (paragaba)  PRECAUTIONS: None  RED FLAGS: Bowel or bladder incontinence: No and Spinal tumors: No   WEIGHT BEARING RESTRICTIONS: No  FALLS:  Has patient fallen in last 6 months? No and "stumbles"  OCCUPATION: Retired, Art gallery manager   PLOF: Independent  PATIENT GOALS: "Improve the foot drop"  NEXT MD VISIT: September, 2024 (Neurologist)   OBJECTIVE:   DIAGNOSTIC FINDINGS:  MRI of the brain w/o contrast, and lumbar spine scheduled for 03/04/2023   PATIENT SURVEYS:  FOTO 73/79  SCREENING FOR RED FLAGS: Bowel or bladder incontinence: No Spinal tumors: No Cauda equina syndrome: No  COGNITION: Overall cognitive status: Within functional limits for tasks assessed     SENSATION: UQNS: No sensation differences between R/L  LQNS: No sensation differences between R/L. Neuropathy of bilat feet noted 2/2 diabetes.   MUSCLE LENGTH: Gastroc-soleus complex tightness bilat. Not formally measured.   POSTURE: No Significant postural limitations  PALPATION: No TTP noted.    CERVICAL AROM: Cervical screen all AROM WNL grossly, no increased pain w/ movement. No indication to further test AROM.   LOWER EXTREMITY ROM:     Active  Right eval Left eval Right  03/02/23  Hip flexion     Hip extension     Hip abduction     Hip adduction     Hip internal rotation     Hip external rotation     Knee flexion     Knee extension     Ankle dorsiflexion 9 30 12   Ankle plantarflexion [facility] WFL   Ankle inversion [facility] WFL   Ankle eversion WFL WFL    (Blank rows = not tested)  LOWER EXTREMITY MMT:    MMT Right eval Left eval  Hip flexion 4+ 4  Hip extension    Hip abduction    Hip adduction    Hip internal rotation 4+ 4+  Hip external rotation 4+ 4+  Knee flexion 4+ 4+  Knee extension 4+ 4+  Ankle dorsiflexion 2 4+  Ankle plantarflexion 4+ 4+   Ankle inversion 4+ 4+  Ankle eversion 4+ 4+   (Blank rows = not tested)  ANKLE JOINT MOBILITY: Mild hypomobility A/P noted w/ talocrural mobilization R >L, no concordant  pain noted.   LUMBAR SPECIAL TESTS:  FABER test: Negative  FUNCTIONAL TESTS:    FGA: 22/30 (please see flowsheets)     SLS: RLE: 5.5 sec, LLE: 10 sec  TODAY'S TREATMENT:                                                                                                                              DATE: 03/18/23  Walking 400' over level ground to facilitate gait assessment. (See clinical impression for details).  Bilat gastroc stretch off of stair w/ bilat UE support 3 x 30sec   Bilat soleus stretch off of stair w/ bilat UE support 2 x30 sec  Long sitting RLE DF/ Inversion/ Eversion w/ GTB 2 x12 (stabilizing of RLE at knee to prevent other muscular compensation during Inversion/ Eversion)   Seated BAPS board lvl 4 DF/PF 3 x46min  Standing heel to toe raises w/ bilat UE support 2 x10, v/c needed to facilitate movement, good carryover  TRX Squats 3x12, v/c needed to facilitate movement, good carryover  Standing lunges RLE/LLE w/ single UE support x10; multimodal cueing needed to facilitate exercise, mild carryover   Upside down BOSU ball PF/DF of bilat LE's w/ bilat UE support 3 x30sec; CGA  Upside down BOSU ball Eversion/Inversion of bilat LE's w/ bilat UE support 3 x30sec; CGA  Upside down BOSU ball static standing w/ bilat UE support x30sec; CGA    PATIENT EDUCATION:  Education details: HEP Person educated: Patient Education method: Explanation, Demonstration, and Handouts Education comprehension: verbalized understanding  HOME EXERCISE PROGRAM: Access Code: [email or web address] URL: [email or web address] Date: 02/26/2023 Prepared by: Ronnie Derby  Exercises - Seated Ankle Circles  - 1 x daily - 7 x weekly - 2 sets - 15 reps - Gastroc Stretch with Foot at Wall  - 1 x daily - 7 x weekly - 1 sets  - 3 reps - 30 hold - Long Sitting Calf Stretch with Strap  - 1 x daily - 7 x weekly - 3 sets - 10 reps - Seated Ankle Dorsiflexion AROM  - 1 x daily - 7 x weekly - 3 sets - 6-8 reps - Standing March with Counter Support  - 1 x daily - 7 x weekly - 3 sets - 8 reps  ASSESSMENT:  CLINICAL IMPRESSION:  Session focused on gait assessment and improving R foot/ankle strength and ROM. Pt noted improvements in RLE step length, appropriate increase in cadence, and improved R foot toe clearance. Pt continues to display improvement in R foot/ ankle DF ROM and balance noted w/ increased activity tolerance of RLE strength/ ROM exercises along w/ balance activities including the BOSU ball at today's session. Though pt is making progression in R foot/ankle strength he continues to display decreased DF ROM, and would continue to benefit from skilled PT interventions to address remaining deficits.  OBJECTIVE IMPAIRMENTS: decreased mobility, decreased ROM, decreased strength, and hypomobility.   ACTIVITY LIMITATIONS: carrying heavy objects  PARTICIPATION LIMITATIONS:  N/A  PERSONAL FACTORS: Age, Time since onset of injury/illness/exacerbation, and 3+ comorbidities: CKD, HTN, Type 2 diabetes, lymphedema  are also affecting patient's functional outcome.   REHAB POTENTIAL: Good  CLINICAL DECISION MAKING: Stable/uncomplicated  EVALUATION COMPLEXITY: Low   GOALS: Goals reviewed with patient? Yes  SHORT TERM GOALS: Target date: 03/25/2023  Pt will be IND with HEP to increase R LE functional mobility in ankle DF.  Baseline: HEP given to pt 02/25/23 Goal status: INITIAL   LONG TERM GOALS: Target date: 04/08/2023  Pt will improve FOTO to target score to demonstrate clinically significant improvement in functional mobility. Baseline: 02/25/2023: 73/79 Goal status: INITIAL  2.  Pt will improve FGA score by 4 points to display clinically significant improvement in balance.  Baseline: 02/25/2023: 22/30  Goal  status: INITIAL  3.  Pt will improve R LE DF AROM to 30 deg (equal to L LE DF) to enhance toe clearance ability and reduce risk of falls.   Baseline: 02/25/2023: 9 deg. Goal status: INITIAL  PLAN:  PT FREQUENCY: 1x/week  PT DURATION: 6 weeks  PLANNED INTERVENTIONS: Therapeutic exercises, Therapeutic activity, Neuromuscular re-education, Balance training, Gait training, Patient/Family education, Self Care, Joint mobilization, Joint manipulation, Stair training, Dry Needling, Electrical stimulation, Spinal manipulation, Spinal mobilization, Cryotherapy, and Moist heat.  PLAN FOR NEXT SESSION: Assess tolerance to updates to pt's HEP. Progress exercises to improve R LE DF AROM and MMT, incorporate balance and proprioception exercises.   Lovie Macadamia, SPT   Delphia Grates. Fairly IV, PT, DPT Physical Therapist- [place]  [facility] 03/18/2023, 2:02 PM

## 2023-03-19 DIAGNOSIS — N186 End stage renal disease: Secondary | ICD-10-CM | POA: Diagnosis not present

## 2023-03-19 DIAGNOSIS — Z992 Dependence on renal dialysis: Secondary | ICD-10-CM | POA: Diagnosis not present

## 2023-03-19 DIAGNOSIS — D509 Iron deficiency anemia, unspecified: Secondary | ICD-10-CM | POA: Diagnosis not present

## 2023-03-19 DIAGNOSIS — N2581 Secondary hyperparathyroidism of renal origin: Secondary | ICD-10-CM | POA: Diagnosis not present

## 2023-03-19 DIAGNOSIS — D631 Anemia in chronic kidney disease: Secondary | ICD-10-CM | POA: Diagnosis not present

## 2023-03-19 DIAGNOSIS — Z23 Encounter for immunization: Secondary | ICD-10-CM | POA: Diagnosis not present

## 2023-03-20 DIAGNOSIS — N186 End stage renal disease: Secondary | ICD-10-CM | POA: Diagnosis not present

## 2023-03-20 DIAGNOSIS — Q612 Polycystic kidney, adult type: Secondary | ICD-10-CM | POA: Diagnosis not present

## 2023-03-20 DIAGNOSIS — Z992 Dependence on renal dialysis: Secondary | ICD-10-CM | POA: Diagnosis not present

## 2023-03-22 DIAGNOSIS — Z992 Dependence on renal dialysis: Secondary | ICD-10-CM | POA: Diagnosis not present

## 2023-03-22 DIAGNOSIS — D631 Anemia in chronic kidney disease: Secondary | ICD-10-CM | POA: Diagnosis not present

## 2023-03-22 DIAGNOSIS — N186 End stage renal disease: Secondary | ICD-10-CM | POA: Diagnosis not present

## 2023-03-22 DIAGNOSIS — N2581 Secondary hyperparathyroidism of renal origin: Secondary | ICD-10-CM | POA: Diagnosis not present

## 2023-03-23 ENCOUNTER — Ambulatory Visit: Payer: Medicare Other | Attending: Physician Assistant

## 2023-03-23 DIAGNOSIS — M21371 Foot drop, right foot: Secondary | ICD-10-CM | POA: Diagnosis not present

## 2023-03-23 DIAGNOSIS — M6281 Muscle weakness (generalized): Secondary | ICD-10-CM | POA: Insufficient documentation

## 2023-03-23 NOTE — Therapy (Addendum)
OUTPATIENT PHYSICAL THERAPY THORACOLUMBAR TREATMENT   Patient Name: Mark Bauer MRN: [id number] DOB:1956-05-05, 67 y.o., male Today's Date: 03/23/2023  END OF SESSION:  PT End of Session - 03/23/23 0852     Visit Number 5    Number of Visits 7    Date for PT Re-Evaluation 04/08/23    PT Start Time 0857    PT Stop Time 0946    PT Time Calculation (min) 49 min    Activity Tolerance Patient tolerated treatment well              Past Medical History:  Diagnosis Date   Allergy    Bilateral undescended testicles    Cancer (HCC)    melanoma on forehead   Cataract    OU   Diabetes mellitus without complication (HCC)    Dysrhythmia    a fib   GERD (gastroesophageal reflux disease)    Gout    Hepatitis    polycystic    Hypertension    Hypokalemia    Mixed hyperlipidemia    Polycystic kidney    Polycystic liver disease    Vitamin D deficiency    Past Surgical History:  Procedure Laterality Date   A/V FISTULAGRAM Left 12/15/2022   Procedure: A/V Fistulagram;  Surgeon: Renford Dills, MD;  Location: [facility];  Service: Cardiovascular;  Laterality: Left;   BASCILIC VEIN TRANSPOSITION Left 02/23/2020   Procedure: BASCILIC VEIN TRANSPOSITION (SINGLE STAGE);  Surgeon: Renford Dills, MD;  Location: [facility];  Service: Vascular;  Laterality: Left;   COLONOSCOPY  2012   hemorrhoids   melanoma removal     forehead   Patient Active Problem List   Diagnosis Date Noted   Right foot drop 01/05/2023   End stage renal disease (HCC) 06/04/2021   Anticoagulated 05/12/2021   Complication of vascular access for dialysis 06/06/2020   Fistula 02/23/2020   Polycystic liver disease 01/10/2020   Polycystic kidney disease 03/01/2019   Metabolic acidosis 09/13/2018   CKD stage 4 due to type 2 diabetes mellitus (HCC) 01/25/2018   Type 2 diabetes mellitus with diabetic nephropathy, without long-term current use of insulin (HCC) 01/25/2018   Secondary  hyperparathyroidism of renal origin (HCC) 08/19/2017   Chronic atrial fibrillation (HCC) 01/06/2017   Essential hypertension 01/06/2017   Hyperuricemia 01/06/2017   Mixed hyperlipidemia 01/06/2017   Gastroesophageal reflux disease 01/06/2017   Obesity 12/26/2012    PCP: Duanne Limerick, MD  REFERRING PROVIDER: Bridgette Habermann, PA-C  REFERRING DIAG: [phone] (ICD-10-CM) - Right foot drop   Rationale for Evaluation and Treatment: Rehabilitation  THERAPY DIAG:  Foot drop, right  Muscle weakness (generalized)  ONSET DATE: June, 2024  SUBJECTIVE:  SUBJECTIVE STATEMENT: Pt reports no pain in the RLE at today's session. Pt reports no notable changes over the weekend.   PERTINENT HISTORY:  Patient presenting with foot drop which began 1.5 months ago. States this began suddenly. States that he went to bed and woke up with symptoms. Symptoms came on abruptly. No known trigger. No recent injuries. S tates over the past month and a half symptoms of gotten about "3% better". He is able to very slightly dorsiflex the right foot. States that he has adjusted his walking to compensate for this. Walking without assistance. No recent falls. He feels generally steady. Does report numbness and tingling in bilateral lower extremities. States that this began about 6 to 8 months ago after being diagnosed with peripheral neuropathy. This is limited to the feet. It is constant. Can be worse in the evenings. Pt denies radicular pain or sensory changes. Does have prior history of low back issues including herniated disc.  PAIN:  Are you having pain? Yes: NPRS scale: 0/10 Pain location: No pain, neuropathy related pain at dorsum of bilat feet 5-6/10. Pain description: N/A Aggravating factors: N/A Relieving factors: Medications  (paragaba)  PRECAUTIONS: None  RED FLAGS: Bowel or bladder incontinence: No and Spinal tumors: No   WEIGHT BEARING RESTRICTIONS: No  FALLS:  Has patient fallen in last 6 months? No and "stumbles"  OCCUPATION: Retired, Art gallery manager   PLOF: Independent  PATIENT GOALS: "Improve the foot drop"  NEXT MD VISIT: September, 2024 (Neurologist)   OBJECTIVE:   DIAGNOSTIC FINDINGS:  MRI of the brain w/o contrast, and lumbar spine scheduled for 03/04/2023   PATIENT SURVEYS:  FOTO 73/79  SCREENING FOR RED FLAGS: Bowel or bladder incontinence: No Spinal tumors: No Cauda equina syndrome: No  COGNITION: Overall cognitive status: Within functional limits for tasks assessed     SENSATION: UQNS: No sensation differences between R/L  LQNS: No sensation differences between R/L. Neuropathy of bilat feet noted 2/2 diabetes.   MUSCLE LENGTH: Gastroc-soleus complex tightness bilat. Not formally measured.   POSTURE: No Significant postural limitations  PALPATION: No TTP noted.    CERVICAL AROM: Cervical screen all AROM WNL grossly, no increased pain w/ movement. No indication to further test AROM.   LOWER EXTREMITY ROM:     Active  Right eval Left eval Right  03/02/23  Hip flexion     Hip extension     Hip abduction     Hip adduction     Hip internal rotation     Hip external rotation     Knee flexion     Knee extension     Ankle dorsiflexion 9 30 12   Ankle plantarflexion [facility] WFL   Ankle inversion [facility] WFL   Ankle eversion WFL WFL    (Blank rows = not tested)  LOWER EXTREMITY MMT:    MMT Right eval Left eval  Hip flexion 4+ 4  Hip extension    Hip abduction    Hip adduction    Hip internal rotation 4+ 4+  Hip external rotation 4+ 4+  Knee flexion 4+ 4+  Knee extension 4+ 4+  Ankle dorsiflexion 2 4+  Ankle plantarflexion 4+ 4+  Ankle inversion 4+ 4+  Ankle eversion 4+ 4+   (Blank rows = not tested)  ANKLE JOINT MOBILITY: Mild hypomobility A/P noted w/  talocrural mobilization R >L, no concordant pain noted.   LUMBAR SPECIAL TESTS:  FABER test: Negative  FUNCTIONAL TESTS:    FGA: 22/30 (please see flowsheets)  SLS: RLE: 5.5 sec, LLE: 10 sec  TODAY'S TREATMENT:                                                                                                                              DATE: 03/23/23  Ambulating over uneven surfaces including ascending/descending ramps x3, 8" stair negotiation x3, uphill/ downhill ambulation through mulch x3 (see clinical impression for details.) SBA to CGA.    Bilat gastroc stretch off of stair w/ bilat UE support 3 x 30sec   Standing heel to toe raises w/ bilat UE support 2 x10, v/c needed to facilitate movement, good carryover  Standing heel walks 3 x15' down and back, CGA   Airex balance beam 3 x10' down and back, tandem walking, CGA  Airex balance beam 3 x10' down and back, lateral stepping, CGA  Clinical Test of Sensory Interaction for Balance (CTSIB): (see clinical impression for details)   CONDITION TIME STRATEGY SWAY  Eyes open, firm surface 30 seconds ankle No sway noted  Eyes closed, firm surface 30 seconds ankle Mild anterior sway  Eyes open, foam surface 30 seconds ankle No sway noted  Eyes closed, foam surface 30 seconds hip      Moderate anterior sway     Single leg, leg press heel raise RLE 2 x12 20#, multimodal cueing needed to facilitate exercise, good carryover  Bilat LE leg press 3 x10 45#, pt utilizing bilat UE 's to assist LE's to initiate push- off.    PATIENT EDUCATION:  Education details: HEP Person educated: Patient Education method: Explanation, Facilities manager, and Handouts Education comprehension: verbalized understanding  HOME EXERCISE PROGRAM: Access Code: [email or web address] URL: [email or web address] Date: 02/26/2023 Prepared by: Ronnie Derby  Exercises - Seated Ankle Circles  - 1 x daily - 7 x weekly - 2 sets - 15 reps - Gastroc Stretch with  Foot at Wall  - 1 x daily - 7 x weekly - 1 sets - 3 reps - 30 hold - Long Sitting Calf Stretch with Strap  - 1 x daily - 7 x weekly - 3 sets - 10 reps - Seated Ankle Dorsiflexion AROM  - 1 x daily - 7 x weekly - 3 sets - 6-8 reps - Standing March with Counter Support  - 1 x daily - 7 x weekly - 3 sets - 8 reps  ASSESSMENT:  CLINICAL IMPRESSION:  Session focused on functional balance testing via ambulation over uneven surfaces and completing the modified- CTSIB, along with improving R foot/ankle strength and ROM. Pt has notable compensatory strategies to assist in ambulating over uneven ground, noting the most difficulty w/ ascending ramps requiring pt to have increased R knee flexion, to compensate for limited R DF ROM. Upon pt's modified- CTSIB assessment pt relies most significantly on visual input for balance and notes the most deficits w/ his vestibular system as indicated with hip righting reactions. This could note difficulty completing functional vestibular related tasks such as driving a car,  and navigating crowded environments, and outdoor gait on unlevel surfaces. Pt notes improvements w/ R foot/ ankle strength and ROM noted w/ increased activity tolerance to exercise at today's session. Pt would continue to benefit from skilled PT interventions to address remaining R foot/ ankle strength, ROM and balance deficits to improve QoL and return to PLOF.    OBJECTIVE IMPAIRMENTS: decreased mobility, decreased ROM, decreased strength, and hypomobility.   ACTIVITY LIMITATIONS: carrying heavy objects  PARTICIPATION LIMITATIONS: N/A  PERSONAL FACTORS: Age, Time since onset of injury/illness/exacerbation, and 3+ comorbidities: CKD, HTN, Type 2 diabetes, lymphedema  are also affecting patient's functional outcome.   REHAB POTENTIAL: Good  CLINICAL DECISION MAKING: Stable/uncomplicated  EVALUATION COMPLEXITY: Low   GOALS: Goals reviewed with patient? Yes  SHORT TERM GOALS: Target date:  03/25/2023  Pt will be IND with HEP to increase R LE functional mobility in ankle DF.  Baseline: HEP given to pt 02/25/23 Goal status: INITIAL   LONG TERM GOALS: Target date: 04/08/2023  Pt will improve FOTO to target score to demonstrate clinically significant improvement in functional mobility. Baseline: 02/25/2023: 73/79 Goal status: INITIAL  2.  Pt will improve FGA score by 4 points to display clinically significant improvement in balance.  Baseline: 02/25/2023: 22/30  Goal status: INITIAL  3.  Pt will improve R LE DF AROM to 30 deg (equal to L LE DF) to enhance toe clearance ability and reduce risk of falls.   Baseline: 02/25/2023: 9 deg. Goal status: INITIAL  PLAN:  PT FREQUENCY: 1x/week  PT DURATION: 6 weeks  PLANNED INTERVENTIONS: Therapeutic exercises, Therapeutic activity, Neuromuscular re-education, Balance training, Gait training, Patient/Family education, Self Care, Joint mobilization, Joint manipulation, Stair training, Dry Needling, Electrical stimulation, Spinal manipulation, Spinal mobilization, Cryotherapy, and Moist heat.  PLAN FOR NEXT SESSION: Progress exercises to improve R LE DF AROM and strength, vestibular focused balance exercises  Lovie Macadamia, SPT   Delphia Grates. Fairly IV, PT, DPT Physical Therapist- [place]  [facility] 03/23/2023, 10:12 AM

## 2023-03-24 DIAGNOSIS — N2581 Secondary hyperparathyroidism of renal origin: Secondary | ICD-10-CM | POA: Diagnosis not present

## 2023-03-24 DIAGNOSIS — D631 Anemia in chronic kidney disease: Secondary | ICD-10-CM | POA: Diagnosis not present

## 2023-03-24 DIAGNOSIS — Z992 Dependence on renal dialysis: Secondary | ICD-10-CM | POA: Diagnosis not present

## 2023-03-24 DIAGNOSIS — N186 End stage renal disease: Secondary | ICD-10-CM | POA: Diagnosis not present

## 2023-03-26 DIAGNOSIS — Z992 Dependence on renal dialysis: Secondary | ICD-10-CM | POA: Diagnosis not present

## 2023-03-26 DIAGNOSIS — N2581 Secondary hyperparathyroidism of renal origin: Secondary | ICD-10-CM | POA: Diagnosis not present

## 2023-03-26 DIAGNOSIS — N186 End stage renal disease: Secondary | ICD-10-CM | POA: Diagnosis not present

## 2023-03-26 DIAGNOSIS — D631 Anemia in chronic kidney disease: Secondary | ICD-10-CM | POA: Diagnosis not present

## 2023-03-29 DIAGNOSIS — N186 End stage renal disease: Secondary | ICD-10-CM | POA: Diagnosis not present

## 2023-03-29 DIAGNOSIS — Z992 Dependence on renal dialysis: Secondary | ICD-10-CM | POA: Diagnosis not present

## 2023-03-29 DIAGNOSIS — D631 Anemia in chronic kidney disease: Secondary | ICD-10-CM | POA: Diagnosis not present

## 2023-03-29 DIAGNOSIS — N2581 Secondary hyperparathyroidism of renal origin: Secondary | ICD-10-CM | POA: Diagnosis not present

## 2023-03-30 DIAGNOSIS — R202 Paresthesia of skin: Secondary | ICD-10-CM | POA: Diagnosis not present

## 2023-03-30 DIAGNOSIS — M21371 Foot drop, right foot: Secondary | ICD-10-CM | POA: Diagnosis not present

## 2023-03-30 DIAGNOSIS — R200 Anesthesia of skin: Secondary | ICD-10-CM | POA: Diagnosis not present

## 2023-03-30 DIAGNOSIS — G629 Polyneuropathy, unspecified: Secondary | ICD-10-CM | POA: Diagnosis not present

## 2023-03-30 DIAGNOSIS — R2689 Other abnormalities of gait and mobility: Secondary | ICD-10-CM | POA: Diagnosis not present

## 2023-03-31 DIAGNOSIS — Z992 Dependence on renal dialysis: Secondary | ICD-10-CM | POA: Diagnosis not present

## 2023-03-31 DIAGNOSIS — D631 Anemia in chronic kidney disease: Secondary | ICD-10-CM | POA: Diagnosis not present

## 2023-03-31 DIAGNOSIS — N2581 Secondary hyperparathyroidism of renal origin: Secondary | ICD-10-CM | POA: Diagnosis not present

## 2023-03-31 DIAGNOSIS — N186 End stage renal disease: Secondary | ICD-10-CM | POA: Diagnosis not present

## 2023-04-01 ENCOUNTER — Ambulatory Visit: Payer: Medicare Other

## 2023-04-01 DIAGNOSIS — M6281 Muscle weakness (generalized): Secondary | ICD-10-CM | POA: Diagnosis not present

## 2023-04-01 DIAGNOSIS — M21371 Foot drop, right foot: Secondary | ICD-10-CM | POA: Diagnosis not present

## 2023-04-01 NOTE — Therapy (Addendum)
OUTPATIENT PHYSICAL THERAPY THORACOLUMBAR TREATMENT   Patient Name: Mark Bauer MRN: [id number] DOB:01-16-1956, 67 y.o., male Today's Date: 04/01/2023  END OF SESSION:  PT End of Session - 04/01/23 1421     Visit Number 6    Number of Visits 7    Date for PT Re-Evaluation 04/08/23    PT Start Time 1426    PT Stop Time 1506    PT Time Calculation (min) 40 min    Activity Tolerance Patient tolerated treatment well              Past Medical History:  Diagnosis Date   Allergy    Bilateral undescended testicles    Cancer (HCC)    melanoma on forehead   Cataract    OU   Diabetes mellitus without complication (HCC)    Dysrhythmia    a fib   GERD (gastroesophageal reflux disease)    Gout    Hepatitis    polycystic    Hypertension    Hypokalemia    Mixed hyperlipidemia    Polycystic kidney    Polycystic liver disease    Vitamin D deficiency    Past Surgical History:  Procedure Laterality Date   A/V FISTULAGRAM Left 12/15/2022   Procedure: A/V Fistulagram;  Surgeon: Renford Dills, MD;  Location: [facility];  Service: Cardiovascular;  Laterality: Left;   BASCILIC VEIN TRANSPOSITION Left 02/23/2020   Procedure: BASCILIC VEIN TRANSPOSITION (SINGLE STAGE);  Surgeon: Renford Dills, MD;  Location: [facility];  Service: Vascular;  Laterality: Left;   COLONOSCOPY  2012   hemorrhoids   melanoma removal     forehead   Patient Active Problem List   Diagnosis Date Noted   Right foot drop 01/05/2023   End stage renal disease (HCC) 06/04/2021   Anticoagulated 05/12/2021   Complication of vascular access for dialysis 06/06/2020   Fistula 02/23/2020   Polycystic liver disease 01/10/2020   Polycystic kidney disease 03/01/2019   Metabolic acidosis 09/13/2018   CKD stage 4 due to type 2 diabetes mellitus (HCC) 01/25/2018   Type 2 diabetes mellitus with diabetic nephropathy, without long-term current use of insulin (HCC) 01/25/2018   Secondary  hyperparathyroidism of renal origin (HCC) 08/19/2017   Chronic atrial fibrillation (HCC) 01/06/2017   Essential hypertension 01/06/2017   Hyperuricemia 01/06/2017   Mixed hyperlipidemia 01/06/2017   Gastroesophageal reflux disease 01/06/2017   Obesity 12/26/2012    PCP: Duanne Limerick, MD  REFERRING PROVIDER: Bridgette Habermann, PA-C  REFERRING DIAG: [phone] (ICD-10-CM) - Right foot drop   Rationale for Evaluation and Treatment: Rehabilitation  THERAPY DIAG:  Foot drop, right  Muscle weakness (generalized)  ONSET DATE: June, 2024  SUBJECTIVE:  SUBJECTIVE STATEMENT: Pt reports no pain in the RLE at today's session. Pt reports difficulty w/ some of the balance exercises on HEP. Pt reports no notable changes since the last session.   PERTINENT HISTORY:  Patient presenting with foot drop which began 1.5 months ago. States this began suddenly. States that he went to bed and woke up with symptoms. Symptoms came on abruptly. No known trigger. No recent injuries. S tates over the past month and a half symptoms of gotten about "3% better". He is able to very slightly dorsiflex the right foot. States that he has adjusted his walking to compensate for this. Walking without assistance. No recent falls. He feels generally steady. Does report numbness and tingling in bilateral lower extremities. States that this began about 6 to 8 months ago after being diagnosed with peripheral neuropathy. This is limited to the feet. It is constant. Can be worse in the evenings. Pt denies radicular pain or sensory changes. Does have prior history of low back issues including herniated disc.  PAIN:  Are you having pain? Yes: NPRS scale: 0/10 Pain location: No pain, neuropathy related pain at dorsum of bilat feet 5-6/10. Pain  description: N/A Aggravating factors: N/A Relieving factors: Medications (paragaba)  PRECAUTIONS: None  RED FLAGS: Bowel or bladder incontinence: No and Spinal tumors: No   WEIGHT BEARING RESTRICTIONS: No  FALLS:  Has patient fallen in last 6 months? No and "stumbles"  OCCUPATION: Retired, Art gallery manager   PLOF: Independent  PATIENT GOALS: "Improve the foot drop"  NEXT MD VISIT: September, 2024 (Neurologist)   OBJECTIVE:   DIAGNOSTIC FINDINGS:  MRI of the brain w/o contrast, and lumbar spine scheduled for 03/04/2023   PATIENT SURVEYS:  FOTO 73/79  SCREENING FOR RED FLAGS: Bowel or bladder incontinence: No Spinal tumors: No Cauda equina syndrome: No  COGNITION: Overall cognitive status: Within functional limits for tasks assessed     SENSATION: UQNS: No sensation differences between R/L  LQNS: No sensation differences between R/L. Neuropathy of bilat feet noted 2/2 diabetes.   MUSCLE LENGTH: Gastroc-soleus complex tightness bilat. Not formally measured.   POSTURE: No Significant postural limitations  PALPATION: No TTP noted.    CERVICAL AROM: Cervical screen all AROM WNL grossly, no increased pain w/ movement. No indication to further test AROM.   LOWER EXTREMITY ROM:     Active  Right eval Left eval Right  03/02/23  Hip flexion     Hip extension     Hip abduction     Hip adduction     Hip internal rotation     Hip external rotation     Knee flexion     Knee extension     Ankle dorsiflexion 9 30 12   Ankle plantarflexion [facility] WFL   Ankle inversion [facility] WFL   Ankle eversion WFL WFL    (Blank rows = not tested)  LOWER EXTREMITY MMT:    MMT Right eval Left eval  Hip flexion 4+ 4  Hip extension    Hip abduction    Hip adduction    Hip internal rotation 4+ 4+  Hip external rotation 4+ 4+  Knee flexion 4+ 4+  Knee extension 4+ 4+  Ankle dorsiflexion 2 4+  Ankle plantarflexion 4+ 4+  Ankle inversion 4+ 4+  Ankle eversion 4+ 4+   (Blank rows =  not tested)  ANKLE JOINT MOBILITY: Mild hypomobility A/P noted w/ talocrural mobilization R >L, no concordant pain noted.   LUMBAR SPECIAL TESTS:  FABER test: Negative  FUNCTIONAL TESTS:    FGA: 22/30 (please see flowsheets)     SLS: RLE: 5.5 sec, LLE: 10 sec  TODAY'S TREATMENT:                                                                                                                              DATE: 04/01/23  Ambulation w/ 10# DB in bilat UE 's 4 x100', v/c for increase R foot clearance. Heel to toe raises w/ bilat UE support x12  Mini squats w/ bilat UE support x12  Standing balance w/ eyes closed no UE support 2 x30sec  Standing semi- tandem RLE/LLE w/ intermittent UE support 2 x30sec/ each LE Standing modified SLS RLE/LLE w/ intermittent UE support 1 x30sec/ each LE Bilat gastroc stretch off of stair w/ bilat UE support 3 x 30sec  Standing heel walks 3 x10' down and back, CGA  Airex balance beam 3 x10' down and back, tandem walking, CGA Airex balance beam 3 x10' down and back, lateral stepping, CGA Reciprocal stepping over (5) orange hurdles down and back x3  Lateral stepping over (4) orange hurdles down and back x3  Bilat LE leg press 3 x10 45#, pt utilizing bilat UE 's to assist LE's to initiate push- off.    PATIENT EDUCATION:  Education details: HEP Person educated: Patient Education method: Explanation, Facilities manager, and Handouts Education comprehension: verbalized understanding  HOME EXERCISE PROGRAM: Access Code: [email or web address] URL: [email or web address] Date: 03/23/2023 Prepared by: Ronnie Derby  Exercises - Seated Ankle Circles  - 1 x daily - 7 x weekly - 2 sets - 15 reps - Standing Gastroc Stretch at Counter  - 1 x daily - 7 x weekly - 1 sets - 3 reps - 30 hold - Long Sitting Calf Stretch with Strap  - 1 x daily - 7 x weekly - 3 sets - 10 reps - Standing March with Counter Support  - 1 x daily - 7 x weekly - 3 sets - 8 reps - Heel Toe  Raises with Counter Support  - 3-4 x weekly - 2 sets - 8-10 reps - Mini Squat with Counter Support  - 3-4 x weekly - 3 sets - 12 reps - Standing Balance with Eyes Closed  - 1 x daily - 7 x weekly - 1 sets - 3 reps - 30 hold - Standing Tandem Balance with Counter Support  - 1 x daily - 7 x weekly - 1 sets - 2 reps - 30 hold - Standing Single Leg Stance with Counter Support  - 1 x daily - 7 x weekly - 1 sets - 3 reps - 30 hold  ASSESSMENT:  CLINICAL IMPRESSION:  Session focused on reviewing pt's HEP to modify balance exercises to pt's tolerance along with, progressing balance and LE strengthening activities. Pt notes difficulty with tandem and single leg balance exercises prescribed to pt's HEP, requiring modifications to be made including a staggered tandem stance and a "stork" positioning for  SLS. Pt notes improvements in functional balance, however displays mild difficulty with R toe clearance when ambulating over obstacles, and anterior LOB with lateral stepping. Pt would continue to benefit from skilled PT interventions to address remaining R foot/ ankle strength, ROM and balance deficits to improve QoL and return to PLOF.   OBJECTIVE IMPAIRMENTS: decreased mobility, decreased ROM, decreased strength, and hypomobility.   ACTIVITY LIMITATIONS: carrying heavy objects  PARTICIPATION LIMITATIONS: N/A  PERSONAL FACTORS: Age, Time since onset of injury/illness/exacerbation, and 3+ comorbidities: CKD, HTN, Type 2 diabetes, lymphedema  are also affecting patient's functional outcome.   REHAB POTENTIAL: Good  CLINICAL DECISION MAKING: Stable/uncomplicated  EVALUATION COMPLEXITY: Low   GOALS: Goals reviewed with patient? Yes  SHORT TERM GOALS: Target date: 03/25/2023  Pt will be IND with HEP to increase R LE functional mobility in ankle DF.  Baseline: HEP given to pt 02/25/23 Goal status: INITIAL   LONG TERM GOALS: Target date: 04/08/2023  Pt will improve FOTO to target score to  demonstrate clinically significant improvement in functional mobility. Baseline: 02/25/2023: 73/79 Goal status: INITIAL  2.  Pt will improve FGA score by 4 points to display clinically significant improvement in balance.  Baseline: 02/25/2023: 22/30  Goal status: INITIAL  3.  Pt will improve R LE DF AROM to 30 deg (equal to L LE DF) to enhance toe clearance ability and reduce risk of falls.   Baseline: 02/25/2023: 9 deg. Goal status: INITIAL  PLAN:  PT FREQUENCY: 1x/week  PT DURATION: 6 weeks  PLANNED INTERVENTIONS: Therapeutic exercises, Therapeutic activity, Neuromuscular re-education, Balance training, Gait training, Patient/Family education, Self Care, Joint mobilization, Joint manipulation, Stair training, Dry Needling, Electrical stimulation, Spinal manipulation, Spinal mobilization, Cryotherapy, and Moist heat.  PLAN FOR NEXT SESSION: Progress note/ Re-cert  Lovie Macadamia, SPT   Delphia Grates. Fairly IV, PT, DPT Physical Therapist- [place]  [facility] 04/01/2023, 4:43 PM

## 2023-04-02 DIAGNOSIS — N186 End stage renal disease: Secondary | ICD-10-CM | POA: Diagnosis not present

## 2023-04-02 DIAGNOSIS — N2581 Secondary hyperparathyroidism of renal origin: Secondary | ICD-10-CM | POA: Diagnosis not present

## 2023-04-02 DIAGNOSIS — Z992 Dependence on renal dialysis: Secondary | ICD-10-CM | POA: Diagnosis not present

## 2023-04-02 DIAGNOSIS — D631 Anemia in chronic kidney disease: Secondary | ICD-10-CM | POA: Diagnosis not present

## 2023-04-05 DIAGNOSIS — Z992 Dependence on renal dialysis: Secondary | ICD-10-CM | POA: Diagnosis not present

## 2023-04-05 DIAGNOSIS — N186 End stage renal disease: Secondary | ICD-10-CM | POA: Diagnosis not present

## 2023-04-05 DIAGNOSIS — D631 Anemia in chronic kidney disease: Secondary | ICD-10-CM | POA: Diagnosis not present

## 2023-04-05 DIAGNOSIS — N2581 Secondary hyperparathyroidism of renal origin: Secondary | ICD-10-CM | POA: Diagnosis not present

## 2023-04-06 DIAGNOSIS — M5136 Other intervertebral disc degeneration, lumbar region: Secondary | ICD-10-CM | POA: Diagnosis not present

## 2023-04-06 DIAGNOSIS — M5416 Radiculopathy, lumbar region: Secondary | ICD-10-CM | POA: Diagnosis not present

## 2023-04-06 DIAGNOSIS — M5126 Other intervertebral disc displacement, lumbar region: Secondary | ICD-10-CM | POA: Diagnosis not present

## 2023-04-07 DIAGNOSIS — Z992 Dependence on renal dialysis: Secondary | ICD-10-CM | POA: Diagnosis not present

## 2023-04-07 DIAGNOSIS — D631 Anemia in chronic kidney disease: Secondary | ICD-10-CM | POA: Diagnosis not present

## 2023-04-07 DIAGNOSIS — N2581 Secondary hyperparathyroidism of renal origin: Secondary | ICD-10-CM | POA: Diagnosis not present

## 2023-04-07 DIAGNOSIS — N186 End stage renal disease: Secondary | ICD-10-CM | POA: Diagnosis not present

## 2023-04-08 ENCOUNTER — Ambulatory Visit: Payer: Medicare Other

## 2023-04-08 DIAGNOSIS — M21371 Foot drop, right foot: Secondary | ICD-10-CM

## 2023-04-08 DIAGNOSIS — M6281 Muscle weakness (generalized): Secondary | ICD-10-CM | POA: Diagnosis not present

## 2023-04-08 DIAGNOSIS — Z23 Encounter for immunization: Secondary | ICD-10-CM | POA: Diagnosis not present

## 2023-04-08 NOTE — Therapy (Addendum)
OUTPATIENT PHYSICAL THERAPY THORACOLUMBAR TREATMENT/RECERT   Patient Name: Mark Bauer MRN: [id number] DOB:Apr 18, 1956, 67 y.o., male Today's Date: 04/08/2023  END OF SESSION:  PT End of Session - 04/08/23 0952     Visit Number 7    Number of Visits 13    Date for PT Re-Evaluation 05/20/23    PT Start Time 0951    PT Stop Time 1030    PT Time Calculation (min) 39 min    Activity Tolerance Patient tolerated treatment well              Past Medical History:  Diagnosis Date   Allergy    Bilateral undescended testicles    Cancer (HCC)    melanoma on forehead   Cataract    OU   Diabetes mellitus without complication (HCC)    Dysrhythmia    a fib   GERD (gastroesophageal reflux disease)    Gout    Hepatitis    polycystic    Hypertension    Hypokalemia    Mixed hyperlipidemia    Polycystic kidney    Polycystic liver disease    Vitamin D deficiency    Past Surgical History:  Procedure Laterality Date   A/V FISTULAGRAM Left 12/15/2022   Procedure: A/V Fistulagram;  Surgeon: Renford Dills, MD;  Location: [facility];  Service: Cardiovascular;  Laterality: Left;   BASCILIC VEIN TRANSPOSITION Left 02/23/2020   Procedure: BASCILIC VEIN TRANSPOSITION (SINGLE STAGE);  Surgeon: Renford Dills, MD;  Location: [facility];  Service: Vascular;  Laterality: Left;   COLONOSCOPY  2012   hemorrhoids   melanoma removal     forehead   Patient Active Problem List   Diagnosis Date Noted   Right foot drop 01/05/2023   End stage renal disease (HCC) 06/04/2021   Anticoagulated 05/12/2021   Complication of vascular access for dialysis 06/06/2020   Fistula 02/23/2020   Polycystic liver disease 01/10/2020   Polycystic kidney disease 03/01/2019   Metabolic acidosis 09/13/2018   CKD stage 4 due to type 2 diabetes mellitus (HCC) 01/25/2018   Type 2 diabetes mellitus with diabetic nephropathy, without long-term current use of insulin (HCC) 01/25/2018   Secondary  hyperparathyroidism of renal origin (HCC) 08/19/2017   Chronic atrial fibrillation (HCC) 01/06/2017   Essential hypertension 01/06/2017   Hyperuricemia 01/06/2017   Mixed hyperlipidemia 01/06/2017   Gastroesophageal reflux disease 01/06/2017   Obesity 12/26/2012    PCP: Duanne Limerick, MD  REFERRING PROVIDER: Bridgette Habermann, PA-C  REFERRING DIAG: [phone] (ICD-10-CM) - Right foot drop   Rationale for Evaluation and Treatment: Rehabilitation  THERAPY DIAG:  Foot drop, right  Muscle weakness (generalized)  ONSET DATE: June, 2024  SUBJECTIVE:  SUBJECTIVE STATEMENT: Pt reports no pain in the RLE at today's session. Pt reports he did not get much sleep last night, but not related to his R ankle.   PERTINENT HISTORY:  Patient presenting with foot drop which began 1.5 months ago. States this began suddenly. States that he went to bed and woke up with symptoms. Symptoms came on abruptly. No known trigger. No recent injuries. States over the past month and a half symptoms of gotten about "3% better". He is able to very slightly dorsiflex the right foot. States that he has adjusted his walking to compensate for this. Walking without assistance. No recent falls. He feels generally steady. Does report numbness and tingling in bilateral lower extremities. States that this began about 6 to 8 months ago after being diagnosed with peripheral neuropathy. This is limited to the feet. It is constant. Can be worse in the evenings. Pt denies radicular pain or sensory changes. Does have prior history of low back issues including herniated disc.  PAIN:  Are you having pain? Yes: NPRS scale: 0/10 Pain location: No pain, neuropathy related pain at dorsum of bilat feet 5-6/10. Pain description: N/A Aggravating factors:  N/A Relieving factors: Medications (paragaba)  PRECAUTIONS: None  RED FLAGS: Bowel or bladder incontinence: No and Spinal tumors: No   WEIGHT BEARING RESTRICTIONS: No  FALLS:  Has patient fallen in last 6 months? No and "stumbles"  OCCUPATION: Retired, Art gallery manager   PLOF: Independent  PATIENT GOALS: "Improve the foot drop"  NEXT MD VISIT: September, 2024 (Neurologist)   OBJECTIVE:   DIAGNOSTIC FINDINGS:  MRI of the brain w/o contrast, and lumbar spine scheduled for 03/04/2023   PATIENT SURVEYS:  FOTO 73/79  SCREENING FOR RED FLAGS: Bowel or bladder incontinence: No Spinal tumors: No Cauda equina syndrome: No  COGNITION: Overall cognitive status: Within functional limits for tasks assessed     SENSATION: UQNS: No sensation differences between R/L  LQNS: No sensation differences between R/L. Neuropathy of bilat feet noted 2/2 diabetes.   MUSCLE LENGTH: Gastroc-soleus complex tightness bilat. Not formally measured.   POSTURE: No Significant postural limitations  PALPATION: No TTP noted.    CERVICAL AROM: Cervical screen all AROM WNL grossly, no increased pain w/ movement. No indication to further test AROM.   LOWER EXTREMITY ROM:     Active  Right eval Left eval Right  03/02/23  Hip flexion     Hip extension     Hip abduction     Hip adduction     Hip internal rotation     Hip external rotation     Knee flexion     Knee extension     Ankle dorsiflexion 9 30 12   Ankle plantarflexion [facility] WFL   Ankle inversion [facility] WFL   Ankle eversion WFL WFL    (Blank rows = not tested)  LOWER EXTREMITY MMT:    MMT Right eval Left eval  Hip flexion 4+ 4  Hip extension    Hip abduction    Hip adduction    Hip internal rotation 4+ 4+  Hip external rotation 4+ 4+  Knee flexion 4+ 4+  Knee extension 4+ 4+  Ankle dorsiflexion 2 4+  Ankle plantarflexion 4+ 4+  Ankle inversion 4+ 4+  Ankle eversion 4+ 4+   (Blank rows = not tested)  ANKLE JOINT MOBILITY: Mild  hypomobility A/P noted w/ talocrural mobilization R >L, no concordant pain noted.   LUMBAR SPECIAL TESTS:  FABER test: Negative  FUNCTIONAL TESTS:  FGA: 22/30 (please see flowsheets)     SLS: RLE: 5.5 sec, LLE: 10 sec  TODAY'S TREATMENT:                                                                                                                              DATE: 04/08/23   Beginning of session spent reassessing pt's goals and POC to complete re-cert and progress note. (See below)    There- ex:  Bilat gastroc stretch off of stair w/ bilat UE support 2 x 30sec  Airex balance beam 3 x10' down and back, tandem walking, CGA  Tandem stance w/ RLE/ LLE x 30 sec  Standing heel walks 3 x10' down and back, CGA intermittent single UE support  PATIENT EDUCATION:  Education details: HEP Person educated: Patient Education method: Explanation, Demonstration, and Handouts Education comprehension: verbalized understanding  HOME EXERCISE PROGRAM: Access Code: [email or web address] URL: [email or web address] Date: 03/23/2023 Prepared by: Ronnie Derby  Exercises - Seated Ankle Circles  - 1 x daily - 7 x weekly - 2 sets - 15 reps - Standing Gastroc Stretch at Counter  - 1 x daily - 7 x weekly - 1 sets - 3 reps - 30 hold - Long Sitting Calf Stretch with Strap  - 1 x daily - 7 x weekly - 3 sets - 10 reps - Standing March with Counter Support  - 1 x daily - 7 x weekly - 3 sets - 8 reps - Heel Toe Raises with Counter Support  - 3-4 x weekly - 2 sets - 8-10 reps - Mini Squat with Counter Support  - 3-4 x weekly - 3 sets - 12 reps - Standing Balance with Eyes Closed  - 1 x daily - 7 x weekly - 1 sets - 3 reps - 30 hold - Standing Tandem Balance with Counter Support  - 1 x daily - 7 x weekly - 1 sets - 2 reps - 30 hold - Standing Single Leg Stance with Counter Support  - 1 x daily - 7 x weekly - 1 sets - 3 reps - 30 hold  ASSESSMENT:  CLINICAL IMPRESSION:  Session focused on reviewing  pt's STG and LTG's as pt requires a progress note and re-cert at today's visit. Pt notes progression in his goals noted w/ meeting 1/4 of his goals. Pt accomplishing his HEP compliance goal and displaying improved RLE DF AROM to 15 deg, a FOTO score of 78/79. Pt maintaining his FGA score at 21/30 when previously he scored a 22/30. 30 second STS assessment completed with pt today noting 11.5 total STS which is less than his age matched norm of 79, noting decreased bilat LE strength. Pt continues to address concerns regarding his balance, however has noted functional improvements in RLE AROM and strength. Pt and therapist agree that pt would continue to benefit from 1x/week for 6 additional weeks of PT to address remaining balance, RLE DF AROM and strength deficits to improve QoL  and return to PLOF.   OBJECTIVE IMPAIRMENTS: decreased mobility, decreased ROM, decreased strength, and hypomobility.   ACTIVITY LIMITATIONS: carrying heavy objects  PARTICIPATION LIMITATIONS: N/A  PERSONAL FACTORS: Age, Time since onset of injury/illness/exacerbation, and 3+ comorbidities: CKD, HTN, Type 2 diabetes, lymphedema  are also affecting patient's functional outcome.   REHAB POTENTIAL: Good  CLINICAL DECISION MAKING: Stable/uncomplicated  EVALUATION COMPLEXITY: Low   GOALS: Goals reviewed with patient? Yes  SHORT TERM GOALS: Target date: 03/25/2023  Pt will be IND with HEP to increase R LE functional mobility in ankle DF.  Baseline: HEP given to pt 02/25/23 04/08/23: HEP compliant  Goal status: MET   LONG TERM GOALS: Target date: 05/20/2023  Pt will improve FOTO to target score to demonstrate clinically significant improvement in functional mobility. Baseline: 02/25/2023: 73/79 04/08/23: 78/79 Goal status: ONGOING  2.  Pt will improve FGA score by 4 points to display clinically significant improvement in balance.  Baseline: 02/25/2023: 22/30 04/08/23: 21/30 Goal status: ONGOING  3.  Pt will improve R  LE DF AROM to 30 deg (equal to L LE DF) to enhance toe clearance ability and reduce risk of falls.   Baseline: 02/25/2023: 9 deg. 04/08/23: 15 deg. Goal status: ONGOING   4. Pt will improve 5xSTS to 16 to display clinical significance in bilat LE strength, equal to his age matched norms.   Baseline: 04/08/2023: 11.5  Goal status: INITIAL    PLAN:  PT FREQUENCY: 1x/week  PT DURATION: 6 weeks  PLANNED INTERVENTIONS: Therapeutic exercises, Therapeutic activity, Neuromuscular re-education, Balance training, Gait training, Patient/Family education, Self Care, Joint mobilization, Joint manipulation, Stair training, Dry Needling, Electrical stimulation, Spinal manipulation, Spinal mobilization, Cryotherapy, and Moist heat.  PLAN FOR NEXT SESSION: Continue to progress balance and RLE DF AROM and strength exercises.   Lovie Macadamia, SPT   Delphia Grates. Fairly IV, PT, DPT Physical Therapist- [place]  [facility] 04/08/2023, 1:13 PM

## 2023-04-09 DIAGNOSIS — N186 End stage renal disease: Secondary | ICD-10-CM | POA: Diagnosis not present

## 2023-04-09 DIAGNOSIS — Z992 Dependence on renal dialysis: Secondary | ICD-10-CM | POA: Diagnosis not present

## 2023-04-09 DIAGNOSIS — D631 Anemia in chronic kidney disease: Secondary | ICD-10-CM | POA: Diagnosis not present

## 2023-04-09 DIAGNOSIS — N2581 Secondary hyperparathyroidism of renal origin: Secondary | ICD-10-CM | POA: Diagnosis not present

## 2023-04-12 DIAGNOSIS — Z992 Dependence on renal dialysis: Secondary | ICD-10-CM | POA: Diagnosis not present

## 2023-04-12 DIAGNOSIS — D631 Anemia in chronic kidney disease: Secondary | ICD-10-CM | POA: Diagnosis not present

## 2023-04-12 DIAGNOSIS — N2581 Secondary hyperparathyroidism of renal origin: Secondary | ICD-10-CM | POA: Diagnosis not present

## 2023-04-12 DIAGNOSIS — N186 End stage renal disease: Secondary | ICD-10-CM | POA: Diagnosis not present

## 2023-04-13 ENCOUNTER — Ambulatory Visit: Payer: Medicare Other

## 2023-04-13 DIAGNOSIS — M21371 Foot drop, right foot: Secondary | ICD-10-CM

## 2023-04-13 DIAGNOSIS — M6281 Muscle weakness (generalized): Secondary | ICD-10-CM

## 2023-04-13 NOTE — Therapy (Addendum)
OUTPATIENT PHYSICAL THERAPY THORACOLUMBAR TREATMENT  Patient Name: Mark Bauer MRN: [id number] DOB:1956/05/06, 67 y.o., male Today's Date: 04/13/2023  END OF SESSION:  PT End of Session - 04/13/23 0944     Visit Number 8    Number of Visits 13    Date for PT Re-Evaluation 05/20/23    PT Start Time 0945    PT Stop Time 1028    PT Time Calculation (min) 43 min    Activity Tolerance Patient tolerated treatment well              Past Medical History:  Diagnosis Date   Allergy    Bilateral undescended testicles    Cancer (HCC)    melanoma on forehead   Cataract    OU   Diabetes mellitus without complication (HCC)    Dysrhythmia    a fib   GERD (gastroesophageal reflux disease)    Gout    Hepatitis    polycystic    Hypertension    Hypokalemia    Mixed hyperlipidemia    Polycystic kidney    Polycystic liver disease    Vitamin D deficiency    Past Surgical History:  Procedure Laterality Date   A/V FISTULAGRAM Left 12/15/2022   Procedure: A/V Fistulagram;  Surgeon: Renford Dills, MD;  Location: [facility];  Service: Cardiovascular;  Laterality: Left;   BASCILIC VEIN TRANSPOSITION Left 02/23/2020   Procedure: BASCILIC VEIN TRANSPOSITION (SINGLE STAGE);  Surgeon: Renford Dills, MD;  Location: [facility] ORS;  Service: Vascular;  Laterality: Left;   COLONOSCOPY  2012   hemorrhoids   melanoma removal     forehead   Patient Active Problem List   Diagnosis Date Noted   Right foot drop 01/05/2023   End stage renal disease (HCC) 06/04/2021   Anticoagulated 05/12/2021   Complication of vascular access for dialysis 06/06/2020   Fistula 02/23/2020   Polycystic liver disease 01/10/2020   Polycystic kidney disease 03/01/2019   Metabolic acidosis 09/13/2018   CKD stage 4 due to type 2 diabetes mellitus (HCC) 01/25/2018   Type 2 diabetes mellitus with diabetic nephropathy, without long-term current use of insulin (HCC) 01/25/2018   Secondary  hyperparathyroidism of renal origin (HCC) 08/19/2017   Chronic atrial fibrillation (HCC) 01/06/2017   Essential hypertension 01/06/2017   Hyperuricemia 01/06/2017   Mixed hyperlipidemia 01/06/2017   Gastroesophageal reflux disease 01/06/2017   Obesity 12/26/2012    PCP: Duanne Limerick, MD  REFERRING PROVIDER: Bridgette Habermann, PA-C  REFERRING DIAG: [phone] (ICD-10-CM) - Right foot drop   Rationale for Evaluation and Treatment: Rehabilitation  THERAPY DIAG:  Foot drop, right  Muscle weakness (generalized)  ONSET DATE: June, 2024  SUBJECTIVE:  SUBJECTIVE STATEMENT: Pt reports no pain in the RLE at today's session. No notable changes over the weekend. Fatigued from dialysis.   PERTINENT HISTORY:  Patient presenting with foot drop which began 1.5 months ago. States this began suddenly. States that he went to bed and woke up with symptoms. Symptoms came on abruptly. No known trigger. No recent injuries. States over the past month and a half symptoms of gotten about "3% better". He is able to very slightly dorsiflex the right foot. States that he has adjusted his walking to compensate for this. Walking without assistance. No recent falls. He feels generally steady. Does report numbness and tingling in bilateral lower extremities. States that this began about 6 to 8 months ago after being diagnosed with peripheral neuropathy. This is limited to the feet. It is constant. Can be worse in the evenings. Pt denies radicular pain or sensory changes. Does have prior history of low back issues including herniated disc.  PAIN:  Are you having pain? Yes: NPRS scale: 0/10 Pain location: No pain, neuropathy related pain at dorsum of bilat feet 5-6/10. Pain description: N/A Aggravating factors: N/A Relieving factors:  Medications (paragaba)  PRECAUTIONS: None  RED FLAGS: Bowel or bladder incontinence: No and Spinal tumors: No   WEIGHT BEARING RESTRICTIONS: No  FALLS:  Has patient fallen in last 6 months? No and "stumbles"  OCCUPATION: Retired, Art gallery manager   PLOF: Independent  PATIENT GOALS: "Improve the foot drop"  NEXT MD VISIT: September, 2024 (Neurologist)   OBJECTIVE:   DIAGNOSTIC FINDINGS:  MRI of the brain w/o contrast, and lumbar spine scheduled for 03/04/2023   PATIENT SURVEYS:  FOTO 73/79  SCREENING FOR RED FLAGS: Bowel or bladder incontinence: No Spinal tumors: No Cauda equina syndrome: No  COGNITION: Overall cognitive status: Within functional limits for tasks assessed     SENSATION: UQNS: No sensation differences between R/L  LQNS: No sensation differences between R/L. Neuropathy of bilat feet noted 2/2 diabetes.   MUSCLE LENGTH: Gastroc-soleus complex tightness bilat. Not formally measured.   POSTURE: No Significant postural limitations  PALPATION: No TTP noted.    CERVICAL AROM: Cervical screen all AROM WNL grossly, no increased pain w/ movement. No indication to further test AROM.   LOWER EXTREMITY ROM:     Active  Right eval Left eval Right  03/02/23  Hip flexion     Hip extension     Hip abduction     Hip adduction     Hip internal rotation     Hip external rotation     Knee flexion     Knee extension     Ankle dorsiflexion 9 30 12   Ankle plantarflexion [facility] WFL   Ankle inversion [facility] WFL   Ankle eversion WFL WFL    (Blank rows = not tested)  LOWER EXTREMITY MMT:    MMT Right eval Left eval  Hip flexion 4+ 4  Hip extension    Hip abduction    Hip adduction    Hip internal rotation 4+ 4+  Hip external rotation 4+ 4+  Knee flexion 4+ 4+  Knee extension 4+ 4+  Ankle dorsiflexion 2 4+  Ankle plantarflexion 4+ 4+  Ankle inversion 4+ 4+  Ankle eversion 4+ 4+   (Blank rows = not tested)  ANKLE JOINT MOBILITY: Mild hypomobility A/P noted  w/ talocrural mobilization R >L, no concordant pain noted.   LUMBAR SPECIAL TESTS:  FABER test: Negative  FUNCTIONAL TESTS:    FGA: 22/30 (please see flowsheets)  SLS: RLE: 5.5 sec, LLE: 10 sec  TODAY'S TREATMENT:                                                                                                                              DATE: 04/13/23  There- ex:  Recumbent bike lvl 3.0 x Heel to toe raises w/ bilat UE support 2 x10  Standing mini squats onto elevated plinth 2 x8 (increased difficulty coming into standing)  Knee extension on OMEGA machine RLE/LLE bilat 20# 2x10, LLE only 5# 2 x6  Knee flexion on OMEGA machine RLE/LLE bilat 25# 3 x10  Neuro Re-ed: Tandem balance on airex pad RLE/LLE w/ horizontal head turns 2 x30sec/each side bilat intermittent UE support ; CGA  Tandem balance on level ground RLE/LLE w/ horizontal head turns 2 x30sec bilat intermittent UE support; CGA  Single leg balance RLE/LLE 2 x30 sec intermittent UE support; CGA  Tandem walking on airex pad w/ intermittent single UE support x3 down and back; CGA  PATIENT EDUCATION:  Education details: HEP Person educated: Patient Education method: Explanation, Demonstration, and Handouts Education comprehension: verbalized understanding  HOME EXERCISE PROGRAM: Access Code: [email or web address] URL: [email or web address] Date: 03/23/2023 Prepared by: Ronnie Derby  Exercises - Seated Ankle Circles  - 1 x daily - 7 x weekly - 2 sets - 15 reps - Standing Gastroc Stretch at Counter  - 1 x daily - 7 x weekly - 1 sets - 3 reps - 30 hold - Long Sitting Calf Stretch with Strap  - 1 x daily - 7 x weekly - 3 sets - 10 reps - Standing March with Counter Support  - 1 x daily - 7 x weekly - 3 sets - 8 reps - Heel Toe Raises with Counter Support  - 3-4 x weekly - 2 sets - 8-10 reps - Mini Squat with Counter Support  - 3-4 x weekly - 3 sets - 12 reps - Standing Balance with Eyes Closed  - 1 x daily  - 7 x weekly - 1 sets - 3 reps - 30 hold - Standing Tandem Balance with Counter Support  - 1 x daily - 7 x weekly - 1 sets - 2 reps - 30 hold - Standing Single Leg Stance with Counter Support  - 1 x daily - 7 x weekly - 1 sets - 3 reps - 30 hold  ASSESSMENT:  CLINICAL IMPRESSION:  Session focused on progressing bilat LE strengthening, balance and R foot DF exercises. Pt notes improvements in RLE DF AROM w/ pt reported functional movements. Pt notes significant bilat LE weakness RLE>LLE displayed w/ difficulty of LLE single leg knee extension of 5# on OMEGA machine. Pt displays improvements in balance w/ SLS requiring only intermittent UE support. Pt would continue to benefit from PT to address remaining balance, RLE DF AROM and strength deficits to improve QoL and return to PLOF.   OBJECTIVE IMPAIRMENTS: decreased mobility, decreased ROM, decreased strength, and hypomobility.  ACTIVITY LIMITATIONS: carrying heavy objects  PARTICIPATION LIMITATIONS: N/A  PERSONAL FACTORS: Age, Time since onset of injury/illness/exacerbation, and 3+ comorbidities: CKD, HTN, Type 2 diabetes, lymphedema  are also affecting patient's functional outcome.   REHAB POTENTIAL: Good  CLINICAL DECISION MAKING: Stable/uncomplicated  EVALUATION COMPLEXITY: Low   GOALS: Goals reviewed with patient? Yes  SHORT TERM GOALS: Target date: 03/25/2023  Pt will be IND with HEP to increase R LE functional mobility in ankle DF.  Baseline: HEP given to pt 02/25/23 04/08/23: HEP compliant  Goal status: MET   LONG TERM GOALS: Target date: 05/20/2023  Pt will improve FOTO to target score to demonstrate clinically significant improvement in functional mobility. Baseline: 02/25/2023: 73/79 04/08/23: 78/79 Goal status: ONGOING  2.  Pt will improve FGA score by 4 points to display clinically significant improvement in balance.  Baseline: 02/25/2023: 22/30 04/08/23: 21/30 Goal status: ONGOING  3.  Pt will improve R LE DF AROM  to 30 deg (equal to L LE DF) to enhance toe clearance ability and reduce risk of falls.   Baseline: 02/25/2023: 9 deg. 04/08/23: 15 deg. Goal status: ONGOING   4. Pt will improve 5xSTS to 16 to display clinical significance in bilat LE strength, equal to his age matched norms.   Baseline: 04/08/2023: 11.5  Goal status: INITIAL    PLAN:  PT FREQUENCY: 1x/week  PT DURATION: 6 weeks  PLANNED INTERVENTIONS: Therapeutic exercises, Therapeutic activity, Neuromuscular re-education, Balance training, Gait training, Patient/Family education, Self Care, Joint mobilization, Joint manipulation, Stair training, Dry Needling, Electrical stimulation, Spinal manipulation, Spinal mobilization, Cryotherapy, and Moist heat.  PLAN FOR NEXT SESSION: Progress bilat LE strengthening and balance exercises  Lovie Macadamia, SPT   Delphia Grates. Fairly IV, PT, DPT Physical Therapist- [place]  [facility] 04/13/2023, 2:13 PM

## 2023-04-14 DIAGNOSIS — Z992 Dependence on renal dialysis: Secondary | ICD-10-CM | POA: Diagnosis not present

## 2023-04-14 DIAGNOSIS — N2581 Secondary hyperparathyroidism of renal origin: Secondary | ICD-10-CM | POA: Diagnosis not present

## 2023-04-14 DIAGNOSIS — D631 Anemia in chronic kidney disease: Secondary | ICD-10-CM | POA: Diagnosis not present

## 2023-04-14 DIAGNOSIS — N186 End stage renal disease: Secondary | ICD-10-CM | POA: Diagnosis not present

## 2023-04-16 DIAGNOSIS — D631 Anemia in chronic kidney disease: Secondary | ICD-10-CM | POA: Diagnosis not present

## 2023-04-16 DIAGNOSIS — Z992 Dependence on renal dialysis: Secondary | ICD-10-CM | POA: Diagnosis not present

## 2023-04-16 DIAGNOSIS — N2581 Secondary hyperparathyroidism of renal origin: Secondary | ICD-10-CM | POA: Diagnosis not present

## 2023-04-16 DIAGNOSIS — N186 End stage renal disease: Secondary | ICD-10-CM | POA: Diagnosis not present

## 2023-04-19 ENCOUNTER — Other Ambulatory Visit (INDEPENDENT_AMBULATORY_CARE_PROVIDER_SITE_OTHER): Payer: Self-pay | Admitting: Nurse Practitioner

## 2023-04-19 DIAGNOSIS — Q612 Polycystic kidney, adult type: Secondary | ICD-10-CM | POA: Diagnosis not present

## 2023-04-19 DIAGNOSIS — N186 End stage renal disease: Secondary | ICD-10-CM

## 2023-04-19 DIAGNOSIS — N2581 Secondary hyperparathyroidism of renal origin: Secondary | ICD-10-CM | POA: Diagnosis not present

## 2023-04-19 DIAGNOSIS — D631 Anemia in chronic kidney disease: Secondary | ICD-10-CM | POA: Diagnosis not present

## 2023-04-19 DIAGNOSIS — Z992 Dependence on renal dialysis: Secondary | ICD-10-CM | POA: Diagnosis not present

## 2023-04-20 ENCOUNTER — Other Ambulatory Visit: Payer: Self-pay | Admitting: Family Medicine

## 2023-04-20 ENCOUNTER — Ambulatory Visit (INDEPENDENT_AMBULATORY_CARE_PROVIDER_SITE_OTHER): Payer: Medicare Other

## 2023-04-20 ENCOUNTER — Ambulatory Visit: Payer: Medicare Other | Attending: Physician Assistant

## 2023-04-20 DIAGNOSIS — N186 End stage renal disease: Secondary | ICD-10-CM | POA: Diagnosis not present

## 2023-04-20 DIAGNOSIS — M21371 Foot drop, right foot: Secondary | ICD-10-CM | POA: Insufficient documentation

## 2023-04-20 DIAGNOSIS — M6281 Muscle weakness (generalized): Secondary | ICD-10-CM | POA: Diagnosis not present

## 2023-04-20 DIAGNOSIS — E782 Mixed hyperlipidemia: Secondary | ICD-10-CM

## 2023-04-20 MED ORDER — LOVASTATIN 20 MG PO TABS
20.0000 mg | ORAL_TABLET | Freq: Every day | ORAL | 0 refills | Status: DC
Start: 2023-04-20 — End: 2023-07-15

## 2023-04-20 NOTE — Telephone Encounter (Signed)
Medication Refill - Medication: lovastatin (MEVACOR) 20 MG tablet   Has the patient contacted their pharmacy? Yes.   (Agent: If no, request that the patient contact the pharmacy for the refill. If patient does not wish to contact the pharmacy document the reason why and proceed with request.) (Agent: If yes, when and what did the pharmacy advise?)  Preferred Pharmacy (with phone number or street name):  [id number] [place], [place] - [address]  [address] [place] [address]  Phone: [phone] Fax: [phone]   Has the patient been seen for an appointment in the last year OR does the patient have an upcoming appointment? Yes.    Agent: Please be advised that RX refills may take up to 3 business days. We ask that you follow-up with your pharmacy.

## 2023-04-20 NOTE — Telephone Encounter (Signed)
Requested Prescriptions  Pending Prescriptions Disp Refills   lovastatin (MEVACOR) 20 MG tablet 90 tablet 0    Sig: Take 1 tablet (20 mg total) by mouth daily.     Cardiovascular:  Antilipid - Statins 2 Failed - 04/20/2023  8:06 AM      Failed - Cr in normal range and within 360 days    Creatinine, Ser  Date Value Ref Range Status  09/14/2022 9.53 (H) 0.76 - 1.27 mg/dL Final         Failed - Lipid Panel in normal range within the last 12 months    Cholesterol, Total  Date Value Ref Range Status  09/14/2022 83 (L) 100 - 199 mg/dL Final   LDL Chol Calc (NIH)  Date Value Ref Range Status  09/14/2022 37 0 - 99 mg/dL Final   HDL  Date Value Ref Range Status  09/14/2022 32 (L) >39 mg/dL Final   Triglycerides  Date Value Ref Range Status  09/14/2022 62 0 - 149 mg/dL Final         Passed - Patient is not pregnant      Passed - Valid encounter within last 12 months    Recent Outpatient Visits           3 months ago Type 2 diabetes mellitus with diabetic nephropathy, without long-term current use of insulin (HCC)   [place] [facility], MD   3 months ago Right foot drop   [place] [facility], Ocie Bob, MD   7 months ago Weight gain with edema   [facility], MD   11 months ago Type 2 diabetes mellitus with diabetic nephropathy, without long-term current use of insulin (HCC)   [place] [facility], MD   1 year ago Venous insufficiency    [facility] In[facility], MD

## 2023-04-20 NOTE — Therapy (Addendum)
OUTPATIENT PHYSICAL THERAPY THORACOLUMBAR TREATMENT  Patient Name: Mark Bauer MRN: [id number] DOB:[birth year], 67 y.o., male Today's Date: 04/20/2023  END OF SESSION:  PT End of Session - 04/20/23 0943     Visit Number 9    Number of Visits 13    Date for PT Re-Evaluation 05/20/23    PT Start Time 0945    PT Stop Time 1026    PT Time Calculation (min) 41 min    Equipment Utilized During Treatment Gait belt    Activity Tolerance Patient tolerated treatment well              Past Medical History:  Diagnosis Date   Allergy    Bilateral undescended testicles    Cancer (HCC)    melanoma on forehead   Cataract    OU   Diabetes mellitus without complication (HCC)    Dysrhythmia    a fib   GERD (gastroesophageal reflux disease)    Gout    Hepatitis    polycystic    Hypertension    Hypokalemia    Mixed hyperlipidemia    Polycystic kidney    Polycystic liver disease    Vitamin D deficiency    Past Surgical History:  Procedure Laterality Date   A/V FISTULAGRAM Left 12/15/2022   Procedure: A/V Fistulagram;  Surgeon: Renford Dills, MD;  Location: [facility];  Service: Cardiovascular;  Laterality: Left;   BASCILIC VEIN TRANSPOSITION Left 02/23/2020   Procedure: BASCILIC VEIN TRANSPOSITION (SINGLE STAGE);  Surgeon: Renford Dills, MD;  Location: [facility];  Service: Vascular;  Laterality: Left;   COLONOSCOPY  2012   hemorrhoids   melanoma removal     forehead   Patient Active Problem List   Diagnosis Date Noted   Right foot drop 01/05/2023   End stage renal disease (HCC) 06/04/2021   Anticoagulated 05/12/2021   Complication of vascular access for dialysis 06/06/2020   Fistula 02/23/2020   Polycystic liver disease 01/10/2020   Polycystic kidney disease 03/01/2019   Metabolic acidosis 09/13/2018   CKD stage 4 due to type 2 diabetes mellitus (HCC) 01/25/2018   Type 2 diabetes mellitus with diabetic nephropathy, without long-term current use of  insulin (HCC) 01/25/2018   Secondary hyperparathyroidism of renal origin (HCC) 08/19/2017   Chronic atrial fibrillation (HCC) 01/06/2017   Essential hypertension 01/06/2017   Hyperuricemia 01/06/2017   Mixed hyperlipidemia 01/06/2017   Gastroesophageal reflux disease 01/06/2017   Obesity 12/26/2012    PCP: Duanne Limerick, MD  REFERRING PROVIDER: Bridgette Habermann, PA-C  REFERRING DIAG: [phone] (ICD-10-CM) - Right foot drop   Rationale for Evaluation and Treatment: Rehabilitation  THERAPY DIAG:  Foot drop, right  Muscle weakness (generalized)  ONSET DATE: June, 2024  SUBJECTIVE:  SUBJECTIVE STATEMENT: Pt reports no pain in the RLE at today's session. Pt reports difficulty w/ completing balance exercises in HEP.   PERTINENT HISTORY:  Patient presenting with foot drop which began 1.5 months ago. States this began suddenly. States that he went to bed and woke up with symptoms. Symptoms came on abruptly. No known trigger. No recent injuries. States over the past month and a half symptoms of gotten about "3% better". He is able to very slightly dorsiflex the right foot. States that he has adjusted his walking to compensate for this. Walking without assistance. No recent falls. He feels generally steady. Does report numbness and tingling in bilateral lower extremities. States that this began about 6 to 8 months ago after being diagnosed with peripheral neuropathy. This is limited to the feet. It is constant. Can be worse in the evenings. Pt denies radicular pain or sensory changes. Does have prior history of low back issues including herniated disc.  PAIN:  Are you having pain? Yes: NPRS scale: 0/10 Pain location: No pain, neuropathy related pain at dorsum of bilat feet 5-6/10. Pain description:  N/A Aggravating factors: N/A Relieving factors: Medications (paragaba)  PRECAUTIONS: None  RED FLAGS: Bowel or bladder incontinence: No and Spinal tumors: No   WEIGHT BEARING RESTRICTIONS: No  FALLS:  Has patient fallen in last 6 months? No and "stumbles"  OCCUPATION: Retired, Art gallery manager   PLOF: Independent  PATIENT GOALS: "Improve the foot drop"  NEXT MD VISIT: September, 2024 (Neurologist)   OBJECTIVE:   DIAGNOSTIC FINDINGS:  MRI of the brain w/o contrast, and lumbar spine scheduled for 03/04/2023   PATIENT SURVEYS:  FOTO 73/79  SCREENING FOR RED FLAGS: Bowel or bladder incontinence: No Spinal tumors: No Cauda equina syndrome: No  COGNITION: Overall cognitive status: Within functional limits for tasks assessed     SENSATION: UQNS: No sensation differences between R/L  LQNS: No sensation differences between R/L. Neuropathy of bilat feet noted 2/2 diabetes.   MUSCLE LENGTH: Gastroc-soleus complex tightness bilat. Not formally measured.   POSTURE: No Significant postural limitations  PALPATION: No TTP noted.    CERVICAL AROM: Cervical screen all AROM WNL grossly, no increased pain w/ movement. No indication to further test AROM.   LOWER EXTREMITY ROM:     Active  Right eval Left eval Right  03/02/23  Hip flexion     Hip extension     Hip abduction     Hip adduction     Hip internal rotation     Hip external rotation     Knee flexion     Knee extension     Ankle dorsiflexion 9 30 12   Ankle plantarflexion [facility] WFL   Ankle inversion [facility] WFL   Ankle eversion WFL WFL    (Blank rows = not tested)  LOWER EXTREMITY MMT:    MMT Right eval Left eval  Hip flexion 4+ 4  Hip extension    Hip abduction    Hip adduction    Hip internal rotation 4+ 4+  Hip external rotation 4+ 4+  Knee flexion 4+ 4+  Knee extension 4+ 4+  Ankle dorsiflexion 2 4+  Ankle plantarflexion 4+ 4+  Ankle inversion 4+ 4+  Ankle eversion 4+ 4+   (Blank rows = not  tested)  ANKLE JOINT MOBILITY: Mild hypomobility A/P noted w/ talocrural mobilization R >L, no concordant pain noted.   LUMBAR SPECIAL TESTS:  FABER test: Negative  FUNCTIONAL TESTS:    FGA: 22/30 (please see flowsheets)  SLS: RLE: 5.5 sec, LLE: 10 sec  TODAY'S TREATMENT: DATE: 04/20/23  There- ex:  Recumbent bike lvl 3.0 x for LE strengthening and improving R ankle ROM  Heel to toe raises w/ bilat UE support 2 x10 Standing mini squats onto elevated plinth 2 x8 w/ __ KG medicine ball, 1 x8 w/ no medicine ball (increased difficulty coming into standing)  Standing alternating marches RLE/LLE x10 w/ 4# DB (difficulty noted w/ single leg balance)  Bilat Leg press at OMEGA machine #45 2 x10  Hip extension at MATRIX machine RLE #70 1 x10, RLE 85# 1 x10, LLE 85# 2 x10 Hip abduction at MATRIX machine RLE/LLE 40# 2x10/ each side   Neuro Re-ed: Modified Tandem balance on level ground RLE/LLE w/ horizontal head turns 2 x30sec bilat intermittent UE support; CGA  Upside down BOSU ball static stance 2 x30 sec, PF/DF 3 x30 sec, side- to- side 3 x30sec; all w/ intermittent bilat UE support    PATIENT EDUCATION:  Education details: HEP Person educated: Patient Education method: Explanation, Demonstration, and Handouts Education comprehension: verbalized understanding  HOME EXERCISE PROGRAM: Access Code: [email or web address] URL: [email or web address] Date: 03/23/2023 Prepared by: Ronnie Derby  Exercises - Seated Ankle Circles  - 1 x daily - 7 x weekly - 2 sets - 15 reps - Standing Gastroc Stretch at Counter  - 1 x daily - 7 x weekly - 1 sets - 3 reps - 30 hold - Long Sitting Calf Stretch with Strap  - 1 x daily - 7 x weekly - 3 sets - 10 reps - Standing March with Counter Support  - 1 x daily - 7 x weekly - 3 sets - 8 reps - Heel Toe Raises with Counter Support  - 3-4 x weekly - 2 sets - 8-10 reps - Mini Squat with Counter Support  - 3-4 x weekly - 3 sets - 12 reps -  Standing Balance with Eyes Closed  - 1 x daily - 7 x weekly - 1 sets - 3 reps - 30 hold - Standing Tandem Balance with Counter Support  - 1 x daily - 7 x weekly - 1 sets - 2 reps - 30 hold - Standing Single Leg Stance with Counter Support  - 1 x daily - 7 x weekly - 1 sets - 3 reps - 30 hold  ASSESSMENT:  CLINICAL IMPRESSION:  Session focused on progressing bilat LE strengthening, balance and R foot DF exercises. Pt notes improvements in RLE DF AROM w/ pt reported functional movements and improved heel to toe raises without compensation. Pt notes significant bilat LE weakness especially with the lower extremity posterior chain noted w/ difficulty assuming a standing position when completing mini squats from an elevated plinth. Pt would continue to benefit from PT to address remaining balance, RLE DF AROM and strength deficits to improve QoL and return to PLOF.   OBJECTIVE IMPAIRMENTS: decreased mobility, decreased ROM, decreased strength, and hypomobility.   ACTIVITY LIMITATIONS: carrying heavy objects  PARTICIPATION LIMITATIONS: N/A  PERSONAL FACTORS: Age, Time since onset of injury/illness/exacerbation, and 3+ comorbidities: CKD, HTN, Type 2 diabetes, lymphedema  are also affecting patient's functional outcome.   REHAB POTENTIAL: Good  CLINICAL DECISION MAKING: Stable/uncomplicated  EVALUATION COMPLEXITY: Low   GOALS: Goals reviewed with patient? Yes  SHORT TERM GOALS: Target date: 03/25/2023  Pt will be IND with HEP to increase R LE functional mobility in ankle DF.  Baseline: HEP given to pt 02/25/23 04/08/23: HEP compliant  Goal status: MET  LONG TERM GOALS: Target date: 05/20/2023  Pt will improve FOTO to target score to demonstrate clinically significant improvement in functional mobility. Baseline: 02/25/2023: 73/79 04/08/23: 78/79 Goal status: ONGOING  2.  Pt will improve FGA score by 4 points to display clinically significant improvement in balance.  Baseline:  02/25/2023: 22/30 04/08/23: 21/30 Goal status: ONGOING  3.  Pt will improve R LE DF AROM to 30 deg (equal to L LE DF) to enhance toe clearance ability and reduce risk of falls.   Baseline: 02/25/2023: 9 deg. 04/08/23: 15 deg. Goal status: ONGOING   4. Pt will improve 5xSTS to 16 to display clinical significance in bilat LE strength, equal to his age matched norms.  Baseline: 04/08/2023: 11.5 Goal status: INITIAL    PLAN:  PT FREQUENCY: 1x/week  PT DURATION: 6 weeks  PLANNED INTERVENTIONS: Therapeutic exercises, Therapeutic activity, Neuromuscular re-education, Balance training, Gait training, Patient/Family education, Self Care, Joint mobilization, Joint manipulation, Stair training, Dry Needling, Electrical stimulation, Spinal manipulation, Spinal mobilization, Cryotherapy, and Moist heat.   PLAN FOR NEXT SESSION: Progress note! Progress bilat LE strengthening and balance exercises  Lovie Macadamia, SPT   Nolon Bussing, PT, DPT Physical Therapist - [facility]  04/20/23, 1:29 PM

## 2023-04-21 DIAGNOSIS — D509 Iron deficiency anemia, unspecified: Secondary | ICD-10-CM | POA: Diagnosis not present

## 2023-04-21 DIAGNOSIS — Z992 Dependence on renal dialysis: Secondary | ICD-10-CM | POA: Diagnosis not present

## 2023-04-21 DIAGNOSIS — N2581 Secondary hyperparathyroidism of renal origin: Secondary | ICD-10-CM | POA: Diagnosis not present

## 2023-04-21 DIAGNOSIS — N186 End stage renal disease: Secondary | ICD-10-CM | POA: Diagnosis not present

## 2023-04-21 DIAGNOSIS — D631 Anemia in chronic kidney disease: Secondary | ICD-10-CM | POA: Diagnosis not present

## 2023-04-22 ENCOUNTER — Ambulatory Visit: Payer: Medicare Other

## 2023-04-23 DIAGNOSIS — D631 Anemia in chronic kidney disease: Secondary | ICD-10-CM | POA: Diagnosis not present

## 2023-04-23 DIAGNOSIS — D509 Iron deficiency anemia, unspecified: Secondary | ICD-10-CM | POA: Diagnosis not present

## 2023-04-23 DIAGNOSIS — N2581 Secondary hyperparathyroidism of renal origin: Secondary | ICD-10-CM | POA: Diagnosis not present

## 2023-04-23 DIAGNOSIS — N186 End stage renal disease: Secondary | ICD-10-CM | POA: Diagnosis not present

## 2023-04-23 DIAGNOSIS — Z992 Dependence on renal dialysis: Secondary | ICD-10-CM | POA: Diagnosis not present

## 2023-04-26 DIAGNOSIS — N2581 Secondary hyperparathyroidism of renal origin: Secondary | ICD-10-CM | POA: Diagnosis not present

## 2023-04-26 DIAGNOSIS — D631 Anemia in chronic kidney disease: Secondary | ICD-10-CM | POA: Diagnosis not present

## 2023-04-26 DIAGNOSIS — D509 Iron deficiency anemia, unspecified: Secondary | ICD-10-CM | POA: Diagnosis not present

## 2023-04-26 DIAGNOSIS — N186 End stage renal disease: Secondary | ICD-10-CM | POA: Diagnosis not present

## 2023-04-26 DIAGNOSIS — Z992 Dependence on renal dialysis: Secondary | ICD-10-CM | POA: Diagnosis not present

## 2023-04-27 DIAGNOSIS — M5416 Radiculopathy, lumbar region: Secondary | ICD-10-CM | POA: Diagnosis not present

## 2023-04-27 DIAGNOSIS — M5126 Other intervertebral disc displacement, lumbar region: Secondary | ICD-10-CM | POA: Diagnosis not present

## 2023-04-28 DIAGNOSIS — D631 Anemia in chronic kidney disease: Secondary | ICD-10-CM | POA: Diagnosis not present

## 2023-04-28 DIAGNOSIS — N2581 Secondary hyperparathyroidism of renal origin: Secondary | ICD-10-CM | POA: Diagnosis not present

## 2023-04-28 DIAGNOSIS — D509 Iron deficiency anemia, unspecified: Secondary | ICD-10-CM | POA: Diagnosis not present

## 2023-04-28 DIAGNOSIS — N186 End stage renal disease: Secondary | ICD-10-CM | POA: Diagnosis not present

## 2023-04-28 DIAGNOSIS — Z992 Dependence on renal dialysis: Secondary | ICD-10-CM | POA: Diagnosis not present

## 2023-04-29 ENCOUNTER — Ambulatory Visit (INDEPENDENT_AMBULATORY_CARE_PROVIDER_SITE_OTHER): Payer: BLUE CROSS/BLUE SHIELD | Admitting: Vascular Surgery

## 2023-04-30 DIAGNOSIS — Z992 Dependence on renal dialysis: Secondary | ICD-10-CM | POA: Diagnosis not present

## 2023-04-30 DIAGNOSIS — N2581 Secondary hyperparathyroidism of renal origin: Secondary | ICD-10-CM | POA: Diagnosis not present

## 2023-04-30 DIAGNOSIS — D631 Anemia in chronic kidney disease: Secondary | ICD-10-CM | POA: Diagnosis not present

## 2023-04-30 DIAGNOSIS — D509 Iron deficiency anemia, unspecified: Secondary | ICD-10-CM | POA: Diagnosis not present

## 2023-04-30 DIAGNOSIS — N186 End stage renal disease: Secondary | ICD-10-CM | POA: Diagnosis not present

## 2023-05-03 DIAGNOSIS — Z992 Dependence on renal dialysis: Secondary | ICD-10-CM | POA: Diagnosis not present

## 2023-05-03 DIAGNOSIS — D509 Iron deficiency anemia, unspecified: Secondary | ICD-10-CM | POA: Diagnosis not present

## 2023-05-03 DIAGNOSIS — D631 Anemia in chronic kidney disease: Secondary | ICD-10-CM | POA: Diagnosis not present

## 2023-05-03 DIAGNOSIS — N186 End stage renal disease: Secondary | ICD-10-CM | POA: Diagnosis not present

## 2023-05-03 DIAGNOSIS — N2581 Secondary hyperparathyroidism of renal origin: Secondary | ICD-10-CM | POA: Diagnosis not present

## 2023-05-05 DIAGNOSIS — N186 End stage renal disease: Secondary | ICD-10-CM | POA: Diagnosis not present

## 2023-05-05 DIAGNOSIS — D631 Anemia in chronic kidney disease: Secondary | ICD-10-CM | POA: Diagnosis not present

## 2023-05-05 DIAGNOSIS — D509 Iron deficiency anemia, unspecified: Secondary | ICD-10-CM | POA: Diagnosis not present

## 2023-05-05 DIAGNOSIS — N2581 Secondary hyperparathyroidism of renal origin: Secondary | ICD-10-CM | POA: Diagnosis not present

## 2023-05-05 DIAGNOSIS — Z992 Dependence on renal dialysis: Secondary | ICD-10-CM | POA: Diagnosis not present

## 2023-05-06 ENCOUNTER — Ambulatory Visit: Payer: Medicare Other

## 2023-05-06 DIAGNOSIS — M6281 Muscle weakness (generalized): Secondary | ICD-10-CM

## 2023-05-06 DIAGNOSIS — M21371 Foot drop, right foot: Secondary | ICD-10-CM | POA: Diagnosis not present
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
## 2023-05-07 DIAGNOSIS — D631 Anemia in chronic kidney disease: Secondary | ICD-10-CM | POA: Diagnosis not present

## 2023-05-07 DIAGNOSIS — N2581 Secondary hyperparathyroidism of renal origin: Secondary | ICD-10-CM | POA: Diagnosis not present

## 2023-05-07 DIAGNOSIS — Z992 Dependence on renal dialysis: Secondary | ICD-10-CM | POA: Diagnosis not present

## 2023-05-07 DIAGNOSIS — D509 Iron deficiency anemia, unspecified: Secondary | ICD-10-CM | POA: Diagnosis not present

## 2023-05-07 DIAGNOSIS — N186 End stage renal disease: Secondary | ICD-10-CM | POA: Diagnosis not present

## 2023-05-10 DIAGNOSIS — Z992 Dependence on renal dialysis: Secondary | ICD-10-CM | POA: Diagnosis not present

## 2023-05-10 DIAGNOSIS — N2581 Secondary hyperparathyroidism of renal origin: Secondary | ICD-10-CM | POA: Diagnosis not present

## 2023-05-10 DIAGNOSIS — D631 Anemia in chronic kidney disease: Secondary | ICD-10-CM | POA: Diagnosis not present

## 2023-05-10 DIAGNOSIS — N186 End stage renal disease: Secondary | ICD-10-CM | POA: Diagnosis not present

## 2023-05-10 DIAGNOSIS — D509 Iron deficiency anemia, unspecified: Secondary | ICD-10-CM | POA: Diagnosis not present

## 2023-05-12 DIAGNOSIS — D631 Anemia in chronic kidney disease: Secondary | ICD-10-CM | POA: Diagnosis not present

## 2023-05-12 DIAGNOSIS — N186 End stage renal disease: Secondary | ICD-10-CM | POA: Diagnosis not present

## 2023-05-12 DIAGNOSIS — N2581 Secondary hyperparathyroidism of renal origin: Secondary | ICD-10-CM | POA: Diagnosis not present

## 2023-05-12 DIAGNOSIS — Z992 Dependence on renal dialysis: Secondary | ICD-10-CM | POA: Diagnosis not present

## 2023-05-12 DIAGNOSIS — D509 Iron deficiency anemia, unspecified: Secondary | ICD-10-CM | POA: Diagnosis not present

## 2023-05-13 ENCOUNTER — Ambulatory Visit: Payer: Medicare Other

## 2023-05-13 DIAGNOSIS — M21371 Foot drop, right foot: Secondary | ICD-10-CM

## 2023-05-13 DIAGNOSIS — M6281 Muscle weakness (generalized): Secondary | ICD-10-CM | POA: Diagnosis not present

## 2023-05-13 NOTE — Therapy (Addendum)
OUTPATIENT PHYSICAL THERAPY THORACOLUMBAR TREATMENT  Patient Name: Mark Bauer MRN: [id number] DOB:27-Mar-1956, 67 y.o., male Today's Date: 05/13/2023  END OF SESSION:  PT End of Session - 05/13/23 0900     Visit Number 11    Number of Visits 13    Date for PT Re-Evaluation 05/20/23    PT Start Time 0900    PT Stop Time 0943    PT Time Calculation (min) 43 min    Equipment Utilized During Treatment Gait belt    Activity Tolerance Patient tolerated treatment well              Past Medical History:  Diagnosis Date   Allergy    Bilateral undescended testicles    Cancer (HCC)    melanoma on forehead   Cataract    OU   Diabetes mellitus without complication (HCC)    Dysrhythmia    a fib   GERD (gastroesophageal reflux disease)    Gout    Hepatitis    polycystic    Hypertension    Hypokalemia    Mixed hyperlipidemia    Polycystic kidney    Polycystic liver disease    Vitamin D deficiency    Past Surgical History:  Procedure Laterality Date   A/V FISTULAGRAM Left 12/15/2022   Procedure: A/V Fistulagram;  Surgeon: Renford Dills, MD;  Location: [facility];  Service: Cardiovascular;  Laterality: Left;   BASCILIC VEIN TRANSPOSITION Left 02/23/2020   Procedure: BASCILIC VEIN TRANSPOSITION (SINGLE STAGE);  Surgeon: Renford Dills, MD;  Location: [facility];  Service: Vascular;  Laterality: Left;   COLONOSCOPY  2012   hemorrhoids   melanoma removal     forehead   Patient Active Problem List   Diagnosis Date Noted   Right foot drop 01/05/2023   End stage renal disease (HCC) 06/04/2021   Anticoagulated 05/12/2021   Complication of vascular access for dialysis 06/06/2020   Fistula 02/23/2020   Polycystic liver disease 01/10/2020   Polycystic kidney disease 03/01/2019   Metabolic acidosis 09/13/2018   CKD stage 4 due to type 2 diabetes mellitus (HCC) 01/25/2018   Type 2 diabetes mellitus with diabetic nephropathy, without long-term current use of  insulin (HCC) 01/25/2018   Secondary hyperparathyroidism of renal origin (HCC) 08/19/2017   Chronic atrial fibrillation (HCC) 01/06/2017   Essential hypertension 01/06/2017   Hyperuricemia 01/06/2017   Mixed hyperlipidemia 01/06/2017   Gastroesophageal reflux disease 01/06/2017   Obesity 12/26/2012    PCP: Duanne Limerick, MD  REFERRING PROVIDER: Bridgette Habermann, PA-C  REFERRING DIAG: [phone] (ICD-10-CM) - Right foot drop   Rationale for Evaluation and Treatment: Rehabilitation  THERAPY DIAG:  Foot drop, right  Muscle weakness (generalized)  ONSET DATE: June, 2024  SUBJECTIVE:  SUBJECTIVE STATEMENT: Pt reports no pain in the RLE at today's session. Pt reports continued deficits with his balance. No notable changes since last session.   PERTINENT HISTORY:  Patient presenting with foot drop which began 1.5 months ago. States this began suddenly. States that he went to bed and woke up with symptoms. Symptoms came on abruptly. No known trigger. No recent injuries. States over the past month and a half symptoms of gotten about "3% better". He is able to very slightly dorsiflex the right foot. States that he has adjusted his walking to compensate for this. Walking without assistance. No recent falls. He feels generally steady. Does report numbness and tingling in bilateral lower extremities. States that this began about 6 to 8 months ago after being diagnosed with peripheral neuropathy. This is limited to the feet. It is constant. Can be worse in the evenings. Pt denies radicular pain or sensory changes. Does have prior history of low back issues including herniated disc.  PAIN:  Are you having pain? Yes: NPRS scale: 0/10 Pain location: No pain, neuropathy related pain at dorsum of bilat feet 5-6/10. Pain  description: N/A Aggravating factors: N/A Relieving factors: Medications (paragaba)  PRECAUTIONS: None  RED FLAGS: Bowel or bladder incontinence: No and Spinal tumors: No   WEIGHT BEARING RESTRICTIONS: No  FALLS:  Has patient fallen in last 6 months? No and "stumbles"  OCCUPATION: Retired, Art gallery manager   PLOF: Independent  PATIENT GOALS: "Improve the foot drop"  NEXT MD VISIT: September, 2024 (Neurologist)   OBJECTIVE:   DIAGNOSTIC FINDINGS:  MRI of the brain w/o contrast, and lumbar spine scheduled for 03/04/2023   PATIENT SURVEYS:  FOTO 73/79  SCREENING FOR RED FLAGS: Bowel or bladder incontinence: No Spinal tumors: No Cauda equina syndrome: No  COGNITION: Overall cognitive status: Within functional limits for tasks assessed     SENSATION: UQNS: No sensation differences between R/L  LQNS: No sensation differences between R/L. Neuropathy of bilat feet noted 2/2 diabetes.   MUSCLE LENGTH: Gastroc-soleus complex tightness bilat. Not formally measured.   POSTURE: No Significant postural limitations  PALPATION: No TTP noted.    CERVICAL AROM: Cervical screen all AROM WNL grossly, no increased pain w/ movement. No indication to further test AROM.   LOWER EXTREMITY ROM:     Active  Right eval Left eval Right  03/02/23 Right  AAROM 10/17  Hip flexion      Hip extension      Hip abduction      Hip adduction      Hip internal rotation      Hip external rotation      Knee flexion      Knee extension      Ankle dorsiflexion 9 30 12 30   Ankle plantarflexion [facility] WFL    Ankle inversion [facility] WFL    Ankle eversion WFL WFL     (Blank rows = not tested)  LOWER EXTREMITY MMT:    MMT Right eval Left eval  Hip flexion 4+ 4  Hip extension    Hip abduction    Hip adduction    Hip internal rotation 4+ 4+  Hip external rotation 4+ 4+  Knee flexion 4+ 4+  Knee extension 4+ 4+  Ankle dorsiflexion 2 4+  Ankle plantarflexion 4+ 4+  Ankle inversion 4+ 4+  Ankle  eversion 4+ 4+   (Blank rows = not tested)  ANKLE JOINT MOBILITY: Mild hypomobility A/P noted w/ talocrural mobilization R >L, no concordant pain noted.  LUMBAR SPECIAL TESTS:  FABER test: Negative  FUNCTIONAL TESTS:    FGA: 22/30 (please see flowsheets)     SLS: RLE: 5.5 sec, LLE: 10 sec  TODAY'S TREATMENT: DATE: 05/13/23   There- ex:  Recumbent bike lvl 4.0 x for LE strengthening and improving R ankle ROM Lateral banded walks w/ GTB 3 x10' down and back, CGA 2/2 balance deficits  Standing mini squats onto elevated plinth 2 x10 w/ 3KG  Hip abduction at MATRIX machine RLE/LLE 40# 2x10/ each side Hip extension at MATRIX machine RLE/ LLE 85# 2 x10/ each side  Neuro Re-Ed: Obstacle course emphasizing obstacle clearance over 6" obstacle (3), static stance on airex pad, tandem stance and lateral stepping on airex beam; CGA.  Upside down BOSU ball PF/DF, side to side w/ bilat UE support 2 x1 minute/ each direction. CGA.   PATIENT EDUCATION:  Education details: HEP Person educated: Patient Education method: Explanation, Facilities manager, and Handouts Education comprehension: verbalized understanding  HOME EXERCISE PROGRAM: Access Code: [email or web address] URL: [email or web address] Date: 03/23/2023 Prepared by: Ronnie Derby  Exercises - Seated Ankle Circles  - 1 x daily - 7 x weekly - 2 sets - 15 reps - Standing Gastroc Stretch at Counter  - 1 x daily - 7 x weekly - 1 sets - 3 reps - 30 hold - Long Sitting Calf Stretch with Strap  - 1 x daily - 7 x weekly - 3 sets - 10 reps - Standing March with Counter Support  - 1 x daily - 7 x weekly - 3 sets - 8 reps - Heel Toe Raises with Counter Support  - 3-4 x weekly - 2 sets - 8-10 reps - Mini Squat with Counter Support  - 3-4 x weekly - 3 sets - 12 reps - Standing Balance with Eyes Closed  - 1 x daily - 7 x weekly - 1 sets - 3 reps - 30 hold - Standing Tandem Balance with Counter Support  - 1 x daily - 7 x weekly - 1  sets - 2 reps - 30 hold - Standing Single Leg Stance with Counter Support  - 1 x daily - 7 x weekly - 1 sets - 3 reps - 30 hold  ASSESSMENT:  CLINICAL IMPRESSION:  Session focused on progressing bilat LE strengthening and balance exercises. Pt notes moderate improvement in bilat LE strength displayed with improved activity tolerance to exercises at today's session including the MATRIX machine and with mini-squats. Pt continues to note balance deficits displayed with difficulty with obstacle clearance and completing tandem walking on airex beam pad. However, pt self reports reduced balance deficits outside of PT in his day- to- day life. Pt would continue to benefit from skilled PT interventions to address pt's remaining LE strength and balance deficits to improve QoL and return to PLOF.   OBJECTIVE IMPAIRMENTS: decreased mobility, decreased ROM, decreased strength, and hypomobility.   ACTIVITY LIMITATIONS: carrying heavy objects  PARTICIPATION LIMITATIONS: N/A  PERSONAL FACTORS: Age, Time since onset of injury/illness/exacerbation, and 3+ comorbidities: CKD, HTN, Type 2 diabetes, lymphedema  are also affecting patient's functional outcome.   REHAB POTENTIAL: Good  CLINICAL DECISION MAKING: Stable/uncomplicated  EVALUATION COMPLEXITY: Low   GOALS: Goals reviewed with patient? Yes  SHORT TERM GOALS: Target date: 03/25/2023  Pt will be IND with HEP to increase R LE functional mobility in ankle DF.  Baseline: HEP given to pt 02/25/23 04/08/23: HEP compliant  Goal status: MET   LONG TERM GOALS: Target date: 05/20/2023  Pt  will improve FOTO to target score to demonstrate clinically significant improvement in functional mobility. Baseline: 02/25/2023: 73/79 04/08/23: 78/79 05/06/23: 85/79 Goal status: MET  2.  Pt will improve FGA score by 4 points to display clinically significant improvement in balance.  Baseline: 02/25/2023: 22/30 04/08/23: 21/30 05/06/23: 24/30  Goal status:  ONGOING  3.  Pt will improve R LE DF AROM to 30 deg (equal to L LE DF) to enhance toe clearance ability and reduce risk of falls.   Baseline: 02/25/2023: 9 deg. 04/08/23: 15 deg.05/06/23: 30 deg (from natural PF position), 5 deg from neutral position  Goal status: MET   4. Pt will improve 30 second STS to 16 to display clinical significance in bilat LE strength, equal to his age matched norms.  Baseline: 04/08/2023: 11.5 05/06/23: 16 Goal status: MET    5. Pt will improve LLE SLS ability to at least 17 seconds to demonstrate significant balance improvements and be equal to the LLE SLS.  05/06/23: SLS Left leg- 17.89 seconds, Right leg- 6.47 seconds   PLAN:  PT FREQUENCY: 1x/week  PT DURATION: 6 weeks  PLANNED INTERVENTIONS: Therapeutic exercises, Therapeutic activity, Neuromuscular re-education, Balance training, Gait training, Patient/Family education, Self Care, Joint mobilization, Joint manipulation, Stair training, Dry Needling, Electrical stimulation, Spinal manipulation, Spinal mobilization, Cryotherapy, and Moist heat.   PLAN FOR NEXT SESSION: RECERT or D/C, Progress bilat LE strengthening and balance exercises, update HEP to emphasize balance exercises  Lovie Macadamia, SPT   Delphia Grates. Fairly IV, PT, DPT Physical Therapist- [place]  [facility]  05/13/23, 11:59 AM

## 2023-05-14 DIAGNOSIS — D509 Iron deficiency anemia, unspecified: Secondary | ICD-10-CM | POA: Diagnosis not present

## 2023-05-14 DIAGNOSIS — Z992 Dependence on renal dialysis: Secondary | ICD-10-CM | POA: Diagnosis not present

## 2023-05-14 DIAGNOSIS — N186 End stage renal disease: Secondary | ICD-10-CM | POA: Diagnosis not present

## 2023-05-14 DIAGNOSIS — D631 Anemia in chronic kidney disease: Secondary | ICD-10-CM | POA: Diagnosis not present

## 2023-05-14 DIAGNOSIS — N2581 Secondary hyperparathyroidism of renal origin: Secondary | ICD-10-CM | POA: Diagnosis not present

## 2023-05-17 DIAGNOSIS — Z992 Dependence on renal dialysis: Secondary | ICD-10-CM | POA: Diagnosis not present

## 2023-05-17 DIAGNOSIS — D509 Iron deficiency anemia, unspecified: Secondary | ICD-10-CM | POA: Diagnosis not present

## 2023-05-17 DIAGNOSIS — N2581 Secondary hyperparathyroidism of renal origin: Secondary | ICD-10-CM | POA: Diagnosis not present

## 2023-05-17 DIAGNOSIS — D631 Anemia in chronic kidney disease: Secondary | ICD-10-CM | POA: Diagnosis not present

## 2023-05-17 DIAGNOSIS — N186 End stage renal disease: Secondary | ICD-10-CM | POA: Diagnosis not present

## 2023-05-18 DIAGNOSIS — M21371 Foot drop, right foot: Secondary | ICD-10-CM | POA: Diagnosis not present

## 2023-05-18 DIAGNOSIS — M5126 Other intervertebral disc displacement, lumbar region: Secondary | ICD-10-CM | POA: Diagnosis not present

## 2023-05-18 DIAGNOSIS — M5416 Radiculopathy, lumbar region: Secondary | ICD-10-CM | POA: Diagnosis not present

## 2023-05-18 DIAGNOSIS — H1131 Conjunctival hemorrhage, right eye: Secondary | ICD-10-CM | POA: Diagnosis not present

## 2023-05-19 DIAGNOSIS — N2581 Secondary hyperparathyroidism of renal origin: Secondary | ICD-10-CM | POA: Diagnosis not present

## 2023-05-19 DIAGNOSIS — D509 Iron deficiency anemia, unspecified: Secondary | ICD-10-CM | POA: Diagnosis not present

## 2023-05-19 DIAGNOSIS — D631 Anemia in chronic kidney disease: Secondary | ICD-10-CM | POA: Diagnosis not present

## 2023-05-19 DIAGNOSIS — Z992 Dependence on renal dialysis: Secondary | ICD-10-CM | POA: Diagnosis not present

## 2023-05-19 DIAGNOSIS — N186 End stage renal disease: Secondary | ICD-10-CM | POA: Diagnosis not present

## 2023-05-20 ENCOUNTER — Ambulatory Visit: Payer: Medicare Other

## 2023-05-20 DIAGNOSIS — M6281 Muscle weakness (generalized): Secondary | ICD-10-CM | POA: Diagnosis not present

## 2023-05-20 DIAGNOSIS — M21371 Foot drop, right foot: Secondary | ICD-10-CM

## 2023-05-20 DIAGNOSIS — Q612 Polycystic kidney, adult type: Secondary | ICD-10-CM | POA: Diagnosis not present

## 2023-05-20 DIAGNOSIS — N186 End stage renal disease: Secondary | ICD-10-CM | POA: Diagnosis not present

## 2023-05-20 DIAGNOSIS — Z992 Dependence on renal dialysis: Secondary | ICD-10-CM | POA: Diagnosis not present

## 2023-05-20 NOTE — Therapy (Addendum)
OUTPATIENT PHYSICAL THERAPY THORACOLUMBAR TREATMENT RE-CERTIFICATION  Patient Name: Mark Bauer MRN: [id number] DOB:05-02-1956, 67 y.o., male Today's Date: 05/20/2023  END OF SESSION:  PT End of Session - 05/20/23 0945     Visit Number 12    Number of Visits 13    Date for PT Re-Evaluation 06/17/23    PT Start Time 0945    PT Stop Time 1028    PT Time Calculation (min) 43 min    Equipment Utilized During Treatment Gait belt    Activity Tolerance Patient tolerated treatment well              Past Medical History:  Diagnosis Date   Allergy    Bilateral undescended testicles    Cancer (HCC)    melanoma on forehead   Cataract    OU   Diabetes mellitus without complication (HCC)    Dysrhythmia    a fib   GERD (gastroesophageal reflux disease)    Gout    Hepatitis    polycystic    Hypertension    Hypokalemia    Mixed hyperlipidemia    Polycystic kidney    Polycystic liver disease    Vitamin D deficiency    Past Surgical History:  Procedure Laterality Date   A/V FISTULAGRAM Left 12/15/2022   Procedure: A/V Fistulagram;  Surgeon: Renford Dills, MD;  Location: [facility];  Service: Cardiovascular;  Laterality: Left;   BASCILIC VEIN TRANSPOSITION Left 02/23/2020   Procedure: BASCILIC VEIN TRANSPOSITION (SINGLE STAGE);  Surgeon: Renford Dills, MD;  Location: [facility];  Service: Vascular;  Laterality: Left;   COLONOSCOPY  2012   hemorrhoids   melanoma removal     forehead   Patient Active Problem List   Diagnosis Date Noted   Right foot drop 01/05/2023   End stage renal disease (HCC) 06/04/2021   Anticoagulated 05/12/2021   Complication of vascular access for dialysis 06/06/2020   Fistula 02/23/2020   Polycystic liver disease 01/10/2020   Polycystic kidney disease 03/01/2019   Metabolic acidosis 09/13/2018   CKD stage 4 due to type 2 diabetes mellitus (HCC) 01/25/2018   Type 2 diabetes mellitus with diabetic nephropathy, without long-term  current use of insulin (HCC) 01/25/2018   Secondary hyperparathyroidism of renal origin (HCC) 08/19/2017   Chronic atrial fibrillation (HCC) 01/06/2017   Essential hypertension 01/06/2017   Hyperuricemia 01/06/2017   Mixed hyperlipidemia 01/06/2017   Gastroesophageal reflux disease 01/06/2017   Obesity 12/26/2012    PCP: Duanne Limerick, MD  REFERRING PROVIDER: Bridgette Habermann, PA-C  REFERRING DIAG: [phone] (ICD-10-CM) - Right foot drop   Rationale for Evaluation and Treatment: Rehabilitation  THERAPY DIAG:  Foot drop, right  Muscle weakness (generalized)  ONSET DATE: June, 2024  SUBJECTIVE:  SUBJECTIVE STATEMENT: Pt reports no pain in the RLE at today's session. No notable changes since last session.   PERTINENT HISTORY:  Patient presenting with foot drop which began 1.5 months ago. States this began suddenly. States that he went to bed and woke up with symptoms. Symptoms came on abruptly. No known trigger. No recent injuries. States over the past month and a half symptoms of gotten about "3% better". He is able to very slightly dorsiflex the right foot. States that he has adjusted his walking to compensate for this. Walking without assistance. No recent falls. He feels generally steady. Does report numbness and tingling in bilateral lower extremities. States that this began about 6 to 8 months ago after being diagnosed with peripheral neuropathy. This is limited to the feet. It is constant. Can be worse in the evenings. Pt denies radicular pain or sensory changes. Does have prior history of low back issues including herniated disc.  PAIN:  Are you having pain? Yes: NPRS scale: 0/10 Pain location: No pain, neuropathy related pain at dorsum of bilat feet 5-6/10. Pain description: N/A Aggravating  factors: N/A Relieving factors: Medications (paragaba)  PRECAUTIONS: None  RED FLAGS: Bowel or bladder incontinence: No and Spinal tumors: No   WEIGHT BEARING RESTRICTIONS: No  FALLS:  Has patient fallen in last 6 months? No and "stumbles"  OCCUPATION: Retired, Art gallery manager   PLOF: Independent  PATIENT GOALS: "Improve the foot drop"  NEXT MD VISIT: September, 2024 (Neurologist)   OBJECTIVE:   DIAGNOSTIC FINDINGS:  MRI of the brain w/o contrast, and lumbar spine scheduled for 03/04/2023   PATIENT SURVEYS:  FOTO 73/79  SCREENING FOR RED FLAGS: Bowel or bladder incontinence: No Spinal tumors: No Cauda equina syndrome: No  COGNITION: Overall cognitive status: Within functional limits for tasks assessed     SENSATION: UQNS: No sensation differences between R/L  LQNS: No sensation differences between R/L. Neuropathy of bilat feet noted 2/2 diabetes.   MUSCLE LENGTH: Gastroc-soleus complex tightness bilat. Not formally measured.   POSTURE: No Significant postural limitations  PALPATION: No TTP noted.    CERVICAL AROM: Cervical screen all AROM WNL grossly, no increased pain w/ movement. No indication to further test AROM.   LOWER EXTREMITY ROM:     Active  Right eval Left eval Right  03/02/23 Right  AAROM 10/17  Hip flexion      Hip extension      Hip abduction      Hip adduction      Hip internal rotation      Hip external rotation      Knee flexion      Knee extension      Ankle dorsiflexion 9 30 12 30   Ankle plantarflexion [facility] WFL    Ankle inversion [facility] WFL    Ankle eversion WFL WFL     (Blank rows = not tested)  LOWER EXTREMITY MMT:    MMT Right eval Left eval  Hip flexion 4+ 4  Hip extension    Hip abduction    Hip adduction    Hip internal rotation 4+ 4+  Hip external rotation 4+ 4+  Knee flexion 4+ 4+  Knee extension 4+ 4+  Ankle dorsiflexion 2 4+  Ankle plantarflexion 4+ 4+  Ankle inversion 4+ 4+  Ankle eversion 4+ 4+   (Blank rows  = not tested)  ANKLE JOINT MOBILITY: Mild hypomobility A/P noted w/ talocrural mobilization R >L, no concordant pain noted.   LUMBAR SPECIAL TESTS:  FABER test: Negative  FUNCTIONAL TESTS:    FGA: 22/30 (please see flowsheets)     SLS: RLE: 5.5 sec, LLE: 10 sec  TODAY'S TREATMENT: DATE: 05/20/23   Beginning of session spent reassessing pt's goals and POC to complete re-cert. (See below)    There- ex:  Recumbent bike lvl 4.0 x for LE strengthening and improving R ankle ROM Leg press bilat LE's 55# 3 x10  Knee extensions bilat LE's 3x 10 20#  Knee flexion bilat LE's 3 x10 35# Standing alternating lunges w/ 5# DB's in bilat UE"s  Tandem stance w/ no UE support RLE/LLE 2 x30 sec   PATIENT EDUCATION:  Education details: HEP Person educated: Patient Education method: Explanation, Facilities manager, and Handouts Education comprehension: verbalized understanding  HOME EXERCISE PROGRAM: Access Code: [email or web address] URL: [email or web address] Date: 03/23/2023 Prepared by: Ronnie Derby  Exercises - Seated Ankle Circles  - 1 x daily - 7 x weekly - 2 sets - 15 reps - Standing Gastroc Stretch at Counter  - 1 x daily - 7 x weekly - 1 sets - 3 reps - 30 hold - Long Sitting Calf Stretch with Strap  - 1 x daily - 7 x weekly - 3 sets - 10 reps - Standing March with Counter Support  - 1 x daily - 7 x weekly - 3 sets - 8 reps - Heel Toe Raises with Counter Support  - 3-4 x weekly - 2 sets - 8-10 reps - Mini Squat with Counter Support  - 3-4 x weekly - 3 sets - 12 reps - Standing Balance with Eyes Closed  - 1 x daily - 7 x weekly - 1 sets - 3 reps - 30 hold - Standing Tandem Balance with Counter Support  - 1 x daily - 7 x weekly - 1 sets - 2 reps - 30 hold - Standing Single Leg Stance with Counter Support  - 1 x daily - 7 x weekly - 1 sets - 3 reps - 30 hold  ASSESSMENT:  CLINICAL IMPRESSION:  Session focused on reassessing pt's STG and LTG's as pt requiring  re-certification at today's session. Pt notes achieving 4/6 of his STG and LTG's including exceeding his target FOTO score, improving his R ankle DF AROM, improving his 30 second STS, and being HEP compliant. Pt notes a sustained score for his FGA, and improved time on his R LE SLS ability. Pt continues to display mild deficits with obstacle negotiation, and R SLS ability noted w/ tasks completed at today's session. PT and pt agree that pt would continue to benefit from skilled PT interventions to address remaining balance and LE weakness deficits to improve QoL and return to PLOF.    OBJECTIVE IMPAIRMENTS: decreased mobility, decreased ROM, decreased strength, and hypomobility.   ACTIVITY LIMITATIONS: carrying heavy objects  PARTICIPATION LIMITATIONS: N/A  PERSONAL FACTORS: Age, Time since onset of injury/illness/exacerbation, and 3+ comorbidities: CKD, HTN, Type 2 diabetes, lymphedema  are also affecting patient's functional outcome.   REHAB POTENTIAL: Good  CLINICAL DECISION MAKING: Stable/uncomplicated  EVALUATION COMPLEXITY: Low   GOALS: Goals reviewed with patient? Yes  SHORT TERM GOALS: Target date: 03/25/2023  Pt will be IND with HEP to increase R LE functional mobility in ankle DF.  Baseline: HEP given to pt 02/25/23 04/08/23: HEP compliant  Goal status: MET   LONG TERM GOALS: Target date: 06/17/2023  Pt will improve FOTO to target score to demonstrate clinically significant improvement in functional mobility. Baseline: 02/25/2023: 73/79 04/08/23: 78/79 05/06/23: 85/79 Goal status: MET  2.  Pt will improve FGA score by 4 points to display clinically significant improvement in balance.  Baseline: 02/25/2023: 22/30 04/08/23: 21/30 05/06/23: 24/30  Goal status: ONGOING  3.  Pt will improve R LE DF AROM to 30 deg (equal to L LE DF) to enhance toe clearance ability and reduce risk of falls.   Baseline: 02/25/2023: 9 deg. 04/08/23: 15 deg.05/06/23: 30 deg (from natural PF  position), 5 deg from neutral position  Goal status: MET   4. Pt will improve 30 second STS to 16 to display clinical significance in bilat LE strength, equal to his age matched norms.  Baseline: 04/08/2023: 11.5 05/06/23: 16 Goal status: MET    5. Pt will improve LLE SLS ability to at least 17 seconds to demonstrate significant balance improvements and be equal to the LLE SLS.  Baseline: 05/06/23: SLS Left leg- 17.89 seconds, Right leg- 6.47 seconds 05/20/23: R leg- 14.38 Goal status: ONGOING  PLAN:  PT FREQUENCY: 1x/week  PT DURATION: 4 weeks  PLANNED INTERVENTIONS: Therapeutic exercises, Therapeutic activity, Neuromuscular re-education, Balance training, Gait training, Patient/Family education, Self Care, Joint mobilization, Joint manipulation, Stair training, Dry Needling, Electrical stimulation, Spinal manipulation, Spinal mobilization, Cryotherapy, and Moist heat.   PLAN FOR NEXT SESSION: Progress bilat LE strengthening and balance exercises  Lovie Macadamia, SPT   Delphia Grates. Fairly IV, PT, DPT Physical Therapist- [facility]  05/20/23, 1:09 PM

## 2023-05-21 DIAGNOSIS — Z992 Dependence on renal dialysis: Secondary | ICD-10-CM | POA: Diagnosis not present

## 2023-05-21 DIAGNOSIS — N2581 Secondary hyperparathyroidism of renal origin: Secondary | ICD-10-CM | POA: Diagnosis not present

## 2023-05-21 DIAGNOSIS — N186 End stage renal disease: Secondary | ICD-10-CM | POA: Diagnosis not present

## 2023-05-21 DIAGNOSIS — D509 Iron deficiency anemia, unspecified: Secondary | ICD-10-CM | POA: Diagnosis not present

## 2023-05-21 DIAGNOSIS — D631 Anemia in chronic kidney disease: Secondary | ICD-10-CM | POA: Diagnosis not present

## 2023-05-24 DIAGNOSIS — D631 Anemia in chronic kidney disease: Secondary | ICD-10-CM | POA: Diagnosis not present

## 2023-05-24 DIAGNOSIS — Z992 Dependence on renal dialysis: Secondary | ICD-10-CM | POA: Diagnosis not present

## 2023-05-24 DIAGNOSIS — N186 End stage renal disease: Secondary | ICD-10-CM | POA: Diagnosis not present

## 2023-05-24 DIAGNOSIS — D509 Iron deficiency anemia, unspecified: Secondary | ICD-10-CM | POA: Diagnosis not present

## 2023-05-24 DIAGNOSIS — N2581 Secondary hyperparathyroidism of renal origin: Secondary | ICD-10-CM | POA: Diagnosis not present

## 2023-05-26 DIAGNOSIS — Z992 Dependence on renal dialysis: Secondary | ICD-10-CM | POA: Diagnosis not present

## 2023-05-26 DIAGNOSIS — D631 Anemia in chronic kidney disease: Secondary | ICD-10-CM | POA: Diagnosis not present

## 2023-05-26 DIAGNOSIS — D509 Iron deficiency anemia, unspecified: Secondary | ICD-10-CM | POA: Diagnosis not present

## 2023-05-26 DIAGNOSIS — N2581 Secondary hyperparathyroidism of renal origin: Secondary | ICD-10-CM | POA: Diagnosis not present

## 2023-05-26 DIAGNOSIS — N186 End stage renal disease: Secondary | ICD-10-CM | POA: Diagnosis not present

## 2023-05-26 NOTE — Progress Notes (Signed)
MRN : [id number]  Mark Bauer is a 67 y.o. (January 29, 1956) male who presents with chief complaint of check access.  History of Present Illness:   The patient returns to the office for follow up regarding a problem with their dialysis access.   The patient notes they were pulling clots recently but that appears to been an isolated episode and since the dialysis center sent over the information to make the appointment his fistula has been working absolutely flawlessly and there have been no complaints.  The patient denies hand pain or other symptoms consistent with steal phenomena.  No significant arm swelling.  The patient denies redness or swelling at the access site. The patient denies fever or chills at home or while on dialysis.  No recent shortening of the patient's walking distance or new symptoms consistent with claudication.  No history of rest pain symptoms. No new ulcers or wounds of the lower extremities have occurred.  The patient denies amaurosis fugax or recent TIA symptoms. There are no recent neurological changes noted. There is no history of DVT, PE or superficial thrombophlebitis. No recent episodes of angina or shortness of breath documented.    No outpatient medications have been marked as taking for the 05/27/23 encounter (Appointment) with Gilda Crease, Latina Craver, MD.    Past Medical History:  Diagnosis Date   Allergy    Bilateral undescended testicles    Cancer (HCC)    melanoma on forehead   Cataract    OU   Diabetes mellitus without complication (HCC)    Dysrhythmia    a fib   GERD (gastroesophageal reflux disease)    Gout    Hepatitis    polycystic    Hypertension    Hypokalemia    Mixed hyperlipidemia    Polycystic kidney    Polycystic liver disease    Vitamin D deficiency     Past Surgical History:  Procedure Laterality Date   A/V FISTULAGRAM Left 12/15/2022   Procedure: A/V Fistulagram;  Surgeon: Renford Dills, MD;   Location: [facility];  Service: Cardiovascular;  Laterality: Left;   BASCILIC VEIN TRANSPOSITION Left 02/23/2020   Procedure: BASCILIC VEIN TRANSPOSITION (SINGLE STAGE);  Surgeon: Renford Dills, MD;  Location: [facility];  Service: Vascular;  Laterality: Left;   COLONOSCOPY  2012   hemorrhoids   melanoma removal     forehead    Social History Social History   Tobacco Use   Smoking status: Never   Smokeless tobacco: Never  Vaping Use   Vaping status: Never Used  Substance Use Topics   Alcohol use: No   Drug use: No    Family History Family History  Problem Relation Age of Onset   Diabetes Mother    Heart disease Father     Allergies  Allergen Reactions   Ciprofloxacin Other (See Comments)    hallucination  Other Reaction(s): Unknown   Pollen Extract Other (See Comments)    Sneezing, watery eyes, etc.  Other Reaction(s): Unknown   Caffeine Palpitations    palpatations     REVIEW OF SYSTEMS (Negative unless checked)  Constitutional: [] Weight loss  [] Fever  [] Chills Cardiac: [] Chest pain   [] Chest pressure   [] Palpitations   [] Shortness of breath when laying flat   [] Shortness of breath with exertion. Vascular:  [] Pain in legs with walking   [] Pain in legs at  rest  [] History of DVT   [] Phlebitis   [] Swelling in legs   [] Varicose veins   [] Non-healing ulcers Pulmonary:   [] Uses home oxygen   [] Productive cough   [] Hemoptysis   [] Wheeze  [] COPD   [] Asthma Neurologic:  [] Dizziness   [] Seizures   [] History of stroke   [] History of TIA  [] Aphasia   [] Vissual changes   [] Weakness or numbness in arm   [] Weakness or numbness in leg Musculoskeletal:   [] Joint swelling   [] Joint pain   [] Low back pain Hematologic:  [] Easy bruising  [] Easy bleeding   [] Hypercoagulable state   [] Anemic Gastrointestinal:  [] Diarrhea   [] Vomiting  [] Gastroesophageal reflux/heartburn   [] Difficulty swallowing. Genitourinary:  [x] Chronic kidney disease   [] Difficult urination  [] Frequent  urination   [] Blood in urine Skin:  [] Rashes   [] Ulcers  Psychological:  [] History of anxiety   []  History of major depression.  Physical Examination  There were no vitals filed for this visit. There is no height or weight on file to calculate BMI. Gen: WD/WN, NAD Head: Roland/AT, No temporalis wasting.  Ear/Nose/Throat: Hearing grossly intact, nares w/o erythema or drainage Eyes: PER, EOMI, sclera nonicteric.  Neck: Supple, no gross masses or lesions.  No JVD.  Pulmonary:  Good air movement, no audible wheezing, no use of accessory muscles.  Cardiac: RRR, precordium non-hyperdynamic. Vascular:   Left brachiobasilic fistula good thrill good bruit skin appears healthy and intact Vessel Right Left  Radial Palpable Palpable  Brachial Palpable Palpable  Gastrointestinal: soft, non-distended. No guarding/no peritoneal signs.  Musculoskeletal: M/S 5/5 throughout.  No deformity.  Neurologic: CN 2-12 intact. Pain and light touch intact in extremities.  Symmetrical.  Speech is fluent. Motor exam as listed above. Psychiatric: Judgment intact, Mood & affect appropriate for pt's clinical situation. Dermatologic: No rashes or ulcers noted.  No changes consistent with cellulitis.   CBC Lab Results  Component Value Date   WBC 5.9 09/12/2021   HGB 10.0 (L) 09/12/2021   HCT 30.8 (L) 09/12/2021   MCV 103 (H) 09/12/2021   PLT 119 (L) 09/12/2021    BMET    Component Value Date/Time   NA 144 09/14/2022 1120   K 5.0 09/14/2022 1120   CL 104 09/14/2022 1120   CO2 19 (L) 09/14/2022 1120   GLUCOSE 91 09/14/2022 1120   GLUCOSE 109 (H) 02/16/2020 1434   BUN 101 (HH) 09/14/2022 1120   CREATININE 9.53 (H) 09/14/2022 1120   CALCIUM 10.4 (H) 09/14/2022 1120   GFRNONAA 8 (L) 02/16/2020 1434   GFRAA 9 (L) 02/16/2020 1434   CrCl cannot be calculated (Patient's most recent lab result is older than the maximum 21 days allowed.).  COAG Lab Results  Component Value Date   INR 1.1 05/12/2021   INR 1.2  04/16/2021   INR 2.6 (H) 04/11/2021    Radiology No results found.   Assessment/Plan 1. End stage renal disease (HCC) Recommend:  The patient is doing well and currently has adequate dialysis access. The patient's dialysis center is not reporting any access issues. Flow pattern is stable when compared to the prior ultrasound.  The patient should have a duplex ultrasound of the dialysis access in 6 months. The patient will follow-up with me in the office after each ultrasound   - VAS US DUPLEX DIALYSIS ACCESS (AVF, AVG); Future  2. Chronic atrial fibrillation (HCC) Continue antiarrhythmia medications as already ordered, these medications have been reviewed and there are no changes at this time.  Continue anticoagulation  as ordered by Cardiology Service  3. Essential hypertension Continue antihypertensive medications as already ordered, these medications have been reviewed and there are no changes at this time.  4. Gastroesophageal reflux disease, unspecified whether esophagitis present Continue PPI as already ordered, this medication has been reviewed and there are no changes at this time.  Avoidence of caffeine and alcohol  Moderate elevation of the head of the bed   5. Type 2 diabetes mellitus with diabetic nephropathy, without long-term current use of insulin (HCC) Continue hypoglycemic medications as already ordered, these medications have been reviewed and there are no changes at this time.  Hgb A1C to be monitored as already arranged by primary service    Levora Dredge, MD  05/26/2023 8:57 AM

## 2023-05-27 ENCOUNTER — Ambulatory Visit (INDEPENDENT_AMBULATORY_CARE_PROVIDER_SITE_OTHER): Payer: Medicare Other | Admitting: Vascular Surgery

## 2023-05-27 ENCOUNTER — Encounter (INDEPENDENT_AMBULATORY_CARE_PROVIDER_SITE_OTHER): Payer: Self-pay | Admitting: Vascular Surgery

## 2023-05-27 ENCOUNTER — Ambulatory Visit: Payer: Medicare Other | Attending: Physician Assistant

## 2023-05-27 ENCOUNTER — Other Ambulatory Visit: Payer: Self-pay | Admitting: Family Medicine

## 2023-05-27 VITALS — BP 93/63 | HR 97 | Resp 16 | Wt 158.2 lb

## 2023-05-27 DIAGNOSIS — N186 End stage renal disease: Secondary | ICD-10-CM | POA: Diagnosis not present

## 2023-05-27 DIAGNOSIS — I482 Chronic atrial fibrillation, unspecified: Secondary | ICD-10-CM | POA: Diagnosis not present

## 2023-05-27 DIAGNOSIS — M6281 Muscle weakness (generalized): Secondary | ICD-10-CM | POA: Insufficient documentation

## 2023-05-27 DIAGNOSIS — K219 Gastro-esophageal reflux disease without esophagitis: Secondary | ICD-10-CM | POA: Diagnosis not present

## 2023-05-27 DIAGNOSIS — I10 Essential (primary) hypertension: Secondary | ICD-10-CM

## 2023-05-27 DIAGNOSIS — M21371 Foot drop, right foot: Secondary | ICD-10-CM | POA: Insufficient documentation

## 2023-05-27 DIAGNOSIS — E1121 Type 2 diabetes mellitus with diabetic nephropathy: Secondary | ICD-10-CM | POA: Diagnosis not present

## 2023-05-27 DIAGNOSIS — E790 Hyperuricemia without signs of inflammatory arthritis and tophaceous disease: Secondary | ICD-10-CM

## 2023-05-27 NOTE — Therapy (Addendum)
OUTPATIENT PHYSICAL THERAPY THORACOLUMBAR TREATMENT   Patient Name: Mark Bauer MRN: [id number] DOB:09/14/1955, 67 y.o., male Today's Date: 05/27/2023  END OF SESSION:  PT End of Session - 05/27/23 0945     Visit Number 13    Number of Visits 14    Date for PT Re-Evaluation 06/17/23    PT Start Time 0945    PT Stop Time 1028    PT Time Calculation (min) 43 min    Equipment Utilized During Treatment Gait belt    Activity Tolerance Patient tolerated treatment well              Past Medical History:  Diagnosis Date   Allergy    Bilateral undescended testicles    Cancer (HCC)    melanoma on forehead   Cataract    OU   Diabetes mellitus without complication (HCC)    Dysrhythmia    a fib   GERD (gastroesophageal reflux disease)    Gout    Hepatitis    polycystic    Hypertension    Hypokalemia    Mixed hyperlipidemia    Polycystic kidney    Polycystic liver disease    Vitamin D deficiency    Past Surgical History:  Procedure Laterality Date   A/V FISTULAGRAM Left 12/15/2022   Procedure: A/V Fistulagram;  Surgeon: Renford Dills, MD;  Location: [facility];  Service: Cardiovascular;  Laterality: Left;   BASCILIC VEIN TRANSPOSITION Left 02/23/2020   Procedure: BASCILIC VEIN TRANSPOSITION (SINGLE STAGE);  Surgeon: Renford Dills, MD;  Location: [facility];  Service: Vascular;  Laterality: Left;   COLONOSCOPY  2012   hemorrhoids   melanoma removal     forehead   Patient Active Problem List   Diagnosis Date Noted   Right foot drop 01/05/2023   End stage renal disease (HCC) 06/04/2021   Anticoagulated 05/12/2021   Complication of vascular access for dialysis 06/06/2020   Fistula 02/23/2020   Polycystic liver disease 01/10/2020   Polycystic kidney disease 03/01/2019   Metabolic acidosis 09/13/2018   CKD stage 4 due to type 2 diabetes mellitus (HCC) 01/25/2018   Type 2 diabetes mellitus with diabetic nephropathy, without long-term current use of  insulin (HCC) 01/25/2018   Secondary hyperparathyroidism of renal origin (HCC) 08/19/2017   Chronic atrial fibrillation (HCC) 01/06/2017   Essential hypertension 01/06/2017   Hyperuricemia 01/06/2017   Mixed hyperlipidemia 01/06/2017   Gastroesophageal reflux disease 01/06/2017   Obesity 12/26/2012    PCP: Duanne Limerick, MD  REFERRING PROVIDER: Bridgette Habermann, PA-C  REFERRING DIAG: [phone] (ICD-10-CM) - Right foot drop   Rationale for Evaluation and Treatment: Rehabilitation  THERAPY DIAG:  Foot drop, right  Muscle weakness (generalized)  ONSET DATE: June, 2024  SUBJECTIVE:  SUBJECTIVE STATEMENT: Pt reports no pain in the RLE at today's session. No notable changes since last session.   PERTINENT HISTORY:  Patient presenting with foot drop which began 1.5 months ago. States this began suddenly. States that he went to bed and woke up with symptoms. Symptoms came on abruptly. No known trigger. No recent injuries. States over the past month and a half symptoms of gotten about "3% better". He is able to very slightly dorsiflex the right foot. States that he has adjusted his walking to compensate for this. Walking without assistance. No recent falls. He feels generally steady. Does report numbness and tingling in bilateral lower extremities. States that this began about 6 to 8 months ago after being diagnosed with peripheral neuropathy. This is limited to the feet. It is constant. Can be worse in the evenings. Pt denies radicular pain or sensory changes. Does have prior history of low back issues including herniated disc.  PAIN:  Are you having pain? Yes: NPRS scale: 0/10 Pain location: No pain, neuropathy related pain at dorsum of bilat feet 5-6/10. Pain description: N/A Aggravating factors:  N/A Relieving factors: Medications (paragaba)  PRECAUTIONS: None  RED FLAGS: Bowel or bladder incontinence: No and Spinal tumors: No   WEIGHT BEARING RESTRICTIONS: No  FALLS:  Has patient fallen in last 6 months? No and "stumbles"  OCCUPATION: Retired, Art gallery manager   PLOF: Independent  PATIENT GOALS: "Improve the foot drop"  NEXT MD VISIT: September, 2024 (Neurologist)   OBJECTIVE:   DIAGNOSTIC FINDINGS:  MRI of the brain w/o contrast, and lumbar spine scheduled for 03/04/2023   PATIENT SURVEYS:  FOTO 73/79  SCREENING FOR RED FLAGS: Bowel or bladder incontinence: No Spinal tumors: No Cauda equina syndrome: No  COGNITION: Overall cognitive status: Within functional limits for tasks assessed     SENSATION: UQNS: No sensation differences between R/L  LQNS: No sensation differences between R/L. Neuropathy of bilat feet noted 2/2 diabetes.   MUSCLE LENGTH: Gastroc-soleus complex tightness bilat. Not formally measured.   POSTURE: No Significant postural limitations  PALPATION: No TTP noted.    CERVICAL AROM: Cervical screen all AROM WNL grossly, no increased pain w/ movement. No indication to further test AROM.   LOWER EXTREMITY ROM:     Active  Right eval Left eval Right  03/02/23 Right  AAROM 10/17  Hip flexion      Hip extension      Hip abduction      Hip adduction      Hip internal rotation      Hip external rotation      Knee flexion      Knee extension      Ankle dorsiflexion 9 30 12 30   Ankle plantarflexion [facility] WFL    Ankle inversion [facility] WFL    Ankle eversion WFL WFL     (Blank rows = not tested)  LOWER EXTREMITY MMT:    MMT Right eval Left eval  Hip flexion 4+ 4  Hip extension    Hip abduction    Hip adduction    Hip internal rotation 4+ 4+  Hip external rotation 4+ 4+  Knee flexion 4+ 4+  Knee extension 4+ 4+  Ankle dorsiflexion 2 4+  Ankle plantarflexion 4+ 4+  Ankle inversion 4+ 4+  Ankle eversion 4+ 4+   (Blank rows = not  tested)  ANKLE JOINT MOBILITY: Mild hypomobility A/P noted w/ talocrural mobilization R >L, no concordant pain noted.   LUMBAR SPECIAL TESTS:  FABER test: Negative  FUNCTIONAL TESTS:    FGA: 22/30 (please see flowsheets)     SLS: RLE: 5.5 sec, LLE: 10 sec  TODAY'S TREATMENT: DATE: 05/27/23   There- ex:  Recumbent bike lvl 4.0 x 5 minutes for LE strengthening and improving R ankle ROM Standing squats with 2 KG medicine ball RLE/LLE 2 x10/ each side Leg press bilat LE's 55# x10, 65# x10, 60# 2 x10   Obstacle course:  Emphasizing obstacle negotiation over (4) 6" obstacles, obstacle negotiation over 8" curb, tandem walking, and lateral stepping over airex beam pad 6 x 30' down and back; CGA- gait belt  Tandem stance w/ no UE support on airex pad RLE/LLE 2 x30 sec; CGA   PATIENT EDUCATION:  Education details: HEP Person educated: Patient Education method: Explanation, Demonstration, and Handouts Education comprehension: verbalized understanding  HOME EXERCISE PROGRAM: Access Code: [email or web address] URL: [email or web address] Date: 03/23/2023 Prepared by: Ronnie Derby  Exercises - Seated Ankle Circles  - 1 x daily - 7 x weekly - 2 sets - 15 reps - Standing Gastroc Stretch at Counter  - 1 x daily - 7 x weekly - 1 sets - 3 reps - 30 hold - Long Sitting Calf Stretch with Strap  - 1 x daily - 7 x weekly - 3 sets - 10 reps - Standing March with Counter Support  - 1 x daily - 7 x weekly - 3 sets - 8 reps - Heel Toe Raises with Counter Support  - 3-4 x weekly - 2 sets - 8-10 reps - Mini Squat with Counter Support  - 3-4 x weekly - 3 sets - 12 reps - Standing Balance with Eyes Closed  - 1 x daily - 7 x weekly - 1 sets - 3 reps - 30 hold - Standing Tandem Balance with Counter Support  - 1 x daily - 7 x weekly - 1 sets - 2 reps - 30 hold - Standing Single Leg Stance with Counter Support  - 1 x daily - 7 x weekly - 1 sets - 3 reps - 30 hold  ASSESSMENT:  CLINICAL  IMPRESSION:  Session focused on progressing bilat LE strengthening and balance activities. Pt demonstrated improvements in bilat LE strength, as evidenced by increased tolerance to exercise progressions, including increasing leg press #. Pt also showed progress with balance activities, although a forward fall occurred during tandem balance walking on an Airex balance beam CGA w/ gait belt donned. Pt tripped over his LE while positioning his foot into tandem, falling forward onto his hands and knees. SPT guarding was able to assist in bracing the fall, and pt reported no injuries or adverse symptoms following the incident. Normal knee and wrist AROM present, no seen swelling. Vitals were assessed post-fall, with BP 107/64 and HR 94, and pt will continue to benefit from skilled PT interventions to address remaining bilat LE strengthening and balance deficits.  OBJECTIVE IMPAIRMENTS: decreased mobility, decreased ROM, decreased strength, and hypomobility.   ACTIVITY LIMITATIONS: carrying heavy objects  PARTICIPATION LIMITATIONS: N/A  PERSONAL FACTORS: Age, Time since onset of injury/illness/exacerbation, and 3+ comorbidities: CKD, HTN, Type 2 diabetes, lymphedema  are also affecting patient's functional outcome.   REHAB POTENTIAL: Good  CLINICAL DECISION MAKING: Stable/uncomplicated  EVALUATION COMPLEXITY: Low   GOALS: Goals reviewed with patient? Yes  SHORT TERM GOALS: Target date: 03/25/2023  Pt will be IND with HEP to increase R LE functional mobility in ankle DF.  Baseline: HEP given to pt 02/25/23 04/08/23: HEP compliant  Goal status: MET  LONG TERM GOALS: Target date: 06/17/2023  Pt will improve FOTO to target score to demonstrate clinically significant improvement in functional mobility. Baseline: 02/25/2023: 73/79 04/08/23: 78/79 05/06/23: 85/79 Goal status: MET  2.  Pt will improve FGA score by 4 points to display clinically significant improvement in balance.  Baseline:  02/25/2023: 22/30 04/08/23: 21/30 05/06/23: 24/30  Goal status: ONGOING  3.  Pt will improve R LE DF AROM to 30 deg (equal to L LE DF) to enhance toe clearance ability and reduce risk of falls.   Baseline: 02/25/2023: 9 deg. 04/08/23: 15 deg.05/06/23: 30 deg (from natural PF position), 5 deg from neutral position  Goal status: MET   4. Pt will improve 30 second STS to 16 to display clinical significance in bilat LE strength, equal to his age matched norms.  Baseline: 04/08/2023: 11.5 05/06/23: 16 Goal status: MET    5. Pt will improve LLE SLS ability to at least 17 seconds to demonstrate significant balance improvements and be equal to the LLE SLS.  Baseline: 05/06/23: SLS Left leg- 17.89 seconds, Right leg- 6.47 seconds 05/20/23: R leg- 14.38 Goal status: ONGOING  PLAN:  PT FREQUENCY: 1x/week  PT DURATION: 4 weeks  PLANNED INTERVENTIONS: Therapeutic exercises, Therapeutic activity, Neuromuscular re-education, Balance training, Gait training, Patient/Family education, Self Care, Joint mobilization, Joint manipulation, Stair training, Dry Needling, Electrical stimulation, Spinal manipulation, Spinal mobilization, Cryotherapy, and Moist heat.   PLAN FOR NEXT SESSION: Discharge   Lovie Macadamia, SPT   Delphia Grates. Fairly IV, PT, DPT Physical Therapist- [place]  [facility]  05/27/23, 12:45 PM

## 2023-05-28 DIAGNOSIS — N2581 Secondary hyperparathyroidism of renal origin: Secondary | ICD-10-CM | POA: Diagnosis not present

## 2023-05-28 DIAGNOSIS — N186 End stage renal disease: Secondary | ICD-10-CM | POA: Diagnosis not present

## 2023-05-28 DIAGNOSIS — D509 Iron deficiency anemia, unspecified: Secondary | ICD-10-CM | POA: Diagnosis not present

## 2023-05-28 DIAGNOSIS — Z992 Dependence on renal dialysis: Secondary | ICD-10-CM | POA: Diagnosis not present

## 2023-05-28 DIAGNOSIS — D631 Anemia in chronic kidney disease: Secondary | ICD-10-CM | POA: Diagnosis not present

## 2023-05-30 ENCOUNTER — Encounter (INDEPENDENT_AMBULATORY_CARE_PROVIDER_SITE_OTHER): Payer: Self-pay | Admitting: Vascular Surgery

## 2023-05-31 DIAGNOSIS — D631 Anemia in chronic kidney disease: Secondary | ICD-10-CM | POA: Diagnosis not present

## 2023-05-31 DIAGNOSIS — Z992 Dependence on renal dialysis: Secondary | ICD-10-CM | POA: Diagnosis not present

## 2023-05-31 DIAGNOSIS — D509 Iron deficiency anemia, unspecified: Secondary | ICD-10-CM | POA: Diagnosis not present

## 2023-05-31 DIAGNOSIS — N2581 Secondary hyperparathyroidism of renal origin: Secondary | ICD-10-CM | POA: Diagnosis not present

## 2023-05-31 DIAGNOSIS — N186 End stage renal disease: Secondary | ICD-10-CM | POA: Diagnosis not present

## 2023-06-02 DIAGNOSIS — D631 Anemia in chronic kidney disease: Secondary | ICD-10-CM | POA: Diagnosis not present

## 2023-06-02 DIAGNOSIS — N2581 Secondary hyperparathyroidism of renal origin: Secondary | ICD-10-CM | POA: Diagnosis not present

## 2023-06-02 DIAGNOSIS — D509 Iron deficiency anemia, unspecified: Secondary | ICD-10-CM | POA: Diagnosis not present

## 2023-06-02 DIAGNOSIS — N186 End stage renal disease: Secondary | ICD-10-CM | POA: Diagnosis not present

## 2023-06-02 DIAGNOSIS — Z992 Dependence on renal dialysis: Secondary | ICD-10-CM | POA: Diagnosis not present

## 2023-06-04 DIAGNOSIS — N2581 Secondary hyperparathyroidism of renal origin: Secondary | ICD-10-CM | POA: Diagnosis not present

## 2023-06-04 DIAGNOSIS — D631 Anemia in chronic kidney disease: Secondary | ICD-10-CM | POA: Diagnosis not present

## 2023-06-04 DIAGNOSIS — D509 Iron deficiency anemia, unspecified: Secondary | ICD-10-CM | POA: Diagnosis not present

## 2023-06-04 DIAGNOSIS — N186 End stage renal disease: Secondary | ICD-10-CM | POA: Diagnosis not present

## 2023-06-04 DIAGNOSIS — Z992 Dependence on renal dialysis: Secondary | ICD-10-CM | POA: Diagnosis not present

## 2023-06-07 DIAGNOSIS — N2581 Secondary hyperparathyroidism of renal origin: Secondary | ICD-10-CM | POA: Diagnosis not present

## 2023-06-07 DIAGNOSIS — N186 End stage renal disease: Secondary | ICD-10-CM | POA: Diagnosis not present

## 2023-06-07 DIAGNOSIS — D509 Iron deficiency anemia, unspecified: Secondary | ICD-10-CM | POA: Diagnosis not present

## 2023-06-07 DIAGNOSIS — D631 Anemia in chronic kidney disease: Secondary | ICD-10-CM | POA: Diagnosis not present

## 2023-06-07 DIAGNOSIS — Z992 Dependence on renal dialysis: Secondary | ICD-10-CM | POA: Diagnosis not present

## 2023-06-08 ENCOUNTER — Ambulatory Visit: Payer: Medicare Other

## 2023-06-08 DIAGNOSIS — M6281 Muscle weakness (generalized): Secondary | ICD-10-CM | POA: Diagnosis not present

## 2023-06-08 DIAGNOSIS — M21371 Foot drop, right foot: Secondary | ICD-10-CM | POA: Diagnosis not present

## 2023-06-08 NOTE — Therapy (Signed)
OUTPATIENT PHYSICAL THERAPY THORACOLUMBAR TREATMENT   Patient Name: Mark Bauer MRN: [id number] DOB:04-09-1956, 67 y.o., male Today's Date: 06/08/2023  END OF SESSION:  PT End of Session - 06/08/23 0809     Visit Number 14    Number of Visits 16    Date for PT Re-Evaluation 06/17/23    PT Start Time 0812    PT Stop Time 0836    PT Time Calculation (min) 24 min    Equipment Utilized During Treatment Gait belt    Activity Tolerance Patient tolerated treatment well    Behavior During Therapy WFL for tasks assessed/performed              Past Medical History:  Diagnosis Date   Allergy    Bilateral undescended testicles    Cancer (HCC)    melanoma on forehead   Cataract    OU   Diabetes mellitus without complication (HCC)    Dysrhythmia    a fib   GERD (gastroesophageal reflux disease)    Gout    Hepatitis    polycystic    Hypertension    Hypokalemia    Mixed hyperlipidemia    Polycystic kidney    Polycystic liver disease    Vitamin D deficiency    Past Surgical History:  Procedure Laterality Date   A/V FISTULAGRAM Left 12/15/2022   Procedure: A/V Fistulagram;  Surgeon: Renford Dills, MD;  Location: [facility];  Service: Cardiovascular;  Laterality: Left;   BASCILIC VEIN TRANSPOSITION Left 02/23/2020   Procedure: BASCILIC VEIN TRANSPOSITION (SINGLE STAGE);  Surgeon: Renford Dills, MD;  Location: [facility];  Service: Vascular;  Laterality: Left;   COLONOSCOPY  2012   hemorrhoids   melanoma removal     forehead   Patient Active Problem List   Diagnosis Date Noted   Right foot drop 01/05/2023   End stage renal disease (HCC) 06/04/2021   Anticoagulated 05/12/2021   Complication of vascular access for dialysis 06/06/2020   Fistula 02/23/2020   Polycystic liver disease 01/10/2020   Polycystic kidney disease 03/01/2019   Metabolic acidosis 09/13/2018   CKD stage 4 due to type 2 diabetes mellitus (HCC) 01/25/2018   Type 2 diabetes mellitus  with diabetic nephropathy, without long-term current use of insulin (HCC) 01/25/2018   Secondary hyperparathyroidism of renal origin (HCC) 08/19/2017   Chronic atrial fibrillation (HCC) 01/06/2017   Essential hypertension 01/06/2017   Hyperuricemia 01/06/2017   Mixed hyperlipidemia 01/06/2017   Gastroesophageal reflux disease 01/06/2017   Obesity 12/26/2012    PCP: Duanne Limerick, MD  REFERRING PROVIDER: Bridgette Habermann, PA-C  REFERRING DIAG: [phone] (ICD-10-CM) - Right foot drop   Rationale for Evaluation and Treatment: Rehabilitation  THERAPY DIAG:  Foot drop, right  Muscle weakness (generalized)  ONSET DATE: June, 2024  SUBJECTIVE:  SUBJECTIVE STATEMENT: Pt denies pain. Agreeable for discharge today from PT. No falls.   PERTINENT HISTORY:  Patient presenting with foot drop which began 1.5 months ago. States this began suddenly. States that he went to bed and woke up with symptoms. Symptoms came on abruptly. No known trigger. No recent injuries. States over the past month and a half symptoms of gotten about "3% better". He is able to very slightly dorsiflex the right foot. States that he has adjusted his walking to compensate for this. Walking without assistance. No recent falls. He feels generally steady. Does report numbness and tingling in bilateral lower extremities. States that this began about 6 to 8 months ago after being diagnosed with peripheral neuropathy. This is limited to the feet. It is constant. Can be worse in the evenings. Pt denies radicular pain or sensory changes. Does have prior history of low back issues including herniated disc.  PAIN:  Are you having pain? Yes: NPRS scale: 0/10 Pain location: No pain, neuropathy related pain at dorsum of bilat feet 5-6/10. Pain description:  N/A Aggravating factors: N/A Relieving factors: Medications (paragaba)  PRECAUTIONS: None  RED FLAGS: Bowel or bladder incontinence: No and Spinal tumors: No   WEIGHT BEARING RESTRICTIONS: No  FALLS:  Has patient fallen in last 6 months? No and "stumbles"  OCCUPATION: Retired, Art gallery manager   PLOF: Independent  PATIENT GOALS: "Improve the foot drop"  NEXT MD VISIT: September, 2024 (Neurologist)   OBJECTIVE:   DIAGNOSTIC FINDINGS:  MRI of the brain w/o contrast, and lumbar spine scheduled for 03/04/2023   PATIENT SURVEYS:  FOTO 73/79  SCREENING FOR RED FLAGS: Bowel or bladder incontinence: No Spinal tumors: No Cauda equina syndrome: No  COGNITION: Overall cognitive status: Within functional limits for tasks assessed     SENSATION: UQNS: No sensation differences between R/L  LQNS: No sensation differences between R/L. Neuropathy of bilat feet noted 2/2 diabetes.   MUSCLE LENGTH: Gastroc-soleus complex tightness bilat. Not formally measured.   POSTURE: No Significant postural limitations  PALPATION: No TTP noted.    CERVICAL AROM: Cervical screen all AROM WNL grossly, no increased pain w/ movement. No indication to further test AROM.   LOWER EXTREMITY ROM:     Active  Right eval Left eval Right  03/02/23 Right  AAROM 10/17  Hip flexion      Hip extension      Hip abduction      Hip adduction      Hip internal rotation      Hip external rotation      Knee flexion      Knee extension      Ankle dorsiflexion 9 30 12 30   Ankle plantarflexion [facility] WFL    Ankle inversion [facility] WFL    Ankle eversion WFL WFL     (Blank rows = not tested)  LOWER EXTREMITY MMT:    MMT Right eval Left eval  Hip flexion 4+ 4  Hip extension    Hip abduction    Hip adduction    Hip internal rotation 4+ 4+  Hip external rotation 4+ 4+  Knee flexion 4+ 4+  Knee extension 4+ 4+  Ankle dorsiflexion 2 4+  Ankle plantarflexion 4+ 4+  Ankle inversion 4+ 4+  Ankle eversion 4+  4+   (Blank rows = not tested)  ANKLE JOINT MOBILITY: Mild hypomobility A/P noted w/ talocrural mobilization R >L, no concordant pain noted.   LUMBAR SPECIAL TESTS:  FABER test: Negative  FUNCTIONAL TESTS:  FGA: 22/30 (please see flowsheets)     SLS: RLE: 5.5 sec, LLE: 10 sec  TODAY'S TREATMENT: DATE: 06/08/23   There- ex:  Nu-Step L4 for 5 min warm up. Not billed.   There-Act: Remainder of session spent reviewing remaining goals for discharge.   Discussed HEP and general balance progressions to improve vestibular input due to LE neuropathy. Discussed safety awareness, reducing impulsivity, and optimal lighting in household during night time with low vision environments all to reduce falls risk. Discussed use of counter top surfaces and chair behind pt to reduce falls risk with HEP compliance as pt lives alone.   PATIENT EDUCATION:  Education details: HEP Person educated: Patient Education method: Explanation, Facilities manager, and Handouts Education comprehension: verbalized understanding  HOME EXERCISE PROGRAM: Access Code: [email or web address] URL: [email or web address] Date: 03/23/2023 Prepared by: Ronnie Derby  Exercises - Seated Ankle Circles  - 1 x daily - 7 x weekly - 2 sets - 15 reps - Standing Gastroc Stretch at Counter  - 1 x daily - 7 x weekly - 1 sets - 3 reps - 30 hold - Long Sitting Calf Stretch with Strap  - 1 x daily - 7 x weekly - 3 sets - 10 reps - Standing March with Counter Support  - 1 x daily - 7 x weekly - 3 sets - 8 reps - Heel Toe Raises with Counter Support  - 3-4 x weekly - 2 sets - 8-10 reps - Mini Squat with Counter Support  - 3-4 x weekly - 3 sets - 12 reps - Standing Balance with Eyes Closed  - 1 x daily - 7 x weekly - 1 sets - 3 reps - 30 hold - Standing Tandem Balance with Counter Support  - 1 x daily - 7 x weekly - 1 sets - 2 reps - 30 hold - Standing Single Leg Stance with Counter Support  - 1 x daily - 7 x weekly - 1 sets - 3 reps -  30 hold  ASSESSMENT:  CLINICAL IMPRESSION:  Pt at end of current POC with pt agreeable to discharge. Pt has met 1/2 remaining goals scoring 27/30 on FGA significantly verifying reduced falls risk compared to beginning of therapy. Pt remains limited on LLE SLS time likely due to his chronic neuropathy that has stayed relatively unchanged since PT eval. Pt understanding of HEP with education provided on optimizing vestibular input and relying on vision due to his neuropathy and maintaining adequate lighting in his home. Pt understanding of education and HEP progressions with no further questions. Pt and PT in agreement to continue to optimize balance gains at home and discharge from PT.  OBJECTIVE IMPAIRMENTS: decreased mobility, decreased ROM, decreased strength, and hypomobility.   ACTIVITY LIMITATIONS: carrying heavy objects  PARTICIPATION LIMITATIONS: N/A  PERSONAL FACTORS: Age, Time since onset of injury/illness/exacerbation, and 3+ comorbidities: CKD, HTN, Type 2 diabetes, lymphedema  are also affecting patient's functional outcome.   REHAB POTENTIAL: Good  CLINICAL DECISION MAKING: Stable/uncomplicated  EVALUATION COMPLEXITY: Low   GOALS: Goals reviewed with patient? Yes  SHORT TERM GOALS: Target date: 03/25/2023  Pt will be IND with HEP to increase R LE functional mobility in ankle DF.  Baseline: HEP given to pt 02/25/23 04/08/23: HEP compliant  Goal status: MET   LONG TERM GOALS: Target date: 06/17/2023  Pt will improve FOTO to target score to demonstrate clinically significant improvement in functional mobility. Baseline: 02/25/2023: 73/79 04/08/23: 78/79 05/06/23: 85/79 Goal status: MET  2.  Pt will improve FGA  score by 4 points to display clinically significant improvement in balance.  Baseline: 02/25/2023: 22/30 04/08/23: 21/30 05/06/23: 24/30; 06/08/23: 27/30 Goal status: MET  3.  Pt will improve R LE DF AROM to 30 deg (equal to L LE DF) to enhance toe clearance  ability and reduce risk of falls.   Baseline: 02/25/2023: 9 deg. 04/08/23: 15 deg.05/06/23: 30 deg (from natural PF position), 5 deg from neutral position  Goal status: MET   4. Pt will improve 30 second STS to 16 to display clinical significance in bilat LE strength, equal to his age matched norms.  Baseline: 04/08/2023: 11.5 05/06/23: 16 Goal status: MET    5. Pt will improve LLE SLS ability to at least 17 seconds to demonstrate significant balance improvements and be equal to the LLE SLS.  Baseline: 05/06/23: SLS Left leg- 17.89 seconds, Right leg- 6.47 seconds 05/20/23: R leg- 14.38; 06/08/23: R/L: 5  sec/15.51 sec Goal status: NOT MET  PLAN:  PT FREQUENCY: 1x/week  PT DURATION: 4 weeks  PLANNED INTERVENTIONS: Therapeutic exercises, Therapeutic activity, Neuromuscular re-education, Balance training, Gait training, Patient/Family education, Self Care, Joint mobilization, Joint manipulation, Stair training, Dry Needling, Electrical stimulation, Spinal manipulation, Spinal mobilization, Cryotherapy, and Moist heat.   PLAN FOR NEXT SESSION: Discharged   Delphia Grates. Fairly IV, PT, DPT Physical Therapist- [place]  [facility]  06/08/23, 8:48 AM

## 2023-06-09 DIAGNOSIS — D509 Iron deficiency anemia, unspecified: Secondary | ICD-10-CM | POA: Diagnosis not present

## 2023-06-09 DIAGNOSIS — Z992 Dependence on renal dialysis: Secondary | ICD-10-CM | POA: Diagnosis not present

## 2023-06-09 DIAGNOSIS — N2581 Secondary hyperparathyroidism of renal origin: Secondary | ICD-10-CM | POA: Diagnosis not present

## 2023-06-09 DIAGNOSIS — N186 End stage renal disease: Secondary | ICD-10-CM | POA: Diagnosis not present

## 2023-06-09 DIAGNOSIS — D631 Anemia in chronic kidney disease: Secondary | ICD-10-CM | POA: Diagnosis not present

## 2023-06-11 DIAGNOSIS — D509 Iron deficiency anemia, unspecified: Secondary | ICD-10-CM | POA: Diagnosis not present

## 2023-06-11 DIAGNOSIS — N186 End stage renal disease: Secondary | ICD-10-CM | POA: Diagnosis not present

## 2023-06-11 DIAGNOSIS — N2581 Secondary hyperparathyroidism of renal origin: Secondary | ICD-10-CM | POA: Diagnosis not present

## 2023-06-11 DIAGNOSIS — D631 Anemia in chronic kidney disease: Secondary | ICD-10-CM | POA: Diagnosis not present

## 2023-06-11 DIAGNOSIS — Z992 Dependence on renal dialysis: Secondary | ICD-10-CM | POA: Diagnosis not present

## 2023-06-13 DIAGNOSIS — D631 Anemia in chronic kidney disease: Secondary | ICD-10-CM | POA: Diagnosis not present

## 2023-06-13 DIAGNOSIS — N2581 Secondary hyperparathyroidism of renal origin: Secondary | ICD-10-CM | POA: Diagnosis not present

## 2023-06-13 DIAGNOSIS — D509 Iron deficiency anemia, unspecified: Secondary | ICD-10-CM | POA: Diagnosis not present

## 2023-06-13 DIAGNOSIS — Z992 Dependence on renal dialysis: Secondary | ICD-10-CM | POA: Diagnosis not present

## 2023-06-13 DIAGNOSIS — N186 End stage renal disease: Secondary | ICD-10-CM | POA: Diagnosis not present

## 2023-06-15 DIAGNOSIS — D509 Iron deficiency anemia, unspecified: Secondary | ICD-10-CM | POA: Diagnosis not present

## 2023-06-15 DIAGNOSIS — Z992 Dependence on renal dialysis: Secondary | ICD-10-CM | POA: Diagnosis not present

## 2023-06-15 DIAGNOSIS — N2581 Secondary hyperparathyroidism of renal origin: Secondary | ICD-10-CM | POA: Diagnosis not present

## 2023-06-15 DIAGNOSIS — D631 Anemia in chronic kidney disease: Secondary | ICD-10-CM | POA: Diagnosis not present

## 2023-06-15 DIAGNOSIS — N186 End stage renal disease: Secondary | ICD-10-CM | POA: Diagnosis not present

## 2023-06-18 DIAGNOSIS — Z992 Dependence on renal dialysis: Secondary | ICD-10-CM | POA: Diagnosis not present

## 2023-06-18 DIAGNOSIS — N186 End stage renal disease: Secondary | ICD-10-CM | POA: Diagnosis not present

## 2023-06-18 DIAGNOSIS — N2581 Secondary hyperparathyroidism of renal origin: Secondary | ICD-10-CM | POA: Diagnosis not present

## 2023-06-18 DIAGNOSIS — D631 Anemia in chronic kidney disease: Secondary | ICD-10-CM | POA: Diagnosis not present

## 2023-06-18 DIAGNOSIS — D509 Iron deficiency anemia, unspecified: Secondary | ICD-10-CM | POA: Diagnosis not present

## 2023-06-19 DIAGNOSIS — N186 End stage renal disease: Secondary | ICD-10-CM | POA: Diagnosis not present

## 2023-06-19 DIAGNOSIS — Z992 Dependence on renal dialysis: Secondary | ICD-10-CM | POA: Diagnosis not present

## 2023-06-19 DIAGNOSIS — Q612 Polycystic kidney, adult type: Secondary | ICD-10-CM | POA: Diagnosis not present

## 2023-06-21 DIAGNOSIS — D631 Anemia in chronic kidney disease: Secondary | ICD-10-CM | POA: Diagnosis not present

## 2023-06-21 DIAGNOSIS — D509 Iron deficiency anemia, unspecified: Secondary | ICD-10-CM | POA: Diagnosis not present

## 2023-06-21 DIAGNOSIS — N186 End stage renal disease: Secondary | ICD-10-CM | POA: Diagnosis not present

## 2023-06-21 DIAGNOSIS — Z992 Dependence on renal dialysis: Secondary | ICD-10-CM | POA: Diagnosis not present

## 2023-06-21 DIAGNOSIS — N2581 Secondary hyperparathyroidism of renal origin: Secondary | ICD-10-CM | POA: Diagnosis not present

## 2023-06-23 DIAGNOSIS — N2581 Secondary hyperparathyroidism of renal origin: Secondary | ICD-10-CM | POA: Diagnosis not present

## 2023-06-23 DIAGNOSIS — Z992 Dependence on renal dialysis: Secondary | ICD-10-CM | POA: Diagnosis not present

## 2023-06-23 DIAGNOSIS — N186 End stage renal disease: Secondary | ICD-10-CM | POA: Diagnosis not present

## 2023-06-23 DIAGNOSIS — D631 Anemia in chronic kidney disease: Secondary | ICD-10-CM | POA: Diagnosis not present

## 2023-06-23 DIAGNOSIS — D509 Iron deficiency anemia, unspecified: Secondary | ICD-10-CM | POA: Diagnosis not present

## 2023-06-25 ENCOUNTER — Other Ambulatory Visit: Payer: Self-pay | Admitting: Family Medicine

## 2023-06-25 DIAGNOSIS — D631 Anemia in chronic kidney disease: Secondary | ICD-10-CM | POA: Diagnosis not present

## 2023-06-25 DIAGNOSIS — Z992 Dependence on renal dialysis: Secondary | ICD-10-CM | POA: Diagnosis not present

## 2023-06-25 DIAGNOSIS — N186 End stage renal disease: Secondary | ICD-10-CM | POA: Diagnosis not present

## 2023-06-25 DIAGNOSIS — D509 Iron deficiency anemia, unspecified: Secondary | ICD-10-CM | POA: Diagnosis not present

## 2023-06-25 DIAGNOSIS — E1121 Type 2 diabetes mellitus with diabetic nephropathy: Secondary | ICD-10-CM

## 2023-06-25 DIAGNOSIS — N2581 Secondary hyperparathyroidism of renal origin: Secondary | ICD-10-CM | POA: Diagnosis not present

## 2023-06-25 NOTE — Telephone Encounter (Signed)
Requested Prescriptions  Pending Prescriptions Disp Refills   Dulaglutide (TRULICITY) 0.75 MG/0.5ML SOAJ [Pharmacy Med Name: TRULICITY 0.75 MG/0.5 ML PEN] 2 mL 0    Sig: INJECT 0.75 MG SUBCUTANEOUSLY ONE TIME PER WEEK     Endocrinology:  Diabetes - GLP-1 Receptor Agonists Passed - 06/25/2023  2:49 AM      Passed - HBA1C is between 0 and 7.9 and within 180 days    Hgb A1c MFr Bld  Date Value Ref Range Status  01/14/2023 5.1 4.8 - 5.6 % Final    Comment:             Prediabetes: 5.7 - 6.4          Diabetes: >6.4          Glycemic control for adults with diabetes: <7.0          Passed - Valid encounter within last 6 months    Recent Outpatient Visits           5 months ago Type 2 diabetes mellitus with diabetic nephropathy, without long-term current use of insulin (HCC)   [place] [facility], MD   5 months ago Right foot drop   [place] [facility], Ocie Bob, MD   9 months ago Weight gain with edema   [facility], MD   1 year ago Type 2 diabetes mellitus with diabetic nephropathy, without long-term current use of insulin (HCC)   [place] [facility] Inches, MD   1 year ago Venous insufficiency   [place] [facility] Inches, MD

## 2023-06-28 DIAGNOSIS — Z992 Dependence on renal dialysis: Secondary | ICD-10-CM | POA: Diagnosis not present

## 2023-06-28 DIAGNOSIS — D631 Anemia in chronic kidney disease: Secondary | ICD-10-CM | POA: Diagnosis not present

## 2023-06-28 DIAGNOSIS — N2581 Secondary hyperparathyroidism of renal origin: Secondary | ICD-10-CM | POA: Diagnosis not present

## 2023-06-28 DIAGNOSIS — N186 End stage renal disease: Secondary | ICD-10-CM | POA: Diagnosis not present

## 2023-06-28 DIAGNOSIS — D509 Iron deficiency anemia, unspecified: Secondary | ICD-10-CM | POA: Diagnosis not present

## 2023-06-30 DIAGNOSIS — N2581 Secondary hyperparathyroidism of renal origin: Secondary | ICD-10-CM | POA: Diagnosis not present

## 2023-06-30 DIAGNOSIS — N186 End stage renal disease: Secondary | ICD-10-CM | POA: Diagnosis not present

## 2023-06-30 DIAGNOSIS — Z992 Dependence on renal dialysis: Secondary | ICD-10-CM | POA: Diagnosis not present

## 2023-06-30 DIAGNOSIS — D509 Iron deficiency anemia, unspecified: Secondary | ICD-10-CM | POA: Diagnosis not present

## 2023-06-30 DIAGNOSIS — D631 Anemia in chronic kidney disease: Secondary | ICD-10-CM | POA: Diagnosis not present

## 2023-07-02 DIAGNOSIS — D631 Anemia in chronic kidney disease: Secondary | ICD-10-CM | POA: Diagnosis not present

## 2023-07-02 DIAGNOSIS — N2581 Secondary hyperparathyroidism of renal origin: Secondary | ICD-10-CM | POA: Diagnosis not present

## 2023-07-02 DIAGNOSIS — Z992 Dependence on renal dialysis: Secondary | ICD-10-CM | POA: Diagnosis not present

## 2023-07-02 DIAGNOSIS — D509 Iron deficiency anemia, unspecified: Secondary | ICD-10-CM | POA: Diagnosis not present

## 2023-07-02 DIAGNOSIS — N186 End stage renal disease: Secondary | ICD-10-CM | POA: Diagnosis not present

## 2023-07-05 DIAGNOSIS — N2581 Secondary hyperparathyroidism of renal origin: Secondary | ICD-10-CM | POA: Diagnosis not present

## 2023-07-05 DIAGNOSIS — D631 Anemia in chronic kidney disease: Secondary | ICD-10-CM | POA: Diagnosis not present

## 2023-07-05 DIAGNOSIS — D509 Iron deficiency anemia, unspecified: Secondary | ICD-10-CM | POA: Diagnosis not present

## 2023-07-05 DIAGNOSIS — N186 End stage renal disease: Secondary | ICD-10-CM | POA: Diagnosis not present

## 2023-07-05 DIAGNOSIS — Z992 Dependence on renal dialysis: Secondary | ICD-10-CM | POA: Diagnosis not present

## 2023-07-06 ENCOUNTER — Encounter (INDEPENDENT_AMBULATORY_CARE_PROVIDER_SITE_OTHER): Payer: Medicare Other

## 2023-07-06 ENCOUNTER — Ambulatory Visit (INDEPENDENT_AMBULATORY_CARE_PROVIDER_SITE_OTHER): Payer: BLUE CROSS/BLUE SHIELD | Admitting: Nurse Practitioner

## 2023-07-07 DIAGNOSIS — D509 Iron deficiency anemia, unspecified: Secondary | ICD-10-CM | POA: Diagnosis not present

## 2023-07-07 DIAGNOSIS — D631 Anemia in chronic kidney disease: Secondary | ICD-10-CM | POA: Diagnosis not present

## 2023-07-07 DIAGNOSIS — N186 End stage renal disease: Secondary | ICD-10-CM | POA: Diagnosis not present

## 2023-07-07 DIAGNOSIS — Z992 Dependence on renal dialysis: Secondary | ICD-10-CM | POA: Diagnosis not present

## 2023-07-07 DIAGNOSIS — N2581 Secondary hyperparathyroidism of renal origin: Secondary | ICD-10-CM | POA: Diagnosis not present

## 2023-07-09 ENCOUNTER — Other Ambulatory Visit: Payer: Self-pay | Admitting: Family Medicine

## 2023-07-09 DIAGNOSIS — N2581 Secondary hyperparathyroidism of renal origin: Secondary | ICD-10-CM | POA: Diagnosis not present

## 2023-07-09 DIAGNOSIS — N186 End stage renal disease: Secondary | ICD-10-CM | POA: Diagnosis not present

## 2023-07-09 DIAGNOSIS — D509 Iron deficiency anemia, unspecified: Secondary | ICD-10-CM | POA: Diagnosis not present

## 2023-07-09 DIAGNOSIS — E1121 Type 2 diabetes mellitus with diabetic nephropathy: Secondary | ICD-10-CM

## 2023-07-09 DIAGNOSIS — D631 Anemia in chronic kidney disease: Secondary | ICD-10-CM | POA: Diagnosis not present

## 2023-07-09 DIAGNOSIS — Z992 Dependence on renal dialysis: Secondary | ICD-10-CM | POA: Diagnosis not present

## 2023-07-09 NOTE — Telephone Encounter (Signed)
Rx was sent to pharmacy on 06/25/23 #2ML/0. Pt is needing an appt for further refills.   Requested Prescriptions  Refused Prescriptions Disp Refills   TRULICITY 0.75 MG/0.5ML SOAJ [Pharmacy Med Name: TRULICITY 0.75 MG/0.5 ML PEN]      Sig: INJECT 0.75 MG SUBCUTANEOUSLY ONE TIME PER WEEK     Endocrinology:  Diabetes - GLP-1 Receptor Agonists Passed - 07/09/2023 11:57 AM      Passed - HBA1C is between 0 and 7.9 and within 180 days    Hgb A1c MFr Bld  Date Value Ref Range Status  01/14/2023 5.1 4.8 - 5.6 % Final    Comment:             Prediabetes: 5.7 - 6.4          Diabetes: >6.4          Glycemic control for adults with diabetes: <7.0          Passed - Valid encounter within last 6 months    Recent Outpatient Visits           5 months ago Type 2 diabetes mellitus with diabetic nephropathy, without long-term current use of insulin (HCC)   [facility] [facility], [facility]   6 months ago Right foot drop   [place] [facility], Ocie Bob, MD   9 months ago Weight gain with edema   [facility], MD   1 year ago Type 2 diabetes mellitus with diabetic nephropathy, without long-term current use of insulin (HCC)   [place] [facility], MD   1 year ago Venous insufficiency   [place] [facility], MD

## 2023-07-11 DIAGNOSIS — N2581 Secondary hyperparathyroidism of renal origin: Secondary | ICD-10-CM | POA: Diagnosis not present

## 2023-07-11 DIAGNOSIS — Z992 Dependence on renal dialysis: Secondary | ICD-10-CM | POA: Diagnosis not present

## 2023-07-11 DIAGNOSIS — D509 Iron deficiency anemia, unspecified: Secondary | ICD-10-CM | POA: Diagnosis not present

## 2023-07-11 DIAGNOSIS — N186 End stage renal disease: Secondary | ICD-10-CM | POA: Diagnosis not present

## 2023-07-11 DIAGNOSIS — D631 Anemia in chronic kidney disease: Secondary | ICD-10-CM | POA: Diagnosis not present

## 2023-07-13 DIAGNOSIS — Z992 Dependence on renal dialysis: Secondary | ICD-10-CM | POA: Diagnosis not present

## 2023-07-13 DIAGNOSIS — N186 End stage renal disease: Secondary | ICD-10-CM | POA: Diagnosis not present

## 2023-07-13 DIAGNOSIS — D509 Iron deficiency anemia, unspecified: Secondary | ICD-10-CM | POA: Diagnosis not present

## 2023-07-13 DIAGNOSIS — D631 Anemia in chronic kidney disease: Secondary | ICD-10-CM | POA: Diagnosis not present

## 2023-07-13 DIAGNOSIS — N2581 Secondary hyperparathyroidism of renal origin: Secondary | ICD-10-CM | POA: Diagnosis not present

## 2023-07-15 ENCOUNTER — Ambulatory Visit (INDEPENDENT_AMBULATORY_CARE_PROVIDER_SITE_OTHER): Payer: Medicare Other | Admitting: Family Medicine

## 2023-07-15 ENCOUNTER — Encounter: Payer: Self-pay | Admitting: Family Medicine

## 2023-07-15 VITALS — BP 119/68 | HR 77 | Ht 72.0 in | Wt 161.2 lb

## 2023-07-15 DIAGNOSIS — K219 Gastro-esophageal reflux disease without esophagitis: Secondary | ICD-10-CM | POA: Diagnosis not present

## 2023-07-15 DIAGNOSIS — I10 Essential (primary) hypertension: Secondary | ICD-10-CM

## 2023-07-15 DIAGNOSIS — Z7985 Long-term (current) use of injectable non-insulin antidiabetic drugs: Secondary | ICD-10-CM

## 2023-07-15 DIAGNOSIS — E790 Hyperuricemia without signs of inflammatory arthritis and tophaceous disease: Secondary | ICD-10-CM | POA: Diagnosis not present

## 2023-07-15 DIAGNOSIS — E782 Mixed hyperlipidemia: Secondary | ICD-10-CM

## 2023-07-15 DIAGNOSIS — E559 Vitamin D deficiency, unspecified: Secondary | ICD-10-CM

## 2023-07-15 DIAGNOSIS — E119 Type 2 diabetes mellitus without complications: Secondary | ICD-10-CM

## 2023-07-15 DIAGNOSIS — R635 Abnormal weight gain: Secondary | ICD-10-CM | POA: Diagnosis not present

## 2023-07-15 DIAGNOSIS — R5383 Other fatigue: Secondary | ICD-10-CM

## 2023-07-15 DIAGNOSIS — R609 Edema, unspecified: Secondary | ICD-10-CM | POA: Diagnosis not present

## 2023-07-15 MED ORDER — PANTOPRAZOLE SODIUM 40 MG PO TBEC: 40.0000 mg | DELAYED_RELEASE_TABLET | Freq: Every day | ORAL | 1 refills | Status: DC

## 2023-07-15 MED ORDER — ALLOPURINOL 100 MG PO TABS: 100.0000 mg | ORAL_TABLET | Freq: Every day | ORAL | 1 refills | Status: DC

## 2023-07-15 MED ORDER — TRULICITY 0.75 MG/0.5ML ~~LOC~~ SOAJ: SUBCUTANEOUS | 1 refills | Status: DC

## 2023-07-15 MED ORDER — LOVASTATIN 20 MG PO TABS: 20.0000 mg | ORAL_TABLET | Freq: Every day | ORAL | 1 refills | Status: DC

## 2023-07-15 MED ORDER — FUROSEMIDE 20 MG PO TABS: 20.0000 mg | ORAL_TABLET | Freq: Two times a day (BID) | ORAL | 1 refills | Status: DC

## 2023-07-15 NOTE — Progress Notes (Signed)
Date:  07/15/2023   Name:  Mark Bauer   DOB:  08-26-1955   MRN:  [id number]   Chief Complaint: Diabetes and Gastroesophageal Reflux  Diabetes He presents for his follow-up diabetic visit. He has type 2 diabetes mellitus. There are no hypoglycemic associated symptoms. Pertinent negatives for hypoglycemia include no confusion, dizziness, headaches, hunger, mood changes, nervousness/anxiousness, pallor, seizures, sleepiness, speech difficulty, sweats or tremors. Associated symptoms include fatigue. Pertinent negatives for diabetes include no blurred vision, no chest pain, no foot paresthesias, no polydipsia, no polyphagia, no polyuria, no visual change, no weakness and no weight loss. There are no hypoglycemic complications. Pertinent negatives for hypoglycemia complications include no blackouts. Symptoms are stable. Pertinent negatives for diabetic complications include no autonomic neuropathy, CVA, heart disease, impotence, nephropathy, peripheral neuropathy, PVD or retinopathy. Risk factors for coronary artery disease include dyslipidemia, hypertension and male sex. Current diabetic treatments: trulicity. His weight is stable. He is following a generally healthy diet. Meal planning includes avoidance of concentrated sweets and carbohydrate counting. He rarely participates in exercise. There is no change in his home blood glucose trend. His breakfast blood glucose range is generally 90-110 mg/dl. An ACE inhibitor/angiotensin II receptor blocker is being taken.  Gastroesophageal Reflux He reports no belching, no chest pain, no coughing, no dysphagia, no globus sensation, no heartburn, no hoarse voice, no stridor or no wheezing. This is a chronic problem. The current episode started more than 1 year ago. The problem has been gradually improving. The symptoms are aggravated by certain foods. Associated symptoms include fatigue. Pertinent negatives include no anemia, melena, muscle weakness, orthopnea  or weight loss. There are no known risk factors. He has tried a PPI for the symptoms. The treatment provided moderate relief.  Hypertension This is a chronic problem. The current episode started more than 1 year ago. The problem has been gradually improving since onset. The problem is controlled. Pertinent negatives include no anxiety, blurred vision, chest pain, headaches, malaise/fatigue, neck pain, orthopnea, palpitations, peripheral edema, PND, shortness of breath or sweats. There are no associated agents to hypertension. Past treatments include diuretics (furosemide/spironolactone). The current treatment provides moderate improvement. There are no compliance problems.  There is no history of CAD/MI, CVA, PVD or retinopathy. There is no history of chronic renal disease, a hypertension causing med or renovascular disease.  Hyperlipidemia This is a chronic problem. The current episode started more than 1 year ago. Recent lipid tests were reviewed and are normal. He has no history of chronic renal disease. Pertinent negatives include no chest pain or shortness of breath. Current antihyperlipidemic treatment includes statins. The current treatment provides moderate improvement of lipids. There are no compliance problems.  There are no known risk factors for coronary artery disease.    Lab Results  Component Value Date   NA 144 09/14/2022   K 5.0 09/14/2022   CO2 19 (L) 09/14/2022   GLUCOSE 91 09/14/2022   BUN 101 (HH) 09/14/2022   CREATININE 9.53 (H) 09/14/2022   CALCIUM 10.4 (H) 09/14/2022   EGFR 5 (L) 09/14/2022   GFRNONAA 8 (L) 02/16/2020   Lab Results  Component Value Date   CHOL 83 (L) 09/14/2022   HDL 32 (L) 09/14/2022   LDLCALC 37 09/14/2022   TRIG 62 09/14/2022   CHOLHDL 3.7 12/16/2016   Lab Results  Component Value Date   TSH 3.040 01/09/2019   Lab Results  Component Value Date   HGBA1C 5.1 01/14/2023   Lab Results  Component Value Date  WBC 5.9 09/12/2021   HGB 10.0  (L) 09/12/2021   HCT 30.8 (L) 09/12/2021   MCV 103 (H) 09/12/2021   PLT 119 (L) 09/12/2021   Lab Results  Component Value Date   ALT 14 09/14/2022   AST 22 09/14/2022   ALKPHOS 60 09/14/2022   BILITOT 0.8 09/14/2022   No results found for: "25OHVITD2", "25OHVITD3", "VD25OH"   Review of Systems  Constitutional:  Positive for fatigue. Negative for malaise/fatigue, unexpected weight change and weight loss.  HENT:  Negative for congestion, drooling, hoarse voice, nosebleeds, postnasal drip, rhinorrhea, sinus pressure, sinus pain and sneezing.   Eyes:  Negative for blurred vision and visual disturbance.  Respiratory:  Negative for cough, chest tightness, shortness of breath and wheezing.   Cardiovascular:  Negative for chest pain, palpitations, orthopnea, leg swelling and PND.  Gastrointestinal:  Negative for dysphagia, heartburn and melena.  Endocrine: Negative for polydipsia, polyphagia and polyuria.  Genitourinary:  Negative for impotence.  Musculoskeletal:  Negative for muscle weakness and neck pain.  Skin:  Negative for pallor.  Neurological:  Negative for dizziness, tremors, seizures, speech difficulty, weakness and headaches.  Psychiatric/Behavioral:  Negative for confusion. The patient is not nervous/anxious.     Patient Active Problem List   Diagnosis Date Noted   Right foot drop 01/05/2023   End stage renal disease (HCC) 06/04/2021   Anticoagulated 05/12/2021   Complication of vascular access for dialysis 06/06/2020   Fistula 02/23/2020   Polycystic liver disease 01/10/2020   Polycystic kidney disease 03/01/2019   Metabolic acidosis 09/13/2018   CKD stage 4 due to type 2 diabetes mellitus (HCC) 01/25/2018   Type 2 diabetes mellitus with diabetic nephropathy, without long-term current use of insulin (HCC) 01/25/2018   Secondary hyperparathyroidism of renal origin (HCC) 08/19/2017   Chronic atrial fibrillation (HCC) 01/06/2017   Essential hypertension 01/06/2017    Hyperuricemia 01/06/2017   Mixed hyperlipidemia 01/06/2017   Gastroesophageal reflux disease 01/06/2017   Obesity 12/26/2012    Allergies  Allergen Reactions   Ciprofloxacin Other (See Comments)    hallucination  Other Reaction(s): Unknown  Other Reaction(s): Hallucination  hallucination  Other Reaction(s): Unknown   Pollen Extract Other (See Comments)    Sneezing, watery eyes, etc.  Other Reaction(s): Unknown   Caffeine Palpitations    palpatations    Past Surgical History:  Procedure Laterality Date   A/V FISTULAGRAM Left 12/15/2022   Procedure: A/V Fistulagram;  Surgeon: Renford Dills, MD;  Location: [facility];  Service: Cardiovascular;  Laterality: Left;   BASCILIC VEIN TRANSPOSITION Left 02/23/2020   Procedure: BASCILIC VEIN TRANSPOSITION (SINGLE STAGE);  Surgeon: Renford Dills, MD;  Location: [facility] ORS;  Service: Vascular;  Laterality: Left;   COLONOSCOPY  2012   hemorrhoids   melanoma removal     forehead    Social History   Tobacco Use   Smoking status: Never   Smokeless tobacco: Never  Vaping Use   Vaping status: Never Used  Substance Use Topics   Alcohol use: No   Drug use: No     Medication list has been reviewed and updated.  Current Meds  Medication Sig   allopurinol (ZYLOPRIM) 100 MG tablet TAKE 1 TABLET BY MOUTH EVERY DAY   amoxicillin-clavulanate (AUGMENTIN) 500-125 MG tablet Take 500 mg by mouth daily.   blood glucose meter kit and supplies Dispense based on patient and insurance preference. Use up to four times daily as directed. (FOR ICD-10 E11.9).   cyanocobalamin 1000 MCG tablet Take  1,000 mcg by mouth daily.   Dulaglutide (TRULICITY) 0.75 MG/0.5ML SOAJ INJECT 0.75 MG SUBCUTANEOUSLY ONE TIME PER WEEK   ELIQUIS 2.5 MG TABS tablet TAKE 1 TABLET BY MOUTH TWICE A DAY   furosemide (LASIX) 20 MG tablet Take 1 tablet (20 mg total) by mouth daily.   Lancets (ONETOUCH DELICA PLUS LANCET30G) MISC USE AS DIRECTED EVERY DAY    loratadine (CLARITIN) 10 MG tablet Take 10 mg by mouth at bedtime. otc   lovastatin (MEVACOR) 20 MG tablet Take 1 tablet (20 mg total) by mouth daily.   ONETOUCH ULTRA test strip ONE TEST DAILY   pantoprazole (PROTONIX) 40 MG tablet Take 1 tablet (40 mg total) by mouth daily.   sevelamer carbonate (RENVELA) 800 MG tablet Take 800 mg by mouth 3 (three) times daily with meals.   spironolactone (ALDACTONE) 25 MG tablet Take 25 mg by mouth daily.       07/15/2023    9:15 AM 01/14/2023   10:42 AM 09/14/2022    9:48 AM 05/12/2022   10:06 AM  GAD 7 : Generalized Anxiety Score  Nervous, Anxious, on Edge 0 0 0 0  Control/stop worrying 0 0 0 1  Worry too much - different things 0 0 0 1  Trouble relaxing 0 0 0 0  Restless 0 0 0 0  Easily annoyed or irritable 0 0 0 0  Afraid - awful might happen 0 0 0 0  Total GAD 7 Score 0 0 0 2  Anxiety Difficulty Not difficult at all Not difficult at all Not difficult at all Not difficult at all       07/15/2023    9:15 AM 01/28/2023   10:29 AM 01/14/2023   10:42 AM  Depression screen PHQ 2/9  Decreased Interest 1 0 0  Down, Depressed, Hopeless 1 1 1   PHQ - 2 Score 2 1 1   Altered sleeping 0 0 0  Tired, decreased energy 2 0 0  Change in appetite 0 0 0  Feeling bad or failure about yourself  0 0 0  Trouble concentrating 0 0 0  Moving slowly or fidgety/restless 0 0 0  Suicidal thoughts 0 0 0  PHQ-9 Score 4 1 1   Difficult doing work/chores Somewhat difficult Not difficult at all Not difficult at all    BP Readings from Last 3 Encounters:  07/15/23 119/68  05/27/23 93/63  01/14/23 99/60    Physical Exam Vitals and nursing note reviewed.  HENT:     Right Ear: Tympanic membrane, ear canal and external ear normal.     Left Ear: Tympanic membrane, ear canal and external ear normal.     Nose: Nose normal. No congestion or rhinorrhea.     Mouth/Throat:     Mouth: Mucous membranes are moist.     Pharynx: Oropharynx is clear. No oropharyngeal  exudate or posterior oropharyngeal erythema.  Eyes:     General: No scleral icterus.       Right eye: No discharge.        Left eye: No discharge.     Extraocular Movements: Extraocular movements intact.     Conjunctiva/sclera: Conjunctivae normal.     Pupils: Pupils are equal, round, and reactive to light.  Cardiovascular:     Rate and Rhythm: Bradycardia present.     Heart sounds: No murmur heard.    No friction rub. No gallop.  Pulmonary:     Effort: Pulmonary effort is normal.     Breath sounds: Normal breath  sounds. No wheezing, rhonchi or rales.  Abdominal:     General: Abdomen is flat.     Tenderness: There is no abdominal tenderness. There is no right CVA tenderness, left CVA tenderness or guarding.     Hernia: No hernia is present.  Musculoskeletal:     Cervical back: Normal range of motion and neck supple.  Neurological:     Motor: No weakness.     Coordination: Coordination normal.     Gait: Gait normal.     Wt Readings from Last 3 Encounters:  07/15/23 161 lb 3.2 oz (73.1 kg)  05/27/23 158 lb 3.2 oz (71.8 kg)  01/28/23 162 lb (73.5 kg)    BP 119/68   Pulse 77   Ht 6' (1.829 m)   Wt 161 lb 3.2 oz (73.1 kg)   SpO2 99%   BMI 21.86 kg/m   Assessment and Plan: 1. Diabetes mellitus treated with injections of non-insulin medication (HCC) (Primary) Chronic.  Controlled.  Stable.  Asymptomatic.  Tolerating medications well.  Without polyuria polydipsia.  Continue Trulicity 0.75 subcu once a week.  Will check CMP and A1c for current level of control.  Will recheck in 4 months. - Comprehensive metabolic panel - Hemoglobin A1c - Dulaglutide (TRULICITY) 0.75 MG/0.5ML SOAJ; INJECT 0.75 MG SUBCUTANEOUSLY ONE TIME PER WEEK  Dispense: 2 mL; Refill: 1  2. Hyperuricemia Chronic.  Controlled.  Stable.  Pending uric acid pending whether or not we can maintain 100 or if we go 12 more frequent dosing may be 200. - Uric acid - allopurinol (ZYLOPRIM) 100 MG tablet; Take 1  tablet (100 mg total) by mouth daily.  Dispense: 90 tablet; Refill: 1  3. Essential hypertension Chronic.  Controlled.  Stable.  Asymptomatic.  Tolerating medications well.  Blood pressure 119/68.  Will continue at current dosing of furosemide and spironolactone per nephrology.  Will check CMP for electrolytes and GFR. - Comprehensive metabolic panel  4. Mixed hyperlipidemia Chronic.  Controlled.  Stable.  Lipid panel will be done to determine if LDL control is sufficient on current dosing of lovastatin 20 mg once a day. - Lipid Panel With LDL/HDL Ratio - lovastatin (MEVACOR) 20 MG tablet; Take 1 tablet (20 mg total) by mouth daily.  Dispense: 90 tablet; Refill: 1  5. Vitamin D deficiency Chronic.  Controlled.  Stable.  Receives vitamin D at dialysis but we will recheck to see if he is getting sufficient amounts of vitamin D and if this may need to be increased with supplemental outside of days of dialysis. - VITAMIN D 25 Hydroxy (Vit-D Deficiency, Fractures)  6. Fatigue, unspecified type Chronic.  Controlled.  Stable.  Continues to have fatigue this is most likely due to the dialysis and fluid removal.  But there is a possibility of other etiologies and we will check TSH B12 folic acid and vitamin D the. - TSH - B12 and Folate Panel - VITAMIN D 25 Hydroxy (Vit-D Deficiency, Fractures)  7. Weight gain with edema As noted has edema we will treat with furosemide 20 mg twice a day. - furosemide (LASIX) 20 MG tablet; Take 1 tablet (20 mg total) by mouth 2 (two) times daily.  Dispense: 180 tablet; Refill: 1  8. Gastroesophageal reflux disease, unspecified whether esophagitis present Chronic.  Controlled.  Stable.  Without dysphagia.  Continue pantoprazole 40 mg once a day. - pantoprazole (PROTONIX) 40 MG tablet; Take 1 tablet (40 mg total) by mouth daily.  Dispense: 90 tablet; Refill: 1  Elizabeth Sauer, MD

## 2023-07-16 ENCOUNTER — Encounter: Payer: Self-pay | Admitting: Family Medicine

## 2023-07-16 DIAGNOSIS — D509 Iron deficiency anemia, unspecified: Secondary | ICD-10-CM | POA: Diagnosis not present

## 2023-07-16 DIAGNOSIS — N2581 Secondary hyperparathyroidism of renal origin: Secondary | ICD-10-CM | POA: Diagnosis not present

## 2023-07-16 DIAGNOSIS — Z992 Dependence on renal dialysis: Secondary | ICD-10-CM | POA: Diagnosis not present

## 2023-07-16 DIAGNOSIS — N186 End stage renal disease: Secondary | ICD-10-CM | POA: Diagnosis not present

## 2023-07-16 DIAGNOSIS — D631 Anemia in chronic kidney disease: Secondary | ICD-10-CM | POA: Diagnosis not present

## 2023-07-16 LAB — LIPID PANEL WITH LDL/HDL RATIO
Cholesterol, Total: 139 mg/dL (ref 100–199)
HDL: 69 mg/dL (ref >39)
LDL Chol Calc (NIH): 61 mg/dL (ref 0–99)
LDL/HDL Ratio: 0.9 {ratio} (ref 0.0–3.6)
Triglycerides: 32 mg/dL (ref 0–149)
VLDL Cholesterol Cal: 9 mg/dL (ref 5–40)

## 2023-07-16 LAB — VITAMIN D 25 HYDROXY (VIT D DEFICIENCY, FRACTURES): Vit D, 25-Hydroxy: 51.7 ng/mL (ref 30.0–100.0)

## 2023-07-16 LAB — COMPREHENSIVE METABOLIC PANEL
ALT: 10 [IU]/L (ref 0–44)
AST: 17 [IU]/L (ref 0–40)
Albumin: 4.1 g/dL (ref 3.9–4.9)
Alkaline Phosphatase: 89 [IU]/L (ref 44–121)
BUN/Creatinine Ratio: 8 — ABNORMAL LOW (ref 10–24)
BUN: 63 mg/dL — ABNORMAL HIGH (ref 8–27)
Bilirubin Total: 0.5 mg/dL (ref 0.0–1.2)
CO2: 31 mmol/L — ABNORMAL HIGH (ref 20–29)
Calcium: 9.8 mg/dL (ref 8.6–10.2)
Chloride: 93 mmol/L — ABNORMAL LOW (ref 96–106)
Creatinine, Ser: 7.89 mg/dL — ABNORMAL HIGH (ref 0.76–1.27)
Globulin, Total: 1.9 g/dL (ref 1.5–4.5)
Glucose: 101 mg/dL — ABNORMAL HIGH (ref 70–99)
Potassium: 4.1 mmol/L (ref 3.5–5.2)
Sodium: 141 mmol/L (ref 134–144)
Total Protein: 6 g/dL (ref 6.0–8.5)
eGFR: 7 mL/min/{1.73_m2} — ABNORMAL LOW (ref >59)

## 2023-07-16 LAB — B12 AND FOLATE PANEL
Folate: >20 ng/mL (ref >3.0)
Vitamin B-12: 1337 pg/mL — ABNORMAL HIGH (ref 232–1245)

## 2023-07-16 LAB — URIC ACID: Uric Acid: 4.9 mg/dL (ref 3.8–8.4)

## 2023-07-16 LAB — TSH: TSH: 1.43 u[IU]/mL (ref 0.450–4.500)

## 2023-07-16 LAB — HEMOGLOBIN A1C
Est. average glucose Bld gHb Est-mCnc: 105 mg/dL
Hgb A1c MFr Bld: 5.3 % (ref 4.8–5.6)

## 2023-07-18 DIAGNOSIS — D509 Iron deficiency anemia, unspecified: Secondary | ICD-10-CM | POA: Diagnosis not present

## 2023-07-18 DIAGNOSIS — D631 Anemia in chronic kidney disease: Secondary | ICD-10-CM | POA: Diagnosis not present

## 2023-07-18 DIAGNOSIS — N2581 Secondary hyperparathyroidism of renal origin: Secondary | ICD-10-CM | POA: Diagnosis not present

## 2023-07-18 DIAGNOSIS — N186 End stage renal disease: Secondary | ICD-10-CM | POA: Diagnosis not present

## 2023-07-18 DIAGNOSIS — Z992 Dependence on renal dialysis: Secondary | ICD-10-CM | POA: Diagnosis not present

## 2023-07-20 DIAGNOSIS — D631 Anemia in chronic kidney disease: Secondary | ICD-10-CM | POA: Diagnosis not present

## 2023-07-20 DIAGNOSIS — N2581 Secondary hyperparathyroidism of renal origin: Secondary | ICD-10-CM | POA: Diagnosis not present

## 2023-07-20 DIAGNOSIS — D509 Iron deficiency anemia, unspecified: Secondary | ICD-10-CM | POA: Diagnosis not present

## 2023-07-20 DIAGNOSIS — N186 End stage renal disease: Secondary | ICD-10-CM | POA: Diagnosis not present

## 2023-07-20 DIAGNOSIS — Z992 Dependence on renal dialysis: Secondary | ICD-10-CM | POA: Diagnosis not present

## 2023-07-20 DIAGNOSIS — Q612 Polycystic kidney, adult type: Secondary | ICD-10-CM | POA: Diagnosis not present

## 2023-07-23 DIAGNOSIS — N2581 Secondary hyperparathyroidism of renal origin: Secondary | ICD-10-CM | POA: Diagnosis not present

## 2023-07-23 DIAGNOSIS — N186 End stage renal disease: Secondary | ICD-10-CM | POA: Diagnosis not present

## 2023-07-23 DIAGNOSIS — D509 Iron deficiency anemia, unspecified: Secondary | ICD-10-CM | POA: Diagnosis not present

## 2023-07-23 DIAGNOSIS — D631 Anemia in chronic kidney disease: Secondary | ICD-10-CM | POA: Diagnosis not present

## 2023-07-23 DIAGNOSIS — Z992 Dependence on renal dialysis: Secondary | ICD-10-CM | POA: Diagnosis not present

## 2023-07-26 DIAGNOSIS — D509 Iron deficiency anemia, unspecified: Secondary | ICD-10-CM | POA: Diagnosis not present

## 2023-07-26 DIAGNOSIS — N2581 Secondary hyperparathyroidism of renal origin: Secondary | ICD-10-CM | POA: Diagnosis not present

## 2023-07-26 DIAGNOSIS — N186 End stage renal disease: Secondary | ICD-10-CM | POA: Diagnosis not present

## 2023-07-26 DIAGNOSIS — D631 Anemia in chronic kidney disease: Secondary | ICD-10-CM | POA: Diagnosis not present

## 2023-07-26 DIAGNOSIS — Z992 Dependence on renal dialysis: Secondary | ICD-10-CM | POA: Diagnosis not present

## 2023-07-27 DIAGNOSIS — R2689 Other abnormalities of gait and mobility: Secondary | ICD-10-CM | POA: Diagnosis not present

## 2023-07-27 DIAGNOSIS — G629 Polyneuropathy, unspecified: Secondary | ICD-10-CM | POA: Diagnosis not present

## 2023-07-27 DIAGNOSIS — M21371 Foot drop, right foot: Secondary | ICD-10-CM | POA: Diagnosis not present

## 2023-07-27 DIAGNOSIS — R200 Anesthesia of skin: Secondary | ICD-10-CM | POA: Diagnosis not present

## 2023-07-27 DIAGNOSIS — R202 Paresthesia of skin: Secondary | ICD-10-CM | POA: Diagnosis not present

## 2023-07-28 DIAGNOSIS — N186 End stage renal disease: Secondary | ICD-10-CM | POA: Diagnosis not present

## 2023-07-28 DIAGNOSIS — D509 Iron deficiency anemia, unspecified: Secondary | ICD-10-CM | POA: Diagnosis not present

## 2023-07-28 DIAGNOSIS — Z992 Dependence on renal dialysis: Secondary | ICD-10-CM | POA: Diagnosis not present

## 2023-07-28 DIAGNOSIS — D631 Anemia in chronic kidney disease: Secondary | ICD-10-CM | POA: Diagnosis not present

## 2023-07-28 DIAGNOSIS — N2581 Secondary hyperparathyroidism of renal origin: Secondary | ICD-10-CM | POA: Diagnosis not present

## 2023-07-30 DIAGNOSIS — Z992 Dependence on renal dialysis: Secondary | ICD-10-CM | POA: Diagnosis not present

## 2023-07-30 DIAGNOSIS — D509 Iron deficiency anemia, unspecified: Secondary | ICD-10-CM | POA: Diagnosis not present

## 2023-07-30 DIAGNOSIS — N186 End stage renal disease: Secondary | ICD-10-CM | POA: Diagnosis not present

## 2023-07-30 DIAGNOSIS — D631 Anemia in chronic kidney disease: Secondary | ICD-10-CM | POA: Diagnosis not present

## 2023-07-30 DIAGNOSIS — N2581 Secondary hyperparathyroidism of renal origin: Secondary | ICD-10-CM | POA: Diagnosis not present

## 2023-08-02 DIAGNOSIS — D631 Anemia in chronic kidney disease: Secondary | ICD-10-CM | POA: Diagnosis not present

## 2023-08-02 DIAGNOSIS — D509 Iron deficiency anemia, unspecified: Secondary | ICD-10-CM | POA: Diagnosis not present

## 2023-08-02 DIAGNOSIS — N186 End stage renal disease: Secondary | ICD-10-CM | POA: Diagnosis not present

## 2023-08-02 DIAGNOSIS — N2581 Secondary hyperparathyroidism of renal origin: Secondary | ICD-10-CM | POA: Diagnosis not present

## 2023-08-02 DIAGNOSIS — Z992 Dependence on renal dialysis: Secondary | ICD-10-CM | POA: Diagnosis not present

## 2023-08-03 ENCOUNTER — Ambulatory Visit (INDEPENDENT_AMBULATORY_CARE_PROVIDER_SITE_OTHER): Payer: Medicare Other | Admitting: Vascular Surgery

## 2023-08-04 DIAGNOSIS — D509 Iron deficiency anemia, unspecified: Secondary | ICD-10-CM | POA: Diagnosis not present

## 2023-08-04 DIAGNOSIS — N2581 Secondary hyperparathyroidism of renal origin: Secondary | ICD-10-CM | POA: Diagnosis not present

## 2023-08-04 DIAGNOSIS — D631 Anemia in chronic kidney disease: Secondary | ICD-10-CM | POA: Diagnosis not present

## 2023-08-04 DIAGNOSIS — Z992 Dependence on renal dialysis: Secondary | ICD-10-CM | POA: Diagnosis not present

## 2023-08-04 DIAGNOSIS — N186 End stage renal disease: Secondary | ICD-10-CM | POA: Diagnosis not present

## 2023-08-06 DIAGNOSIS — N2581 Secondary hyperparathyroidism of renal origin: Secondary | ICD-10-CM | POA: Diagnosis not present

## 2023-08-06 DIAGNOSIS — N186 End stage renal disease: Secondary | ICD-10-CM | POA: Diagnosis not present

## 2023-08-06 DIAGNOSIS — D631 Anemia in chronic kidney disease: Secondary | ICD-10-CM | POA: Diagnosis not present

## 2023-08-06 DIAGNOSIS — Z992 Dependence on renal dialysis: Secondary | ICD-10-CM | POA: Diagnosis not present

## 2023-08-06 DIAGNOSIS — D509 Iron deficiency anemia, unspecified: Secondary | ICD-10-CM | POA: Diagnosis not present

## 2023-08-09 DIAGNOSIS — D631 Anemia in chronic kidney disease: Secondary | ICD-10-CM | POA: Diagnosis not present

## 2023-08-09 DIAGNOSIS — N186 End stage renal disease: Secondary | ICD-10-CM | POA: Diagnosis not present

## 2023-08-09 DIAGNOSIS — Z992 Dependence on renal dialysis: Secondary | ICD-10-CM | POA: Diagnosis not present

## 2023-08-09 DIAGNOSIS — N2581 Secondary hyperparathyroidism of renal origin: Secondary | ICD-10-CM | POA: Diagnosis not present

## 2023-08-09 DIAGNOSIS — D509 Iron deficiency anemia, unspecified: Secondary | ICD-10-CM | POA: Diagnosis not present

## 2023-08-11 DIAGNOSIS — D509 Iron deficiency anemia, unspecified: Secondary | ICD-10-CM | POA: Diagnosis not present

## 2023-08-11 DIAGNOSIS — Z992 Dependence on renal dialysis: Secondary | ICD-10-CM | POA: Diagnosis not present

## 2023-08-11 DIAGNOSIS — N2581 Secondary hyperparathyroidism of renal origin: Secondary | ICD-10-CM | POA: Diagnosis not present

## 2023-08-11 DIAGNOSIS — N186 End stage renal disease: Secondary | ICD-10-CM | POA: Diagnosis not present

## 2023-08-11 DIAGNOSIS — D631 Anemia in chronic kidney disease: Secondary | ICD-10-CM | POA: Diagnosis not present

## 2023-08-13 DIAGNOSIS — N186 End stage renal disease: Secondary | ICD-10-CM | POA: Diagnosis not present

## 2023-08-13 DIAGNOSIS — D509 Iron deficiency anemia, unspecified: Secondary | ICD-10-CM | POA: Diagnosis not present

## 2023-08-13 DIAGNOSIS — D631 Anemia in chronic kidney disease: Secondary | ICD-10-CM | POA: Diagnosis not present

## 2023-08-13 DIAGNOSIS — N2581 Secondary hyperparathyroidism of renal origin: Secondary | ICD-10-CM | POA: Diagnosis not present

## 2023-08-13 DIAGNOSIS — Z992 Dependence on renal dialysis: Secondary | ICD-10-CM | POA: Diagnosis not present

## 2023-08-16 DIAGNOSIS — N2581 Secondary hyperparathyroidism of renal origin: Secondary | ICD-10-CM | POA: Diagnosis not present

## 2023-08-16 DIAGNOSIS — D631 Anemia in chronic kidney disease: Secondary | ICD-10-CM | POA: Diagnosis not present

## 2023-08-16 DIAGNOSIS — Z992 Dependence on renal dialysis: Secondary | ICD-10-CM | POA: Diagnosis not present

## 2023-08-16 DIAGNOSIS — D509 Iron deficiency anemia, unspecified: Secondary | ICD-10-CM | POA: Diagnosis not present

## 2023-08-16 DIAGNOSIS — N186 End stage renal disease: Secondary | ICD-10-CM | POA: Diagnosis not present

## 2023-08-18 DIAGNOSIS — Z992 Dependence on renal dialysis: Secondary | ICD-10-CM | POA: Diagnosis not present

## 2023-08-18 DIAGNOSIS — N2581 Secondary hyperparathyroidism of renal origin: Secondary | ICD-10-CM | POA: Diagnosis not present

## 2023-08-18 DIAGNOSIS — N186 End stage renal disease: Secondary | ICD-10-CM | POA: Diagnosis not present

## 2023-08-18 DIAGNOSIS — D509 Iron deficiency anemia, unspecified: Secondary | ICD-10-CM | POA: Diagnosis not present

## 2023-08-18 DIAGNOSIS — D631 Anemia in chronic kidney disease: Secondary | ICD-10-CM | POA: Diagnosis not present

## 2023-08-20 DIAGNOSIS — N186 End stage renal disease: Secondary | ICD-10-CM | POA: Diagnosis not present

## 2023-08-20 DIAGNOSIS — Z992 Dependence on renal dialysis: Secondary | ICD-10-CM | POA: Diagnosis not present

## 2023-08-20 DIAGNOSIS — N2581 Secondary hyperparathyroidism of renal origin: Secondary | ICD-10-CM | POA: Diagnosis not present

## 2023-08-20 DIAGNOSIS — D631 Anemia in chronic kidney disease: Secondary | ICD-10-CM | POA: Diagnosis not present

## 2023-08-20 DIAGNOSIS — D509 Iron deficiency anemia, unspecified: Secondary | ICD-10-CM | POA: Diagnosis not present

## 2023-08-20 DIAGNOSIS — Q612 Polycystic kidney, adult type: Secondary | ICD-10-CM | POA: Diagnosis not present

## 2023-08-23 DIAGNOSIS — Z992 Dependence on renal dialysis: Secondary | ICD-10-CM | POA: Diagnosis not present

## 2023-08-23 DIAGNOSIS — N186 End stage renal disease: Secondary | ICD-10-CM | POA: Diagnosis not present

## 2023-08-23 DIAGNOSIS — D631 Anemia in chronic kidney disease: Secondary | ICD-10-CM | POA: Diagnosis not present

## 2023-08-23 DIAGNOSIS — D509 Iron deficiency anemia, unspecified: Secondary | ICD-10-CM | POA: Diagnosis not present

## 2023-08-23 DIAGNOSIS — N2581 Secondary hyperparathyroidism of renal origin: Secondary | ICD-10-CM | POA: Diagnosis not present

## 2023-08-25 DIAGNOSIS — D631 Anemia in chronic kidney disease: Secondary | ICD-10-CM | POA: Diagnosis not present

## 2023-08-25 DIAGNOSIS — Z992 Dependence on renal dialysis: Secondary | ICD-10-CM | POA: Diagnosis not present

## 2023-08-25 DIAGNOSIS — N2581 Secondary hyperparathyroidism of renal origin: Secondary | ICD-10-CM | POA: Diagnosis not present

## 2023-08-25 DIAGNOSIS — N186 End stage renal disease: Secondary | ICD-10-CM | POA: Diagnosis not present

## 2023-08-25 DIAGNOSIS — D509 Iron deficiency anemia, unspecified: Secondary | ICD-10-CM | POA: Diagnosis not present

## 2023-08-27 DIAGNOSIS — D631 Anemia in chronic kidney disease: Secondary | ICD-10-CM | POA: Diagnosis not present

## 2023-08-27 DIAGNOSIS — Z992 Dependence on renal dialysis: Secondary | ICD-10-CM | POA: Diagnosis not present

## 2023-08-27 DIAGNOSIS — N2581 Secondary hyperparathyroidism of renal origin: Secondary | ICD-10-CM | POA: Diagnosis not present

## 2023-08-27 DIAGNOSIS — D509 Iron deficiency anemia, unspecified: Secondary | ICD-10-CM | POA: Diagnosis not present

## 2023-08-27 DIAGNOSIS — N186 End stage renal disease: Secondary | ICD-10-CM | POA: Diagnosis not present

## 2023-08-30 DIAGNOSIS — Z992 Dependence on renal dialysis: Secondary | ICD-10-CM | POA: Diagnosis not present

## 2023-08-30 DIAGNOSIS — N2581 Secondary hyperparathyroidism of renal origin: Secondary | ICD-10-CM | POA: Diagnosis not present

## 2023-08-30 DIAGNOSIS — N186 End stage renal disease: Secondary | ICD-10-CM | POA: Diagnosis not present

## 2023-08-30 DIAGNOSIS — D631 Anemia in chronic kidney disease: Secondary | ICD-10-CM | POA: Diagnosis not present

## 2023-08-30 DIAGNOSIS — D509 Iron deficiency anemia, unspecified: Secondary | ICD-10-CM | POA: Diagnosis not present

## 2023-09-01 DIAGNOSIS — D509 Iron deficiency anemia, unspecified: Secondary | ICD-10-CM | POA: Diagnosis not present

## 2023-09-01 DIAGNOSIS — D631 Anemia in chronic kidney disease: Secondary | ICD-10-CM | POA: Diagnosis not present

## 2023-09-01 DIAGNOSIS — Z992 Dependence on renal dialysis: Secondary | ICD-10-CM | POA: Diagnosis not present

## 2023-09-01 DIAGNOSIS — N2581 Secondary hyperparathyroidism of renal origin: Secondary | ICD-10-CM | POA: Diagnosis not present

## 2023-09-01 DIAGNOSIS — N186 End stage renal disease: Secondary | ICD-10-CM | POA: Diagnosis not present

## 2023-09-03 DIAGNOSIS — N186 End stage renal disease: Secondary | ICD-10-CM | POA: Diagnosis not present

## 2023-09-03 DIAGNOSIS — Z992 Dependence on renal dialysis: Secondary | ICD-10-CM | POA: Diagnosis not present

## 2023-09-03 DIAGNOSIS — N2581 Secondary hyperparathyroidism of renal origin: Secondary | ICD-10-CM | POA: Diagnosis not present

## 2023-09-03 DIAGNOSIS — D509 Iron deficiency anemia, unspecified: Secondary | ICD-10-CM | POA: Diagnosis not present

## 2023-09-03 DIAGNOSIS — D631 Anemia in chronic kidney disease: Secondary | ICD-10-CM | POA: Diagnosis not present

## 2023-09-06 DIAGNOSIS — N186 End stage renal disease: Secondary | ICD-10-CM | POA: Diagnosis not present

## 2023-09-06 DIAGNOSIS — D509 Iron deficiency anemia, unspecified: Secondary | ICD-10-CM | POA: Diagnosis not present

## 2023-09-06 DIAGNOSIS — Z992 Dependence on renal dialysis: Secondary | ICD-10-CM | POA: Diagnosis not present

## 2023-09-06 DIAGNOSIS — D631 Anemia in chronic kidney disease: Secondary | ICD-10-CM | POA: Diagnosis not present

## 2023-09-06 DIAGNOSIS — N2581 Secondary hyperparathyroidism of renal origin: Secondary | ICD-10-CM | POA: Diagnosis not present

## 2023-09-08 DIAGNOSIS — D509 Iron deficiency anemia, unspecified: Secondary | ICD-10-CM | POA: Diagnosis not present

## 2023-09-08 DIAGNOSIS — N186 End stage renal disease: Secondary | ICD-10-CM | POA: Diagnosis not present

## 2023-09-08 DIAGNOSIS — Z992 Dependence on renal dialysis: Secondary | ICD-10-CM | POA: Diagnosis not present

## 2023-09-08 DIAGNOSIS — D631 Anemia in chronic kidney disease: Secondary | ICD-10-CM | POA: Diagnosis not present

## 2023-09-08 DIAGNOSIS — N2581 Secondary hyperparathyroidism of renal origin: Secondary | ICD-10-CM | POA: Diagnosis not present

## 2023-09-10 ENCOUNTER — Other Ambulatory Visit: Payer: Self-pay | Admitting: Family Medicine

## 2023-09-10 DIAGNOSIS — N2581 Secondary hyperparathyroidism of renal origin: Secondary | ICD-10-CM | POA: Diagnosis not present

## 2023-09-10 DIAGNOSIS — Z7985 Type 2 diabetes mellitus without complications: Secondary | ICD-10-CM

## 2023-09-10 DIAGNOSIS — N186 End stage renal disease: Secondary | ICD-10-CM | POA: Diagnosis not present

## 2023-09-10 DIAGNOSIS — D509 Iron deficiency anemia, unspecified: Secondary | ICD-10-CM | POA: Diagnosis not present

## 2023-09-10 DIAGNOSIS — Z992 Dependence on renal dialysis: Secondary | ICD-10-CM | POA: Diagnosis not present

## 2023-09-10 DIAGNOSIS — D631 Anemia in chronic kidney disease: Secondary | ICD-10-CM | POA: Diagnosis not present

## 2023-09-13 DIAGNOSIS — D509 Iron deficiency anemia, unspecified: Secondary | ICD-10-CM | POA: Diagnosis not present

## 2023-09-13 DIAGNOSIS — N186 End stage renal disease: Secondary | ICD-10-CM | POA: Diagnosis not present

## 2023-09-13 DIAGNOSIS — D631 Anemia in chronic kidney disease: Secondary | ICD-10-CM | POA: Diagnosis not present

## 2023-09-13 DIAGNOSIS — Z992 Dependence on renal dialysis: Secondary | ICD-10-CM | POA: Diagnosis not present

## 2023-09-13 DIAGNOSIS — N2581 Secondary hyperparathyroidism of renal origin: Secondary | ICD-10-CM | POA: Diagnosis not present

## 2023-09-15 DIAGNOSIS — N186 End stage renal disease: Secondary | ICD-10-CM | POA: Diagnosis not present

## 2023-09-15 DIAGNOSIS — Z992 Dependence on renal dialysis: Secondary | ICD-10-CM | POA: Diagnosis not present

## 2023-09-15 DIAGNOSIS — D509 Iron deficiency anemia, unspecified: Secondary | ICD-10-CM | POA: Diagnosis not present

## 2023-09-15 DIAGNOSIS — D631 Anemia in chronic kidney disease: Secondary | ICD-10-CM | POA: Diagnosis not present

## 2023-09-15 DIAGNOSIS — N2581 Secondary hyperparathyroidism of renal origin: Secondary | ICD-10-CM | POA: Diagnosis not present

## 2023-09-17 DIAGNOSIS — D509 Iron deficiency anemia, unspecified: Secondary | ICD-10-CM | POA: Diagnosis not present

## 2023-09-17 DIAGNOSIS — Q612 Polycystic kidney, adult type: Secondary | ICD-10-CM | POA: Diagnosis not present

## 2023-09-17 DIAGNOSIS — Z992 Dependence on renal dialysis: Secondary | ICD-10-CM | POA: Diagnosis not present

## 2023-09-17 DIAGNOSIS — N186 End stage renal disease: Secondary | ICD-10-CM | POA: Diagnosis not present

## 2023-09-17 DIAGNOSIS — D631 Anemia in chronic kidney disease: Secondary | ICD-10-CM | POA: Diagnosis not present

## 2023-09-17 DIAGNOSIS — N2581 Secondary hyperparathyroidism of renal origin: Secondary | ICD-10-CM | POA: Diagnosis not present

## 2023-09-20 DIAGNOSIS — N186 End stage renal disease: Secondary | ICD-10-CM | POA: Diagnosis not present

## 2023-09-20 DIAGNOSIS — D631 Anemia in chronic kidney disease: Secondary | ICD-10-CM | POA: Diagnosis not present

## 2023-09-20 DIAGNOSIS — D509 Iron deficiency anemia, unspecified: Secondary | ICD-10-CM | POA: Diagnosis not present

## 2023-09-20 DIAGNOSIS — Z992 Dependence on renal dialysis: Secondary | ICD-10-CM | POA: Diagnosis not present

## 2023-09-20 DIAGNOSIS — N2581 Secondary hyperparathyroidism of renal origin: Secondary | ICD-10-CM | POA: Diagnosis not present

## 2023-09-22 DIAGNOSIS — N186 End stage renal disease: Secondary | ICD-10-CM | POA: Diagnosis not present

## 2023-09-22 DIAGNOSIS — D631 Anemia in chronic kidney disease: Secondary | ICD-10-CM | POA: Diagnosis not present

## 2023-09-22 DIAGNOSIS — N2581 Secondary hyperparathyroidism of renal origin: Secondary | ICD-10-CM | POA: Diagnosis not present

## 2023-09-22 DIAGNOSIS — Z992 Dependence on renal dialysis: Secondary | ICD-10-CM | POA: Diagnosis not present

## 2023-09-22 DIAGNOSIS — D509 Iron deficiency anemia, unspecified: Secondary | ICD-10-CM | POA: Diagnosis not present

## 2023-09-24 DIAGNOSIS — N186 End stage renal disease: Secondary | ICD-10-CM | POA: Diagnosis not present

## 2023-09-24 DIAGNOSIS — D631 Anemia in chronic kidney disease: Secondary | ICD-10-CM | POA: Diagnosis not present

## 2023-09-24 DIAGNOSIS — N2581 Secondary hyperparathyroidism of renal origin: Secondary | ICD-10-CM | POA: Diagnosis not present

## 2023-09-24 DIAGNOSIS — Z992 Dependence on renal dialysis: Secondary | ICD-10-CM | POA: Diagnosis not present

## 2023-09-24 DIAGNOSIS — D509 Iron deficiency anemia, unspecified: Secondary | ICD-10-CM | POA: Diagnosis not present

## 2023-09-27 DIAGNOSIS — D631 Anemia in chronic kidney disease: Secondary | ICD-10-CM | POA: Diagnosis not present

## 2023-09-27 DIAGNOSIS — Z992 Dependence on renal dialysis: Secondary | ICD-10-CM | POA: Diagnosis not present

## 2023-09-27 DIAGNOSIS — D509 Iron deficiency anemia, unspecified: Secondary | ICD-10-CM | POA: Diagnosis not present

## 2023-09-27 DIAGNOSIS — N186 End stage renal disease: Secondary | ICD-10-CM | POA: Diagnosis not present

## 2023-09-27 DIAGNOSIS — N2581 Secondary hyperparathyroidism of renal origin: Secondary | ICD-10-CM | POA: Diagnosis not present

## 2023-09-29 DIAGNOSIS — Z992 Dependence on renal dialysis: Secondary | ICD-10-CM | POA: Diagnosis not present

## 2023-09-29 DIAGNOSIS — D631 Anemia in chronic kidney disease: Secondary | ICD-10-CM | POA: Diagnosis not present

## 2023-09-29 DIAGNOSIS — N2581 Secondary hyperparathyroidism of renal origin: Secondary | ICD-10-CM | POA: Diagnosis not present

## 2023-09-29 DIAGNOSIS — N186 End stage renal disease: Secondary | ICD-10-CM | POA: Diagnosis not present

## 2023-09-29 DIAGNOSIS — D509 Iron deficiency anemia, unspecified: Secondary | ICD-10-CM | POA: Diagnosis not present

## 2023-10-01 DIAGNOSIS — N186 End stage renal disease: Secondary | ICD-10-CM | POA: Diagnosis not present

## 2023-10-01 DIAGNOSIS — Z992 Dependence on renal dialysis: Secondary | ICD-10-CM | POA: Diagnosis not present

## 2023-10-01 DIAGNOSIS — D631 Anemia in chronic kidney disease: Secondary | ICD-10-CM | POA: Diagnosis not present

## 2023-10-01 DIAGNOSIS — N2581 Secondary hyperparathyroidism of renal origin: Secondary | ICD-10-CM | POA: Diagnosis not present

## 2023-10-01 DIAGNOSIS — D509 Iron deficiency anemia, unspecified: Secondary | ICD-10-CM | POA: Diagnosis not present

## 2023-10-04 DIAGNOSIS — Z992 Dependence on renal dialysis: Secondary | ICD-10-CM | POA: Diagnosis not present

## 2023-10-04 DIAGNOSIS — D509 Iron deficiency anemia, unspecified: Secondary | ICD-10-CM | POA: Diagnosis not present

## 2023-10-04 DIAGNOSIS — N2581 Secondary hyperparathyroidism of renal origin: Secondary | ICD-10-CM | POA: Diagnosis not present

## 2023-10-04 DIAGNOSIS — D631 Anemia in chronic kidney disease: Secondary | ICD-10-CM | POA: Diagnosis not present

## 2023-10-04 DIAGNOSIS — N186 End stage renal disease: Secondary | ICD-10-CM | POA: Diagnosis not present

## 2023-10-06 DIAGNOSIS — N2581 Secondary hyperparathyroidism of renal origin: Secondary | ICD-10-CM | POA: Diagnosis not present

## 2023-10-06 DIAGNOSIS — N186 End stage renal disease: Secondary | ICD-10-CM | POA: Diagnosis not present

## 2023-10-06 DIAGNOSIS — D631 Anemia in chronic kidney disease: Secondary | ICD-10-CM | POA: Diagnosis not present

## 2023-10-06 DIAGNOSIS — Z992 Dependence on renal dialysis: Secondary | ICD-10-CM | POA: Diagnosis not present

## 2023-10-06 DIAGNOSIS — D509 Iron deficiency anemia, unspecified: Secondary | ICD-10-CM | POA: Diagnosis not present

## 2023-10-08 DIAGNOSIS — N2581 Secondary hyperparathyroidism of renal origin: Secondary | ICD-10-CM | POA: Diagnosis not present

## 2023-10-08 DIAGNOSIS — D631 Anemia in chronic kidney disease: Secondary | ICD-10-CM | POA: Diagnosis not present

## 2023-10-08 DIAGNOSIS — Z992 Dependence on renal dialysis: Secondary | ICD-10-CM | POA: Diagnosis not present

## 2023-10-08 DIAGNOSIS — N186 End stage renal disease: Secondary | ICD-10-CM | POA: Diagnosis not present

## 2023-10-08 DIAGNOSIS — D509 Iron deficiency anemia, unspecified: Secondary | ICD-10-CM | POA: Diagnosis not present

## 2023-10-11 DIAGNOSIS — N2581 Secondary hyperparathyroidism of renal origin: Secondary | ICD-10-CM | POA: Diagnosis not present

## 2023-10-11 DIAGNOSIS — N186 End stage renal disease: Secondary | ICD-10-CM | POA: Diagnosis not present

## 2023-10-11 DIAGNOSIS — D509 Iron deficiency anemia, unspecified: Secondary | ICD-10-CM | POA: Diagnosis not present

## 2023-10-11 DIAGNOSIS — D631 Anemia in chronic kidney disease: Secondary | ICD-10-CM | POA: Diagnosis not present

## 2023-10-11 DIAGNOSIS — Z992 Dependence on renal dialysis: Secondary | ICD-10-CM | POA: Diagnosis not present

## 2023-10-13 DIAGNOSIS — D509 Iron deficiency anemia, unspecified: Secondary | ICD-10-CM | POA: Diagnosis not present

## 2023-10-13 DIAGNOSIS — N186 End stage renal disease: Secondary | ICD-10-CM | POA: Diagnosis not present

## 2023-10-13 DIAGNOSIS — Z992 Dependence on renal dialysis: Secondary | ICD-10-CM | POA: Diagnosis not present

## 2023-10-13 DIAGNOSIS — N2581 Secondary hyperparathyroidism of renal origin: Secondary | ICD-10-CM | POA: Diagnosis not present

## 2023-10-13 DIAGNOSIS — D631 Anemia in chronic kidney disease: Secondary | ICD-10-CM | POA: Diagnosis not present

## 2023-10-15 DIAGNOSIS — N186 End stage renal disease: Secondary | ICD-10-CM | POA: Diagnosis not present

## 2023-10-15 DIAGNOSIS — D631 Anemia in chronic kidney disease: Secondary | ICD-10-CM | POA: Diagnosis not present

## 2023-10-15 DIAGNOSIS — D509 Iron deficiency anemia, unspecified: Secondary | ICD-10-CM | POA: Diagnosis not present

## 2023-10-15 DIAGNOSIS — Z992 Dependence on renal dialysis: Secondary | ICD-10-CM | POA: Diagnosis not present

## 2023-10-15 DIAGNOSIS — N2581 Secondary hyperparathyroidism of renal origin: Secondary | ICD-10-CM | POA: Diagnosis not present

## 2023-10-18 ENCOUNTER — Telehealth (INDEPENDENT_AMBULATORY_CARE_PROVIDER_SITE_OTHER): Payer: Self-pay

## 2023-10-18 ENCOUNTER — Other Ambulatory Visit (INDEPENDENT_AMBULATORY_CARE_PROVIDER_SITE_OTHER): Payer: Self-pay

## 2023-10-18 DIAGNOSIS — D631 Anemia in chronic kidney disease: Secondary | ICD-10-CM | POA: Diagnosis not present

## 2023-10-18 DIAGNOSIS — Q612 Polycystic kidney, adult type: Secondary | ICD-10-CM | POA: Diagnosis not present

## 2023-10-18 DIAGNOSIS — D509 Iron deficiency anemia, unspecified: Secondary | ICD-10-CM | POA: Diagnosis not present

## 2023-10-18 DIAGNOSIS — N2581 Secondary hyperparathyroidism of renal origin: Secondary | ICD-10-CM | POA: Diagnosis not present

## 2023-10-18 DIAGNOSIS — N186 End stage renal disease: Secondary | ICD-10-CM | POA: Diagnosis not present

## 2023-10-18 DIAGNOSIS — Z992 Dependence on renal dialysis: Secondary | ICD-10-CM | POA: Diagnosis not present

## 2023-10-18 MED ORDER — APIXABAN 2.5 MG PO TABS: 2.5000 mg | ORAL_TABLET | Freq: Two times a day (BID) | ORAL | 6 refills | Status: DC

## 2023-10-18 NOTE — Telephone Encounter (Signed)
 Medication refill for 2.5 Plavix BID. 60 qty with 6 refills

## 2023-10-20 DIAGNOSIS — Z992 Dependence on renal dialysis: Secondary | ICD-10-CM | POA: Diagnosis not present

## 2023-10-20 DIAGNOSIS — N2581 Secondary hyperparathyroidism of renal origin: Secondary | ICD-10-CM | POA: Diagnosis not present

## 2023-10-20 DIAGNOSIS — D509 Iron deficiency anemia, unspecified: Secondary | ICD-10-CM | POA: Diagnosis not present

## 2023-10-20 DIAGNOSIS — D631 Anemia in chronic kidney disease: Secondary | ICD-10-CM | POA: Diagnosis not present

## 2023-10-20 DIAGNOSIS — N186 End stage renal disease: Secondary | ICD-10-CM | POA: Diagnosis not present

## 2023-10-22 DIAGNOSIS — N2581 Secondary hyperparathyroidism of renal origin: Secondary | ICD-10-CM | POA: Diagnosis not present

## 2023-10-22 DIAGNOSIS — Z992 Dependence on renal dialysis: Secondary | ICD-10-CM | POA: Diagnosis not present

## 2023-10-22 DIAGNOSIS — N186 End stage renal disease: Secondary | ICD-10-CM | POA: Diagnosis not present

## 2023-10-22 DIAGNOSIS — D631 Anemia in chronic kidney disease: Secondary | ICD-10-CM | POA: Diagnosis not present

## 2023-10-22 DIAGNOSIS — D509 Iron deficiency anemia, unspecified: Secondary | ICD-10-CM | POA: Diagnosis not present

## 2023-10-25 DIAGNOSIS — N186 End stage renal disease: Secondary | ICD-10-CM | POA: Diagnosis not present

## 2023-10-25 DIAGNOSIS — D509 Iron deficiency anemia, unspecified: Secondary | ICD-10-CM | POA: Diagnosis not present

## 2023-10-25 DIAGNOSIS — Z992 Dependence on renal dialysis: Secondary | ICD-10-CM | POA: Diagnosis not present

## 2023-10-25 DIAGNOSIS — D631 Anemia in chronic kidney disease: Secondary | ICD-10-CM | POA: Diagnosis not present

## 2023-10-25 DIAGNOSIS — N2581 Secondary hyperparathyroidism of renal origin: Secondary | ICD-10-CM | POA: Diagnosis not present

## 2023-10-27 DIAGNOSIS — N186 End stage renal disease: Secondary | ICD-10-CM | POA: Diagnosis not present

## 2023-10-27 DIAGNOSIS — Z992 Dependence on renal dialysis: Secondary | ICD-10-CM | POA: Diagnosis not present

## 2023-10-27 DIAGNOSIS — D631 Anemia in chronic kidney disease: Secondary | ICD-10-CM | POA: Diagnosis not present

## 2023-10-27 DIAGNOSIS — D509 Iron deficiency anemia, unspecified: Secondary | ICD-10-CM | POA: Diagnosis not present

## 2023-10-27 DIAGNOSIS — N2581 Secondary hyperparathyroidism of renal origin: Secondary | ICD-10-CM | POA: Diagnosis not present

## 2023-10-29 DIAGNOSIS — D509 Iron deficiency anemia, unspecified: Secondary | ICD-10-CM | POA: Diagnosis not present

## 2023-10-29 DIAGNOSIS — D631 Anemia in chronic kidney disease: Secondary | ICD-10-CM | POA: Diagnosis not present

## 2023-10-29 DIAGNOSIS — Z992 Dependence on renal dialysis: Secondary | ICD-10-CM | POA: Diagnosis not present

## 2023-10-29 DIAGNOSIS — N2581 Secondary hyperparathyroidism of renal origin: Secondary | ICD-10-CM | POA: Diagnosis not present

## 2023-10-29 DIAGNOSIS — N186 End stage renal disease: Secondary | ICD-10-CM | POA: Diagnosis not present

## 2023-11-01 DIAGNOSIS — Z992 Dependence on renal dialysis: Secondary | ICD-10-CM | POA: Diagnosis not present

## 2023-11-01 DIAGNOSIS — N2581 Secondary hyperparathyroidism of renal origin: Secondary | ICD-10-CM | POA: Diagnosis not present

## 2023-11-01 DIAGNOSIS — N186 End stage renal disease: Secondary | ICD-10-CM | POA: Diagnosis not present

## 2023-11-01 DIAGNOSIS — D509 Iron deficiency anemia, unspecified: Secondary | ICD-10-CM | POA: Diagnosis not present

## 2023-11-01 DIAGNOSIS — D631 Anemia in chronic kidney disease: Secondary | ICD-10-CM | POA: Diagnosis not present

## 2023-11-02 ENCOUNTER — Encounter: Payer: Self-pay | Admitting: Family Medicine

## 2023-11-02 ENCOUNTER — Ambulatory Visit (INDEPENDENT_AMBULATORY_CARE_PROVIDER_SITE_OTHER): Admitting: Family Medicine

## 2023-11-02 DIAGNOSIS — E790 Hyperuricemia without signs of inflammatory arthritis and tophaceous disease: Secondary | ICD-10-CM

## 2023-11-02 DIAGNOSIS — E119 Type 2 diabetes mellitus without complications: Secondary | ICD-10-CM | POA: Diagnosis not present

## 2023-11-02 DIAGNOSIS — K219 Gastro-esophageal reflux disease without esophagitis: Secondary | ICD-10-CM

## 2023-11-02 DIAGNOSIS — Z7985 Long-term (current) use of injectable non-insulin antidiabetic drugs: Secondary | ICD-10-CM

## 2023-11-02 DIAGNOSIS — R635 Abnormal weight gain: Secondary | ICD-10-CM | POA: Diagnosis not present

## 2023-11-02 DIAGNOSIS — E782 Mixed hyperlipidemia: Secondary | ICD-10-CM | POA: Diagnosis not present

## 2023-11-02 DIAGNOSIS — R609 Edema, unspecified: Secondary | ICD-10-CM

## 2023-11-02 MED ORDER — ALLOPURINOL 100 MG PO TABS: 100.0000 mg | ORAL_TABLET | Freq: Every day | ORAL | 1 refills | Status: AC

## 2023-11-02 MED ORDER — FUROSEMIDE 20 MG PO TABS: 20.0000 mg | ORAL_TABLET | Freq: Two times a day (BID) | ORAL | 1 refills | Status: AC

## 2023-11-02 MED ORDER — TRULICITY 0.75 MG/0.5ML ~~LOC~~ SOAJ
SUBCUTANEOUS | 4 refills | Status: AC
Start: 2023-11-02 — End: ?

## 2023-11-02 MED ORDER — PANTOPRAZOLE SODIUM 40 MG PO TBEC: 40.0000 mg | DELAYED_RELEASE_TABLET | Freq: Every day | ORAL | 1 refills | Status: AC

## 2023-11-02 MED ORDER — LOVASTATIN 20 MG PO TABS: 20.0000 mg | ORAL_TABLET | Freq: Every day | ORAL | 1 refills | Status: AC

## 2023-11-02 NOTE — Progress Notes (Addendum)
 Date:  11/02/2023   Name:  Mark Bauer   DOB:  1956/02/03   MRN:  [id number]   Chief Complaint: Hyperuricemia, Edema, Diabetes, Hyperlipidemia, and Gastroesophageal Reflux  Diabetes He presents for his follow-up diabetic visit. He has type 2 diabetes mellitus. His disease course has been stable. There are no hypoglycemic associated symptoms. There are no diabetic associated symptoms. Pertinent negatives for diabetes include no chest pain, no fatigue and no weight loss. There are no hypoglycemic complications. Symptoms are stable. Diabetic complications include nephropathy. Pertinent negatives for diabetic complications include no CVA, heart disease or retinopathy. Current diabetic treatments: noninsulin injection. His weight is stable. Meal planning includes avoidance of concentrated sweets and carbohydrate counting. His home blood glucose trend is fluctuating minimally. His breakfast blood glucose range is generally 90-110 mg/dl. Eye exam is current.  Gastroesophageal Reflux He reports no abdominal pain, no belching, no chest pain, no choking, no coughing, no dysphagia, no heartburn, no hoarse voice, no nausea, no sore throat, no water brash or no wheezing. This is a chronic problem. The current episode started more than 1 year ago. The problem has been gradually improving. The symptoms are aggravated by certain foods. Pertinent negatives include no anemia, fatigue, melena, muscle weakness, orthopnea or weight loss. There are no known risk factors. He has tried a PPI for the symptoms. The treatment provided moderate relief.  Hyperlipidemia This is a chronic problem. The problem is controlled. Recent lipid tests were reviewed and are normal. Exacerbating diseases include chronic renal disease. Pertinent negatives include no chest pain, focal sensory loss, focal weakness, leg pain, myalgias or shortness of breath. Current antihyperlipidemic treatment includes statins. There are no compliance  problems.  Risk factors for coronary artery disease include dyslipidemia and hypertension.    Lab Results  Component Value Date   NA 141 07/15/2023   K 4.1 07/15/2023   CO2 31 (H) 07/15/2023   GLUCOSE 101 (H) 07/15/2023   BUN 63 (H) 07/15/2023   CREATININE 7.89 (H) 07/15/2023   CALCIUM 9.8 07/15/2023   EGFR 7 (L) 07/15/2023   GFRNONAA 8 (L) 02/16/2020   Lab Results  Component Value Date   CHOL 139 07/15/2023   HDL 69 07/15/2023   LDLCALC 61 07/15/2023   TRIG 32 07/15/2023   CHOLHDL 3.7 12/16/2016   Lab Results  Component Value Date   TSH 1.430 07/15/2023   Lab Results  Component Value Date   HGBA1C 5.3 07/15/2023   Lab Results  Component Value Date   WBC 5.9 09/12/2021   HGB 10.0 (L) 09/12/2021   HCT 30.8 (L) 09/12/2021   MCV 103 (H) 09/12/2021   PLT 119 (L) 09/12/2021   Lab Results  Component Value Date   ALT 10 07/15/2023   AST 17 07/15/2023   ALKPHOS 89 07/15/2023   BILITOT 0.5 07/15/2023   Lab Results  Component Value Date   VD25OH 51.7 07/15/2023     Review of Systems  Constitutional:  Negative for fatigue, unexpected weight change and weight loss.  HENT:  Negative for hoarse voice, sinus pressure, sore throat and trouble swallowing.   Eyes:  Negative for visual disturbance.  Respiratory:  Negative for cough, choking, shortness of breath and wheezing.   Cardiovascular:  Negative for chest pain.  Gastrointestinal:  Negative for abdominal pain, blood in stool, constipation, dysphagia, heartburn, melena and nausea.  Musculoskeletal:  Negative for myalgias and muscle weakness.  Neurological:  Negative for focal weakness.    Patient Active Problem List  Diagnosis Date Noted   Right foot drop 01/05/2023   End stage renal disease (HCC) 06/04/2021   Anticoagulated 05/12/2021   Complication of vascular access for dialysis 06/06/2020   Fistula 02/23/2020   Polycystic liver disease 01/10/2020   Polycystic kidney disease 03/01/2019   Metabolic acidosis  09/13/2018   CKD stage 4 due to type 2 diabetes mellitus (HCC) 01/25/2018   Type 2 diabetes mellitus with diabetic nephropathy, without long-term current use of insulin (HCC) 01/25/2018   Secondary hyperparathyroidism of renal origin (HCC) 08/19/2017   Chronic atrial fibrillation (HCC) 01/06/2017   Essential hypertension 01/06/2017   Hyperuricemia 01/06/2017   Mixed hyperlipidemia 01/06/2017   Gastroesophageal reflux disease 01/06/2017   Obesity 12/26/2012    Allergies  Allergen Reactions   Ciprofloxacin Other (See Comments)    hallucination  Other Reaction(s): Unknown  Other Reaction(s): Hallucination  hallucination  Other Reaction(s): Unknown   Pollen Extract Other (See Comments)    Sneezing, watery eyes, etc.  Other Reaction(s): Unknown   Caffeine Palpitations    palpatations    Past Surgical History:  Procedure Laterality Date   A/V FISTULAGRAM Left 12/15/2022   Procedure: A/V Fistulagram;  Surgeon: Renford Dills, MD;  Location: [facility];  Service: Cardiovascular;  Laterality: Left;   BASCILIC VEIN TRANSPOSITION Left 02/23/2020   Procedure: BASCILIC VEIN TRANSPOSITION (SINGLE STAGE);  Surgeon: Renford Dills, MD;  Location: [facility] ORS;  Service: Vascular;  Laterality: Left;   COLONOSCOPY  2012   hemorrhoids   melanoma removal     forehead    Social History   Tobacco Use   Smoking status: Never   Smokeless tobacco: Never  Vaping Use   Vaping status: Never Used  Substance Use Topics   Alcohol use: No   Drug use: No     Medication list has been reviewed and updated.  Current Meds  Medication Sig   allopurinol (ZYLOPRIM) 100 MG tablet Take 1 tablet (100 mg total) by mouth daily.   apixaban (ELIQUIS) 2.5 MG TABS tablet Take 1 tablet (2.5 mg total) by mouth 2 (two) times daily.   blood glucose meter kit and supplies Dispense based on patient and insurance preference. Use up to four times daily as directed. (FOR ICD-10 E11.9).   calcitRIOL  (ROCALTROL) 0.5 MCG capsule Take 0.5 mcg by mouth once a week. 3x week   cinacalcet (SENSIPAR) 30 MG tablet Take 30 mg by mouth once a week. 3 x week   cyanocobalamin 1000 MCG tablet Take 1,000 mcg by mouth daily.   Dulaglutide (TRULICITY) 0.75 MG/0.5ML SOAJ INJECT 0.75 MG SUBCUTANEOUSLY ONE TIME PER WEEK   furosemide (LASIX) 20 MG tablet Take 1 tablet (20 mg total) by mouth 2 (two) times daily.   IRON SUCROSE IV Inject 50 mg into the vein. Weekly   Lancets (ONETOUCH DELICA PLUS LANCET30G) MISC USE AS DIRECTED EVERY DAY   loratadine (CLARITIN) 10 MG tablet Take 10 mg by mouth at bedtime. otc   lovastatin (MEVACOR) 20 MG tablet Take 1 tablet (20 mg total) by mouth daily.   Methoxy PEG-Epoetin Beta (MIRCERA) 30 MCG/0.3ML SOSY Inject 30 mcg as directed every 30 (thirty) days.   ONETOUCH ULTRA test strip ONE TEST DAILY   pantoprazole (PROTONIX) 40 MG tablet Take 1 tablet (40 mg total) by mouth daily.   sevelamer carbonate (RENVELA) 800 MG tablet Take 800 mg by mouth 3 (three) times daily with meals.   spironolactone (ALDACTONE) 25 MG tablet Take 25 mg by mouth daily.   [  DISCONTINUED] amLODipine (NORVASC) 10 MG tablet Take 10 mg by mouth daily.       11/02/2023    9:38 AM 07/15/2023    9:15 AM 01/14/2023   10:42 AM 09/14/2022    9:48 AM  GAD 7 : Generalized Anxiety Score  Nervous, Anxious, on Edge 0 0 0 0  Control/stop worrying 0 0 0 0  Worry too much - different things 0 0 0 0  Trouble relaxing 0 0 0 0  Restless 0 0 0 0  Easily annoyed or irritable 0 0 0 0  Afraid - awful might happen 0 0 0 0  Total GAD 7 Score 0 0 0 0  Anxiety Difficulty  Not difficult at all Not difficult at all Not difficult at all       11/02/2023    9:38 AM 07/15/2023    9:15 AM 01/28/2023   10:29 AM  Depression screen PHQ 2/9  Decreased Interest 0 1 0  Down, Depressed, Hopeless 0 1 1  PHQ - 2 Score 0 2 1  Altered sleeping  0 0  Tired, decreased energy  2 0  Change in appetite  0 0  Feeling bad or failure  about yourself   0 0  Trouble concentrating  0 0  Moving slowly or fidgety/restless  0 0  Suicidal thoughts  0 0  PHQ-9 Score  4 1  Difficult doing work/chores  Somewhat difficult Not difficult at all    BP Readings from Last 3 Encounters:  11/02/23 116/68  07/15/23 119/68  05/27/23 93/63    Physical Exam Vitals and nursing note reviewed.  Constitutional:      Appearance: He is well-developed.  HENT:     Head: Normocephalic and atraumatic.     Right Ear: Tympanic membrane, ear canal and external ear normal.     Left Ear: Tympanic membrane, ear canal and external ear normal.     Nose: Nose normal.     Mouth/Throat:     Dentition: Normal dentition.  Eyes:     General: Lids are normal. No scleral icterus.    Conjunctiva/sclera: Conjunctivae normal.     Pupils: Pupils are equal, round, and reactive to light.  Neck:     Thyroid: No thyromegaly.     Vascular: No carotid bruit, hepatojugular reflux or JVD.     Trachea: No tracheal deviation.  Cardiovascular:     Rate and Rhythm: Normal rate and regular rhythm.     Heart sounds: Normal heart sounds. No murmur heard.    No friction rub. No gallop.  Pulmonary:     Effort: Pulmonary effort is normal.     Breath sounds: Normal breath sounds. No wheezing, rhonchi or rales.  Abdominal:     General: Bowel sounds are normal.     Palpations: Abdomen is soft. There is no hepatomegaly, splenomegaly or mass.     Tenderness: There is no abdominal tenderness.  Musculoskeletal:        General: Normal range of motion.     Cervical back: Normal range of motion and neck supple.  Lymphadenopathy:     Cervical: No cervical adenopathy.  Skin:    General: Skin is warm and dry.     Findings: No rash.  Neurological:     Mental Status: He is alert.  Psychiatric:        Mood and Affect: Mood is not anxious or depressed.     Wt Readings from Last 3 Encounters:  11/02/23 159 lb  9.6 oz (72.4 kg)  07/15/23 161 lb 3.2 oz (73.1 kg)  05/27/23  158 lb 3.2 oz (71.8 kg)    BP 116/68   Pulse (!) 101   Resp 16   Ht 6' (1.829 m)   Wt 159 lb 9.6 oz (72.4 kg)   SpO2 100%   BMI 21.65 kg/m   Assessment and Plan: 1. Diabetes mellitus treated with injections of non-insulin medication (HCC) Chronic.  Controlled.  Stable.  Asymptomatic.  Tolerating medications well.  Currently on Trulicity 0.75 injected once a week tolerating well we will continue at current dosing will check A1c for current level of control fasting sugars seem to be in good range between 0100-0110.  Will recheck in 4 months.  Patient will be establishing care with Dr. [facility]y [facility]. - Comprehensive metabolic panel with GFR - CBC with Differential/Platelet - Hemoglobin A1c - Dulaglutide (TRULICITY) 0.75 MG/0.5ML SOAJ; INJECT 0.75 MG SUBCUTANEOUSLY ONE TIME PER WEEK  Dispense: 6 mL; Refill: 4  2. Gastroesophageal reflux disease, unspecified whether esophagitis present Chronic.  Controlled.  Stable.  Asymptomatic.  Tolerating current dosing of pantoprazole 40 mg once a day.  Will continue at current dosing. - pantoprazole (PROTONIX) 40 MG tablet; Take 1 tablet (40 mg total) by mouth daily.  Dispense: 90 tablet; Refill: 1  3. Mixed hyperlipidemia Chronic.  Controlled.  Stable.  Asymptomatic.  Without myalgias without muscle weakness continue lovastatin 20 mg once a day.  Will check CMP for electrolytes and GFR and hepatic concerns.  Lipid panel for LDL control. - Comprehensive metabolic panel with GFR - Lipid Panel With LDL/HDL Ratio - lovastatin (MEVACOR) 20 MG tablet; Take 1 tablet (20 mg total) by mouth daily.  Dispense: 90 tablet; Refill: 1  4. Weight gain with edema Chronic.  Controlled.  Stable.  Patient has occasional weight gain secondary to fluid retention from CKD controlled with furosemide 1 tablet twice a day. - furosemide (LASIX) 20 MG tablet; Take 1 tablet (20 mg total) by mouth 2 (two) times daily.  Dispense: 180 tablet; Refill: 1  5.  Hyperuricemia Chronic.  Controlled.  Stable.  Adjustment serum uric acid with allopurinol 100 mg nightly. - allopurinol (ZYLOPRIM) 100 MG tablet; Take 1 tablet (100 mg total) by mouth daily.  Dispense: 90 tablet; Refill: 1     Alayne Allis, MD

## 2023-11-03 ENCOUNTER — Encounter: Payer: Self-pay | Admitting: Family Medicine

## 2023-11-03 DIAGNOSIS — D631 Anemia in chronic kidney disease: Secondary | ICD-10-CM | POA: Diagnosis not present

## 2023-11-03 DIAGNOSIS — Z992 Dependence on renal dialysis: Secondary | ICD-10-CM | POA: Diagnosis not present

## 2023-11-03 DIAGNOSIS — N2581 Secondary hyperparathyroidism of renal origin: Secondary | ICD-10-CM | POA: Diagnosis not present

## 2023-11-03 DIAGNOSIS — D509 Iron deficiency anemia, unspecified: Secondary | ICD-10-CM | POA: Diagnosis not present

## 2023-11-03 DIAGNOSIS — N186 End stage renal disease: Secondary | ICD-10-CM | POA: Diagnosis not present

## 2023-11-03 LAB — COMPREHENSIVE METABOLIC PANEL WITH GFR
ALT: 13 IU/L (ref 0–44)
AST: 20 IU/L (ref 0–40)
Albumin: 4.4 g/dL (ref 3.9–4.9)
Alkaline Phosphatase: 96 IU/L (ref 44–121)
BUN/Creatinine Ratio: 6 — ABNORMAL LOW (ref 10–24)
BUN: 35 mg/dL — ABNORMAL HIGH (ref 8–27)
Bilirubin Total: 0.5 mg/dL (ref 0.0–1.2)
CO2: 28 mmol/L (ref 20–29)
Calcium: 10.4 mg/dL — ABNORMAL HIGH (ref 8.6–10.2)
Chloride: 94 mmol/L — ABNORMAL LOW (ref 96–106)
Creatinine, Ser: 5.98 mg/dL — ABNORMAL HIGH (ref 0.76–1.27)
Globulin, Total: 1.8 g/dL (ref 1.5–4.5)
Glucose: 94 mg/dL (ref 70–99)
Potassium: 4.2 mmol/L (ref 3.5–5.2)
Sodium: 142 mmol/L (ref 134–144)
Total Protein: 6.2 g/dL (ref 6.0–8.5)
eGFR: 10 mL/min/{1.73_m2} — ABNORMAL LOW (ref >59)

## 2023-11-03 LAB — CBC WITH DIFFERENTIAL/PLATELET
Basophils Absolute: 0 10*3/uL (ref 0.0–0.2)
Basos: 1 %
EOS (ABSOLUTE): 0.1 10*3/uL (ref 0.0–0.4)
Eos: 1 %
Hematocrit: 32.6 % — ABNORMAL LOW (ref 37.5–51.0)
Hemoglobin: 11.2 g/dL — ABNORMAL LOW (ref 13.0–17.7)
Immature Grans (Abs): 0 10*3/uL (ref 0.0–0.1)
Immature Granulocytes: 0 %
Lymphocytes Absolute: 0.9 10*3/uL (ref 0.7–3.1)
Lymphs: 19 %
MCH: 38 pg — ABNORMAL HIGH (ref 26.6–33.0)
MCHC: 34.4 g/dL (ref 31.5–35.7)
MCV: 111 fL — ABNORMAL HIGH (ref 79–97)
Monocytes Absolute: 0.5 10*3/uL (ref 0.1–0.9)
Monocytes: 11 %
Neutrophils Absolute: 3.1 10*3/uL (ref 1.4–7.0)
Neutrophils: 68 %
Platelets: 109 10*3/uL — ABNORMAL LOW (ref 150–450)
RBC: 2.95 x10E6/uL — ABNORMAL LOW (ref 4.14–5.80)
RDW: 12.3 % (ref 11.6–15.4)
WBC: 4.6 10*3/uL (ref 3.4–10.8)

## 2023-11-03 LAB — HEMOGLOBIN A1C
Est. average glucose Bld gHb Est-mCnc: 94 mg/dL
Hgb A1c MFr Bld: 4.9 % (ref 4.8–5.6)

## 2023-11-03 LAB — LIPID PANEL WITH LDL/HDL RATIO
Cholesterol, Total: 127 mg/dL (ref 100–199)
HDL: 62 mg/dL (ref >39)
LDL Chol Calc (NIH): 50 mg/dL (ref 0–99)
LDL/HDL Ratio: 0.8 ratio (ref 0.0–3.6)
Triglycerides: 75 mg/dL (ref 0–149)
VLDL Cholesterol Cal: 15 mg/dL (ref 5–40)

## 2023-11-03 NOTE — Telephone Encounter (Signed)
PT response

## 2023-11-04 ENCOUNTER — Ambulatory Visit: Payer: Self-pay | Admitting: Family Medicine

## 2023-11-05 DIAGNOSIS — N186 End stage renal disease: Secondary | ICD-10-CM | POA: Diagnosis not present

## 2023-11-05 DIAGNOSIS — Z992 Dependence on renal dialysis: Secondary | ICD-10-CM | POA: Diagnosis not present

## 2023-11-05 DIAGNOSIS — N2581 Secondary hyperparathyroidism of renal origin: Secondary | ICD-10-CM | POA: Diagnosis not present

## 2023-11-05 DIAGNOSIS — D509 Iron deficiency anemia, unspecified: Secondary | ICD-10-CM | POA: Diagnosis not present

## 2023-11-05 DIAGNOSIS — D631 Anemia in chronic kidney disease: Secondary | ICD-10-CM | POA: Diagnosis not present

## 2023-11-08 DIAGNOSIS — D631 Anemia in chronic kidney disease: Secondary | ICD-10-CM | POA: Diagnosis not present

## 2023-11-08 DIAGNOSIS — D509 Iron deficiency anemia, unspecified: Secondary | ICD-10-CM | POA: Diagnosis not present

## 2023-11-08 DIAGNOSIS — Z992 Dependence on renal dialysis: Secondary | ICD-10-CM | POA: Diagnosis not present

## 2023-11-08 DIAGNOSIS — N186 End stage renal disease: Secondary | ICD-10-CM | POA: Diagnosis not present

## 2023-11-08 DIAGNOSIS — N2581 Secondary hyperparathyroidism of renal origin: Secondary | ICD-10-CM | POA: Diagnosis not present

## 2023-11-10 DIAGNOSIS — N186 End stage renal disease: Secondary | ICD-10-CM | POA: Diagnosis not present

## 2023-11-10 DIAGNOSIS — D631 Anemia in chronic kidney disease: Secondary | ICD-10-CM | POA: Diagnosis not present

## 2023-11-10 DIAGNOSIS — N2581 Secondary hyperparathyroidism of renal origin: Secondary | ICD-10-CM | POA: Diagnosis not present

## 2023-11-10 DIAGNOSIS — D509 Iron deficiency anemia, unspecified: Secondary | ICD-10-CM | POA: Diagnosis not present

## 2023-11-10 DIAGNOSIS — Z992 Dependence on renal dialysis: Secondary | ICD-10-CM | POA: Diagnosis not present

## 2023-11-12 DIAGNOSIS — N2581 Secondary hyperparathyroidism of renal origin: Secondary | ICD-10-CM | POA: Diagnosis not present

## 2023-11-12 DIAGNOSIS — N186 End stage renal disease: Secondary | ICD-10-CM | POA: Diagnosis not present

## 2023-11-12 DIAGNOSIS — D631 Anemia in chronic kidney disease: Secondary | ICD-10-CM | POA: Diagnosis not present

## 2023-11-12 DIAGNOSIS — Z992 Dependence on renal dialysis: Secondary | ICD-10-CM | POA: Diagnosis not present

## 2023-11-12 DIAGNOSIS — D509 Iron deficiency anemia, unspecified: Secondary | ICD-10-CM | POA: Diagnosis not present

## 2023-11-15 DIAGNOSIS — N186 End stage renal disease: Secondary | ICD-10-CM | POA: Diagnosis not present

## 2023-11-15 DIAGNOSIS — N2581 Secondary hyperparathyroidism of renal origin: Secondary | ICD-10-CM | POA: Diagnosis not present

## 2023-11-15 DIAGNOSIS — D509 Iron deficiency anemia, unspecified: Secondary | ICD-10-CM | POA: Diagnosis not present

## 2023-11-15 DIAGNOSIS — Z992 Dependence on renal dialysis: Secondary | ICD-10-CM | POA: Diagnosis not present

## 2023-11-15 DIAGNOSIS — D631 Anemia in chronic kidney disease: Secondary | ICD-10-CM | POA: Diagnosis not present

## 2023-11-17 DIAGNOSIS — Q612 Polycystic kidney, adult type: Secondary | ICD-10-CM | POA: Diagnosis not present

## 2023-11-17 DIAGNOSIS — N2581 Secondary hyperparathyroidism of renal origin: Secondary | ICD-10-CM | POA: Diagnosis not present

## 2023-11-17 DIAGNOSIS — D509 Iron deficiency anemia, unspecified: Secondary | ICD-10-CM | POA: Diagnosis not present

## 2023-11-17 DIAGNOSIS — D631 Anemia in chronic kidney disease: Secondary | ICD-10-CM | POA: Diagnosis not present

## 2023-11-17 DIAGNOSIS — Z992 Dependence on renal dialysis: Secondary | ICD-10-CM | POA: Diagnosis not present

## 2023-11-17 DIAGNOSIS — N186 End stage renal disease: Secondary | ICD-10-CM | POA: Diagnosis not present

## 2023-11-19 DIAGNOSIS — Z992 Dependence on renal dialysis: Secondary | ICD-10-CM | POA: Diagnosis not present

## 2023-11-19 DIAGNOSIS — D631 Anemia in chronic kidney disease: Secondary | ICD-10-CM | POA: Diagnosis not present

## 2023-11-19 DIAGNOSIS — N2581 Secondary hyperparathyroidism of renal origin: Secondary | ICD-10-CM | POA: Diagnosis not present

## 2023-11-19 DIAGNOSIS — N186 End stage renal disease: Secondary | ICD-10-CM | POA: Diagnosis not present

## 2023-11-22 DIAGNOSIS — N186 End stage renal disease: Secondary | ICD-10-CM | POA: Diagnosis not present

## 2023-11-22 DIAGNOSIS — Z992 Dependence on renal dialysis: Secondary | ICD-10-CM | POA: Diagnosis not present

## 2023-11-22 DIAGNOSIS — D631 Anemia in chronic kidney disease: Secondary | ICD-10-CM | POA: Diagnosis not present

## 2023-11-22 DIAGNOSIS — N2581 Secondary hyperparathyroidism of renal origin: Secondary | ICD-10-CM | POA: Diagnosis not present

## 2023-11-24 DIAGNOSIS — Z992 Dependence on renal dialysis: Secondary | ICD-10-CM | POA: Diagnosis not present

## 2023-11-24 DIAGNOSIS — N186 End stage renal disease: Secondary | ICD-10-CM | POA: Diagnosis not present

## 2023-11-24 DIAGNOSIS — N2581 Secondary hyperparathyroidism of renal origin: Secondary | ICD-10-CM | POA: Diagnosis not present

## 2023-11-24 DIAGNOSIS — D631 Anemia in chronic kidney disease: Secondary | ICD-10-CM | POA: Diagnosis not present

## 2023-11-26 DIAGNOSIS — D631 Anemia in chronic kidney disease: Secondary | ICD-10-CM | POA: Diagnosis not present

## 2023-11-26 DIAGNOSIS — N186 End stage renal disease: Secondary | ICD-10-CM | POA: Diagnosis not present

## 2023-11-26 DIAGNOSIS — Z992 Dependence on renal dialysis: Secondary | ICD-10-CM | POA: Diagnosis not present

## 2023-11-26 DIAGNOSIS — N2581 Secondary hyperparathyroidism of renal origin: Secondary | ICD-10-CM | POA: Diagnosis not present

## 2023-11-29 DIAGNOSIS — N2581 Secondary hyperparathyroidism of renal origin: Secondary | ICD-10-CM | POA: Diagnosis not present

## 2023-11-29 DIAGNOSIS — N186 End stage renal disease: Secondary | ICD-10-CM | POA: Diagnosis not present

## 2023-11-29 DIAGNOSIS — D631 Anemia in chronic kidney disease: Secondary | ICD-10-CM | POA: Diagnosis not present

## 2023-11-29 DIAGNOSIS — Z992 Dependence on renal dialysis: Secondary | ICD-10-CM | POA: Diagnosis not present

## 2023-12-01 DIAGNOSIS — N186 End stage renal disease: Secondary | ICD-10-CM | POA: Diagnosis not present

## 2023-12-01 DIAGNOSIS — N2581 Secondary hyperparathyroidism of renal origin: Secondary | ICD-10-CM | POA: Diagnosis not present

## 2023-12-01 DIAGNOSIS — D631 Anemia in chronic kidney disease: Secondary | ICD-10-CM | POA: Diagnosis not present

## 2023-12-01 DIAGNOSIS — Z992 Dependence on renal dialysis: Secondary | ICD-10-CM | POA: Diagnosis not present

## 2023-12-03 DIAGNOSIS — N2581 Secondary hyperparathyroidism of renal origin: Secondary | ICD-10-CM | POA: Diagnosis not present

## 2023-12-03 DIAGNOSIS — D631 Anemia in chronic kidney disease: Secondary | ICD-10-CM | POA: Diagnosis not present

## 2023-12-03 DIAGNOSIS — Z992 Dependence on renal dialysis: Secondary | ICD-10-CM | POA: Diagnosis not present

## 2023-12-03 DIAGNOSIS — N186 End stage renal disease: Secondary | ICD-10-CM | POA: Diagnosis not present

## 2023-12-06 DIAGNOSIS — N2581 Secondary hyperparathyroidism of renal origin: Secondary | ICD-10-CM | POA: Diagnosis not present

## 2023-12-06 DIAGNOSIS — Z992 Dependence on renal dialysis: Secondary | ICD-10-CM | POA: Diagnosis not present

## 2023-12-06 DIAGNOSIS — D631 Anemia in chronic kidney disease: Secondary | ICD-10-CM | POA: Diagnosis not present

## 2023-12-06 DIAGNOSIS — N186 End stage renal disease: Secondary | ICD-10-CM | POA: Diagnosis not present

## 2023-12-07 ENCOUNTER — Encounter (INDEPENDENT_AMBULATORY_CARE_PROVIDER_SITE_OTHER): Payer: Self-pay

## 2023-12-08 DIAGNOSIS — N2581 Secondary hyperparathyroidism of renal origin: Secondary | ICD-10-CM | POA: Diagnosis not present

## 2023-12-08 DIAGNOSIS — D631 Anemia in chronic kidney disease: Secondary | ICD-10-CM | POA: Diagnosis not present

## 2023-12-08 DIAGNOSIS — N186 End stage renal disease: Secondary | ICD-10-CM | POA: Diagnosis not present

## 2023-12-08 DIAGNOSIS — Z992 Dependence on renal dialysis: Secondary | ICD-10-CM | POA: Diagnosis not present

## 2023-12-10 DIAGNOSIS — D631 Anemia in chronic kidney disease: Secondary | ICD-10-CM | POA: Diagnosis not present

## 2023-12-10 DIAGNOSIS — N2581 Secondary hyperparathyroidism of renal origin: Secondary | ICD-10-CM | POA: Diagnosis not present

## 2023-12-10 DIAGNOSIS — Z992 Dependence on renal dialysis: Secondary | ICD-10-CM | POA: Diagnosis not present

## 2023-12-10 DIAGNOSIS — N186 End stage renal disease: Secondary | ICD-10-CM | POA: Diagnosis not present

## 2023-12-13 DIAGNOSIS — D631 Anemia in chronic kidney disease: Secondary | ICD-10-CM | POA: Diagnosis not present

## 2023-12-13 DIAGNOSIS — N2581 Secondary hyperparathyroidism of renal origin: Secondary | ICD-10-CM | POA: Diagnosis not present

## 2023-12-13 DIAGNOSIS — N186 End stage renal disease: Secondary | ICD-10-CM | POA: Diagnosis not present

## 2023-12-13 DIAGNOSIS — Z992 Dependence on renal dialysis: Secondary | ICD-10-CM | POA: Diagnosis not present

## 2023-12-15 DIAGNOSIS — N2581 Secondary hyperparathyroidism of renal origin: Secondary | ICD-10-CM | POA: Diagnosis not present

## 2023-12-15 DIAGNOSIS — Z992 Dependence on renal dialysis: Secondary | ICD-10-CM | POA: Diagnosis not present

## 2023-12-15 DIAGNOSIS — D631 Anemia in chronic kidney disease: Secondary | ICD-10-CM | POA: Diagnosis not present

## 2023-12-15 DIAGNOSIS — N186 End stage renal disease: Secondary | ICD-10-CM | POA: Diagnosis not present

## 2023-12-17 DIAGNOSIS — N186 End stage renal disease: Secondary | ICD-10-CM | POA: Diagnosis not present

## 2023-12-17 DIAGNOSIS — Z992 Dependence on renal dialysis: Secondary | ICD-10-CM | POA: Diagnosis not present

## 2023-12-17 DIAGNOSIS — D631 Anemia in chronic kidney disease: Secondary | ICD-10-CM | POA: Diagnosis not present

## 2023-12-17 DIAGNOSIS — N2581 Secondary hyperparathyroidism of renal origin: Secondary | ICD-10-CM | POA: Diagnosis not present

## 2023-12-18 DIAGNOSIS — N186 End stage renal disease: Secondary | ICD-10-CM | POA: Diagnosis not present

## 2023-12-18 DIAGNOSIS — Q612 Polycystic kidney, adult type: Secondary | ICD-10-CM | POA: Diagnosis not present

## 2023-12-18 DIAGNOSIS — Z992 Dependence on renal dialysis: Secondary | ICD-10-CM | POA: Diagnosis not present

## 2023-12-20 DIAGNOSIS — D631 Anemia in chronic kidney disease: Secondary | ICD-10-CM | POA: Diagnosis not present

## 2023-12-20 DIAGNOSIS — Z992 Dependence on renal dialysis: Secondary | ICD-10-CM | POA: Diagnosis not present

## 2023-12-20 DIAGNOSIS — N186 End stage renal disease: Secondary | ICD-10-CM | POA: Diagnosis not present

## 2023-12-20 DIAGNOSIS — N2581 Secondary hyperparathyroidism of renal origin: Secondary | ICD-10-CM | POA: Diagnosis not present

## 2023-12-20 DIAGNOSIS — E119 Type 2 diabetes mellitus without complications: Secondary | ICD-10-CM | POA: Diagnosis not present

## 2023-12-22 DIAGNOSIS — N186 End stage renal disease: Secondary | ICD-10-CM | POA: Diagnosis not present

## 2023-12-22 DIAGNOSIS — D631 Anemia in chronic kidney disease: Secondary | ICD-10-CM | POA: Diagnosis not present

## 2023-12-22 DIAGNOSIS — Z992 Dependence on renal dialysis: Secondary | ICD-10-CM | POA: Diagnosis not present

## 2023-12-22 DIAGNOSIS — N2581 Secondary hyperparathyroidism of renal origin: Secondary | ICD-10-CM | POA: Diagnosis not present

## 2023-12-24 DIAGNOSIS — N186 End stage renal disease: Secondary | ICD-10-CM | POA: Diagnosis not present

## 2023-12-24 DIAGNOSIS — N2581 Secondary hyperparathyroidism of renal origin: Secondary | ICD-10-CM | POA: Diagnosis not present

## 2023-12-24 DIAGNOSIS — Z992 Dependence on renal dialysis: Secondary | ICD-10-CM | POA: Diagnosis not present

## 2023-12-24 DIAGNOSIS — D631 Anemia in chronic kidney disease: Secondary | ICD-10-CM | POA: Diagnosis not present

## 2023-12-27 DIAGNOSIS — N2581 Secondary hyperparathyroidism of renal origin: Secondary | ICD-10-CM | POA: Diagnosis not present

## 2023-12-27 DIAGNOSIS — N186 End stage renal disease: Secondary | ICD-10-CM | POA: Diagnosis not present

## 2023-12-27 DIAGNOSIS — Z992 Dependence on renal dialysis: Secondary | ICD-10-CM | POA: Diagnosis not present

## 2023-12-27 DIAGNOSIS — D631 Anemia in chronic kidney disease: Secondary | ICD-10-CM | POA: Diagnosis not present

## 2023-12-29 DIAGNOSIS — Z992 Dependence on renal dialysis: Secondary | ICD-10-CM | POA: Diagnosis not present

## 2023-12-29 DIAGNOSIS — N186 End stage renal disease: Secondary | ICD-10-CM | POA: Diagnosis not present

## 2023-12-29 DIAGNOSIS — D631 Anemia in chronic kidney disease: Secondary | ICD-10-CM | POA: Diagnosis not present

## 2023-12-29 DIAGNOSIS — N2581 Secondary hyperparathyroidism of renal origin: Secondary | ICD-10-CM | POA: Diagnosis not present

## 2023-12-30 ENCOUNTER — Telehealth (INDEPENDENT_AMBULATORY_CARE_PROVIDER_SITE_OTHER): Payer: Self-pay

## 2023-12-30 NOTE — Telephone Encounter (Signed)
 Spoke with the patient and he is scheduled with Dr. Prescilla Brod for a left arm fistulagram on 01/11/24 with a 12:00 pm arrival time to the [facility]. Pre-procedure instructions were discussed and will be sent to Mychart and mailed.

## 2023-12-31 DIAGNOSIS — Z992 Dependence on renal dialysis: Secondary | ICD-10-CM | POA: Diagnosis not present

## 2023-12-31 DIAGNOSIS — N186 End stage renal disease: Secondary | ICD-10-CM | POA: Diagnosis not present

## 2023-12-31 DIAGNOSIS — N2581 Secondary hyperparathyroidism of renal origin: Secondary | ICD-10-CM | POA: Diagnosis not present

## 2023-12-31 DIAGNOSIS — D631 Anemia in chronic kidney disease: Secondary | ICD-10-CM | POA: Diagnosis not present

## 2024-01-03 DIAGNOSIS — Z992 Dependence on renal dialysis: Secondary | ICD-10-CM | POA: Diagnosis not present

## 2024-01-03 DIAGNOSIS — N186 End stage renal disease: Secondary | ICD-10-CM | POA: Diagnosis not present

## 2024-01-03 DIAGNOSIS — N2581 Secondary hyperparathyroidism of renal origin: Secondary | ICD-10-CM | POA: Diagnosis not present

## 2024-01-03 DIAGNOSIS — D631 Anemia in chronic kidney disease: Secondary | ICD-10-CM | POA: Diagnosis not present

## 2024-01-04 DIAGNOSIS — N186 End stage renal disease: Secondary | ICD-10-CM | POA: Diagnosis not present

## 2024-01-04 DIAGNOSIS — Z1211 Encounter for screening for malignant neoplasm of colon: Secondary | ICD-10-CM | POA: Diagnosis not present

## 2024-01-04 DIAGNOSIS — Z7901 Long term (current) use of anticoagulants: Secondary | ICD-10-CM | POA: Diagnosis not present

## 2024-01-04 DIAGNOSIS — E1122 Type 2 diabetes mellitus with diabetic chronic kidney disease: Secondary | ICD-10-CM | POA: Diagnosis not present

## 2024-01-04 DIAGNOSIS — I482 Chronic atrial fibrillation, unspecified: Secondary | ICD-10-CM | POA: Diagnosis not present

## 2024-01-04 DIAGNOSIS — Z992 Dependence on renal dialysis: Secondary | ICD-10-CM | POA: Diagnosis not present

## 2024-01-04 DIAGNOSIS — Z1331 Encounter for screening for depression: Secondary | ICD-10-CM | POA: Diagnosis not present

## 2024-01-04 DIAGNOSIS — E782 Mixed hyperlipidemia: Secondary | ICD-10-CM | POA: Diagnosis not present

## 2024-01-04 DIAGNOSIS — Z125 Encounter for screening for malignant neoplasm of prostate: Secondary | ICD-10-CM | POA: Diagnosis not present

## 2024-01-04 DIAGNOSIS — I120 Hypertensive chronic kidney disease with stage 5 chronic kidney disease or end stage renal disease: Secondary | ICD-10-CM | POA: Diagnosis not present

## 2024-01-05 DIAGNOSIS — N2581 Secondary hyperparathyroidism of renal origin: Secondary | ICD-10-CM | POA: Diagnosis not present

## 2024-01-05 DIAGNOSIS — D631 Anemia in chronic kidney disease: Secondary | ICD-10-CM | POA: Diagnosis not present

## 2024-01-05 DIAGNOSIS — N186 End stage renal disease: Secondary | ICD-10-CM | POA: Diagnosis not present

## 2024-01-05 DIAGNOSIS — Z992 Dependence on renal dialysis: Secondary | ICD-10-CM | POA: Diagnosis not present

## 2024-01-07 DIAGNOSIS — N2581 Secondary hyperparathyroidism of renal origin: Secondary | ICD-10-CM | POA: Diagnosis not present

## 2024-01-07 DIAGNOSIS — D631 Anemia in chronic kidney disease: Secondary | ICD-10-CM | POA: Diagnosis not present

## 2024-01-07 DIAGNOSIS — Z992 Dependence on renal dialysis: Secondary | ICD-10-CM | POA: Diagnosis not present

## 2024-01-07 DIAGNOSIS — N186 End stage renal disease: Secondary | ICD-10-CM | POA: Diagnosis not present

## 2024-01-10 DIAGNOSIS — N2581 Secondary hyperparathyroidism of renal origin: Secondary | ICD-10-CM | POA: Diagnosis not present

## 2024-01-10 DIAGNOSIS — Z992 Dependence on renal dialysis: Secondary | ICD-10-CM | POA: Diagnosis not present

## 2024-01-10 DIAGNOSIS — N186 End stage renal disease: Secondary | ICD-10-CM | POA: Diagnosis not present

## 2024-01-10 DIAGNOSIS — D631 Anemia in chronic kidney disease: Secondary | ICD-10-CM | POA: Diagnosis not present

## 2024-01-10 DIAGNOSIS — Z1211 Encounter for screening for malignant neoplasm of colon: Secondary | ICD-10-CM | POA: Diagnosis not present

## 2024-01-11 ENCOUNTER — Encounter
Admission: RE | Disposition: A | Discharge status: Home / Self Care | Payer: Self-pay | Source: Home / Self Care | Attending: Vascular Surgery

## 2024-01-11 ENCOUNTER — Encounter: Payer: Self-pay | Admitting: Vascular Surgery

## 2024-01-11 ENCOUNTER — Other Ambulatory Visit: Payer: Self-pay

## 2024-01-11 ENCOUNTER — Ambulatory Visit
Admission: RE | Admit: 2024-01-11 | Discharge: 2024-01-11 | Disposition: A | Discharge status: Home / Self Care | Attending: Vascular Surgery | Admitting: Vascular Surgery

## 2024-01-11 DIAGNOSIS — I482 Chronic atrial fibrillation, unspecified: Secondary | ICD-10-CM | POA: Diagnosis not present

## 2024-01-11 DIAGNOSIS — E1140 Type 2 diabetes mellitus with diabetic neuropathy, unspecified: Secondary | ICD-10-CM | POA: Insufficient documentation

## 2024-01-11 DIAGNOSIS — Y832 Surgical operation with anastomosis, bypass or graft as the cause of abnormal reaction of the patient, or of later complication, without mention of misadventure at the time of the procedure: Secondary | ICD-10-CM | POA: Diagnosis not present

## 2024-01-11 DIAGNOSIS — Z7984 Long term (current) use of oral hypoglycemic drugs: Secondary | ICD-10-CM | POA: Insufficient documentation

## 2024-01-11 DIAGNOSIS — K219 Gastro-esophageal reflux disease without esophagitis: Secondary | ICD-10-CM | POA: Insufficient documentation

## 2024-01-11 DIAGNOSIS — I120 Hypertensive chronic kidney disease with stage 5 chronic kidney disease or end stage renal disease: Secondary | ICD-10-CM | POA: Insufficient documentation

## 2024-01-11 DIAGNOSIS — Z7901 Long term (current) use of anticoagulants: Secondary | ICD-10-CM | POA: Insufficient documentation

## 2024-01-11 DIAGNOSIS — E1122 Type 2 diabetes mellitus with diabetic chronic kidney disease: Secondary | ICD-10-CM | POA: Insufficient documentation

## 2024-01-11 DIAGNOSIS — T82898A Other specified complication of vascular prosthetic devices, implants and grafts, initial encounter: Secondary | ICD-10-CM

## 2024-01-11 DIAGNOSIS — Z992 Dependence on renal dialysis: Secondary | ICD-10-CM | POA: Diagnosis not present

## 2024-01-11 DIAGNOSIS — N186 End stage renal disease: Secondary | ICD-10-CM

## 2024-01-11 HISTORY — PX: A/V FISTULAGRAM: CATH118298

## 2024-01-11 LAB — POTASSIUM (ARMC VASCULAR LAB ONLY): Potassium (ARMC vascular lab): 4.1 mmol/L (ref 3.5–5.1)

## 2024-01-11 LAB — GLUCOSE, CAPILLARY: Glucose-Capillary: 84 mg/dL (ref 70–99)

## 2024-01-11 SURGERY — A/V FISTULAGRAM
Anesthesia: Moderate Sedation | Laterality: Left

## 2024-01-11 MED ORDER — LIDOCAINE HCL (PF) 1 % IJ SOLN
INTRAMUSCULAR | Status: DC | PRN
  Administered 2024-01-11: 5 mL

## 2024-01-11 MED ORDER — FENTANYL CITRATE (PF) 100 MCG/2ML IJ SOLN
INTRAMUSCULAR | Status: DC | PRN
  Administered 2024-01-11: 50 ug via INTRAVENOUS

## 2024-01-11 MED ORDER — HEPARIN SODIUM (PORCINE) 1000 UNIT/ML IJ SOLN
INTRAMUSCULAR | Status: DC | PRN
Start: 2024-01-11 — End: 2024-01-11
  Administered 2024-01-11: 4000 [IU] via INTRAVENOUS

## 2024-01-11 MED ORDER — MIDAZOLAM HCL 2 MG/2ML IJ SOLN
INTRAMUSCULAR | Status: DC | PRN
  Administered 2024-01-11: 2 mg via INTRAVENOUS

## 2024-01-11 MED ORDER — CEFAZOLIN SODIUM-DEXTROSE 1-4 GM/50ML-% IV SOLN
1.0000 g | INTRAVENOUS | Status: AC
  Administered 2024-01-11: 1 g via INTRAVENOUS

## 2024-01-11 MED ORDER — METHYLPREDNISOLONE SODIUM SUCC 125 MG IJ SOLR: 125.0000 mg | Freq: Once | INTRAMUSCULAR | Status: DC | PRN

## 2024-01-11 MED ORDER — HYDROMORPHONE HCL 1 MG/ML IJ SOLN: 1.0000 mg | Freq: Once | INTRAMUSCULAR | Status: DC | PRN

## 2024-01-11 MED ORDER — FAMOTIDINE 20 MG PO TABS: 40.0000 mg | ORAL_TABLET | Freq: Once | ORAL | Status: DC | PRN

## 2024-01-11 MED ORDER — SODIUM CHLORIDE 0.9 % IV SOLN: INTRAVENOUS | Status: DC

## 2024-01-11 MED ORDER — MIDAZOLAM HCL 2 MG/ML PO SYRP: 8.0000 mg | ORAL_SOLUTION | Freq: Once | ORAL | Status: DC | PRN

## 2024-01-11 MED ORDER — HEPARIN SODIUM (PORCINE) 1000 UNIT/ML IJ SOLN
INTRAMUSCULAR | Status: AC
  Filled 2024-01-11: qty 10

## 2024-01-11 MED ORDER — ONDANSETRON HCL 4 MG/2ML IJ SOLN: 4.0000 mg | Freq: Four times a day (QID) | INTRAMUSCULAR | Status: DC | PRN

## 2024-01-11 MED ORDER — MIDAZOLAM HCL 2 MG/2ML IJ SOLN
INTRAMUSCULAR | Status: AC
  Filled 2024-01-11: qty 2

## 2024-01-11 MED ORDER — HEPARIN (PORCINE) IN NACL 1000-0.9 UT/500ML-% IV SOLN
INTRAVENOUS | Status: DC | PRN
  Administered 2024-01-11: 500 mL

## 2024-01-11 MED ORDER — IODIXANOL 320 MG/ML IV SOLN
INTRAVENOUS | Status: DC | PRN
Start: 2024-01-11 — End: 2024-01-11
  Administered 2024-01-11: 20 mL

## 2024-01-11 MED ORDER — DIPHENHYDRAMINE HCL 50 MG/ML IJ SOLN: 50.0000 mg | Freq: Once | INTRAMUSCULAR | Status: DC | PRN

## 2024-01-11 MED ORDER — FENTANYL CITRATE PF 50 MCG/ML IJ SOSY
PREFILLED_SYRINGE | INTRAMUSCULAR | Status: AC
  Filled 2024-01-11: qty 1

## 2024-01-11 MED ORDER — CEFAZOLIN SODIUM-DEXTROSE 1-4 GM/50ML-% IV SOLN
INTRAVENOUS | Status: AC
  Filled 2024-01-11: qty 50

## 2024-01-11 SURGICAL SUPPLY — 12 items
BALLOON DORADO 8X60X80 (BALLOONS) IMPLANT
BALLOON LUTONIX DCB 7X40X130 (BALLOONS) IMPLANT
COVER PROBE ULTRASOUND 5X96 (MISCELLANEOUS) IMPLANT
DEVICE PRESTO INFLATION (MISCELLANEOUS) IMPLANT
DRAPE BRACHIAL (DRAPES) IMPLANT
GOWN STRL REUS W/ TWL LRG LVL3 (GOWN DISPOSABLE) ×1 IMPLANT
NDL ENTRY 21GA 7CM ECHOTIP (NEEDLE) IMPLANT
NEEDLE ENTRY 21GA 7CM ECHOTIP (NEEDLE) ×1 IMPLANT
PACK ANGIOGRAPHY (CUSTOM PROCEDURE TRAY) ×1 IMPLANT
SET INTRO CAPELLA COAXIAL (SET/KITS/TRAYS/PACK) IMPLANT
SHEATH BRITE TIP 6FRX5.5 (SHEATH) IMPLANT
SUT MNCRL AB 4-0 PS2 18 (SUTURE) IMPLANT

## 2024-01-11 NOTE — Discharge Instructions (Signed)
 Shuntogram, Care After Refer to this sheet in the next few weeks. These instructions provide you with information on caring for yourself after your procedure. Your health care provider may also give you more specific instructions. Your treatment has been planned according to current medical practices, but problems sometimes occur. Call your health care provider if you have any problems or questions after your procedure. What can I expect after the procedure? After your procedure, it is typical to have the following:  A small amount of discomfort in the area where the catheters were placed.  A small amount of bruising around the fistula.  Sleepiness and fatigue.  Follow these instructions at home:  Rest at home for the day following your procedure.  Do not drive or operate heavy machinery while taking pain medicine.  Take medicines only as directed by your health care provider.  Do not take baths, swim, or use a hot tub until your health care provider approves. You may shower 24 hours after the procedure or as directed by your health care provider.  There are many different ways to close and cover an incision, including stitches, skin glue, and adhesive strips. Follow your health care provider's instructions on: ? Incision care. ? Bandage (dressing) changes and removal. ? Incision closure removal.  Monitor your dialysis fistula carefully. Contact a health care provider if:  You have drainage, redness, swelling, or pain at your catheter site.  You have a fever.  You have chills. Get help right away if:  You feel weak.  You have trouble balancing.  You have trouble moving your arms or legs.  You have problems with your speech or vision.  You can no longer feel a vibration or buzz when you put your fingers over your dialysis fistula.  The limb that was used for the procedure: ? Swells. ? Is painful. ? Is cold. ? Is discolored, such as blue or pale white. This  information is not intended to replace advice given to you by your health care provider. Make sure you discuss any questions you have with your health care provider. Document Released: 11/20/2013 Document Revised: 12/12/2015 Document Reviewed: 08/25/2013 Elsevier Interactive Patient Education  2018 [employer].

## 2024-01-11 NOTE — Interval H&P Note (Signed)
 History and Physical Interval Note:  01/11/2024 1:42 PM  Mark Bauer  has presented today for surgery, with the diagnosis of L Arm Fistulagram   End Stage Renal.  The various methods of treatment have been discussed with the patient and family. After consideration of risks, benefits and other options for treatment, the patient has consented to  Procedure(s): A/V Fistulagram (Left) as a surgical intervention.  The patient's history has been reviewed, patient examined, no change in status, stable for surgery.  I have reviewed the patient's chart and labs.  Questions were answered to the patient's satisfaction.     Cordella Shawl

## 2024-01-11 NOTE — Op Note (Signed)
 OPERATIVE NOTE   PROCEDURE: Contrast injection left arm AV access Percutaneous transluminal angioplasty venous outflow left arm brachiobasilic fistula  PRE-OPERATIVE DIAGNOSIS: Complication of dialysis access                                                       End Stage Renal Disease  POST-OPERATIVE DIAGNOSIS: same as above   SURGEON: Cordella JUDITHANN Shawl, M.D.  ANESTHESIA: Conscious sedation was administered under my direct supervision by the interventional radiology RN. IV Versed  plus fentanyl  were utilized. Continuous ECG, pulse oximetry and blood pressure was monitored throughout the entire procedure.  Conscious sedation was for a total of 31 minutes and 28 seconds.  ESTIMATED BLOOD LOSS: minimal  FINDING(S): Stricture of the AV graft  SPECIMEN(S):  None  CONTRAST: 20 cc  FLUOROSCOPY TIME: 0.9 minutes  INDICATIONS: Mark Bauer is a 68 y.o. male who  presents with malfunctioning left arm AV access.  The patient is scheduled for angiography with possible intervention of the AV access.  The patient is aware the risks include but are not limited to: bleeding, infection, thrombosis of the cannulated access, and possible anaphylactic reaction to the contrast.  The patient acknowledges if the access can not be salvaged a tunneled catheter will be needed and will be placed during this procedure.  The patient is aware of the risks of the procedure and elects to proceed with the angiogram and intervention.  DESCRIPTION: After full informed written consent was obtained, the patient was brought back to the Special Procedure suite and placed supine position.  Appropriate cardiopulmonary monitors were placed.  The left arm was prepped and draped in the standard fashion.  Appropriate timeout is called. The leftbrachiobasilic fistula was cannulated with a micropuncture needle.  Cannulation was performed with ultrasound guidance. Ultrasound was placed in a sterile sleeve, the AV access was  interrogated and noted to be echolucent and compressible indicating patency. Image was recorded for the permanent record. The puncture is performed under continuous ultrasound visualization.   The microwire was advanced and the needle was exchanged for  a microsheath.  The J-wire was then advanced and a 6 Fr sheath inserted.  Hand injections were completed to image the access from the arterial anastomosis through the entire access.  The central venous structures were also imaged by hand injections.  Based on the images,  4000 units of heparin  was given and a wire was negotiated through the stricture within the venous outflow portion of the graft.  First a 7 mm x 40 mm Lutonix drug-eluting balloon was used to angioplasty the lesion.  Inflation was to 10 atm for 1 minute.  Next an 8 mm x 60 mm Dorado balloon was used.  Inflation was to 10 atm for 1 minute.  Follow-up imaging demonstrates complete resolution of the stricture with rapid flow of contrast through the graft, the central venous anatomy is preserved.  A 4-0 Monocryl purse-string suture was sewn around the sheath.  The sheath was removed and light pressure was applied.  A sterile bandage was applied to the puncture site.    COMPLICATIONS: None  CONDITION: Metta Cordella JUDITHANN Shawl, M.D [facility] Office: [phone]  01/11/2024 2:36 PM

## 2024-01-11 NOTE — H&P (View-Only) (Signed)
 MRN : [id number]  Mark Bauer is a 68 y.o. (11/19/1955) male who presents with chief complaint of check access.  History of Present Illness:   Patient presents to [facility] today for evaluation of his AV access.  We were contacted by his dialysis center.  The patient notes a significant increase in problems with dialysis.  It is reported that adequate dialysis is not being achieved.    The patient denies hand pain or other symptoms consistent with steal phenomena.  No significant arm swelling.  The patient denies redness or swelling at the access site. The patient denies fever or chills at home or while on dialysis.  No recent shortening of the patient's walking distance or new symptoms consistent with claudication.  No history of rest pain symptoms. No new ulcers or wounds of the lower extremities have occurred.  The patient denies amaurosis fugax or recent TIA symptoms. There are no recent neurological changes noted. There is no history of DVT, PE or superficial thrombophlebitis. No recent episodes of angina or shortness of breath documented.    Current Meds  Medication Sig   allopurinol  (ZYLOPRIM ) 100 MG tablet Take 1 tablet (100 mg total) by mouth daily.   furosemide  (LASIX ) 20 MG tablet Take 1 tablet (20 mg total) by mouth 2 (two) times daily.   IRON SUCROSE  IV Inject 50 mg into the vein. Weekly   loratadine (CLARITIN) 10 MG tablet Take 10 mg by mouth at bedtime. otc   lovastatin  (MEVACOR ) 20 MG tablet Take 1 tablet (20 mg total) by mouth daily.   Methoxy PEG-Epoetin  Beta (MIRCERA) 30 MCG/0.3ML SOSY Inject 30 mcg as directed every 30 (thirty) days.   pantoprazole  (PROTONIX ) 40 MG tablet Take 1 tablet (40 mg total) by mouth daily.   sevelamer carbonate (RENVELA) 800 MG tablet Take 800 mg by mouth 3 (three) times daily with meals.   spironolactone (ALDACTONE) 25 MG tablet Take 25 mg by mouth daily.    Past Medical History:   Diagnosis Date   Allergy    Bilateral undescended testicles    Cancer (HCC)    melanoma on forehead   Cataract    OU   Diabetes mellitus without complication (HCC)    Dysrhythmia    a fib   GERD (gastroesophageal reflux disease)    Gout    Hepatitis    polycystic    Hypertension    Hypokalemia    Mixed hyperlipidemia    Polycystic kidney    Polycystic liver disease    Vitamin D  deficiency     Past Surgical History:  Procedure Laterality Date   A/V FISTULAGRAM Left 12/15/2022   Procedure: A/V Fistulagram;  Surgeon: Jama Cordella MATSU, MD;  Location: [facility];  Service: Cardiovascular;  Laterality: Left;   BASCILIC VEIN TRANSPOSITION Left 02/23/2020   Procedure: BASCILIC VEIN TRANSPOSITION (SINGLE STAGE);  Surgeon: Jama Cordella MATSU, MD;  Location: [facility];  Service: Vascular;  Laterality: Left;   COLONOSCOPY  2012   hemorrhoids   melanoma removal     forehead    Social History Social History   Tobacco Use   Smoking status: Never   Smokeless tobacco: Never  Vaping Use   Vaping status: Never Used  Substance Use Topics   Alcohol use: No   Drug use: No    Family History Family History  Problem Relation  Age of Onset   Diabetes Mother    Heart disease Father     Allergies  Allergen Reactions   Ciprofloxacin Other (See Comments)    hallucination  Other Reaction(s): Unknown  Other Reaction(s): Hallucination  hallucination  Other Reaction(s): Unknown   Pollen Extract Other (See Comments)    Sneezing, watery eyes, etc.  Other Reaction(s): Unknown   Caffeine Palpitations    palpatations     REVIEW OF SYSTEMS (Negative unless checked)  Constitutional: [] Weight loss  [] Fever  [] Chills Cardiac: [] Chest pain   [] Chest pressure   [] Palpitations   [] Shortness of breath when laying flat   [] Shortness of breath with exertion. Vascular:  [] Pain in legs with walking   [] Pain in legs at rest  [] History of DVT   [] Phlebitis   [] Swelling in legs    [] Varicose veins   [] Non-healing ulcers Pulmonary:   [] Uses home oxygen   [] Productive cough   [] Hemoptysis   [] Wheeze  [] COPD   [] Asthma Neurologic:  [] Dizziness   [] Seizures   [] History of stroke   [] History of TIA  [] Aphasia   [] Vissual changes   [] Weakness or numbness in arm   [] Weakness or numbness in leg Musculoskeletal:   [] Joint swelling   [] Joint pain   [] Low back pain Hematologic:  [] Easy bruising  [] Easy bleeding   [] Hypercoagulable state   [] Anemic Gastrointestinal:  [] Diarrhea   [] Vomiting  [] Gastroesophageal reflux/heartburn   [] Difficulty swallowing. Genitourinary:  [x] Chronic kidney disease   [] Difficult urination  [] Frequent urination   [] Blood in urine Skin:  [] Rashes   [] Ulcers  Psychological:  [] History of anxiety   []  History of major depression.  Physical Examination  Vitals:   01/11/24 1232  BP: 111/62  Pulse: 90  Resp: (!) 22  Temp: 98.1 F (36.7 C)  SpO2: 97%  Weight: 73 kg  Height: 6' (1.829 m)   Body mass index is 21.84 kg/m. Gen: WD/WN, NAD Head: [place]/AT, No temporalis wasting.  Ear/Nose/Throat: Hearing grossly intact, nares w/o erythema or drainage Eyes: PER, EOMI, sclera nonicteric.  Neck: Supple, no gross masses or lesions.  No JVD.  Pulmonary:  Good air movement, no audible wheezing, no use of accessory muscles.  Cardiac: RRR, precordium non-hyperdynamic. Vascular:    Left brachiobasilic fistula weak thrill poor bruit, skin appears healthy and intact  Vessel Right Left  Radial Palpable Palpable  Brachial Palpable Palpable  Gastrointestinal: soft, non-distended. No guarding/no peritoneal signs.  Musculoskeletal: M/S 5/5 throughout.  No deformity.  Neurologic: CN 2-12 intact. Pain and light touch intact in extremities.  Symmetrical.  Speech is fluent. Motor exam as listed above. Psychiatric: Judgment intact, Mood & affect appropriate for pt's clinical situation. Dermatologic: No rashes or ulcers noted.  No changes consistent with  cellulitis.   CBC Lab Results  Component Value Date   WBC 4.6 11/02/2023   HGB 11.2 (L) 11/02/2023   HCT 32.6 (L) 11/02/2023   MCV 111 (H) 11/02/2023   PLT 109 (L) 11/02/2023    BMET    Component Value Date/Time   NA 142 11/02/2023 1003   K 4.2 11/02/2023 1003   CL 94 (L) 11/02/2023 1003   CO2 28 11/02/2023 1003   GLUCOSE 94 11/02/2023 1003   GLUCOSE 109 (H) 02/16/2020 1434   BUN 35 (H) 11/02/2023 1003   CREATININE 5.98 (H) 11/02/2023 1003   CALCIUM 10.4 (H) 11/02/2023 1003   GFRNONAA 8 (L) 02/16/2020 1434   GFRAA 9 (L) 02/16/2020 1434   CrCl cannot be calculated (Patient's  most recent lab result is older than the maximum 21 days allowed.).  COAG Lab Results  Component Value Date   INR 1.1 05/12/2021   INR 1.2 04/16/2021   INR 2.6 (H) 04/11/2021    Radiology No results found.   Assessment/Plan 1. End stage renal disease (HCC) Recommend:  The patient is experiencing increasing problems with their dialysis access.  Patient should have a fistulagram with the intention for intervention.  The intention for intervention is to restore appropriate flow and prevent thrombosis and possible loss of the access.  As well as improve the quality of dialysis therapy.  The risks, benefits and alternative therapies were reviewed in detail with the patient.  All questions were answered.  The patient agrees to proceed with angio/intervention.    The patient will follow up with me in the office after the procedure.    2. Chronic atrial fibrillation (HCC) Continue antiarrhythmia medications as already ordered, these medications have been reviewed and there are no changes at this time.   Continue anticoagulation as ordered by Cardiology Service   3. Essential hypertension Continue antihypertensive medications as already ordered, these medications have been reviewed and there are no changes at this time.   4. Gastroesophageal reflux disease, unspecified whether esophagitis  present Continue PPI as already ordered, this medication has been reviewed and there are no changes at this time.   Avoidence of caffeine and alcohol   Moderate elevation of the head of the bed    5. Type 2 diabetes mellitus with diabetic nephropathy, without long-term current use of insulin  (HCC) Continue hypoglycemic medications as already ordered, these medications have been reviewed and there are no changes at this time.   Hgb A1C to be monitored as already arranged by primary service   Cordella Shawl, MD  01/11/2024 1:37 PM

## 2024-01-11 NOTE — Progress Notes (Signed)
 MRN : [id number]  Mark Bauer is a 68 y.o. (11/19/1955) male who presents with chief complaint of check access.  History of Present Illness:   Patient presents to [facility] today for evaluation of his AV access.  We were contacted by his dialysis center.  The patient notes a significant increase in problems with dialysis.  It is reported that adequate dialysis is not being achieved.    The patient denies hand pain or other symptoms consistent with steal phenomena.  No significant arm swelling.  The patient denies redness or swelling at the access site. The patient denies fever or chills at home or while on dialysis.  No recent shortening of the patient's walking distance or new symptoms consistent with claudication.  No history of rest pain symptoms. No new ulcers or wounds of the lower extremities have occurred.  The patient denies amaurosis fugax or recent TIA symptoms. There are no recent neurological changes noted. There is no history of DVT, PE or superficial thrombophlebitis. No recent episodes of angina or shortness of breath documented.    Current Meds  Medication Sig   allopurinol  (ZYLOPRIM ) 100 MG tablet Take 1 tablet (100 mg total) by mouth daily.   furosemide  (LASIX ) 20 MG tablet Take 1 tablet (20 mg total) by mouth 2 (two) times daily.   IRON SUCROSE  IV Inject 50 mg into the vein. Weekly   loratadine (CLARITIN) 10 MG tablet Take 10 mg by mouth at bedtime. otc   lovastatin  (MEVACOR ) 20 MG tablet Take 1 tablet (20 mg total) by mouth daily.   Methoxy PEG-Epoetin  Beta (MIRCERA) 30 MCG/0.3ML SOSY Inject 30 mcg as directed every 30 (thirty) days.   pantoprazole  (PROTONIX ) 40 MG tablet Take 1 tablet (40 mg total) by mouth daily.   sevelamer carbonate (RENVELA) 800 MG tablet Take 800 mg by mouth 3 (three) times daily with meals.   spironolactone (ALDACTONE) 25 MG tablet Take 25 mg by mouth daily.    Past Medical History:   Diagnosis Date   Allergy    Bilateral undescended testicles    Cancer (HCC)    melanoma on forehead   Cataract    OU   Diabetes mellitus without complication (HCC)    Dysrhythmia    a fib   GERD (gastroesophageal reflux disease)    Gout    Hepatitis    polycystic    Hypertension    Hypokalemia    Mixed hyperlipidemia    Polycystic kidney    Polycystic liver disease    Vitamin D  deficiency     Past Surgical History:  Procedure Laterality Date   A/V FISTULAGRAM Left 12/15/2022   Procedure: A/V Fistulagram;  Surgeon: Jama Cordella MATSU, MD;  Location: [facility];  Service: Cardiovascular;  Laterality: Left;   BASCILIC VEIN TRANSPOSITION Left 02/23/2020   Procedure: BASCILIC VEIN TRANSPOSITION (SINGLE STAGE);  Surgeon: Jama Cordella MATSU, MD;  Location: [facility];  Service: Vascular;  Laterality: Left;   COLONOSCOPY  2012   hemorrhoids   melanoma removal     forehead    Social History Social History   Tobacco Use   Smoking status: Never   Smokeless tobacco: Never  Vaping Use   Vaping status: Never Used  Substance Use Topics   Alcohol use: No   Drug use: No    Family History Family History  Problem Relation  Age of Onset   Diabetes Mother    Heart disease Father     Allergies  Allergen Reactions   Ciprofloxacin Other (See Comments)    hallucination  Other Reaction(s): Unknown  Other Reaction(s): Hallucination  hallucination  Other Reaction(s): Unknown   Pollen Extract Other (See Comments)    Sneezing, watery eyes, etc.  Other Reaction(s): Unknown   Caffeine Palpitations    palpatations     REVIEW OF SYSTEMS (Negative unless checked)  Constitutional: [] Weight loss  [] Fever  [] Chills Cardiac: [] Chest pain   [] Chest pressure   [] Palpitations   [] Shortness of breath when laying flat   [] Shortness of breath with exertion. Vascular:  [] Pain in legs with walking   [] Pain in legs at rest  [] History of DVT   [] Phlebitis   [] Swelling in legs    [] Varicose veins   [] Non-healing ulcers Pulmonary:   [] Uses home oxygen   [] Productive cough   [] Hemoptysis   [] Wheeze  [] COPD   [] Asthma Neurologic:  [] Dizziness   [] Seizures   [] History of stroke   [] History of TIA  [] Aphasia   [] Vissual changes   [] Weakness or numbness in arm   [] Weakness or numbness in leg Musculoskeletal:   [] Joint swelling   [] Joint pain   [] Low back pain Hematologic:  [] Easy bruising  [] Easy bleeding   [] Hypercoagulable state   [] Anemic Gastrointestinal:  [] Diarrhea   [] Vomiting  [] Gastroesophageal reflux/heartburn   [] Difficulty swallowing. Genitourinary:  [x] Chronic kidney disease   [] Difficult urination  [] Frequent urination   [] Blood in urine Skin:  [] Rashes   [] Ulcers  Psychological:  [] History of anxiety   []  History of major depression.  Physical Examination  Vitals:   01/11/24 1232  BP: 111/62  Pulse: 90  Resp: (!) 22  Temp: 98.1 F (36.7 C)  SpO2: 97%  Weight: 73 kg  Height: 6' (1.829 m)   Body mass index is 21.84 kg/m. Gen: WD/WN, NAD Head: [place]/AT, No temporalis wasting.  Ear/Nose/Throat: Hearing grossly intact, nares w/o erythema or drainage Eyes: PER, EOMI, sclera nonicteric.  Neck: Supple, no gross masses or lesions.  No JVD.  Pulmonary:  Good air movement, no audible wheezing, no use of accessory muscles.  Cardiac: RRR, precordium non-hyperdynamic. Vascular:    Left brachiobasilic fistula weak thrill poor bruit, skin appears healthy and intact  Vessel Right Left  Radial Palpable Palpable  Brachial Palpable Palpable  Gastrointestinal: soft, non-distended. No guarding/no peritoneal signs.  Musculoskeletal: M/S 5/5 throughout.  No deformity.  Neurologic: CN 2-12 intact. Pain and light touch intact in extremities.  Symmetrical.  Speech is fluent. Motor exam as listed above. Psychiatric: Judgment intact, Mood & affect appropriate for pt's clinical situation. Dermatologic: No rashes or ulcers noted.  No changes consistent with  cellulitis.   CBC Lab Results  Component Value Date   WBC 4.6 11/02/2023   HGB 11.2 (L) 11/02/2023   HCT 32.6 (L) 11/02/2023   MCV 111 (H) 11/02/2023   PLT 109 (L) 11/02/2023    BMET    Component Value Date/Time   NA 142 11/02/2023 1003   K 4.2 11/02/2023 1003   CL 94 (L) 11/02/2023 1003   CO2 28 11/02/2023 1003   GLUCOSE 94 11/02/2023 1003   GLUCOSE 109 (H) 02/16/2020 1434   BUN 35 (H) 11/02/2023 1003   CREATININE 5.98 (H) 11/02/2023 1003   CALCIUM 10.4 (H) 11/02/2023 1003   GFRNONAA 8 (L) 02/16/2020 1434   GFRAA 9 (L) 02/16/2020 1434   CrCl cannot be calculated (Patient's  most recent lab result is older than the maximum 21 days allowed.).  COAG Lab Results  Component Value Date   INR 1.1 05/12/2021   INR 1.2 04/16/2021   INR 2.6 (H) 04/11/2021    Radiology No results found.   Assessment/Plan 1. End stage renal disease (HCC) Recommend:  The patient is experiencing increasing problems with their dialysis access.  Patient should have a fistulagram with the intention for intervention.  The intention for intervention is to restore appropriate flow and prevent thrombosis and possible loss of the access.  As well as improve the quality of dialysis therapy.  The risks, benefits and alternative therapies were reviewed in detail with the patient.  All questions were answered.  The patient agrees to proceed with angio/intervention.    The patient will follow up with me in the office after the procedure.    2. Chronic atrial fibrillation (HCC) Continue antiarrhythmia medications as already ordered, these medications have been reviewed and there are no changes at this time.   Continue anticoagulation as ordered by Cardiology Service   3. Essential hypertension Continue antihypertensive medications as already ordered, these medications have been reviewed and there are no changes at this time.   4. Gastroesophageal reflux disease, unspecified whether esophagitis  present Continue PPI as already ordered, this medication has been reviewed and there are no changes at this time.   Avoidence of caffeine and alcohol   Moderate elevation of the head of the bed    5. Type 2 diabetes mellitus with diabetic nephropathy, without long-term current use of insulin  (HCC) Continue hypoglycemic medications as already ordered, these medications have been reviewed and there are no changes at this time.   Hgb A1C to be monitored as already arranged by primary service   Cordella Shawl, MD  01/11/2024 1:37 PM

## 2024-01-12 ENCOUNTER — Encounter: Payer: Self-pay | Admitting: Vascular Surgery

## 2024-01-12 DIAGNOSIS — N2581 Secondary hyperparathyroidism of renal origin: Secondary | ICD-10-CM | POA: Diagnosis not present

## 2024-01-12 DIAGNOSIS — D631 Anemia in chronic kidney disease: Secondary | ICD-10-CM | POA: Diagnosis not present

## 2024-01-12 DIAGNOSIS — N186 End stage renal disease: Secondary | ICD-10-CM | POA: Diagnosis not present

## 2024-01-12 DIAGNOSIS — Z992 Dependence on renal dialysis: Secondary | ICD-10-CM | POA: Diagnosis not present

## 2024-01-14 DIAGNOSIS — Z992 Dependence on renal dialysis: Secondary | ICD-10-CM | POA: Diagnosis not present

## 2024-01-14 DIAGNOSIS — D631 Anemia in chronic kidney disease: Secondary | ICD-10-CM | POA: Diagnosis not present

## 2024-01-14 DIAGNOSIS — N2581 Secondary hyperparathyroidism of renal origin: Secondary | ICD-10-CM | POA: Diagnosis not present

## 2024-01-14 DIAGNOSIS — N186 End stage renal disease: Secondary | ICD-10-CM | POA: Diagnosis not present

## 2024-01-17 DIAGNOSIS — Q612 Polycystic kidney, adult type: Secondary | ICD-10-CM | POA: Diagnosis not present

## 2024-01-17 DIAGNOSIS — D631 Anemia in chronic kidney disease: Secondary | ICD-10-CM | POA: Diagnosis not present

## 2024-01-17 DIAGNOSIS — Z992 Dependence on renal dialysis: Secondary | ICD-10-CM | POA: Diagnosis not present

## 2024-01-17 DIAGNOSIS — N2581 Secondary hyperparathyroidism of renal origin: Secondary | ICD-10-CM | POA: Diagnosis not present

## 2024-01-17 DIAGNOSIS — N186 End stage renal disease: Secondary | ICD-10-CM | POA: Diagnosis not present

## 2024-01-19 DIAGNOSIS — N2581 Secondary hyperparathyroidism of renal origin: Secondary | ICD-10-CM | POA: Diagnosis not present

## 2024-01-19 DIAGNOSIS — Z992 Dependence on renal dialysis: Secondary | ICD-10-CM | POA: Diagnosis not present

## 2024-01-19 DIAGNOSIS — N186 End stage renal disease: Secondary | ICD-10-CM | POA: Diagnosis not present

## 2024-01-19 DIAGNOSIS — D631 Anemia in chronic kidney disease: Secondary | ICD-10-CM | POA: Diagnosis not present

## 2024-01-20 DIAGNOSIS — I482 Chronic atrial fibrillation, unspecified: Secondary | ICD-10-CM | POA: Diagnosis not present

## 2024-01-21 DIAGNOSIS — N186 End stage renal disease: Secondary | ICD-10-CM | POA: Diagnosis not present

## 2024-01-21 DIAGNOSIS — D631 Anemia in chronic kidney disease: Secondary | ICD-10-CM | POA: Diagnosis not present

## 2024-01-21 DIAGNOSIS — Z992 Dependence on renal dialysis: Secondary | ICD-10-CM | POA: Diagnosis not present

## 2024-01-21 DIAGNOSIS — N2581 Secondary hyperparathyroidism of renal origin: Secondary | ICD-10-CM | POA: Diagnosis not present

## 2024-01-24 DIAGNOSIS — Z992 Dependence on renal dialysis: Secondary | ICD-10-CM | POA: Diagnosis not present

## 2024-01-24 DIAGNOSIS — D631 Anemia in chronic kidney disease: Secondary | ICD-10-CM | POA: Diagnosis not present

## 2024-01-24 DIAGNOSIS — N186 End stage renal disease: Secondary | ICD-10-CM | POA: Diagnosis not present

## 2024-01-24 DIAGNOSIS — N2581 Secondary hyperparathyroidism of renal origin: Secondary | ICD-10-CM | POA: Diagnosis not present

## 2024-01-26 DIAGNOSIS — D631 Anemia in chronic kidney disease: Secondary | ICD-10-CM | POA: Diagnosis not present

## 2024-01-26 DIAGNOSIS — N2581 Secondary hyperparathyroidism of renal origin: Secondary | ICD-10-CM | POA: Diagnosis not present

## 2024-01-26 DIAGNOSIS — N186 End stage renal disease: Secondary | ICD-10-CM | POA: Diagnosis not present

## 2024-01-26 DIAGNOSIS — Z992 Dependence on renal dialysis: Secondary | ICD-10-CM | POA: Diagnosis not present

## 2024-01-27 NOTE — Progress Notes (Signed)
 [facility] - Clinic Note  02/01/2024     CHIEF COMPLAINT Patient presents for Retina Follow Up    HISTORY OF PRESENT ILLNESS: Mark Bauer is a 68 y.o. male who presents to the clinic today for:   HPI     Retina Follow Up   Patient presents with  Other.  In left eye.  This started 1 year ago.  I, the attending physician,  performed the HPI with the patient and updated documentation appropriately.        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 Patient states vision doing pretty good. No eye pain.       Last edited by Valdemar Rogue, MD on 02/04/2024  1:02 PM.       Referring physician: Estelle Fallow OD [address] [place], [place] [address]  HISTORICAL INFORMATION:   Selected notes from the MEDICAL RECORD NUMBER Referred by Dr. Fallow Estelle for diabetic retinal eval LEE: 10/21/2020 BCVA OD: 20/25-2 OS: 20/25-2 Ocular Hx- ?DR PMH- DM, HTN, gout, last a1c was 5.7 in February 2022    CURRENT MEDICATIONS: No current outpatient medications on file. (Ophthalmic Drugs)   No current facility-administered medications for this visit. (Ophthalmic Drugs)   Current Outpatient Medications (Other)  Medication Sig   allopurinol  (ZYLOPRIM ) 100 MG tablet Take 1 tablet (100 mg total) by mouth daily.   apixaban  (ELIQUIS ) 2.5 MG TABS tablet Take 1 tablet (2.5 mg total) by mouth 2 (two) times daily.   blood glucose meter kit and supplies Dispense based on patient and insurance preference. Use up to four times daily as directed. (FOR ICD-10 E11.9).   cinacalcet (SENSIPAR) 30 MG tablet Take 30 mg by mouth once a week. 3 x week   Dulaglutide  (TRULICITY ) 0.75 MG/0.5ML SOAJ INJECT 0.75 MG SUBCUTANEOUSLY ONE TIME PER WEEK   furosemide  (LASIX ) 20 MG tablet Take 1 tablet (20 mg total) by mouth 2 (two) times daily.   IRON SUCROSE  IV Inject 50 mg into the vein. Weekly   Lancets (ONETOUCH DELICA PLUS LANCET30G) MISC USE AS  DIRECTED EVERY DAY   loratadine (CLARITIN) 10 MG tablet Take 10 mg by mouth at bedtime. otc   lovastatin  (MEVACOR ) 20 MG tablet Take 1 tablet (20 mg total) by mouth daily.   Methoxy PEG-Epoetin  Beta (MIRCERA) 30 MCG/0.3ML SOSY Inject 30 mcg as directed every 30 (thirty) days.   ONETOUCH ULTRA test strip ONE TEST DAILY   pantoprazole  (PROTONIX ) 40 MG tablet Take 1 tablet (40 mg total) by mouth daily.   sevelamer carbonate (RENVELA) 800 MG tablet Take 800 mg by mouth 3 (three) times daily with meals.   spironolactone (ALDACTONE) 25 MG tablet Take 25 mg by mouth daily.   amoxicillin-clavulanate (AUGMENTIN) 500-125 MG tablet Take 500 mg by mouth daily. (Patient not taking: Reported on 11/02/2023)   calcitRIOL (ROCALTROL) 0.5 MCG capsule Take 0.5 mcg by mouth once a week. 3x week   cyanocobalamin  1000 MCG tablet Take 1,000 mcg by mouth daily. (Patient not taking: Reported on 02/01/2024)   No current facility-administered medications for this visit. (Other)   REVIEW OF SYSTEMS: ROS   Positive for: Gastrointestinal, Genitourinary, Endocrine, Cardiovascular, Eyes Negative for: Constitutional, Neurological, Skin, Musculoskeletal, HENT, Respiratory, Psychiatric, Allergic/Imm, Heme/Lymph Last edited by Orval Asberry RAMAN, COA on 02/01/2024  9:23 AM.       ALLERGIES Allergies  Allergen Reactions   Ciprofloxacin Other (See Comments)    hallucination  Other Reaction(s): Unknown  Other Reaction(s): Hallucination  hallucination  Other Reaction(s): Unknown   Pollen Extract Other (See Comments)    Sneezing, watery eyes, etc.  Other Reaction(s): Unknown   Caffeine Palpitations    palpatations   PAST MEDICAL HISTORY Past Medical History:  Diagnosis Date   Allergy    Bilateral undescended testicles    Cancer (HCC)    melanoma on forehead   Cataract    OU   Diabetes mellitus without complication (HCC)    Dysrhythmia    a fib   GERD (gastroesophageal reflux disease)    Gout    Hepatitis     polycystic    Hypertension    Hypokalemia    Mixed hyperlipidemia    Polycystic kidney    Polycystic liver disease    Vitamin D  deficiency    Past Surgical History:  Procedure Laterality Date   A/V FISTULAGRAM Left 12/15/2022   Procedure: A/V Fistulagram;  Surgeon: Jama Cordella MATSU, MD;  Location: [facility] INVASIVE CV [facility];  Service: Cardiovascular;  Laterality: Left;   A/V FISTULAGRAM Left 01/11/2024   Procedure: A/V Fistulagram;  Surgeon: Jama Cordella MATSU, MD;  Location: [facility] INVAS[facility] CV [facility];  Service: Cardiovascular;  Laterality: Left;   BASCILIC VEIN TRANSPOSITION Left 02/23/2020   Procedure: BASCILIC VEIN TRANSPOSITION (SINGLE STAGE);  Surgeon: Jama Cordella MATSU, MD;  Location: [facility] ORS;  Service: Vascular;  Laterality: Left;   COLONOSCOPY  2012   hemorrhoids   melanoma removal     forehead   FAMILY HISTORY Family History  Problem Relation Age of Onset   Diabetes Mother    Heart disease Father    SOCIAL HISTORY Social History   Tobacco Use   Smoking status: Never   Smokeless tobacco: Never  Vaping Use   Vaping status: Never Used  Substance Use Topics   Alcohol use: No   Drug use: No       OPHTHALMIC EXAM:  Base Eye Exam     Visual Acuity (Snellen - Linear)       Right Left   Dist cc 20/20 -1 20/25 -2    Correction: Glasses         Tonometry (Tonopen, 9:21 AM)       Right Left   Pressure 13 12         Pupils       Dark Light Shape React APD   Right 3 2 Round Brisk None   Left 3 2 Round Brisk None         Visual Fields (Counting fingers)       Left Right    Full Full         Extraocular Movement       Right Left    Full, Ortho Full, Ortho         Neuro/Psych     Oriented x3: Yes   Mood/Affect: Normal         Dilation     Both eyes: 1.0% Mydriacyl, 2.5% Phenylephrine  @ 9:21 AM           Slit Lamp and Fundus Exam     Slit Lamp Exam       Right Left   Lids/Lashes Dermatochalasis - upper lid, Meibomian gland  dysfunction Dermatochalasis - upper lid, mild MGD   Conjunctiva/Sclera White and quiet White and quiet   Cornea mild tear film debris mild tear film debris, trace PEE   Anterior Chamber Deep and clear Deep and clear   Iris Round and dilated,  No NVI Round and dilated, No NVI   Lens 2-3+ Nuclear sclerosis, 2-3+ Cortical cataract 2-3+ Nuclear sclerosis with brunescence, 2-3+ Cortical cataract   Anterior Vitreous Mild vitreous syneresis, Posterior vitreous detachment Mil vitreous syneresis         Fundus Exam       Right Left   Disc mild Pallor, Sharp rim, Compact mild Pallor, Sharp rim, mild PPP   C/D Ratio 0.2 0.4   Macula Flat, Blunted foveal reflex, RPE mottling and clumping Flat, Blunted foveal reflex, mild RPE mottling and clumping, No heme or edema, fine drusen   Vessels mild attenuation, mild tortuosity attenuated, mild tortuosity   Periphery Attached, blonde fundus, prominent vortex veins, pigmented cystoid degeneration inferiorly, trace reticular degeneration, scattered DBH Attached, shallow peripheral schisis -- stable, pigmented cystoid degeneration inferiorly, No RT/RD, mild reticular degeneration, scattered DBH           Refraction     Wearing Rx       Sphere Cylinder Axis Add   Right -5.50 +1.25 064 +2.25   Left -3.50 +2.00 108 +2.25    Type: PAL           IMAGING AND PROCEDURES  Imaging and Procedures for 02/01/2024  OCT, Retina - OU - Both Eyes       Right Eye Quality was good. Central Foveal Thickness: 244. Progression has been stable. Findings include normal foveal contour, no IRF, no SRF (Foveal notch, focal ellipsoid irregularity and inner retinal hyper reflectivity along IT arcades -- stable).   Left Eye Quality was good. Central Foveal Thickness: 253. Progression has been stable. Findings include normal foveal contour, no IRF, no SRF, vitreomacular adhesion (Mild multilaminar schisis 360 periphery, sparing macula-- stable, interval release of VMA to  partial PVD).   Notes *Images captured and stored on drive  Diagnosis / Impression:  NFP; no IRF/SRF OU OD: Foveal notch; focal ellipsoid irregularity and inner retinal hyper reflectivity along IT arcades --stable OS: Mild multilaminar schisis 360 periphery, sparing macula -- stable  Clinical management:  See below  Abbreviations: NFP - Normal foveal profile. CME - cystoid macular edema. PED - pigment epithelial detachment. IRF - intraretinal fluid. SRF - subretinal fluid. EZ - ellipsoid zone. ERM - epiretinal membrane. ORA - outer retinal atrophy. ORT - outer retinal tubulation. SRHM - subretinal hyper-reflective material. IRHM - intraretinal hyper-reflective material             ASSESSMENT/PLAN:    ICD-10-CM   1. 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 Subretinal hemorrhage of right eye  H35.61     3. Diabetes mellitus type 2 without retinopathy (HCC)  E11.9     4. Long-term current use of injectable noninsulin antidiabetic medication  Z79.85     5. Essential hypertension  I10     6. Hypertensive retinopathy of both eyes  H35.033     7. Combined forms of age-related cataract of both eyes  H25.813       1. Retinoschisis OS -- stable - peripheral multilaminar schisis, sparing macula, stable - identified on widefield OCT 07.11.23  - OCT shows shallow, multilaminar schisis in periphery only -- stable from prior  - no RT/RD on exam  - pt asymptomatic  - s/s of RD reviewed  - f/u 1 year, DFE, OCT  2. H/o Subretinal hemorrhage OD -- stably resolved  - incidental finding on routine eye exam by Dr. Estelle  - pt asymptomatic  -  focal subretinal heme along proximal IT arteriole OD - OCT showed mild inner retinal hyperreflectivity and focal ellipsoid irregularity at focal heme site -- improved  - FA 4.13.22 without leakage, but +blockage at heme site -- not active - pt with history of Afib, was on coumadin , now on eliquis  -- history of  difficulty controlling INR and easy bruising  - suspect focal subretinal heme is related to history of coumadin  use and HTN  - not visually significant and not threatening vision  - discussed findings, prognosis  - no intervention recommended at this time  - monitor  - f/u in 1 year, sooner prn, DFE, OCT  3,4. Diabetes mellitus, type 2 without retinopathy  - A1c: 4.9 on 04.15.25 - The incidence, risk factors for progression, natural history and treatment options for diabetic retinopathy  were discussed with patient.   - The need for close monitoring of blood glucose, blood pressure, and serum lipids, avoiding cigarette or any type of tobacco, and the need for long term follow up was also discussed with patient. - f/u in 1 year, sooner prn  5,6. Hypertensive retinopathy OU - discussed importance of tight BP control - monitor  7. Mixed Cataract OU - The symptoms of cataract, surgical options, and treatments and risks were discussed with patient. - discussed diagnosis and progression - monitor  Ophthalmic Meds Ordered this visit:  No orders of the defined types were placed in this encounter.    Return in about 1 year (around 01/31/2025) for f/u schisis OU, DFE, OCT.  There are no Patient Instructions on file for this visit.   This document serves as a record of services personally performed by Redell JUDITHANN Hans, MD, PhD. It was created on their behalf by Auston Muzzy, COMT. The creation of this record is the provider's dictation and/or activities during the visit.  Electronically signed by: Auston Muzzy, COMT 02/04/24 1:05 PM  This document serves as a record of services personally performed by Redell JUDITHANN Hans, MD, PhD. It was created on their behalf by Alan PARAS. Delores, OA an ophthalmic technician. The creation of this record is the provider's dictation and/or activities during the visit.    Electronically signed by: Alan PARAS. Delores, OA 02/04/24 1:05 PM  Redell JUDITHANN Hans, M.D.,  Ph.D. Diseases & Surgery of the Retina and Vitreous [facility]  I have reviewed the above documentation for accuracy and completeness, and I agree with the above. Redell JUDITHANN Hans, M.D., Ph.D. 02/04/24 1:05 PM   Abbreviations: M myopia (nearsighted); A astigmatism; H hyperopia (farsighted); P presbyopia; Mrx spectacle prescription;  CTL contact lenses; OD right eye; OS left eye; OU both eyes  XT exotropia; ET esotropia; PEK punctate epithelial keratitis; PEE punctate epithelial erosions; DES dry eye syndrome; MGD meibomian gland dysfunction; ATs artificial tears; PFAT's preservative free artificial tears; NSC nuclear sclerotic cataract; PSC posterior subcapsular cataract; ERM epi-retinal membrane; PVD posterior vitreous detachment; RD retinal detachment; DM diabetes mellitus; DR diabetic retinopathy; NPDR non-proliferative diabetic retinopathy; PDR proliferative diabetic retinopathy; CSME clinically significant macular edema; DME diabetic macular edema; dbh dot blot hemorrhages; CWS cotton wool spot; POAG primary open angle glaucoma; C/D cup-to-disc ratio; HVF humphrey visual field; GVF goldmann visual field; OCT optical coherence tomography; IOP intraocular pressure; BRVO Branch retinal vein occlusion; CRVO central retinal vein occlusion; CRAO central retinal artery occlusion; BRAO branch retinal artery occlusion; RT retinal tear; SB scleral buckle; PPV pars plana vitrectomy; VH Vitreous hemorrhage; PRP panretinal laser photocoagulation; IVK intravitreal kenalog ;  VMT vitreomacular traction; MH Macular hole;  NVD neovascularization of the disc; NVE neovascularization elsewhere; AREDS age related eye disease study; ARMD age related macular degeneration; POAG primary open angle glaucoma; EBMD epithelial/anterior basement membrane dystrophy; ACIOL anterior chamber intraocular lens; IOL intraocular lens; PCIOL posterior chamber intraocular lens; Phaco/IOL phacoemulsification with  intraocular lens placement; PRK photorefractive keratectomy; LASIK laser assisted in situ keratomileusis; HTN hypertension; DM diabetes mellitus; COPD chronic obstructive pulmonary disease

## 2024-01-28 DIAGNOSIS — D631 Anemia in chronic kidney disease: Secondary | ICD-10-CM | POA: Diagnosis not present

## 2024-01-28 DIAGNOSIS — N186 End stage renal disease: Secondary | ICD-10-CM | POA: Diagnosis not present

## 2024-01-28 DIAGNOSIS — Z992 Dependence on renal dialysis: Secondary | ICD-10-CM | POA: Diagnosis not present

## 2024-01-28 DIAGNOSIS — N2581 Secondary hyperparathyroidism of renal origin: Secondary | ICD-10-CM | POA: Diagnosis not present

## 2024-01-31 DIAGNOSIS — N186 End stage renal disease: Secondary | ICD-10-CM | POA: Diagnosis not present

## 2024-01-31 DIAGNOSIS — N2581 Secondary hyperparathyroidism of renal origin: Secondary | ICD-10-CM | POA: Diagnosis not present

## 2024-01-31 DIAGNOSIS — Z992 Dependence on renal dialysis: Secondary | ICD-10-CM | POA: Diagnosis not present

## 2024-01-31 DIAGNOSIS — D631 Anemia in chronic kidney disease: Secondary | ICD-10-CM | POA: Diagnosis not present

## 2024-02-01 ENCOUNTER — Ambulatory Visit (INDEPENDENT_AMBULATORY_CARE_PROVIDER_SITE_OTHER): Payer: Medicare Other | Admitting: Ophthalmology

## 2024-02-01 ENCOUNTER — Encounter (INDEPENDENT_AMBULATORY_CARE_PROVIDER_SITE_OTHER): Payer: Self-pay | Admitting: Ophthalmology

## 2024-02-01 DIAGNOSIS — Z7985 Long-term (current) use of injectable non-insulin antidiabetic drugs: Secondary | ICD-10-CM

## 2024-02-01 DIAGNOSIS — H25813 Combined forms of age-related cataract, bilateral: Secondary | ICD-10-CM

## 2024-02-01 DIAGNOSIS — H3561 Retinal hemorrhage, right eye: Secondary | ICD-10-CM | POA: Diagnosis not present

## 2024-02-01 DIAGNOSIS — H33102 Unspecified retinoschisis, left eye: Secondary | ICD-10-CM

## 2024-02-01 DIAGNOSIS — I10 Essential (primary) hypertension: Secondary | ICD-10-CM | POA: Diagnosis not present

## 2024-02-01 DIAGNOSIS — E119 Type 2 diabetes mellitus without complications: Secondary | ICD-10-CM | POA: Diagnosis not present

## 2024-02-01 DIAGNOSIS — H35033 Hypertensive retinopathy, bilateral: Secondary | ICD-10-CM | POA: Diagnosis not present

## 2024-02-02 ENCOUNTER — Ambulatory Visit: Payer: Medicare Other

## 2024-02-02 DIAGNOSIS — N186 End stage renal disease: Secondary | ICD-10-CM | POA: Diagnosis not present

## 2024-02-02 DIAGNOSIS — N2581 Secondary hyperparathyroidism of renal origin: Secondary | ICD-10-CM | POA: Diagnosis not present

## 2024-02-02 DIAGNOSIS — Z992 Dependence on renal dialysis: Secondary | ICD-10-CM | POA: Diagnosis not present

## 2024-02-02 DIAGNOSIS — D631 Anemia in chronic kidney disease: Secondary | ICD-10-CM | POA: Diagnosis not present

## 2024-02-04 ENCOUNTER — Encounter (INDEPENDENT_AMBULATORY_CARE_PROVIDER_SITE_OTHER): Payer: Self-pay | Admitting: Ophthalmology

## 2024-02-04 DIAGNOSIS — N2581 Secondary hyperparathyroidism of renal origin: Secondary | ICD-10-CM | POA: Diagnosis not present

## 2024-02-04 DIAGNOSIS — N186 End stage renal disease: Secondary | ICD-10-CM | POA: Diagnosis not present

## 2024-02-04 DIAGNOSIS — Z992 Dependence on renal dialysis: Secondary | ICD-10-CM | POA: Diagnosis not present

## 2024-02-04 DIAGNOSIS — D631 Anemia in chronic kidney disease: Secondary | ICD-10-CM | POA: Diagnosis not present

## 2024-02-07 DIAGNOSIS — N186 End stage renal disease: Secondary | ICD-10-CM | POA: Diagnosis not present

## 2024-02-07 DIAGNOSIS — D631 Anemia in chronic kidney disease: Secondary | ICD-10-CM | POA: Diagnosis not present

## 2024-02-07 DIAGNOSIS — N2581 Secondary hyperparathyroidism of renal origin: Secondary | ICD-10-CM | POA: Diagnosis not present

## 2024-02-07 DIAGNOSIS — Z992 Dependence on renal dialysis: Secondary | ICD-10-CM | POA: Diagnosis not present

## 2024-02-09 DIAGNOSIS — D631 Anemia in chronic kidney disease: Secondary | ICD-10-CM | POA: Diagnosis not present

## 2024-02-09 DIAGNOSIS — N2581 Secondary hyperparathyroidism of renal origin: Secondary | ICD-10-CM | POA: Diagnosis not present

## 2024-02-09 DIAGNOSIS — N186 End stage renal disease: Secondary | ICD-10-CM | POA: Diagnosis not present

## 2024-02-09 DIAGNOSIS — Z992 Dependence on renal dialysis: Secondary | ICD-10-CM | POA: Diagnosis not present

## 2024-02-10 ENCOUNTER — Ambulatory Visit (INDEPENDENT_AMBULATORY_CARE_PROVIDER_SITE_OTHER)

## 2024-02-10 ENCOUNTER — Ambulatory Visit (INDEPENDENT_AMBULATORY_CARE_PROVIDER_SITE_OTHER): Admitting: Nurse Practitioner

## 2024-02-10 ENCOUNTER — Encounter (INDEPENDENT_AMBULATORY_CARE_PROVIDER_SITE_OTHER): Payer: Self-pay | Admitting: Nurse Practitioner

## 2024-02-10 VITALS — BP 94/56 | HR 102 | Resp 18 | Ht 72.0 in | Wt 161.0 lb

## 2024-02-10 DIAGNOSIS — N186 End stage renal disease: Secondary | ICD-10-CM

## 2024-02-10 DIAGNOSIS — E782 Mixed hyperlipidemia: Secondary | ICD-10-CM | POA: Diagnosis not present

## 2024-02-10 DIAGNOSIS — I10 Essential (primary) hypertension: Secondary | ICD-10-CM

## 2024-02-10 NOTE — Progress Notes (Signed)
 Subjective:    Patient ID: Mark Bauer, male    DOB: 1956-05-25, 68 y.o.   MRN: [id number] Chief Complaint  Patient presents with   Follow-up    [facility] 3 week with HDA    The patient returns to the office for followup status post intervention of their left basilic vein transposition on 01/11/2024.  Following the intervention the access function has significantly improved, with better flow rates and improved KT/V. The patient has not been experiencing increased bleeding times following decannulation and the patient denies increased recirculation. The patient denies an increase in arm swelling. At the present time the patient denies hand pain.  No recent shortening of the patient's walking distance or new symptoms consistent with claudication.  No history of rest pain symptoms. No new ulcers or wounds of the lower extremities have occurred.  The patient denies amaurosis fugax or recent TIA symptoms. There are no recent neurological changes noted. There is no history of DVT, PE or superficial thrombophlebitis. No recent episodes of angina or shortness of breath documented.   Duplex ultrasound of the AV access shows a patent access.  The previously noted stenosis is improved compared to last study.  Flow volume today is 2608 cc/min (previous flow volume was 2298 cc/min)       Review of Systems  Cardiovascular:  Negative for leg swelling.  Hematological:  Bruises/bleeds easily.  All other systems reviewed and are negative.      Objective:   Physical Exam Vitals reviewed.  HENT:     Head: Normocephalic.  Cardiovascular:     Rate and Rhythm: Normal rate.     Pulses: Normal pulses.  Pulmonary:     Effort: Pulmonary effort is normal.  Musculoskeletal:     Right lower leg: No edema.     Left lower leg: No edema.  Skin:    General: Skin is warm and dry.  Neurological:     Mental Status: He is alert and oriented to person, place, and time.  Psychiatric:        Mood and  Affect: Mood normal.        Behavior: Behavior normal.        Thought Content: Thought content normal.        Judgment: Judgment normal.     BP (!) 94/56   Pulse (!) 102   Resp 18   Ht 6' (1.829 m)   Wt 161 lb (73 kg)   BMI 21.84 kg/m   Past Medical History:  Diagnosis Date   Allergy    Bilateral undescended testicles    Cancer (HCC)    melanoma on forehead   Cataract    OU   Diabetes mellitus without complication (HCC)    Dysrhythmia    a fib   GERD (gastroesophageal reflux disease)    Gout    Hepatitis    polycystic    Hypertension    Hypokalemia    Mixed hyperlipidemia    Polycystic kidney    Polycystic liver disease    Vitamin D  deficiency     Social History   Socioeconomic History   Marital status: Single    Spouse name: Not on file   Number of children: 0   Years of education: Not on file   Highest education level: Associate degree: occupational, Scientist, product/process development, or vocational program  Occupational History   Occupation: retired  Tobacco Use   Smoking status: Never   Smokeless tobacco: Never  Building services engineer  status: Never Used  Substance and Sexual Activity   Alcohol use: No   Drug use: No   Sexual activity: Never  Other Topics Concern   Not on file  Social History Narrative   Never married, no children, lives alone.   Social Drivers of Corporate investment banker Strain: Low Risk  (01/01/2024)   Received from [facility]   Overall Financial Resource Strain (CARDIA)    Difficulty of Paying Living Expenses: Not hard at all  Food Insecurity: No Food Insecurity (01/01/2024)   Received from [facility]   Hunger Vital Sign    Within the past 12 months, you worried that your food would run out before you got the money to buy more.: Never true    Within the past 12 months, the food you bought just didn't last and you didn't have money to get more.: Never true  Transportation Needs: No Transportation Needs  (01/01/2024)   Received from [facility] - Transportation    In the past 12 months, has lack of transportation kept you from medical appointments or from getting medications?: No    Lack of Transportation (Non-Medical): No  Physical Activity: Insufficiently Active (07/13/2023)   Exercise Vital Sign    Days of Exercise per Week: 2 days    Minutes of Exercise per Session: 20 min  Stress: No Stress Concern Present (07/13/2023)   [employer] of Occupational Health - Occupational Stress Questionnaire    Feeling of Stress : Not at all  Social Connections: Unknown (07/13/2023)   Social Connection and Isolation Panel    Frequency of Communication with Friends and Family: Once a week    Frequency of Social Gatherings with Friends and Family: Patient declined    Attends Religious Services: Never    Database administrator or Organizations: No    Attends Banker Meetings: Never    Marital Status: Never married  Intimate Partner Violence: Not At Risk (01/28/2023)   Humiliation, Afraid, Rape, and Kick questionnaire    Fear of Current or Ex-Partner: No    Emotionally Abused: No    Physically Abused: No    Sexually Abused: No    Past Surgical History:  Procedure Laterality Date   A/V FISTULAGRAM Left 12/15/2022   Procedure: A/V Fistulagram;  Surgeon: Jama Cordella MATSU, MD;  Location: [facility] INVASIVE CV LAB;  Service: Cardiovascular;  Laterality: Left;   A/V FISTULAGRAM Left 01/11/2024   Procedure: A/V Fistulagram;  Surgeon: Jama Cordella MATSU, MD;  Location: [facility];  Service: Cardiovascular;  Laterality: Left;   BASCILIC VEIN TRANSPOSITION Left 02/23/2020   Procedure: BASCILIC VEIN TRANSPOSITION (SINGLE STAGE);  Surgeon: Jama Cordella MATSU, MD;  Location: [facility] ORS;  Service: Vascular;  Laterality: Left;   COLONOSCOPY  2012   hemorrhoids   melanoma removal     forehead    Family History  Problem Relation Age of Onset   Diabetes Mother     Heart disease Father     Allergies  Allergen Reactions   Ciprofloxacin Other (See Comments)    hallucination  Other Reaction(s): Unknown  Other Reaction(s): Hallucination  hallucination  Other Reaction(s): Unknown   Pollen Extract Other (See Comments)    Sneezing, watery eyes, etc.  Other Reaction(s): Unknown   Caffeine Palpitations    palpatations       Latest Ref Rng & Units 11/02/2023   10:03 AM 09/12/2021   10:36 AM 02/16/2020  2:34 PM  CBC  WBC 3.4 - 10.8 x10E3/uL 4.6  5.9  6.2   Hemoglobin 13.0 - 17.7 g/dL 88.7  89.9  87.3   Hematocrit 37.5 - 51.0 % 32.6  30.8  37.1   Platelets 150 - 450 x10E3/uL 109  119  157       CMP     Component Value Date/Time   NA 142 11/02/2023 1003   K 4.2 11/02/2023 1003   CL 94 (L) 11/02/2023 1003   CO2 28 11/02/2023 1003   GLUCOSE 94 11/02/2023 1003   GLUCOSE 109 (H) 02/16/2020 1434   BUN 35 (H) 11/02/2023 1003   CREATININE 5.98 (H) 11/02/2023 1003   CALCIUM 10.4 (H) 11/02/2023 1003   PROT 6.2 11/02/2023 1003   ALBUMIN 4.4 11/02/2023 1003   AST 20 11/02/2023 1003   ALT 13 11/02/2023 1003   ALKPHOS 96 11/02/2023 1003   BILITOT 0.5 11/02/2023 1003   EGFR 10 (L) 11/02/2023 1003   GFRNONAA 8 (L) 02/16/2020 1434     No results found.     Assessment & Plan:   1. End stage renal disease (HCC) (Primary) Recommend:  The patient is doing well and currently has adequate dialysis access.  Although there are some parameters suggesting possible future issues.  He still some bleeding but it is much improved.  The patient's dialysis center is not reporting any major access issues.  This raises concerns that the access is at moderate but not high risk for a problem or thrombosis and should be followed more closely  The patient will follow-up with me in the office in 3 months.  The need for a follow up duplex ultrasound will be made at that time based on whether problems with the access are persistent.    2. Essential  hypertension Continue antihypertensive medications as already ordered, these medications have been reviewed and there are no changes at this time.  3. Mixed hyperlipidemia Continue statin as ordered and reviewed, no changes at this time   Current Outpatient Medications on File Prior to Visit  Medication Sig Dispense Refill   allopurinol  (ZYLOPRIM ) 100 MG tablet Take 1 tablet (100 mg total) by mouth daily. 90 tablet 1   apixaban  (ELIQUIS ) 2.5 MG TABS tablet Take 1 tablet (2.5 mg total) by mouth 2 (two) times daily. 60 tablet 6   blood glucose meter kit and supplies Dispense based on patient and insurance preference. Use up to four times daily as directed. (FOR ICD-10 E11.9). 1 each 0   cinacalcet (SENSIPAR) 30 MG tablet Take 30 mg by mouth once a week. 3 x week     Dulaglutide  (TRULICITY ) 0.75 MG/0.5ML SOAJ INJECT 0.75 MG SUBCUTANEOUSLY ONE TIME PER WEEK 6 mL 4   furosemide  (LASIX ) 20 MG tablet Take 1 tablet (20 mg total) by mouth 2 (two) times daily. 180 tablet 1   IRON SUCROSE  IV Inject 50 mg into the vein. Weekly     Lancets (ONETOUCH DELICA PLUS LANCET30G) MISC USE AS DIRECTED EVERY DAY     loratadine (CLARITIN) 10 MG tablet Take 10 mg by mouth at bedtime. otc     lovastatin  (MEVACOR ) 20 MG tablet Take 1 tablet (20 mg total) by mouth daily. 90 tablet 1   Methoxy PEG-Epoetin  Beta (MIRCERA) 30 MCG/0.3ML SOSY Inject 30 mcg as directed every 30 (thirty) days.     ONETOUCH ULTRA test strip ONE TEST DAILY 100 strip 0   pantoprazole  (PROTONIX ) 40 MG tablet Take 1 tablet (40 mg  total) by mouth daily. 90 tablet 1   sevelamer carbonate (RENVELA) 800 MG tablet Take 800 mg by mouth 3 (three) times daily with meals.     spironolactone (ALDACTONE) 25 MG tablet Take 25 mg by mouth daily.     amoxicillin-clavulanate (AUGMENTIN) 500-125 MG tablet Take 500 mg by mouth daily. (Patient not taking: Reported on 11/02/2023)     calcitRIOL (ROCALTROL) 0.5 MCG capsule Take 0.5 mcg by mouth once a week. 3x week      cyanocobalamin  1000 MCG tablet Take 1,000 mcg by mouth daily. (Patient not taking: Reported on 02/01/2024)     No current facility-administered medications on file prior to visit.    There are no Patient Instructions on file for this visit. No follow-ups on file.   Fayne Mcguffee E Nathaly Dawkins, NP

## 2024-02-11 DIAGNOSIS — N186 End stage renal disease: Secondary | ICD-10-CM | POA: Diagnosis not present

## 2024-02-11 DIAGNOSIS — N2581 Secondary hyperparathyroidism of renal origin: Secondary | ICD-10-CM | POA: Diagnosis not present

## 2024-02-11 DIAGNOSIS — Z992 Dependence on renal dialysis: Secondary | ICD-10-CM | POA: Diagnosis not present

## 2024-02-11 DIAGNOSIS — D631 Anemia in chronic kidney disease: Secondary | ICD-10-CM | POA: Diagnosis not present

## 2024-02-14 DIAGNOSIS — N2581 Secondary hyperparathyroidism of renal origin: Secondary | ICD-10-CM | POA: Diagnosis not present

## 2024-02-14 DIAGNOSIS — N186 End stage renal disease: Secondary | ICD-10-CM | POA: Diagnosis not present

## 2024-02-14 DIAGNOSIS — Z992 Dependence on renal dialysis: Secondary | ICD-10-CM | POA: Diagnosis not present

## 2024-02-14 DIAGNOSIS — D631 Anemia in chronic kidney disease: Secondary | ICD-10-CM | POA: Diagnosis not present

## 2024-02-16 DIAGNOSIS — N2581 Secondary hyperparathyroidism of renal origin: Secondary | ICD-10-CM | POA: Diagnosis not present

## 2024-02-16 DIAGNOSIS — N186 End stage renal disease: Secondary | ICD-10-CM | POA: Diagnosis not present

## 2024-02-16 DIAGNOSIS — Z992 Dependence on renal dialysis: Secondary | ICD-10-CM | POA: Diagnosis not present

## 2024-02-16 DIAGNOSIS — D631 Anemia in chronic kidney disease: Secondary | ICD-10-CM | POA: Diagnosis not present

## 2024-02-17 DIAGNOSIS — N186 End stage renal disease: Secondary | ICD-10-CM | POA: Diagnosis not present

## 2024-02-17 DIAGNOSIS — Q612 Polycystic kidney, adult type: Secondary | ICD-10-CM | POA: Diagnosis not present

## 2024-02-17 DIAGNOSIS — Z992 Dependence on renal dialysis: Secondary | ICD-10-CM | POA: Diagnosis not present

## 2024-02-18 DIAGNOSIS — Z992 Dependence on renal dialysis: Secondary | ICD-10-CM | POA: Diagnosis not present

## 2024-02-18 DIAGNOSIS — N2581 Secondary hyperparathyroidism of renal origin: Secondary | ICD-10-CM | POA: Diagnosis not present

## 2024-02-18 DIAGNOSIS — D631 Anemia in chronic kidney disease: Secondary | ICD-10-CM | POA: Diagnosis not present

## 2024-02-18 DIAGNOSIS — N186 End stage renal disease: Secondary | ICD-10-CM | POA: Diagnosis not present

## 2024-02-21 DIAGNOSIS — N2581 Secondary hyperparathyroidism of renal origin: Secondary | ICD-10-CM | POA: Diagnosis not present

## 2024-02-21 DIAGNOSIS — N186 End stage renal disease: Secondary | ICD-10-CM | POA: Diagnosis not present

## 2024-02-21 DIAGNOSIS — D631 Anemia in chronic kidney disease: Secondary | ICD-10-CM | POA: Diagnosis not present

## 2024-02-21 DIAGNOSIS — Z992 Dependence on renal dialysis: Secondary | ICD-10-CM | POA: Diagnosis not present

## 2024-02-23 DIAGNOSIS — Z992 Dependence on renal dialysis: Secondary | ICD-10-CM | POA: Diagnosis not present

## 2024-02-23 DIAGNOSIS — D631 Anemia in chronic kidney disease: Secondary | ICD-10-CM | POA: Diagnosis not present

## 2024-02-23 DIAGNOSIS — N2581 Secondary hyperparathyroidism of renal origin: Secondary | ICD-10-CM | POA: Diagnosis not present

## 2024-02-23 DIAGNOSIS — N186 End stage renal disease: Secondary | ICD-10-CM | POA: Diagnosis not present

## 2024-02-25 DIAGNOSIS — N186 End stage renal disease: Secondary | ICD-10-CM | POA: Diagnosis not present

## 2024-02-25 DIAGNOSIS — Z992 Dependence on renal dialysis: Secondary | ICD-10-CM | POA: Diagnosis not present

## 2024-02-25 DIAGNOSIS — N2581 Secondary hyperparathyroidism of renal origin: Secondary | ICD-10-CM | POA: Diagnosis not present

## 2024-02-25 DIAGNOSIS — D631 Anemia in chronic kidney disease: Secondary | ICD-10-CM | POA: Diagnosis not present

## 2024-02-28 DIAGNOSIS — N2581 Secondary hyperparathyroidism of renal origin: Secondary | ICD-10-CM | POA: Diagnosis not present

## 2024-02-28 DIAGNOSIS — D631 Anemia in chronic kidney disease: Secondary | ICD-10-CM | POA: Diagnosis not present

## 2024-02-28 DIAGNOSIS — N186 End stage renal disease: Secondary | ICD-10-CM | POA: Diagnosis not present

## 2024-02-28 DIAGNOSIS — Z992 Dependence on renal dialysis: Secondary | ICD-10-CM | POA: Diagnosis not present

## 2024-03-01 DIAGNOSIS — N186 End stage renal disease: Secondary | ICD-10-CM | POA: Diagnosis not present

## 2024-03-01 DIAGNOSIS — D631 Anemia in chronic kidney disease: Secondary | ICD-10-CM | POA: Diagnosis not present

## 2024-03-01 DIAGNOSIS — Z992 Dependence on renal dialysis: Secondary | ICD-10-CM | POA: Diagnosis not present

## 2024-03-01 DIAGNOSIS — N2581 Secondary hyperparathyroidism of renal origin: Secondary | ICD-10-CM | POA: Diagnosis not present

## 2024-03-03 DIAGNOSIS — N2581 Secondary hyperparathyroidism of renal origin: Secondary | ICD-10-CM | POA: Diagnosis not present

## 2024-03-03 DIAGNOSIS — N186 End stage renal disease: Secondary | ICD-10-CM | POA: Diagnosis not present

## 2024-03-03 DIAGNOSIS — D631 Anemia in chronic kidney disease: Secondary | ICD-10-CM | POA: Diagnosis not present

## 2024-03-03 DIAGNOSIS — Z992 Dependence on renal dialysis: Secondary | ICD-10-CM | POA: Diagnosis not present

## 2024-03-06 DIAGNOSIS — N186 End stage renal disease: Secondary | ICD-10-CM | POA: Diagnosis not present

## 2024-03-06 DIAGNOSIS — D631 Anemia in chronic kidney disease: Secondary | ICD-10-CM | POA: Diagnosis not present

## 2024-03-06 DIAGNOSIS — N2581 Secondary hyperparathyroidism of renal origin: Secondary | ICD-10-CM | POA: Diagnosis not present

## 2024-03-06 DIAGNOSIS — Z992 Dependence on renal dialysis: Secondary | ICD-10-CM | POA: Diagnosis not present

## 2024-03-08 DIAGNOSIS — Z992 Dependence on renal dialysis: Secondary | ICD-10-CM | POA: Diagnosis not present

## 2024-03-08 DIAGNOSIS — N2581 Secondary hyperparathyroidism of renal origin: Secondary | ICD-10-CM | POA: Diagnosis not present

## 2024-03-08 DIAGNOSIS — D631 Anemia in chronic kidney disease: Secondary | ICD-10-CM | POA: Diagnosis not present

## 2024-03-08 DIAGNOSIS — N186 End stage renal disease: Secondary | ICD-10-CM | POA: Diagnosis not present

## 2024-03-09 DIAGNOSIS — K219 Gastro-esophageal reflux disease without esophagitis: Secondary | ICD-10-CM | POA: Diagnosis not present

## 2024-03-09 DIAGNOSIS — I120 Hypertensive chronic kidney disease with stage 5 chronic kidney disease or end stage renal disease: Secondary | ICD-10-CM | POA: Diagnosis not present

## 2024-03-09 DIAGNOSIS — E782 Mixed hyperlipidemia: Secondary | ICD-10-CM | POA: Diagnosis not present

## 2024-03-09 DIAGNOSIS — R600 Localized edema: Secondary | ICD-10-CM | POA: Diagnosis not present

## 2024-03-09 DIAGNOSIS — Z992 Dependence on renal dialysis: Secondary | ICD-10-CM | POA: Diagnosis not present

## 2024-03-09 DIAGNOSIS — I10 Essential (primary) hypertension: Secondary | ICD-10-CM | POA: Diagnosis not present

## 2024-03-09 DIAGNOSIS — N186 End stage renal disease: Secondary | ICD-10-CM | POA: Diagnosis not present

## 2024-03-09 DIAGNOSIS — Z7901 Long term (current) use of anticoagulants: Secondary | ICD-10-CM | POA: Diagnosis not present

## 2024-03-09 DIAGNOSIS — E118 Type 2 diabetes mellitus with unspecified complications: Secondary | ICD-10-CM | POA: Diagnosis not present

## 2024-03-09 DIAGNOSIS — I482 Chronic atrial fibrillation, unspecified: Secondary | ICD-10-CM | POA: Diagnosis not present

## 2024-03-10 DIAGNOSIS — Z992 Dependence on renal dialysis: Secondary | ICD-10-CM | POA: Diagnosis not present

## 2024-03-10 DIAGNOSIS — D631 Anemia in chronic kidney disease: Secondary | ICD-10-CM | POA: Diagnosis not present

## 2024-03-10 DIAGNOSIS — N186 End stage renal disease: Secondary | ICD-10-CM | POA: Diagnosis not present

## 2024-03-10 DIAGNOSIS — N2581 Secondary hyperparathyroidism of renal origin: Secondary | ICD-10-CM | POA: Diagnosis not present

## 2024-03-13 DIAGNOSIS — N186 End stage renal disease: Secondary | ICD-10-CM | POA: Diagnosis not present

## 2024-03-13 DIAGNOSIS — Z992 Dependence on renal dialysis: Secondary | ICD-10-CM | POA: Diagnosis not present

## 2024-03-13 DIAGNOSIS — D631 Anemia in chronic kidney disease: Secondary | ICD-10-CM | POA: Diagnosis not present

## 2024-03-13 DIAGNOSIS — N2581 Secondary hyperparathyroidism of renal origin: Secondary | ICD-10-CM | POA: Diagnosis not present

## 2024-03-15 DIAGNOSIS — Z992 Dependence on renal dialysis: Secondary | ICD-10-CM | POA: Diagnosis not present

## 2024-03-15 DIAGNOSIS — D631 Anemia in chronic kidney disease: Secondary | ICD-10-CM | POA: Diagnosis not present

## 2024-03-15 DIAGNOSIS — N2581 Secondary hyperparathyroidism of renal origin: Secondary | ICD-10-CM | POA: Diagnosis not present

## 2024-03-15 DIAGNOSIS — N186 End stage renal disease: Secondary | ICD-10-CM | POA: Diagnosis not present

## 2024-03-17 DIAGNOSIS — D631 Anemia in chronic kidney disease: Secondary | ICD-10-CM | POA: Diagnosis not present

## 2024-03-17 DIAGNOSIS — N2581 Secondary hyperparathyroidism of renal origin: Secondary | ICD-10-CM | POA: Diagnosis not present

## 2024-03-17 DIAGNOSIS — N186 End stage renal disease: Secondary | ICD-10-CM | POA: Diagnosis not present

## 2024-03-17 DIAGNOSIS — Z992 Dependence on renal dialysis: Secondary | ICD-10-CM | POA: Diagnosis not present

## 2024-03-19 DIAGNOSIS — Q612 Polycystic kidney, adult type: Secondary | ICD-10-CM | POA: Diagnosis not present

## 2024-03-19 DIAGNOSIS — N186 End stage renal disease: Secondary | ICD-10-CM | POA: Diagnosis not present

## 2024-03-19 DIAGNOSIS — Z992 Dependence on renal dialysis: Secondary | ICD-10-CM | POA: Diagnosis not present

## 2024-03-20 DIAGNOSIS — N2581 Secondary hyperparathyroidism of renal origin: Secondary | ICD-10-CM | POA: Diagnosis not present

## 2024-03-20 DIAGNOSIS — N186 End stage renal disease: Secondary | ICD-10-CM | POA: Diagnosis not present

## 2024-03-20 DIAGNOSIS — Z992 Dependence on renal dialysis: Secondary | ICD-10-CM | POA: Diagnosis not present

## 2024-03-20 DIAGNOSIS — D631 Anemia in chronic kidney disease: Secondary | ICD-10-CM | POA: Diagnosis not present

## 2024-03-22 DIAGNOSIS — E119 Type 2 diabetes mellitus without complications: Secondary | ICD-10-CM | POA: Diagnosis not present

## 2024-03-22 DIAGNOSIS — D631 Anemia in chronic kidney disease: Secondary | ICD-10-CM | POA: Diagnosis not present

## 2024-03-22 DIAGNOSIS — N186 End stage renal disease: Secondary | ICD-10-CM | POA: Diagnosis not present

## 2024-03-22 DIAGNOSIS — N2581 Secondary hyperparathyroidism of renal origin: Secondary | ICD-10-CM | POA: Diagnosis not present

## 2024-03-22 DIAGNOSIS — Z992 Dependence on renal dialysis: Secondary | ICD-10-CM | POA: Diagnosis not present

## 2024-03-24 DIAGNOSIS — N2581 Secondary hyperparathyroidism of renal origin: Secondary | ICD-10-CM | POA: Diagnosis not present

## 2024-03-24 DIAGNOSIS — N186 End stage renal disease: Secondary | ICD-10-CM | POA: Diagnosis not present

## 2024-03-24 DIAGNOSIS — Z992 Dependence on renal dialysis: Secondary | ICD-10-CM | POA: Diagnosis not present

## 2024-03-24 DIAGNOSIS — D631 Anemia in chronic kidney disease: Secondary | ICD-10-CM | POA: Diagnosis not present

## 2024-03-27 DIAGNOSIS — N2581 Secondary hyperparathyroidism of renal origin: Secondary | ICD-10-CM | POA: Diagnosis not present

## 2024-03-27 DIAGNOSIS — Z992 Dependence on renal dialysis: Secondary | ICD-10-CM | POA: Diagnosis not present

## 2024-03-27 DIAGNOSIS — N186 End stage renal disease: Secondary | ICD-10-CM | POA: Diagnosis not present

## 2024-03-27 DIAGNOSIS — D631 Anemia in chronic kidney disease: Secondary | ICD-10-CM | POA: Diagnosis not present

## 2024-03-29 DIAGNOSIS — N2581 Secondary hyperparathyroidism of renal origin: Secondary | ICD-10-CM | POA: Diagnosis not present

## 2024-03-29 DIAGNOSIS — Z992 Dependence on renal dialysis: Secondary | ICD-10-CM | POA: Diagnosis not present

## 2024-03-29 DIAGNOSIS — D631 Anemia in chronic kidney disease: Secondary | ICD-10-CM | POA: Diagnosis not present

## 2024-03-29 DIAGNOSIS — N186 End stage renal disease: Secondary | ICD-10-CM | POA: Diagnosis not present

## 2024-03-31 DIAGNOSIS — Z992 Dependence on renal dialysis: Secondary | ICD-10-CM | POA: Diagnosis not present

## 2024-03-31 DIAGNOSIS — N2581 Secondary hyperparathyroidism of renal origin: Secondary | ICD-10-CM | POA: Diagnosis not present

## 2024-03-31 DIAGNOSIS — N186 End stage renal disease: Secondary | ICD-10-CM | POA: Diagnosis not present

## 2024-03-31 DIAGNOSIS — D631 Anemia in chronic kidney disease: Secondary | ICD-10-CM | POA: Diagnosis not present

## 2024-04-03 DIAGNOSIS — N186 End stage renal disease: Secondary | ICD-10-CM | POA: Diagnosis not present

## 2024-04-03 DIAGNOSIS — Z992 Dependence on renal dialysis: Secondary | ICD-10-CM | POA: Diagnosis not present

## 2024-04-03 DIAGNOSIS — N2581 Secondary hyperparathyroidism of renal origin: Secondary | ICD-10-CM | POA: Diagnosis not present

## 2024-04-03 DIAGNOSIS — D631 Anemia in chronic kidney disease: Secondary | ICD-10-CM | POA: Diagnosis not present

## 2024-04-05 DIAGNOSIS — Z992 Dependence on renal dialysis: Secondary | ICD-10-CM | POA: Diagnosis not present

## 2024-04-05 DIAGNOSIS — N186 End stage renal disease: Secondary | ICD-10-CM | POA: Diagnosis not present

## 2024-04-05 DIAGNOSIS — D631 Anemia in chronic kidney disease: Secondary | ICD-10-CM | POA: Diagnosis not present

## 2024-04-05 DIAGNOSIS — N2581 Secondary hyperparathyroidism of renal origin: Secondary | ICD-10-CM | POA: Diagnosis not present

## 2024-04-07 DIAGNOSIS — Z992 Dependence on renal dialysis: Secondary | ICD-10-CM | POA: Diagnosis not present

## 2024-04-07 DIAGNOSIS — N186 End stage renal disease: Secondary | ICD-10-CM | POA: Diagnosis not present

## 2024-04-07 DIAGNOSIS — D631 Anemia in chronic kidney disease: Secondary | ICD-10-CM | POA: Diagnosis not present

## 2024-04-07 DIAGNOSIS — N2581 Secondary hyperparathyroidism of renal origin: Secondary | ICD-10-CM | POA: Diagnosis not present

## 2024-04-10 DIAGNOSIS — N186 End stage renal disease: Secondary | ICD-10-CM | POA: Diagnosis not present

## 2024-04-10 DIAGNOSIS — D631 Anemia in chronic kidney disease: Secondary | ICD-10-CM | POA: Diagnosis not present

## 2024-04-10 DIAGNOSIS — N2581 Secondary hyperparathyroidism of renal origin: Secondary | ICD-10-CM | POA: Diagnosis not present

## 2024-04-10 DIAGNOSIS — Z992 Dependence on renal dialysis: Secondary | ICD-10-CM | POA: Diagnosis not present

## 2024-04-12 DIAGNOSIS — Z992 Dependence on renal dialysis: Secondary | ICD-10-CM | POA: Diagnosis not present

## 2024-04-12 DIAGNOSIS — N186 End stage renal disease: Secondary | ICD-10-CM | POA: Diagnosis not present

## 2024-04-12 DIAGNOSIS — N2581 Secondary hyperparathyroidism of renal origin: Secondary | ICD-10-CM | POA: Diagnosis not present

## 2024-04-12 DIAGNOSIS — D631 Anemia in chronic kidney disease: Secondary | ICD-10-CM | POA: Diagnosis not present

## 2024-04-14 DIAGNOSIS — D631 Anemia in chronic kidney disease: Secondary | ICD-10-CM | POA: Diagnosis not present

## 2024-04-14 DIAGNOSIS — N2581 Secondary hyperparathyroidism of renal origin: Secondary | ICD-10-CM | POA: Diagnosis not present

## 2024-04-14 DIAGNOSIS — N186 End stage renal disease: Secondary | ICD-10-CM | POA: Diagnosis not present

## 2024-04-14 DIAGNOSIS — Z992 Dependence on renal dialysis: Secondary | ICD-10-CM | POA: Diagnosis not present

## 2024-04-17 DIAGNOSIS — D631 Anemia in chronic kidney disease: Secondary | ICD-10-CM | POA: Diagnosis not present

## 2024-04-17 DIAGNOSIS — N2581 Secondary hyperparathyroidism of renal origin: Secondary | ICD-10-CM | POA: Diagnosis not present

## 2024-04-17 DIAGNOSIS — N186 End stage renal disease: Secondary | ICD-10-CM | POA: Diagnosis not present

## 2024-04-17 DIAGNOSIS — Z992 Dependence on renal dialysis: Secondary | ICD-10-CM | POA: Diagnosis not present

## 2024-04-18 DIAGNOSIS — Z992 Dependence on renal dialysis: Secondary | ICD-10-CM | POA: Diagnosis not present

## 2024-04-18 DIAGNOSIS — N186 End stage renal disease: Secondary | ICD-10-CM | POA: Diagnosis not present

## 2024-04-18 DIAGNOSIS — Q612 Polycystic kidney, adult type: Secondary | ICD-10-CM | POA: Diagnosis not present

## 2024-04-19 DIAGNOSIS — E119 Type 2 diabetes mellitus without complications: Secondary | ICD-10-CM | POA: Diagnosis not present

## 2024-04-19 DIAGNOSIS — N2581 Secondary hyperparathyroidism of renal origin: Secondary | ICD-10-CM | POA: Diagnosis not present

## 2024-04-19 DIAGNOSIS — Z992 Dependence on renal dialysis: Secondary | ICD-10-CM | POA: Diagnosis not present

## 2024-04-19 DIAGNOSIS — D631 Anemia in chronic kidney disease: Secondary | ICD-10-CM | POA: Diagnosis not present

## 2024-04-19 DIAGNOSIS — N186 End stage renal disease: Secondary | ICD-10-CM | POA: Diagnosis not present

## 2024-04-21 DIAGNOSIS — N186 End stage renal disease: Secondary | ICD-10-CM | POA: Diagnosis not present

## 2024-04-21 DIAGNOSIS — D631 Anemia in chronic kidney disease: Secondary | ICD-10-CM | POA: Diagnosis not present

## 2024-04-21 DIAGNOSIS — E119 Type 2 diabetes mellitus without complications: Secondary | ICD-10-CM | POA: Diagnosis not present

## 2024-04-21 DIAGNOSIS — Z992 Dependence on renal dialysis: Secondary | ICD-10-CM | POA: Diagnosis not present

## 2024-04-21 DIAGNOSIS — N2581 Secondary hyperparathyroidism of renal origin: Secondary | ICD-10-CM | POA: Diagnosis not present

## 2024-04-22 DIAGNOSIS — Z23 Encounter for immunization: Secondary | ICD-10-CM | POA: Diagnosis not present

## 2024-04-24 DIAGNOSIS — E119 Type 2 diabetes mellitus without complications: Secondary | ICD-10-CM | POA: Diagnosis not present

## 2024-04-24 DIAGNOSIS — Z992 Dependence on renal dialysis: Secondary | ICD-10-CM | POA: Diagnosis not present

## 2024-04-24 DIAGNOSIS — D631 Anemia in chronic kidney disease: Secondary | ICD-10-CM | POA: Diagnosis not present

## 2024-04-24 DIAGNOSIS — N186 End stage renal disease: Secondary | ICD-10-CM | POA: Diagnosis not present

## 2024-04-24 DIAGNOSIS — N2581 Secondary hyperparathyroidism of renal origin: Secondary | ICD-10-CM | POA: Diagnosis not present

## 2024-04-26 DIAGNOSIS — N186 End stage renal disease: Secondary | ICD-10-CM | POA: Diagnosis not present

## 2024-04-26 DIAGNOSIS — E119 Type 2 diabetes mellitus without complications: Secondary | ICD-10-CM | POA: Diagnosis not present

## 2024-04-26 DIAGNOSIS — D631 Anemia in chronic kidney disease: Secondary | ICD-10-CM | POA: Diagnosis not present

## 2024-04-26 DIAGNOSIS — N2581 Secondary hyperparathyroidism of renal origin: Secondary | ICD-10-CM | POA: Diagnosis not present

## 2024-04-26 DIAGNOSIS — Z992 Dependence on renal dialysis: Secondary | ICD-10-CM | POA: Diagnosis not present

## 2024-04-28 DIAGNOSIS — N2581 Secondary hyperparathyroidism of renal origin: Secondary | ICD-10-CM | POA: Diagnosis not present

## 2024-04-28 DIAGNOSIS — D631 Anemia in chronic kidney disease: Secondary | ICD-10-CM | POA: Diagnosis not present

## 2024-04-28 DIAGNOSIS — Z992 Dependence on renal dialysis: Secondary | ICD-10-CM | POA: Diagnosis not present

## 2024-04-28 DIAGNOSIS — N186 End stage renal disease: Secondary | ICD-10-CM | POA: Diagnosis not present

## 2024-04-28 DIAGNOSIS — E119 Type 2 diabetes mellitus without complications: Secondary | ICD-10-CM | POA: Diagnosis not present

## 2024-05-01 DIAGNOSIS — E119 Type 2 diabetes mellitus without complications: Secondary | ICD-10-CM | POA: Diagnosis not present

## 2024-05-01 DIAGNOSIS — D631 Anemia in chronic kidney disease: Secondary | ICD-10-CM | POA: Diagnosis not present

## 2024-05-01 DIAGNOSIS — Z992 Dependence on renal dialysis: Secondary | ICD-10-CM | POA: Diagnosis not present

## 2024-05-01 DIAGNOSIS — N186 End stage renal disease: Secondary | ICD-10-CM | POA: Diagnosis not present

## 2024-05-01 DIAGNOSIS — N2581 Secondary hyperparathyroidism of renal origin: Secondary | ICD-10-CM | POA: Diagnosis not present

## 2024-05-03 DIAGNOSIS — Z992 Dependence on renal dialysis: Secondary | ICD-10-CM | POA: Diagnosis not present

## 2024-05-03 DIAGNOSIS — E119 Type 2 diabetes mellitus without complications: Secondary | ICD-10-CM | POA: Diagnosis not present

## 2024-05-03 DIAGNOSIS — N2581 Secondary hyperparathyroidism of renal origin: Secondary | ICD-10-CM | POA: Diagnosis not present

## 2024-05-03 DIAGNOSIS — D631 Anemia in chronic kidney disease: Secondary | ICD-10-CM | POA: Diagnosis not present

## 2024-05-03 DIAGNOSIS — N186 End stage renal disease: Secondary | ICD-10-CM | POA: Diagnosis not present

## 2024-05-04 DIAGNOSIS — Z0000 Encounter for general adult medical examination without abnormal findings: Secondary | ICD-10-CM | POA: Diagnosis not present

## 2024-05-04 DIAGNOSIS — N186 End stage renal disease: Secondary | ICD-10-CM | POA: Diagnosis not present

## 2024-05-04 DIAGNOSIS — E785 Hyperlipidemia, unspecified: Secondary | ICD-10-CM | POA: Diagnosis not present

## 2024-05-04 DIAGNOSIS — E782 Mixed hyperlipidemia: Secondary | ICD-10-CM | POA: Diagnosis not present

## 2024-05-04 DIAGNOSIS — Z992 Dependence on renal dialysis: Secondary | ICD-10-CM | POA: Diagnosis not present

## 2024-05-04 DIAGNOSIS — E1122 Type 2 diabetes mellitus with diabetic chronic kidney disease: Secondary | ICD-10-CM | POA: Diagnosis not present

## 2024-05-04 DIAGNOSIS — I4891 Unspecified atrial fibrillation: Secondary | ICD-10-CM | POA: Diagnosis not present

## 2024-05-04 DIAGNOSIS — Z79899 Other long term (current) drug therapy: Secondary | ICD-10-CM | POA: Diagnosis not present

## 2024-05-04 DIAGNOSIS — I120 Hypertensive chronic kidney disease with stage 5 chronic kidney disease or end stage renal disease: Secondary | ICD-10-CM | POA: Diagnosis not present

## 2024-05-05 DIAGNOSIS — N2581 Secondary hyperparathyroidism of renal origin: Secondary | ICD-10-CM | POA: Diagnosis not present

## 2024-05-05 DIAGNOSIS — E119 Type 2 diabetes mellitus without complications: Secondary | ICD-10-CM | POA: Diagnosis not present

## 2024-05-05 DIAGNOSIS — D631 Anemia in chronic kidney disease: Secondary | ICD-10-CM | POA: Diagnosis not present

## 2024-05-05 DIAGNOSIS — Z992 Dependence on renal dialysis: Secondary | ICD-10-CM | POA: Diagnosis not present

## 2024-05-05 DIAGNOSIS — N186 End stage renal disease: Secondary | ICD-10-CM | POA: Diagnosis not present

## 2024-05-08 DIAGNOSIS — Z992 Dependence on renal dialysis: Secondary | ICD-10-CM | POA: Diagnosis not present

## 2024-05-08 DIAGNOSIS — N2581 Secondary hyperparathyroidism of renal origin: Secondary | ICD-10-CM | POA: Diagnosis not present

## 2024-05-08 DIAGNOSIS — D631 Anemia in chronic kidney disease: Secondary | ICD-10-CM | POA: Diagnosis not present

## 2024-05-08 DIAGNOSIS — N186 End stage renal disease: Secondary | ICD-10-CM | POA: Diagnosis not present

## 2024-05-08 DIAGNOSIS — E119 Type 2 diabetes mellitus without complications: Secondary | ICD-10-CM | POA: Diagnosis not present

## 2024-05-09 ENCOUNTER — Other Ambulatory Visit (INDEPENDENT_AMBULATORY_CARE_PROVIDER_SITE_OTHER): Payer: Self-pay | Admitting: Nurse Practitioner

## 2024-05-09 DIAGNOSIS — N186 End stage renal disease: Secondary | ICD-10-CM

## 2024-05-10 DIAGNOSIS — E119 Type 2 diabetes mellitus without complications: Secondary | ICD-10-CM | POA: Diagnosis not present

## 2024-05-10 DIAGNOSIS — Z992 Dependence on renal dialysis: Secondary | ICD-10-CM | POA: Diagnosis not present

## 2024-05-10 DIAGNOSIS — D631 Anemia in chronic kidney disease: Secondary | ICD-10-CM | POA: Diagnosis not present

## 2024-05-10 DIAGNOSIS — N186 End stage renal disease: Secondary | ICD-10-CM | POA: Diagnosis not present

## 2024-05-10 DIAGNOSIS — N2581 Secondary hyperparathyroidism of renal origin: Secondary | ICD-10-CM | POA: Diagnosis not present

## 2024-05-11 ENCOUNTER — Ambulatory Visit (INDEPENDENT_AMBULATORY_CARE_PROVIDER_SITE_OTHER)

## 2024-05-11 ENCOUNTER — Ambulatory Visit (INDEPENDENT_AMBULATORY_CARE_PROVIDER_SITE_OTHER): Admitting: Nurse Practitioner

## 2024-05-11 ENCOUNTER — Encounter (INDEPENDENT_AMBULATORY_CARE_PROVIDER_SITE_OTHER): Payer: Self-pay | Admitting: Nurse Practitioner

## 2024-05-11 VITALS — BP 99/63 | HR 89 | Resp 16 | Ht 72.0 in | Wt 161.4 lb

## 2024-05-11 DIAGNOSIS — N186 End stage renal disease: Secondary | ICD-10-CM

## 2024-05-11 DIAGNOSIS — E782 Mixed hyperlipidemia: Secondary | ICD-10-CM

## 2024-05-11 DIAGNOSIS — I10 Essential (primary) hypertension: Secondary | ICD-10-CM | POA: Diagnosis not present

## 2024-05-12 DIAGNOSIS — E119 Type 2 diabetes mellitus without complications: Secondary | ICD-10-CM | POA: Diagnosis not present

## 2024-05-12 DIAGNOSIS — D631 Anemia in chronic kidney disease: Secondary | ICD-10-CM | POA: Diagnosis not present

## 2024-05-12 DIAGNOSIS — N186 End stage renal disease: Secondary | ICD-10-CM | POA: Diagnosis not present

## 2024-05-12 DIAGNOSIS — Z992 Dependence on renal dialysis: Secondary | ICD-10-CM | POA: Diagnosis not present

## 2024-05-12 DIAGNOSIS — N2581 Secondary hyperparathyroidism of renal origin: Secondary | ICD-10-CM | POA: Diagnosis not present

## 2024-05-15 ENCOUNTER — Encounter (INDEPENDENT_AMBULATORY_CARE_PROVIDER_SITE_OTHER): Payer: Self-pay | Admitting: Nurse Practitioner

## 2024-05-15 DIAGNOSIS — N2581 Secondary hyperparathyroidism of renal origin: Secondary | ICD-10-CM | POA: Diagnosis not present

## 2024-05-15 DIAGNOSIS — N186 End stage renal disease: Secondary | ICD-10-CM | POA: Diagnosis not present

## 2024-05-15 DIAGNOSIS — D631 Anemia in chronic kidney disease: Secondary | ICD-10-CM | POA: Diagnosis not present

## 2024-05-15 DIAGNOSIS — Z992 Dependence on renal dialysis: Secondary | ICD-10-CM | POA: Diagnosis not present

## 2024-05-15 DIAGNOSIS — E119 Type 2 diabetes mellitus without complications: Secondary | ICD-10-CM | POA: Diagnosis not present

## 2024-05-15 NOTE — Progress Notes (Signed)
 Subjective:    Patient ID: Mark Bauer, male    DOB: 16-Aug-1955, 68 y.o.   MRN: [id number] Chief Complaint  Patient presents with   Follow-up    3 months + HDA Pt concern of bleeding with dialysis, states techs advise him he may need a fistula gram     The patient returns to the office for followup status post intervention of their left basilic vein transposition on 01/11/2024.  Following the intervention the access function has significantly improved, with better flow rates and improved KT/V. The patient has not been experiencing increased bleeding times following decannulation and the patient denies increased recirculation. The patient denies an increase in arm swelling. At the present time the patient denies hand pain.  No recent shortening of the patient's walking distance or new symptoms consistent with claudication.  No history of rest pain symptoms. No new ulcers or wounds of the lower extremities have occurred.  The patient denies amaurosis fugax or recent TIA symptoms. There are no recent neurological changes noted. There is no history of DVT, PE or superficial thrombophlebitis. No recent episodes of angina or shortness of breath documented.   Duplex ultrasound of the AV access shows a patent access.  The previously noted stenosis is improved compared to last study.  Flow volume today is 2347 cc/min (previous flow volume was 208 cc/min)       Review of Systems  Cardiovascular:  Negative for leg swelling.  Hematological:  Bruises/bleeds easily.  All other systems reviewed and are negative.      Objective:   Physical Exam Vitals reviewed.  HENT:     Head: Normocephalic.  Cardiovascular:     Rate and Rhythm: Normal rate.     Pulses: Normal pulses.  Pulmonary:     Effort: Pulmonary effort is normal.  Musculoskeletal:     Right lower leg: No edema.     Left lower leg: No edema.  Skin:    General: Skin is warm and dry.  Neurological:     Mental Status: He is  alert and oriented to person, place, and time.  Psychiatric:        Mood and Affect: Mood normal.        Behavior: Behavior normal.        Thought Content: Thought content normal.        Judgment: Judgment normal.     BP 99/63   Pulse 89   Resp 16   Ht 6' (1.829 m)   Wt 161 lb 6.4 oz (73.2 kg)   BMI 21.89 kg/m   Past Medical History:  Diagnosis Date   Allergy    Bilateral undescended testicles    Cancer (HCC)    melanoma on forehead   Cataract    OU   Diabetes mellitus without complication (HCC)    Dysrhythmia    a fib   GERD (gastroesophageal reflux disease)    Gout    Hepatitis    polycystic    Hypertension    Hypokalemia    Mixed hyperlipidemia    Polycystic kidney    Polycystic liver disease    Vitamin D  deficiency     Social History   Socioeconomic History   Marital status: Single    Spouse name: Not on file   Number of children: 0   Years of education: Not on file   Highest education level: Associate degree: occupational, scientist, product/process development, or vocational program  Occupational History   Occupation: retired  Tobacco Use  Smoking status: Never   Smokeless tobacco: Never  Vaping Use   Vaping status: Never Used  Substance and Sexual Activity   Alcohol use: No   Drug use: No   Sexual activity: Never  Other Topics Concern   Not on file  Social History Narrative   Never married, no children, lives alone.   Social Drivers of Corporate Investment Banker Strain: Low Risk  (03/09/2024)   Received from [facility]   Overall Financial Resource Strain (CARDIA)    Difficulty of Paying Living Expenses: Not hard at all  Food Insecurity: No Food Insecurity (03/09/2024)   Received from [facility]   Hunger Vital Sign    Within the past 12 months, you worried that your food would run out before you got the money to buy more.: Never true    Within the past 12 months, the food you bought just didn't last and you didn't have money to  get more.: Never true  Transportation Needs: No Transportation Needs (03/09/2024)   Received from [facility] - Transportation    In the past 12 months, has lack of transportation kept you from medical appointments or from getting medications?: No    Lack of Transportation (Non-Medical): No  Physical Activity: Insufficiently Active (07/13/2023)   Exercise Vital Sign    Days of Exercise per Week: 2 days    Minutes of Exercise per Session: 20 min  Stress: No Stress Concern Present (07/13/2023)   [employer] of Occupational Health - Occupational Stress Questionnaire    Feeling of Stress : Not at all  Social Connections: Unknown (07/13/2023)   Social Connection and Isolation Panel    Frequency of Communication with Friends and Family: Once a week    Frequency of Social Gatherings with Friends and Family: Patient declined    Attends Religious Services: Never    Database Administrator or Organizations: No    Attends Banker Meetings: Never    Marital Status: Never married  Intimate Partner Violence: Not At Risk (01/28/2023)   Humiliation, Afraid, Rape, and Kick questionnaire    Fear of Current or Ex-Partner: No    Emotionally Abused: No    Physically Abused: No    Sexually Abused: No    Past Surgical History:  Procedure Laterality Date   A/V FISTULAGRAM Left 12/15/2022   Procedure: A/V Fistulagram;  Surgeon: Jama Cordella MATSU, MD;  Location: [facility] INVASIVE CV LAB;  Service: Cardiovascular;  Laterality: Left;   A/V FISTULAGRAM Left 01/11/2024   Procedure: A/V Fistulagram;  Surgeon: Jama Cordella MATSU, MD;  Location: [facility] INVASIVE CV LAB;  Service: Cardiovascular;  Laterality: Left;   BASCILIC VEIN TRANSPOSITION Left 02/23/2020   Procedure: BASCILIC VEIN TRANSPOSITION (SINGLE STAGE);  Surgeon: Jama Cordella MATSU, MD;  Location: [facility] ORS;  Service: Vascular;  Laterality: Left;   COLONOSCOPY  2012   hemorrhoids   melanoma removal     forehead     Family History  Problem Relation Age of Onset   Diabetes Mother    Heart disease Father     Allergies  Allergen Reactions   Ciprofloxacin Other (See Comments)    hallucination  Other Reaction(s): Unknown  Other Reaction(s): Hallucination  hallucination  Other Reaction(s): Unknown   Pollen Extract Other (See Comments)    Sneezing, watery eyes, etc.  Other Reaction(s): Unknown   Caffeine Palpitations    palpatations       Latest Ref Rng &  Units 11/02/2023   10:03 AM 09/12/2021   10:36 AM 02/16/2020    2:34 PM  CBC  WBC 3.4 - 10.8 x10E3/uL 4.6  5.9  6.2   Hemoglobin 13.0 - 17.7 g/dL 88.7  89.9  87.3   Hematocrit 37.5 - 51.0 % 32.6  30.8  37.1   Platelets 150 - 450 x10E3/uL 109  119  157       CMP     Component Value Date/Time   NA 142 11/02/2023 1003   K 4.2 11/02/2023 1003   CL 94 (L) 11/02/2023 1003   CO2 28 11/02/2023 1003   GLUCOSE 94 11/02/2023 1003   GLUCOSE 109 (H) 02/16/2020 1434   BUN 35 (H) 11/02/2023 1003   CREATININE 5.98 (H) 11/02/2023 1003   CALCIUM 10.4 (H) 11/02/2023 1003   PROT 6.2 11/02/2023 1003   ALBUMIN 4.4 11/02/2023 1003   AST 20 11/02/2023 1003   ALT 13 11/02/2023 1003   ALKPHOS 96 11/02/2023 1003   BILITOT 0.5 11/02/2023 1003   EGFR 10 (L) 11/02/2023 1003   GFRNONAA 8 (L) 02/16/2020 1434     No results found.     Assessment & Plan:   1. End stage renal disease (HCC) (Primary) Recommend:  The patient is doing well and currently has adequate dialysis access.  Although there are some parameters suggesting possible future issues.  He still some bleeding but it is much improved.  The patient's dialysis center is not reporting any major access issues.  This raises concerns that the access is at moderate but not high risk for a problem or thrombosis and should be followed more closely  The patient will follow-up with me in the office in 3 months.  The need for a follow up duplex ultrasound will be made at that time based on  whether problems with the access are persistent.    2. Essential hypertension Continue antihypertensive medications as already ordered, these medications have been reviewed and there are no changes at this time.  3. Mixed hyperlipidemia Continue statin as ordered and reviewed, no changes at this time   Current Outpatient Medications on File Prior to Visit  Medication Sig Dispense Refill   allopurinol  (ZYLOPRIM ) 100 MG tablet Take 1 tablet (100 mg total) by mouth daily. 90 tablet 1   apixaban  (ELIQUIS ) 2.5 MG TABS tablet Take 1 tablet (2.5 mg total) by mouth 2 (two) times daily. 60 tablet 6   blood glucose meter kit and supplies Dispense based on patient and insurance preference. Use up to four times daily as directed. (FOR ICD-10 E11.9). 1 each 0   cinacalcet (SENSIPAR) 30 MG tablet Take 30 mg by mouth once a week. 3 x week     Dulaglutide  (TRULICITY ) 0.75 MG/0.5ML SOAJ INJECT 0.75 MG SUBCUTANEOUSLY ONE TIME PER WEEK 6 mL 4   furosemide  (LASIX ) 20 MG tablet Take 1 tablet (20 mg total) by mouth 2 (two) times daily. 180 tablet 1   IRON SUCROSE  IV Inject 50 mg into the vein. Weekly     Lancets (ONETOUCH DELICA PLUS LANCET30G) MISC USE AS DIRECTED EVERY DAY     loratadine (CLARITIN) 10 MG tablet Take 10 mg by mouth at bedtime. otc     lovastatin  (MEVACOR ) 20 MG tablet Take 1 tablet (20 mg total) by mouth daily. 90 tablet 1   Methoxy PEG-Epoetin  Beta (MIRCERA) 30 MCG/0.3ML SOSY Inject 30 mcg as directed every 30 (thirty) days.     ONETOUCH ULTRA test strip ONE TEST DAILY  100 strip 0   pantoprazole  (PROTONIX ) 40 MG tablet Take 1 tablet (40 mg total) by mouth daily. 90 tablet 1   sevelamer carbonate (RENVELA) 800 MG tablet Take 800 mg by mouth 3 (three) times daily with meals.     spironolactone (ALDACTONE) 25 MG tablet Take 25 mg by mouth daily.     amoxicillin-clavulanate (AUGMENTIN) 500-125 MG tablet Take 500 mg by mouth daily. (Patient not taking: Reported on 11/02/2023)     calcitRIOL  (ROCALTROL) 0.5 MCG capsule Take 0.5 mcg by mouth once a week. 3x week     cyanocobalamin  1000 MCG tablet Take 1,000 mcg by mouth daily. (Patient not taking: Reported on 02/01/2024)     No current facility-administered medications on file prior to visit.    There are no Patient Instructions on file for this visit. No follow-ups on file.   Everly Rubalcava E Zimere Dunlevy, NP

## 2024-05-17 DIAGNOSIS — N186 End stage renal disease: Secondary | ICD-10-CM | POA: Diagnosis not present

## 2024-05-17 DIAGNOSIS — Z992 Dependence on renal dialysis: Secondary | ICD-10-CM | POA: Diagnosis not present

## 2024-05-17 DIAGNOSIS — N2581 Secondary hyperparathyroidism of renal origin: Secondary | ICD-10-CM | POA: Diagnosis not present

## 2024-05-17 DIAGNOSIS — E119 Type 2 diabetes mellitus without complications: Secondary | ICD-10-CM | POA: Diagnosis not present

## 2024-05-17 DIAGNOSIS — D631 Anemia in chronic kidney disease: Secondary | ICD-10-CM | POA: Diagnosis not present

## 2024-05-19 DIAGNOSIS — N2581 Secondary hyperparathyroidism of renal origin: Secondary | ICD-10-CM | POA: Diagnosis not present

## 2024-05-19 DIAGNOSIS — D631 Anemia in chronic kidney disease: Secondary | ICD-10-CM | POA: Diagnosis not present

## 2024-05-19 DIAGNOSIS — Z992 Dependence on renal dialysis: Secondary | ICD-10-CM | POA: Diagnosis not present

## 2024-05-19 DIAGNOSIS — N186 End stage renal disease: Secondary | ICD-10-CM | POA: Diagnosis not present

## 2024-05-19 DIAGNOSIS — Q612 Polycystic kidney, adult type: Secondary | ICD-10-CM | POA: Diagnosis not present

## 2024-05-19 DIAGNOSIS — E119 Type 2 diabetes mellitus without complications: Secondary | ICD-10-CM | POA: Diagnosis not present

## 2024-05-22 DIAGNOSIS — N186 End stage renal disease: Secondary | ICD-10-CM | POA: Diagnosis not present

## 2024-05-22 DIAGNOSIS — N2581 Secondary hyperparathyroidism of renal origin: Secondary | ICD-10-CM | POA: Diagnosis not present

## 2024-05-22 DIAGNOSIS — Z992 Dependence on renal dialysis: Secondary | ICD-10-CM | POA: Diagnosis not present

## 2024-05-22 DIAGNOSIS — D631 Anemia in chronic kidney disease: Secondary | ICD-10-CM | POA: Diagnosis not present

## 2024-05-22 DIAGNOSIS — E119 Type 2 diabetes mellitus without complications: Secondary | ICD-10-CM | POA: Diagnosis not present

## 2024-05-24 DIAGNOSIS — N2581 Secondary hyperparathyroidism of renal origin: Secondary | ICD-10-CM | POA: Diagnosis not present

## 2024-05-24 DIAGNOSIS — Z992 Dependence on renal dialysis: Secondary | ICD-10-CM | POA: Diagnosis not present

## 2024-05-24 DIAGNOSIS — N186 End stage renal disease: Secondary | ICD-10-CM | POA: Diagnosis not present

## 2024-05-24 DIAGNOSIS — D631 Anemia in chronic kidney disease: Secondary | ICD-10-CM | POA: Diagnosis not present

## 2024-05-24 DIAGNOSIS — E119 Type 2 diabetes mellitus without complications: Secondary | ICD-10-CM | POA: Diagnosis not present

## 2024-05-26 DIAGNOSIS — N2581 Secondary hyperparathyroidism of renal origin: Secondary | ICD-10-CM | POA: Diagnosis not present

## 2024-05-26 DIAGNOSIS — E119 Type 2 diabetes mellitus without complications: Secondary | ICD-10-CM | POA: Diagnosis not present

## 2024-05-26 DIAGNOSIS — N186 End stage renal disease: Secondary | ICD-10-CM | POA: Diagnosis not present

## 2024-05-26 DIAGNOSIS — D631 Anemia in chronic kidney disease: Secondary | ICD-10-CM | POA: Diagnosis not present

## 2024-05-26 DIAGNOSIS — Z992 Dependence on renal dialysis: Secondary | ICD-10-CM | POA: Diagnosis not present

## 2024-05-29 DIAGNOSIS — Z992 Dependence on renal dialysis: Secondary | ICD-10-CM | POA: Diagnosis not present

## 2024-05-29 DIAGNOSIS — E119 Type 2 diabetes mellitus without complications: Secondary | ICD-10-CM | POA: Diagnosis not present

## 2024-05-29 DIAGNOSIS — N2581 Secondary hyperparathyroidism of renal origin: Secondary | ICD-10-CM | POA: Diagnosis not present

## 2024-05-29 DIAGNOSIS — N186 End stage renal disease: Secondary | ICD-10-CM | POA: Diagnosis not present

## 2024-05-29 DIAGNOSIS — D631 Anemia in chronic kidney disease: Secondary | ICD-10-CM | POA: Diagnosis not present

## 2024-05-30 ENCOUNTER — Encounter (INDEPENDENT_AMBULATORY_CARE_PROVIDER_SITE_OTHER): Payer: Medicare Other

## 2024-05-30 ENCOUNTER — Other Ambulatory Visit (INDEPENDENT_AMBULATORY_CARE_PROVIDER_SITE_OTHER): Payer: Self-pay | Admitting: Nurse Practitioner

## 2024-05-30 ENCOUNTER — Ambulatory Visit (INDEPENDENT_AMBULATORY_CARE_PROVIDER_SITE_OTHER): Payer: BLUE CROSS/BLUE SHIELD | Admitting: Nurse Practitioner

## 2024-05-31 DIAGNOSIS — E119 Type 2 diabetes mellitus without complications: Secondary | ICD-10-CM | POA: Diagnosis not present

## 2024-05-31 DIAGNOSIS — N186 End stage renal disease: Secondary | ICD-10-CM | POA: Diagnosis not present

## 2024-05-31 DIAGNOSIS — Z992 Dependence on renal dialysis: Secondary | ICD-10-CM | POA: Diagnosis not present

## 2024-05-31 DIAGNOSIS — N2581 Secondary hyperparathyroidism of renal origin: Secondary | ICD-10-CM | POA: Diagnosis not present

## 2024-05-31 DIAGNOSIS — D631 Anemia in chronic kidney disease: Secondary | ICD-10-CM | POA: Diagnosis not present

## 2024-06-02 DIAGNOSIS — D631 Anemia in chronic kidney disease: Secondary | ICD-10-CM | POA: Diagnosis not present

## 2024-06-02 DIAGNOSIS — N2581 Secondary hyperparathyroidism of renal origin: Secondary | ICD-10-CM | POA: Diagnosis not present

## 2024-06-02 DIAGNOSIS — Z992 Dependence on renal dialysis: Secondary | ICD-10-CM | POA: Diagnosis not present

## 2024-06-02 DIAGNOSIS — E119 Type 2 diabetes mellitus without complications: Secondary | ICD-10-CM | POA: Diagnosis not present

## 2024-06-02 DIAGNOSIS — N186 End stage renal disease: Secondary | ICD-10-CM | POA: Diagnosis not present

## 2024-06-05 DIAGNOSIS — D631 Anemia in chronic kidney disease: Secondary | ICD-10-CM | POA: Diagnosis not present

## 2024-06-05 DIAGNOSIS — N2581 Secondary hyperparathyroidism of renal origin: Secondary | ICD-10-CM | POA: Diagnosis not present

## 2024-06-05 DIAGNOSIS — E119 Type 2 diabetes mellitus without complications: Secondary | ICD-10-CM | POA: Diagnosis not present

## 2024-06-05 DIAGNOSIS — N186 End stage renal disease: Secondary | ICD-10-CM | POA: Diagnosis not present

## 2024-06-05 DIAGNOSIS — Z992 Dependence on renal dialysis: Secondary | ICD-10-CM | POA: Diagnosis not present

## 2024-06-07 DIAGNOSIS — Z992 Dependence on renal dialysis: Secondary | ICD-10-CM | POA: Diagnosis not present

## 2024-06-07 DIAGNOSIS — N186 End stage renal disease: Secondary | ICD-10-CM | POA: Diagnosis not present

## 2024-06-07 DIAGNOSIS — E119 Type 2 diabetes mellitus without complications: Secondary | ICD-10-CM | POA: Diagnosis not present

## 2024-06-07 DIAGNOSIS — N2581 Secondary hyperparathyroidism of renal origin: Secondary | ICD-10-CM | POA: Diagnosis not present

## 2024-06-07 DIAGNOSIS — D631 Anemia in chronic kidney disease: Secondary | ICD-10-CM | POA: Diagnosis not present

## 2024-06-09 DIAGNOSIS — D631 Anemia in chronic kidney disease: Secondary | ICD-10-CM | POA: Diagnosis not present

## 2024-06-09 DIAGNOSIS — N2581 Secondary hyperparathyroidism of renal origin: Secondary | ICD-10-CM | POA: Diagnosis not present

## 2024-06-09 DIAGNOSIS — N186 End stage renal disease: Secondary | ICD-10-CM | POA: Diagnosis not present

## 2024-06-09 DIAGNOSIS — E119 Type 2 diabetes mellitus without complications: Secondary | ICD-10-CM | POA: Diagnosis not present

## 2024-06-09 DIAGNOSIS — Z992 Dependence on renal dialysis: Secondary | ICD-10-CM | POA: Diagnosis not present

## 2024-06-12 DIAGNOSIS — N186 End stage renal disease: Secondary | ICD-10-CM | POA: Diagnosis not present

## 2024-06-12 DIAGNOSIS — D631 Anemia in chronic kidney disease: Secondary | ICD-10-CM | POA: Diagnosis not present

## 2024-06-12 DIAGNOSIS — Z992 Dependence on renal dialysis: Secondary | ICD-10-CM | POA: Diagnosis not present

## 2024-06-12 DIAGNOSIS — E119 Type 2 diabetes mellitus without complications: Secondary | ICD-10-CM | POA: Diagnosis not present

## 2024-06-12 DIAGNOSIS — N2581 Secondary hyperparathyroidism of renal origin: Secondary | ICD-10-CM | POA: Diagnosis not present

## 2024-06-14 DIAGNOSIS — E119 Type 2 diabetes mellitus without complications: Secondary | ICD-10-CM | POA: Diagnosis not present

## 2024-06-14 DIAGNOSIS — Z992 Dependence on renal dialysis: Secondary | ICD-10-CM | POA: Diagnosis not present

## 2024-06-14 DIAGNOSIS — D631 Anemia in chronic kidney disease: Secondary | ICD-10-CM | POA: Diagnosis not present

## 2024-06-14 DIAGNOSIS — N186 End stage renal disease: Secondary | ICD-10-CM | POA: Diagnosis not present

## 2024-06-14 DIAGNOSIS — N2581 Secondary hyperparathyroidism of renal origin: Secondary | ICD-10-CM | POA: Diagnosis not present

## 2024-06-17 DIAGNOSIS — Z992 Dependence on renal dialysis: Secondary | ICD-10-CM | POA: Diagnosis not present

## 2024-06-17 DIAGNOSIS — D631 Anemia in chronic kidney disease: Secondary | ICD-10-CM | POA: Diagnosis not present

## 2024-06-17 DIAGNOSIS — E119 Type 2 diabetes mellitus without complications: Secondary | ICD-10-CM | POA: Diagnosis not present

## 2024-06-17 DIAGNOSIS — N186 End stage renal disease: Secondary | ICD-10-CM | POA: Diagnosis not present

## 2024-06-17 DIAGNOSIS — N2581 Secondary hyperparathyroidism of renal origin: Secondary | ICD-10-CM | POA: Diagnosis not present

## 2024-06-18 DIAGNOSIS — N186 End stage renal disease: Secondary | ICD-10-CM | POA: Diagnosis not present

## 2024-06-18 DIAGNOSIS — Z992 Dependence on renal dialysis: Secondary | ICD-10-CM | POA: Diagnosis not present

## 2024-06-18 DIAGNOSIS — Q612 Polycystic kidney, adult type: Secondary | ICD-10-CM | POA: Diagnosis not present

## 2024-08-02 ENCOUNTER — Other Ambulatory Visit (INDEPENDENT_AMBULATORY_CARE_PROVIDER_SITE_OTHER): Payer: Self-pay | Admitting: Nurse Practitioner

## 2024-08-02 DIAGNOSIS — N186 End stage renal disease: Secondary | ICD-10-CM

## 2024-08-03 ENCOUNTER — Other Ambulatory Visit (INDEPENDENT_AMBULATORY_CARE_PROVIDER_SITE_OTHER)

## 2024-08-03 ENCOUNTER — Encounter (INDEPENDENT_AMBULATORY_CARE_PROVIDER_SITE_OTHER): Payer: Self-pay | Admitting: Nurse Practitioner

## 2024-08-03 ENCOUNTER — Ambulatory Visit (INDEPENDENT_AMBULATORY_CARE_PROVIDER_SITE_OTHER): Admitting: Nurse Practitioner

## 2024-08-03 VITALS — BP 94/57 | HR 91 | Ht 72.0 in | Wt 164.0 lb

## 2024-08-03 DIAGNOSIS — E1121 Type 2 diabetes mellitus with diabetic nephropathy: Secondary | ICD-10-CM | POA: Diagnosis not present

## 2024-08-03 DIAGNOSIS — I10 Essential (primary) hypertension: Secondary | ICD-10-CM | POA: Diagnosis not present

## 2024-08-03 DIAGNOSIS — N186 End stage renal disease: Secondary | ICD-10-CM | POA: Diagnosis not present

## 2024-08-11 ENCOUNTER — Encounter (INDEPENDENT_AMBULATORY_CARE_PROVIDER_SITE_OTHER): Payer: Self-pay | Admitting: Nurse Practitioner

## 2024-08-11 NOTE — Progress Notes (Signed)
 "  Subjective:    Patient ID: Mark Bauer, male    DOB: April 20, 1956, 69 y.o.   MRN: [id number] Chief Complaint  Patient presents with   Follow-up    3 months + HDA    HPI  Discussed the use of AI scribe software for clinical note transcription with the patient, who gave verbal consent to proceed.  History of Present Illness Mark Bauer is a 69 year old male with end stage renal disease on hemodialysis via left upper extremity arteriovenous fistula who presents for evaluation of dialysis access function and delayed healing at needle stick sites.  He reports that his dialysis access is functioning adequately. Implementation of a new protocol during dialysis, involving removal of the bottom needle first, has resulted in cessation of post-dialysis bleeding at the access site. He has not experienced significant bleeding from the access site in the past one to two months.  He notes a decrease in flow volume of the access, with the most recent measurement at 1653 compared to a previous value of 2347. He denies any new symptoms related to this change.  He describes delayed healing at one of the needle stick sites, with persistent scabbing present for over two months. This site previously bled significantly a few months ago when the skin split rather than punctured. The area is not open, has not worsened, but also has not improved significantly. Dialysis staff are avoiding needling in this region.  He sustained a minor injury to his arm while putting up Christmas decorations when a box fell and he blocked it with his arm; this was unrelated to the access site. He has no other concerns or questions at this time.    Results Diagnostic Dialysis access flow volume (08/03/2024): 1653, decreased from 2347 Dialysis access ultrasound (08/03/2024): No significant stenosis, stents patent   Review of Systems  Cardiovascular:  Positive for leg swelling.  All other systems reviewed and are  negative.      Objective:   Physical Exam Vitals reviewed.  HENT:     Head: Normocephalic.  Cardiovascular:     Rate and Rhythm: Normal rate.  Pulmonary:     Effort: Pulmonary effort is normal.  Skin:    General: Skin is warm and dry.  Neurological:     Mental Status: He is alert.  Psychiatric:        Mood and Affect: Mood normal.        Behavior: Behavior normal.        Thought Content: Thought content normal.        Judgment: Judgment normal.     Physical Exam    BP (!) 94/57   Pulse 91   Ht 6' (1.829 m)   Wt 164 lb (74.4 kg)   SpO2 (!) 17%   BMI 22.24 kg/m   Past Medical History:  Diagnosis Date   Allergy    Bilateral undescended testicles    Cancer (HCC)    melanoma on forehead   Cataract    OU   Diabetes mellitus without complication (HCC)    Dysrhythmia    a fib   GERD (gastroesophageal reflux disease)    Gout    Hepatitis    polycystic    Hypertension    Hypokalemia    Mixed hyperlipidemia    Polycystic kidney    Polycystic liver disease    Vitamin D  deficiency     Social History   Socioeconomic History   Marital status: Single  Spouse name: Not on file   Number of children: 0   Years of education: Not on file   Highest education level: Associate degree: occupational, scientist, product/process development, or vocational program  Occupational History   Occupation: retired  Tobacco Use   Smoking status: Never   Smokeless tobacco: Never  Vaping Use   Vaping status: Never Used  Substance and Sexual Activity   Alcohol use: No   Drug use: No   Sexual activity: Never  Other Topics Concern   Not on file  Social History Narrative   Never married, no children, lives alone.   Social Drivers of Health   Tobacco Use: Low Risk (08/11/2024)   Patient History    Smoking Tobacco Use: Never    Smokeless Tobacco Use: Never    Passive Exposure: Not on file  Financial Resource Strain: Low Risk  (03/09/2024)   Received from [facility]   Overall  Financial Resource Strain (CARDIA)    Difficulty of Paying Living Expenses: Not hard at all  Food Insecurity: No Food Insecurity (03/09/2024)   Received from [facility]   Epic    Within the past 12 months, you worried that your food would run out before you got the money to buy more.: Never true    Within the past 12 months, the food you bought just didn't last and you didn't have money to get more.: Never true  Transportation Needs: No Transportation Needs (03/09/2024)   Received from [facility] - Transportation    In the past 12 months, has lack of transportation kept you from medical appointments or from getting medications?: No    Lack of Transportation (Non-Medical): No  Physical Activity: Insufficiently Active (07/13/2023)   Exercise Vital Sign    Days of Exercise per Week: 2 days    Minutes of Exercise per Session: 20 min  Stress: No Stress Concern Present (07/13/2023)   [employer] of Occupational Health - Occupational Stress Questionnaire    Feeling of Stress : Not at all  Social Connections: Unknown (07/13/2023)   Social Connection and Isolation Panel    Frequency of Communication with Friends and Family: Once a week    Frequency of Social Gatherings with Friends and Family: Patient declined    Attends Religious Services: Never    Database Administrator or Organizations: No    Attends Banker Meetings: Never    Marital Status: Never married  Intimate Partner Violence: Not At Risk (01/28/2023)   Humiliation, Afraid, Rape, and Kick questionnaire    Fear of Current or Ex-Partner: No    Emotionally Abused: No    Physically Abused: No    Sexually Abused: No  Depression (PHQ2-9): Low Risk (11/02/2023)   Depression (PHQ2-9)    PHQ-2 Score: 0  Alcohol Screen: Low Risk (01/28/2023)   Alcohol Screen    Last Alcohol Screening Score (AUDIT): 0  Housing: Low Risk  (03/09/2024)   Received from [facility]   Epic    In the last 12 months, was there a time when you were not able to pay the mortgage or rent on time?: No    In the past 12 months, how many times have you moved where you were living?: 0    At any time in the past 12 months, were you homeless or living in a shelter (including now)?: No  Utilities: Not At Risk (03/09/2024)   Received from [facility]   Epic    In the past 12 months has the electric, gas, oil, or water company threatened to shut off services in your home?: No  Health Literacy: Adequate Health Literacy (01/28/2023)   B1300 Health Literacy    Frequency of need for help with medical instructions: Never    Past Surgical History:  Procedure Laterality Date   A/V FISTULAGRAM Left 12/15/2022   Procedure: A/V Fistulagram;  Surgeon: Jama Cordella MATSU, MD;  Location: [facility] INVASIVE CV LAB;  Service: Cardiovascular;  Laterality: Left;   A/V FISTULAGRAM Left 01/11/2024   Procedure: A/V Fistulagram;  Surgeon: Jama Cordella MATSU, MD;  Location: [facility] INVASIVE CV LAB;  Service: Cardiovascular;  Laterality: Left;   BASCILIC VEIN TRANSPOSITION Left 02/23/2020   Procedure: BASCILIC VEIN TRANSPOSITION (SINGLE STAGE);  Surgeon: Jama Cordella MATSU, MD;  Location: [facility] ORS;  Service: Vascular;  Laterality: Left;   COLONOSCOPY  2012   hemorrhoids   melanoma removal     forehead    Family History  Problem Relation Age of Onset   Diabetes Mother    Heart disease Father     Allergies[1]     Latest Ref Rng & Units 11/02/2023   10:03 AM 09/12/2021   10:36 AM 02/16/2020    2:34 PM  CBC  WBC 3.4 - 10.8 x10E3/uL 4.6  5.9  6.2   Hemoglobin 13.0 - 17.7 g/dL 88.7  89.9  87.3   Hematocrit 37.5 - 51.0 % 32.6  30.8  37.1   Platelets 150 - 450 x10E3/uL 109  119  157       CMP     Component Value Date/Time   NA 142 11/02/2023 1003   K 4.2 11/02/2023 1003   CL 94 (L) 11/02/2023 1003   CO2 28 11/02/2023 1003   GLUCOSE 94 11/02/2023 1003   GLUCOSE 109 (H)  02/16/2020 1434   BUN 35 (H) 11/02/2023 1003   CREATININE 5.98 (H) 11/02/2023 1003   CALCIUM 10.4 (H) 11/02/2023 1003   PROT 6.2 11/02/2023 1003   ALBUMIN 4.4 11/02/2023 1003   AST 20 11/02/2023 1003   ALT 13 11/02/2023 1003   ALKPHOS 96 11/02/2023 1003   BILITOT 0.5 11/02/2023 1003   EGFR 10 (L) 11/02/2023 1003   GFRNONAA 8 (L) 02/16/2020 1434     No results found.     Assessment & Plan:   1. ESRD (end stage renal disease) (HCC) (Primary) End stage renal disease with arteriovenous fistula management Arteriovenous fistula functioning adequately with decreased flow volume but above threshold. No significant stenosis or compromised skin integrity. Discussed risk of skin breakdown and potential for fistula salvage if complications arise. - Extended follow-up interval to six months for routine vascular access evaluation. - Advised to monitor for new or worsening fistula issues, including bleeding, non-healing wounds, or changes in function, and to seek earlier evaluation if these occur. - Discussed that no effective topical agent exists to promote healing at the needle site; healing is expected with time. - Reinforced avoidance of repeated cannulation at the area of delayed healing, as already implemented by the dialysis team. - Provided reassurance regarding current fistula stability and discussed potential for salvage if complications develop. - VAS US  DUPLEX DIALYSIS ACCESS (AVF, AVG); Future  2. Essential hypertension Continue hypoglycemic medications as already ordered, these medications have been reviewed and there are no changes at this time.  Hgb A1C to be monitored as already arranged by primary service  3. Type 2 diabetes mellitus with  diabetic nephropathy, without long-term current use of insulin  (HCC) Continue hypoglycemic medications as already ordered, these medications have been reviewed and there are no changes at this time.  Hgb A1C to be monitored as already  arranged by primary service   Assessment and Plan Assessment & Plan      Medications Ordered Prior to Encounter[2]  There are no Patient Instructions on file for this visit. Return in about 6 months (around 01/31/2025) for 6 months HDA GS/FB.   Orvin FORBES Daring, NP      [1]  Allergies Allergen Reactions   Ciprofloxacin Other (See Comments)    hallucination  Other Reaction(s): Unknown  Other Reaction(s): Hallucination  hallucination  Other Reaction(s): Unknown   Pollen Extract Other (See Comments)    Sneezing, watery eyes, etc.  Other Reaction(s): Unknown   Caffeine Palpitations    palpatations  [2]  Current Outpatient Medications on File Prior to Visit  Medication Sig Dispense Refill   allopurinol  (ZYLOPRIM ) 100 MG tablet Take 1 tablet (100 mg total) by mouth daily. 90 tablet 1   blood glucose meter kit and supplies Dispense based on patient and insurance preference. Use up to four times daily as directed. (FOR ICD-10 E11.9). 1 each 0   cinacalcet (SENSIPAR) 30 MG tablet Take 30 mg by mouth once a week. 3 x week     Dulaglutide  (TRULICITY ) 0.75 MG/0.5ML SOAJ INJECT 0.75 MG SUBCUTANEOUSLY ONE TIME PER WEEK 6 mL 4   ELIQUIS  2.5 MG TABS tablet TAKE 1 TABLET BY MOUTH TWICE A DAY 60 tablet 6   furosemide  (LASIX ) 20 MG tablet Take 1 tablet (20 mg total) by mouth 2 (two) times daily. 180 tablet 1   IRON SUCROSE  IV Inject 50 mg into the vein. Weekly     Lancets (ONETOUCH DELICA PLUS LANCET30G) MISC USE AS DIRECTED EVERY DAY     loratadine (CLARITIN) 10 MG tablet Take 10 mg by mouth at bedtime. otc     lovastatin  (MEVACOR ) 20 MG tablet Take 1 tablet (20 mg total) by mouth daily. 90 tablet 1   Methoxy PEG-Epoetin  Beta (MIRCERA) 30 MCG/0.3ML SOSY Inject 30 mcg as directed every 30 (thirty) days.     ONETOUCH ULTRA test strip ONE TEST DAILY 100 strip 0   pantoprazole  (PROTONIX ) 40 MG tablet Take 1 tablet (40 mg total) by mouth daily. 90 tablet 1   sevelamer carbonate (RENVELA)  800 MG tablet Take 800 mg by mouth 3 (three) times daily with meals.     spironolactone (ALDACTONE) 25 MG tablet Take 25 mg by mouth daily.     amoxicillin-clavulanate (AUGMENTIN) 500-125 MG tablet Take 500 mg by mouth daily. (Patient not taking: Reported on 11/02/2023)     calcitRIOL (ROCALTROL) 0.5 MCG capsule Take 0.5 mcg by mouth once a week. 3x week     cyanocobalamin  1000 MCG tablet Take 1,000 mcg by mouth daily. (Patient not taking: Reported on 02/01/2024)     No current facility-administered medications on file prior to visit.   "

## 2025-01-30 ENCOUNTER — Encounter (INDEPENDENT_AMBULATORY_CARE_PROVIDER_SITE_OTHER): Admitting: Ophthalmology

## 2025-02-01 ENCOUNTER — Ambulatory Visit (INDEPENDENT_AMBULATORY_CARE_PROVIDER_SITE_OTHER): Admitting: Nurse Practitioner

## 2025-02-01 ENCOUNTER — Encounter (INDEPENDENT_AMBULATORY_CARE_PROVIDER_SITE_OTHER)
